# Patient Record
Sex: Female | Born: 1965 | Race: White | Hispanic: No | Marital: Single | Smoking: Former smoker
Health system: Southern US, Community
[De-identification: ages and names within clinical notes are randomized; demographics above are authoritative.]

## PROBLEM LIST (undated history)

## (undated) DIAGNOSIS — C801 Malignant (primary) neoplasm, unspecified: Secondary | ICD-10-CM

## (undated) DIAGNOSIS — IMO0001 Reserved for inherently not codable concepts without codable children: Secondary | ICD-10-CM

## (undated) DIAGNOSIS — IMO0002 Reserved for concepts with insufficient information to code with codable children: Secondary | ICD-10-CM

## (undated) HISTORY — DX: Reserved for inherently not codable concepts without codable children: IMO0001

## (undated) HISTORY — DX: Reserved for concepts with insufficient information to code with codable children: IMO0002

---

## 2014-09-10 ENCOUNTER — Telehealth: Payer: Self-pay | Admitting: Internal Medicine

## 2014-09-10 ENCOUNTER — Emergency Department (HOSPITAL_COMMUNITY): Payer: 59

## 2014-09-10 ENCOUNTER — Inpatient Hospital Stay (HOSPITAL_COMMUNITY)
Admission: EM | Admit: 2014-09-10 | Discharge: 2014-09-30 | DRG: 947 | Disposition: A | Discharge status: Skilled Nursing Facility | Payer: 59 | Attending: Internal Medicine | Admitting: Internal Medicine

## 2014-09-10 ENCOUNTER — Encounter (HOSPITAL_COMMUNITY): Payer: Self-pay | Admitting: *Deleted

## 2014-09-10 DIAGNOSIS — R531 Weakness: Secondary | ICD-10-CM | POA: Insufficient documentation

## 2014-09-10 DIAGNOSIS — Z7189 Other specified counseling: Secondary | ICD-10-CM

## 2014-09-10 DIAGNOSIS — Z801 Family history of malignant neoplasm of trachea, bronchus and lung: Secondary | ICD-10-CM

## 2014-09-10 DIAGNOSIS — C3490 Malignant neoplasm of unspecified part of unspecified bronchus or lung: Secondary | ICD-10-CM | POA: Diagnosis present

## 2014-09-10 DIAGNOSIS — E8770 Fluid overload, unspecified: Secondary | ICD-10-CM | POA: Diagnosis not present

## 2014-09-10 DIAGNOSIS — F17210 Nicotine dependence, cigarettes, uncomplicated: Secondary | ICD-10-CM | POA: Diagnosis present

## 2014-09-10 DIAGNOSIS — R059 Cough, unspecified: Secondary | ICD-10-CM

## 2014-09-10 DIAGNOSIS — R0902 Hypoxemia: Secondary | ICD-10-CM | POA: Insufficient documentation

## 2014-09-10 DIAGNOSIS — Z95828 Presence of other vascular implants and grafts: Secondary | ICD-10-CM

## 2014-09-10 DIAGNOSIS — K5909 Other constipation: Secondary | ICD-10-CM | POA: Diagnosis not present

## 2014-09-10 DIAGNOSIS — K210 Gastro-esophageal reflux disease with esophagitis: Secondary | ICD-10-CM | POA: Diagnosis present

## 2014-09-10 DIAGNOSIS — I480 Paroxysmal atrial fibrillation: Secondary | ICD-10-CM | POA: Diagnosis present

## 2014-09-10 DIAGNOSIS — G893 Neoplasm related pain (acute) (chronic): Secondary | ICD-10-CM | POA: Diagnosis not present

## 2014-09-10 DIAGNOSIS — R630 Anorexia: Secondary | ICD-10-CM | POA: Insufficient documentation

## 2014-09-10 DIAGNOSIS — F419 Anxiety disorder, unspecified: Secondary | ICD-10-CM | POA: Diagnosis present

## 2014-09-10 DIAGNOSIS — I82210 Acute embolism and thrombosis of superior vena cava: Secondary | ICD-10-CM | POA: Diagnosis not present

## 2014-09-10 DIAGNOSIS — Y95 Nosocomial condition: Secondary | ICD-10-CM | POA: Diagnosis present

## 2014-09-10 DIAGNOSIS — G9340 Encephalopathy, unspecified: Secondary | ICD-10-CM | POA: Diagnosis not present

## 2014-09-10 DIAGNOSIS — J9601 Acute respiratory failure with hypoxia: Secondary | ICD-10-CM | POA: Diagnosis not present

## 2014-09-10 DIAGNOSIS — Z66 Do not resuscitate: Secondary | ICD-10-CM | POA: Diagnosis present

## 2014-09-10 DIAGNOSIS — I4891 Unspecified atrial fibrillation: Secondary | ICD-10-CM

## 2014-09-10 DIAGNOSIS — R110 Nausea: Secondary | ICD-10-CM

## 2014-09-10 DIAGNOSIS — Z515 Encounter for palliative care: Secondary | ICD-10-CM | POA: Insufficient documentation

## 2014-09-10 DIAGNOSIS — R918 Other nonspecific abnormal finding of lung field: Secondary | ICD-10-CM

## 2014-09-10 DIAGNOSIS — J9690 Respiratory failure, unspecified, unspecified whether with hypoxia or hypercapnia: Secondary | ICD-10-CM

## 2014-09-10 DIAGNOSIS — D630 Anemia in neoplastic disease: Secondary | ICD-10-CM | POA: Diagnosis present

## 2014-09-10 DIAGNOSIS — R079 Chest pain, unspecified: Secondary | ICD-10-CM

## 2014-09-10 DIAGNOSIS — R0600 Dyspnea, unspecified: Secondary | ICD-10-CM

## 2014-09-10 DIAGNOSIS — C782 Secondary malignant neoplasm of pleura: Secondary | ICD-10-CM | POA: Diagnosis present

## 2014-09-10 DIAGNOSIS — R0602 Shortness of breath: Secondary | ICD-10-CM

## 2014-09-10 DIAGNOSIS — K5903 Drug induced constipation: Secondary | ICD-10-CM

## 2014-09-10 DIAGNOSIS — T380X5A Adverse effect of glucocorticoids and synthetic analogues, initial encounter: Secondary | ICD-10-CM | POA: Diagnosis not present

## 2014-09-10 DIAGNOSIS — C7951 Secondary malignant neoplasm of bone: Secondary | ICD-10-CM | POA: Diagnosis present

## 2014-09-10 DIAGNOSIS — E43 Unspecified severe protein-calorie malnutrition: Secondary | ICD-10-CM | POA: Diagnosis present

## 2014-09-10 DIAGNOSIS — Y842 Radiological procedure and radiotherapy as the cause of abnormal reaction of the patient, or of later complication, without mention of misadventure at the time of the procedure: Secondary | ICD-10-CM | POA: Diagnosis present

## 2014-09-10 DIAGNOSIS — T402X5A Adverse effect of other opioids, initial encounter: Secondary | ICD-10-CM | POA: Diagnosis not present

## 2014-09-10 DIAGNOSIS — T82868A Thrombosis of vascular prosthetic devices, implants and grafts, initial encounter: Secondary | ICD-10-CM | POA: Diagnosis not present

## 2014-09-10 DIAGNOSIS — C3412 Malignant neoplasm of upper lobe, left bronchus or lung: Secondary | ICD-10-CM | POA: Diagnosis present

## 2014-09-10 DIAGNOSIS — R109 Unspecified abdominal pain: Secondary | ICD-10-CM

## 2014-09-10 DIAGNOSIS — Z79899 Other long term (current) drug therapy: Secondary | ICD-10-CM

## 2014-09-10 DIAGNOSIS — E119 Type 2 diabetes mellitus without complications: Secondary | ICD-10-CM | POA: Diagnosis present

## 2014-09-10 DIAGNOSIS — D72829 Elevated white blood cell count, unspecified: Secondary | ICD-10-CM | POA: Diagnosis not present

## 2014-09-10 DIAGNOSIS — Z9221 Personal history of antineoplastic chemotherapy: Secondary | ICD-10-CM

## 2014-09-10 DIAGNOSIS — E871 Hypo-osmolality and hyponatremia: Secondary | ICD-10-CM | POA: Diagnosis present

## 2014-09-10 DIAGNOSIS — R6889 Other general symptoms and signs: Secondary | ICD-10-CM

## 2014-09-10 DIAGNOSIS — K920 Hematemesis: Secondary | ICD-10-CM | POA: Diagnosis not present

## 2014-09-10 DIAGNOSIS — J449 Chronic obstructive pulmonary disease, unspecified: Secondary | ICD-10-CM | POA: Diagnosis present

## 2014-09-10 DIAGNOSIS — R112 Nausea with vomiting, unspecified: Secondary | ICD-10-CM

## 2014-09-10 DIAGNOSIS — Z923 Personal history of irradiation: Secondary | ICD-10-CM

## 2014-09-10 DIAGNOSIS — Z880 Allergy status to penicillin: Secondary | ICD-10-CM

## 2014-09-10 DIAGNOSIS — R05 Cough: Secondary | ICD-10-CM

## 2014-09-10 DIAGNOSIS — C799 Secondary malignant neoplasm of unspecified site: Secondary | ICD-10-CM | POA: Diagnosis present

## 2014-09-10 DIAGNOSIS — E876 Hypokalemia: Secondary | ICD-10-CM | POA: Diagnosis not present

## 2014-09-10 DIAGNOSIS — Z681 Body mass index (BMI) 19 or less, adult: Secondary | ICD-10-CM

## 2014-09-10 DIAGNOSIS — J189 Pneumonia, unspecified organism: Secondary | ICD-10-CM | POA: Insufficient documentation

## 2014-09-10 DIAGNOSIS — K449 Diaphragmatic hernia without obstruction or gangrene: Secondary | ICD-10-CM | POA: Diagnosis present

## 2014-09-10 HISTORY — DX: Malignant (primary) neoplasm, unspecified: C80.1

## 2014-09-10 LAB — HEPATIC FUNCTION PANEL
ALT: 8 U/L (ref 0–35)
AST: 18 U/L (ref 0–37)
Albumin: 3.8 g/dL (ref 3.5–5.2)
Alkaline Phosphatase: 95 U/L (ref 39–117)
Bilirubin, Direct: 0.2 mg/dL (ref 0.0–0.5)
Indirect Bilirubin: 0.4 mg/dL (ref 0.3–0.9)
Total Bilirubin: 0.6 mg/dL (ref 0.3–1.2)
Total Protein: 7.5 g/dL (ref 6.0–8.3)

## 2014-09-10 LAB — BASIC METABOLIC PANEL
Anion gap: 12 (ref 5–15)
BUN: 7 mg/dL (ref 6–23)
CO2: 25 mmol/L (ref 19–32)
Calcium: 9.6 mg/dL (ref 8.4–10.5)
Chloride: 100 mmol/L (ref 96–112)
Creatinine, Ser: 0.7 mg/dL (ref 0.50–1.10)
GFR calc Af Amer: >90 mL/min (ref >90)
Glucose, Bld: 155 mg/dL — ABNORMAL HIGH (ref 70–99)
POTASSIUM: 3.4 mmol/L — AB (ref 3.5–5.1)
SODIUM: 137 mmol/L (ref 135–145)

## 2014-09-10 LAB — CBC
HEMATOCRIT: 42.5 % (ref 36.0–46.0)
Hemoglobin: 14.5 g/dL (ref 12.0–15.0)
MCH: 29.5 pg (ref 26.0–34.0)
MCHC: 34.1 g/dL (ref 30.0–36.0)
MCV: 86.4 fL (ref 78.0–100.0)
Platelets: 358 10*3/uL (ref 150–400)
RBC: 4.92 MIL/uL (ref 3.87–5.11)
RDW: 14.6 % (ref 11.5–15.5)
WBC: 14.2 10*3/uL — ABNORMAL HIGH (ref 4.0–10.5)

## 2014-09-10 LAB — LIPASE, BLOOD: Lipase: 13 U/L (ref 11–59)

## 2014-09-10 LAB — I-STAT TROPONIN, ED: TROPONIN I, POC: 0 ng/mL (ref 0.00–0.08)

## 2014-09-10 MED ORDER — IOHEXOL 300 MG/ML  SOLN
80.0000 mL | Freq: Once | INTRAMUSCULAR | Status: AC | PRN
  Administered 2014-09-10: 80 mL via INTRAVENOUS

## 2014-09-10 MED ORDER — SODIUM CHLORIDE 0.9 % IV SOLN
INTRAVENOUS | Status: DC
  Administered 2014-09-10: 21:00:00 via INTRAVENOUS

## 2014-09-10 MED ORDER — HYDROMORPHONE HCL 1 MG/ML IJ SOLN
1.0000 mg | Freq: Once | INTRAMUSCULAR | Status: AC
  Administered 2014-09-10: 1 mg via INTRAVENOUS
  Filled 2014-09-10: qty 1

## 2014-09-10 MED ORDER — SODIUM CHLORIDE 0.9 % IV BOLUS (SEPSIS)
500.0000 mL | Freq: Once | INTRAVENOUS | Status: AC
  Administered 2014-09-10: 500 mL via INTRAVENOUS

## 2014-09-10 MED ORDER — ONDANSETRON HCL 4 MG/2ML IJ SOLN
4.0000 mg | Freq: Once | INTRAMUSCULAR | Status: AC
  Administered 2014-09-10: 4 mg via INTRAVENOUS
  Filled 2014-09-10: qty 2

## 2014-09-10 MED ORDER — FENTANYL 75 MCG/HR TD PT72
75.0000 ug | MEDICATED_PATCH | TRANSDERMAL | Status: DC
  Administered 2014-09-11 (×2): 75 ug via TRANSDERMAL
  Filled 2014-09-10 (×2): qty 1

## 2014-09-10 MED ORDER — HYDROMORPHONE HCL 1 MG/ML IJ SOLN
2.0000 mg | Freq: Once | INTRAMUSCULAR | Status: AC
  Administered 2014-09-10: 2 mg via INTRAVENOUS
  Filled 2014-09-10: qty 2

## 2014-09-10 NOTE — Telephone Encounter (Signed)
Chart delivered 09-10-14

## 2014-09-10 NOTE — ED Notes (Addendum)
Pt contacted EMS today d/t CP left sided radiating to back. 10/10. She has a hx carcinoma left bronchioles. SOB , diminished LLL, She has a port right chest. Fentanyl 250 mcg, Albuterol 5mg , left AC 20G. Fentanyl patch removed By EMS

## 2014-09-10 NOTE — ED Notes (Signed)
Pt aware sample needed. She reports she is in pain at this time and unable

## 2014-09-10 NOTE — Telephone Encounter (Signed)
Called pt left vm in ref to np appt. On 09/22/44@1 :30 Referring  [facility] Dx-lung ca

## 2014-09-10 NOTE — ED Notes (Signed)
FAMILY CONTACT INFO  Gevena Cotton (brother) [phone]

## 2014-09-10 NOTE — ED Notes (Signed)
Pt has been told a urine sample is needed from her, says she does not have to go right now

## 2014-09-11 DIAGNOSIS — C799 Secondary malignant neoplasm of unspecified site: Secondary | ICD-10-CM | POA: Diagnosis not present

## 2014-09-11 DIAGNOSIS — R630 Anorexia: Secondary | ICD-10-CM | POA: Diagnosis not present

## 2014-09-11 DIAGNOSIS — C3490 Malignant neoplasm of unspecified part of unspecified bronchus or lung: Secondary | ICD-10-CM | POA: Diagnosis present

## 2014-09-11 DIAGNOSIS — G893 Neoplasm related pain (acute) (chronic): Secondary | ICD-10-CM | POA: Diagnosis not present

## 2014-09-11 DIAGNOSIS — C3492 Malignant neoplasm of unspecified part of left bronchus or lung: Secondary | ICD-10-CM | POA: Diagnosis not present

## 2014-09-11 LAB — PROTIME-INR
INR: 1.03 (ref 0.00–1.49)
Prothrombin Time: 13.6 seconds (ref 11.6–15.2)

## 2014-09-11 LAB — COMPREHENSIVE METABOLIC PANEL
ALK PHOS: 86 U/L (ref 39–117)
ALT: 8 U/L (ref 0–35)
AST: 12 U/L (ref 0–37)
Albumin: 3.4 g/dL — ABNORMAL LOW (ref 3.5–5.2)
Anion gap: 10 (ref 5–15)
BILIRUBIN TOTAL: 0.9 mg/dL (ref 0.3–1.2)
BUN: 6 mg/dL (ref 6–23)
CHLORIDE: 103 mmol/L (ref 96–112)
CO2: 24 mmol/L (ref 19–32)
Calcium: 9 mg/dL (ref 8.4–10.5)
Creatinine, Ser: 0.6 mg/dL (ref 0.50–1.10)
GFR calc non Af Amer: >90 mL/min (ref >90)
GLUCOSE: 120 mg/dL — AB (ref 70–99)
POTASSIUM: 3.6 mmol/L (ref 3.5–5.1)
SODIUM: 137 mmol/L (ref 135–145)
Total Protein: 6.7 g/dL (ref 6.0–8.3)

## 2014-09-11 LAB — CBC
HCT: 38.9 % (ref 36.0–46.0)
HEMOGLOBIN: 12.7 g/dL (ref 12.0–15.0)
MCH: 28.3 pg (ref 26.0–34.0)
MCHC: 32.6 g/dL (ref 30.0–36.0)
MCV: 86.8 fL (ref 78.0–100.0)
Platelets: 442 10*3/uL — ABNORMAL HIGH (ref 150–400)
RBC: 4.48 MIL/uL (ref 3.87–5.11)
RDW: 14.5 % (ref 11.5–15.5)
WBC: 15.2 10*3/uL — ABNORMAL HIGH (ref 4.0–10.5)

## 2014-09-11 LAB — TROPONIN I
Troponin I: <0.03 ng/mL (ref <0.031)
Troponin I: <0.03 ng/mL (ref <0.031)
Troponin I: <0.03 ng/mL (ref <0.031)

## 2014-09-11 MED ORDER — OXYCODONE HCL 5 MG PO TABS
30.0000 mg | ORAL_TABLET | Freq: Three times a day (TID) | ORAL | Status: DC
  Administered 2014-09-11 – 2014-09-12 (×2): 30 mg via ORAL
  Filled 2014-09-11 (×2): qty 6

## 2014-09-11 MED ORDER — SODIUM CHLORIDE 0.9 % IJ SOLN
3.0000 mL | Freq: Two times a day (BID) | INTRAMUSCULAR | Status: DC
  Administered 2014-09-11 – 2014-09-26 (×17): 3 mL via INTRAVENOUS

## 2014-09-11 MED ORDER — METHYLPREDNISOLONE 4 MG PO KIT: 4.0000 mg | PACK | ORAL | Status: DC

## 2014-09-11 MED ORDER — ONDANSETRON HCL 4 MG/2ML IJ SOLN
4.0000 mg | Freq: Four times a day (QID) | INTRAMUSCULAR | Status: DC | PRN
Start: 2014-09-11 — End: 2014-09-12
  Administered 2014-09-11 – 2014-09-12 (×3): 4 mg via INTRAVENOUS
  Filled 2014-09-11 (×3): qty 2

## 2014-09-11 MED ORDER — GUAIFENESIN-DM 100-10 MG/5ML PO SYRP
5.0000 mL | ORAL_SOLUTION | ORAL | Status: DC | PRN
  Administered 2014-09-11 – 2014-09-14 (×5): 5 mL via ORAL
  Filled 2014-09-11 (×5): qty 10

## 2014-09-11 MED ORDER — METHYLPREDNISOLONE 4 MG PO KIT
8.0000 mg | PACK | Freq: Every morning | ORAL | Status: AC
  Administered 2014-09-11: 8 mg via ORAL
  Filled 2014-09-11: qty 21

## 2014-09-11 MED ORDER — METHYLPREDNISOLONE 4 MG PO KIT: 8.0000 mg | PACK | Freq: Every evening | ORAL | Status: DC

## 2014-09-11 MED ORDER — ACETAMINOPHEN 325 MG PO TABS: 650.0000 mg | ORAL_TABLET | Freq: Four times a day (QID) | ORAL | Status: DC | PRN

## 2014-09-11 MED ORDER — SODIUM CHLORIDE 0.9 % IV SOLN: INTRAVENOUS | Status: DC

## 2014-09-11 MED ORDER — MORPHINE SULFATE ER 15 MG PO TBCR
15.0000 mg | EXTENDED_RELEASE_TABLET | Freq: Two times a day (BID) | ORAL | Status: DC
  Administered 2014-09-11 (×2): 15 mg via ORAL
  Filled 2014-09-11 (×2): qty 1

## 2014-09-11 MED ORDER — KETOROLAC TROMETHAMINE 15 MG/ML IJ SOLN
15.0000 mg | Freq: Four times a day (QID) | INTRAMUSCULAR | Status: DC | PRN
  Administered 2014-09-12: 15 mg via INTRAVENOUS
  Filled 2014-09-11: qty 1

## 2014-09-11 MED ORDER — ACETAMINOPHEN 650 MG RE SUPP: 650.0000 mg | Freq: Four times a day (QID) | RECTAL | Status: DC | PRN

## 2014-09-11 MED ORDER — SENNOSIDES-DOCUSATE SODIUM 8.6-50 MG PO TABS
1.0000 | ORAL_TABLET | Freq: Every day | ORAL | Status: DC
  Administered 2014-09-11 – 2014-09-29 (×17): 1 via ORAL
  Filled 2014-09-11 (×18): qty 1

## 2014-09-11 MED ORDER — CALCIUM CARBONATE ANTACID 500 MG PO CHEW
1.0000 | CHEWABLE_TABLET | Freq: Four times a day (QID) | ORAL | Status: DC | PRN
  Administered 2014-09-30: 200 mg via ORAL
  Filled 2014-09-11 (×2): qty 1

## 2014-09-11 MED ORDER — METHYLPREDNISOLONE 4 MG PO KIT: 4.0000 mg | PACK | Freq: Four times a day (QID) | ORAL | Status: DC

## 2014-09-11 MED ORDER — POLYETHYLENE GLYCOL 3350 17 G PO PACK
17.0000 g | PACK | Freq: Every day | ORAL | Status: DC
  Administered 2014-09-14 – 2014-09-27 (×12): 17 g via ORAL
  Filled 2014-09-11 (×18): qty 1

## 2014-09-11 MED ORDER — ONDANSETRON HCL 4 MG PO TABS: 4.0000 mg | ORAL_TABLET | Freq: Four times a day (QID) | ORAL | Status: DC | PRN

## 2014-09-11 MED ORDER — ALUM & MAG HYDROXIDE-SIMETH 200-200-20 MG/5ML PO SUSP
30.0000 mL | ORAL | Status: DC | PRN
  Administered 2014-09-11: 30 mL via ORAL
  Filled 2014-09-11: qty 30

## 2014-09-11 MED ORDER — METHYLPREDNISOLONE 4 MG PO KIT: 4.0000 mg | PACK | Freq: Three times a day (TID) | ORAL | Status: DC

## 2014-09-11 MED ORDER — OXYCODONE HCL 5 MG PO TABS
30.0000 mg | ORAL_TABLET | Freq: Three times a day (TID) | ORAL | Status: DC | PRN
Start: 2014-09-11 — End: 2014-09-11
  Administered 2014-09-11 (×2): 30 mg via ORAL
  Filled 2014-09-11 (×2): qty 6

## 2014-09-11 MED ORDER — DULOXETINE HCL 30 MG PO CPEP
30.0000 mg | ORAL_CAPSULE | Freq: Every day | ORAL | Status: DC
  Administered 2014-09-11: 30 mg via ORAL
  Filled 2014-09-11: qty 1

## 2014-09-11 MED ORDER — CETYLPYRIDINIUM CHLORIDE 0.05 % MT LIQD
7.0000 mL | Freq: Two times a day (BID) | OROMUCOSAL | Status: DC
  Administered 2014-09-11 – 2014-09-30 (×31): 7 mL via OROMUCOSAL

## 2014-09-11 MED ORDER — HEPARIN SODIUM (PORCINE) 5000 UNIT/ML IJ SOLN
5000.0000 [IU] | Freq: Three times a day (TID) | INTRAMUSCULAR | Status: DC
  Administered 2014-09-11 – 2014-09-12 (×4): 5000 [IU] via SUBCUTANEOUS
  Filled 2014-09-11 (×4): qty 1

## 2014-09-11 MED ORDER — MEGESTROL ACETATE 400 MG/10ML PO SUSP
400.0000 mg | Freq: Two times a day (BID) | ORAL | Status: DC
  Administered 2014-09-11 – 2014-09-29 (×29): 400 mg via ORAL
  Filled 2014-09-11 (×39): qty 10

## 2014-09-11 MED ORDER — NAPROXEN SODIUM 550 MG PO TABS
550.0000 mg | ORAL_TABLET | Freq: Two times a day (BID) | ORAL | Status: DC
Start: 2014-09-11 — End: 2014-09-11
  Administered 2014-09-11 (×2): 550 mg via ORAL
  Filled 2014-09-11 (×3): qty 1

## 2014-09-11 MED ORDER — HYDROMORPHONE HCL 1 MG/ML IJ SOLN
1.0000 mg | INTRAMUSCULAR | Status: DC | PRN
  Administered 2014-09-11 – 2014-09-21 (×26): 1 mg via INTRAVENOUS
  Filled 2014-09-11 (×28): qty 1

## 2014-09-11 MED ORDER — FENTANYL 75 MCG/HR TD PT72: 75.0000 ug | MEDICATED_PATCH | TRANSDERMAL | Status: DC

## 2014-09-11 NOTE — Progress Notes (Signed)
Patients brother called and updated that patient was being admitted and had a bed R[place] Registered Nurse,  Emergency Department

## 2014-09-11 NOTE — Progress Notes (Signed)
Patient admitted early this AM for uncontrolled pain which was found to be secondary to Lung CA recurrence. She as crying this AM due to pain when I evaluated her. Stated that the Fentanyl patch had been removed by someone. The RN and I saw no patch on her body. The RN was not given a report of the patch being removed. Have asked RN to obtain another patch from pharmacy and place. Added MS Contin as well. She was given IV Dilaudid for her pain which I will continue for break through until the long acting meds attain steady state.  Spoke with Sharene Butters from Oncology today. Dr Alvy Bimler who is on call is recommending the patient see Dr Julien Nordmann who is the lung cancer specialist. He is currently out of town. I have asked Clarise Cruz to make an appt for the patient to see him as soon as possible.  Debbe Odea, MD [facility] Pager: [email or web address]

## 2014-09-11 NOTE — H&P (Signed)
[facility] History and Physical  Patient: [name]  MRN: [id number]  DOB: 07-25-1965  DOS: the patient was seen and examined on 09/11/2014 PCP: No primary care provider on file.  Chief Complaint: Chest pain  HPI: [name] is a 49 y.o. female with Past medical history of left lung cancer status post chemoradiation September 2015. The patient is presenting with complaints of left-sided chest pain. Patient has history of left-sided lung cancer of unknown etiology at present and was in [facility] initially where her primary treatment has been completed. She has undergone several rounds of chemotherapy as well as radiation. After that she has been having chronic recurrent chest pain and has been taking increasing dose of narcotics. She has multiple admission in the hospital. Last admission was a few weeks ago and [place]. After that the patient was brought here by family due to lack of support there. Patient has been complaining of chest pain on the left side which is sharp and pleuritic in worsening with deep breathing. Denies any fever denies any cough. Denies any shortness of breath. Dose of use any oxygen at baseline. No choking episode. No leg pain or leg tenderness. No recent change in medications.  The patient is coming from home And at her baseline independent for most of her ADL.  Review of Systems: as mentioned in the history of present illness.  A Comprehensive review of the other systems is negative.  Past Medical History  Diagnosis Date  . Cancer     left bronchioles   History reviewed. No pertinent past surgical history. Social History:  reports that she has quit smoking. She does not have any smokeless tobacco history on file. She reports that she does not drink alcohol or use illicit drugs.  Allergies  Allergen Reactions  . Penicillins Rash    History reviewed. No pertinent family history.  Prior to Admission medications    Medication Sig Start Date End Date Taking? Authorizing Provider  fentaNYL (DURAGESIC - DOSED MCG/HR) 75 MCG/HR Place 75 mcg onto the skin every 3 (three) days. 09/04/14  Yes Historical Provider, MD  oxycodone (ROXICODONE) 30 MG immediate release tablet Take 1 tablet by mouth every 8 (eight) hours as needed for pain.  08/30/14  Yes Historical Provider, MD    Physical Exam: Filed Vitals:   09/10/14 2107 09/11/14 0005 09/11/14 0214 09/11/14 0555  BP: 128/89 107/69 122/78 127/95  Pulse: 97 94 100 96  Temp:   98.1 F (36.7 C) 98.2 F (36.8 C)  TempSrc:   Oral Oral  Resp: 21 19 16 18   Height:   5\' 7"  (1.702 m)   Weight:   58.65 kg (129 lb 4.8 oz)   SpO2: 100% 99% 100% 99%    General: Alert, Awake and Oriented to Time, Place and Person. Appear in mild distress Eyes: PERRL ENT: Oral Mucosa clear moist. Neck: no JVD Cardiovascular: S1 and S2 Present, no Murmur, Peripheral Pulses Present Respiratory: Bilateral Air entry equal and Decreased, Clear to Auscultatio no wheezes Abdomen: Bowel Sound present, Soft and non tender Skin: no Rash Extremities: no Pedal edema, no calf tenderness Neurologic: Grossly no focal neuro deficit appears anxious  Labs on Admission:  CBC:  Recent Labs Lab 09/10/14 1915 09/11/14 0335  WBC 14.2* 15.2*  HGB 14.5 12.7  HCT 42.5 38.9  MCV 86.4 86.8  PLT 358 442*    CMP     Component Value Date/Time   NA 137 09/11/2014 0335   K 3.6  09/11/2014 0335   CL 103 09/11/2014 0335   CO2 24 09/11/2014 0335   GLUCOSE 120* 09/11/2014 0335   BUN 6 09/11/2014 0335   CREATININE 0.60 09/11/2014 0335   CALCIUM 9.0 09/11/2014 0335   PROT 6.7 09/11/2014 0335   ALBUMIN 3.4* 09/11/2014 0335   AST 12 09/11/2014 0335   ALT 8 09/11/2014 0335   ALKPHOS 86 09/11/2014 0335   BILITOT 0.9 09/11/2014 0335   GFRNONAA >90 09/11/2014 0335   GFRAA >90 09/11/2014 0335     Recent Labs Lab 09/10/14 1915  LIPASE 13     Recent Labs Lab 09/11/14 0335  TROPONINI <0.03    BNP (last 3 results) No results for input(s): BNP in the last 8760 hours.  ProBNP (last 3 results) No results for input(s): PROBNP in the last 8760 hours.   Radiological Exams on Admission: Ct Chest W Contrast  09/11/2014   CLINICAL DATA:  Left-sided chest pain radiating to the back. 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  EXAM: CT CHEST WITH CONTRAST  TECHNIQUE: Multidetector CT imaging of the chest was performed during intravenous contrast administration.  CONTRAST:  107mL OMNIPAQUE IOHEXOL 300 MG/ML  SOLN  COMPARISON:  Chest radiograph earlier this day. No remote exams available for comparison.  FINDINGS: Right chest port with tip in the proximal SVC. There is volume loss in the left hemithorax with short this segment of marked luminal narrowing involving the left mainstem bronchus at the left hilum. Soft tissue density is noted adjacent to the right and left bronchi at the hilum. There is paramediastinal soft tissue density in the left upper lobe that has heterogeneous enhancement. Within the left upper lobe this a pleural-based bilobed 1.6 x 1.6 cm nodule. Reticular opacities at the left lung apex with associated apical thickening. Minimal linear opacities in the left lower lobe, with irregular opacity at the costophrenic angle. There is a small partially loculated left pleural effusion.  No right-sided tiny subpleural nodule opacities at the lung apex measuring 4 mm. There are otherwise no right pulmonary nodules. Emphysematous changes noted. There is mild paramediastinal fibrosis in the right lung that may be radiation change.  There is a 2.3 x 1.4 cm probable necrotic lymph node in the AP window. Minimal soft tissue density in the left hilum. There is otherwise no mediastinal adenopathy. Small pericardial effusion primarily adjacent to the right atrium. No pericardial nodularity or enhancement  The heart size is normal. The thoracic aorta is normal in caliber without  dissection.  Evaluation of the upper abdomen demonstrates no acute abnormality. There are no adrenal nodules. No focal hepatic lesion in the included liver.  Lobular lucent partially expansile lesion with and posterior left ninth rib concerning for metastasis. No pathologic fracture. No definite additional focal osseous lesion.  IMPRESSION: 1. Volume loss in the left hemithorax with paramediastinal soft tissue density. There is para-mediastinal reticular change. Findings suggest posttreatment related change related to lung cancer, however no prior exams are available for comparison and correlation with history is recommended. There is a bilobed 1.6 cm pleural based nodule in the left upper lobe concerning for malignancy. Probable necrotic lymph node in the AP window. 2. Lucent lesion in left posterior ninth rib concerning for bony metastasis. 3. Underlying emphysema. Tiny subpleural nodularity at the right lung apex is nonspecific, suspect scarring, again correlation with prior imaging recommended.   Electronically Signed   By: Jeb Levering M.D.   On: 09/11/2014 00:30   Dg Chest Port 1  View  09/10/2014   CLINICAL DATA:  Left-sided chest pain.  Lung carcinoma  EXAM: PORTABLE CHEST - 1 VIEW  COMPARISON:  None.  FINDINGS: There is elevation of the left hemidiaphragm. There is a small left pleural effusion with atelectatic change in left base. There is ill-defined opacity in the left upper lobe, probably representing pneumonia, although mass in this area is a differential consideration. Right lung is clear. Heart size is normal. Pulmonary vascularity is within normal limits. No adenopathy. Port-A-Cath tip is in the superior vena cava. No pneumothorax evident.  IMPRESSION: Opacity left upper lobe. Probable pneumonia, although mass in this area is a differential consideration. Small left effusion. Volume loss on left. Right lung clear.   Electronically Signed   By: Lowella Grip III M.D.   On: 09/10/2014 19:35     EKG: Independently reviewed. normal sinus rhythm, nonspecific ST and T waves changes.  Assessment/Plan Principal Problem:   Cancer associated pain Active Problems:   Lung cancer   Anorexia   Metastasis   1. Cancer associated pain The patient is presenting with complaints of chest pain which is located on the left side. The pain is pleuritic in nature. Pain has been present for last many months and progressively worsening over last few weeks. The patient is currently using fentanyl patch as well as oxycodone 30 mg for pain management. She will be admitted in the hospital for pain management after discussion with oncology. Oncology will be following up with the patient in the morning. Patient will be started on scheduled DuoNeb Jessica patch as well as naproxen. She will also be started on Cymbalta 30 mg daily.  2. Anorexia. Patient will be placed on Medrol pack. The steroids might also help with current pain management.  3. Metastatic lung cancer. Oncology will be following up with the patient. Unclear of patient's initial diagnosis stage as well as further information about treatment.  Advance goals of care discussion: Full code   Consults: Oncology  DVT Prophylaxis: subcutaneous Heparin. Nutrition: Regular diet  Disposition: Admitted to observation  in telemetry unit.  Author: Berle Mull, MD [facility] Pager: [phone] 09/11/2014, 6:38 AM    If 7PM-7AM, please contact night-coverage [email or web address] Password [email or web address]

## 2014-09-11 NOTE — Progress Notes (Signed)
UR completed 

## 2014-09-11 NOTE — ED Provider Notes (Signed)
CSN: [id number]     Arrival date & time 09/10/14  1914 History   First MD Initiated Contact with Patient 09/10/14 1850     Chief Complaint  Patient presents with  . Chest Pain     (Consider location/radiation/quality/duration/timing/severity/associated sxs/prior Treatment) Patient is a 49 y.o. female presenting with chest pain. The history is provided by the patient and a relative.  Chest Pain Associated symptoms: back pain and shortness of breath   Associated symptoms: no abdominal pain, no fever, no headache, no nausea and not vomiting    patient arrived initially by EMS without family members present. Was complaining of left-sided chest pain radiating to the back 10 out of 10. Patient states that she's got carcinoma of the left lung. That is recurrent. Patient states her some shortness of breath. All symptoms have been present for several weeks. The pain is been severe at times on and off. Patient is on a Duragesic patch and Roxicodone states she just gets only minimal relief with that.  Patient denies any specific new symptoms that haven't been present for several weeks. Patient appears very uncomfortable and appears to be in severe pain. EMS gave patient sentinel in route.  Past Medical History  Diagnosis Date  . Cancer     left bronchioles   History reviewed. No pertinent past surgical history. History reviewed. No pertinent family history. History  Substance Use Topics  . Smoking status: Former Research scientist (life sciences)  . Smokeless tobacco: Not on file  . Alcohol Use: No   OB History    No data available     Review of Systems  Constitutional: Negative for fever.  HENT: Negative for congestion.   Eyes: Negative for visual disturbance.  Respiratory: Positive for shortness of breath.   Cardiovascular: Positive for chest pain.  Gastrointestinal: Negative for nausea, vomiting and abdominal pain.  Musculoskeletal: Positive for back pain.  Skin: Negative for rash.  Neurological: Negative  for headaches.  Hematological: Does not bruise/bleed easily.  Psychiatric/Behavioral: Negative for confusion.      Allergies  Penicillins  Home Medications   Prior to Admission medications   Medication Sig Start Date End Date Taking? Authorizing Provider  fentaNYL (DURAGESIC - DOSED MCG/HR) 75 MCG/HR Place 75 mcg onto the skin every 3 (three) days. 09/04/14  Yes Historical Provider, MD  oxycodone (ROXICODONE) 30 MG immediate release tablet Take 1 tablet by mouth every 8 (eight) hours as needed for pain.  08/30/14  Yes Historical Provider, MD   BP 107/69 mmHg  Pulse 94  Temp(Src) 97.7 F (36.5 C) (Oral)  Resp 19  SpO2 99% Physical Exam  Constitutional: She is oriented to person, place, and time. She appears well-developed. She appears distressed.  Thin but not cachectic  HENT:  Head: Normocephalic and atraumatic.  Mucous membranes dry  Eyes: Conjunctivae and EOM are normal. Pupils are equal, round, and reactive to light.  Neck: Normal range of motion.  Cardiovascular: Normal rate and regular rhythm.   No murmur heard. Pulmonary/Chest: Effort normal. No respiratory distress. She has no wheezes. She has no rales.  Abdominal: Soft. Bowel sounds are normal. There is no tenderness.  Musculoskeletal: Normal range of motion. She exhibits no edema.  Neurological: She is alert and oriented to person, place, and time. No cranial nerve deficit. She exhibits normal muscle tone. Coordination normal.  Skin: Skin is warm.  Nursing note and vitals reviewed.   ED Course  Procedures (including critical care time) Labs Review Labs Reviewed  CBC - Abnormal; Notable  for the following:    WBC 14.2 (*)    All other components within normal limits  BASIC METABOLIC PANEL - Abnormal; Notable for the following:    Potassium 3.4 (*)    Glucose, Bld 155 (*)    All other components within normal limits  HEPATIC FUNCTION PANEL  LIPASE, BLOOD  URINALYSIS, ROUTINE W REFLEX MICROSCOPIC  I-STAT  TROPOININ, ED   Results for orders placed or performed during the hospital encounter of 09/10/14  CBC  Result Value Ref Range   WBC 14.2 (H) 4.0 - 10.5 K/uL   RBC 4.92 3.87 - 5.11 MIL/uL   Hemoglobin 14.5 12.0 - 15.0 g/dL   HCT 42.5 36.0 - 46.0 %   MCV 86.4 78.0 - 100.0 fL   MCH 29.5 26.0 - 34.0 pg   MCHC 34.1 30.0 - 36.0 g/dL   RDW 14.6 11.5 - 15.5 %   Platelets 358 150 - 400 K/uL  Basic metabolic panel  Result Value Ref Range   Sodium 137 135 - 145 mmol/L   Potassium 3.4 (L) 3.5 - 5.1 mmol/L   Chloride 100 96 - 112 mmol/L   CO2 25 19 - 32 mmol/L   Glucose, Bld 155 (H) 70 - 99 mg/dL   BUN 7 6 - 23 mg/dL   Creatinine, Ser 0.70 0.50 - 1.10 mg/dL   Calcium 9.6 8.4 - 10.5 mg/dL   GFR calc non Af Amer >90 >90 mL/min   GFR calc Af Amer >90 >90 mL/min   Anion gap 12 5 - 15  Hepatic function panel  Result Value Ref Range   Total Protein 7.5 6.0 - 8.3 g/dL   Albumin 3.8 3.5 - 5.2 g/dL   AST 18 0 - 37 U/L   ALT 8 0 - 35 U/L   Alkaline Phosphatase 95 39 - 117 U/L   Total Bilirubin 0.6 0.3 - 1.2 mg/dL   Bilirubin, Direct 0.2 0.0 - 0.5 mg/dL   Indirect Bilirubin 0.4 0.3 - 0.9 mg/dL  Lipase, blood  Result Value Ref Range   Lipase 13 11 - 59 U/L  I-stat troponin, ED (not at [facility])  Result Value Ref Range   Troponin i, poc 0.00 0.00 - 0.08 ng/mL   Comment 3             Imaging Review Ct Chest W Contrast  09/11/2014   CLINICAL DATA:  Left-sided chest pain radiating to the back. 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  EXAM: CT CHEST WITH CONTRAST  TECHNIQUE: Multidetector CT imaging of the chest was performed during intravenous contrast administration.  CONTRAST:  27mL OMNIPAQUE IOHEXOL 300 MG/ML  SOLN  COMPARISON:  Chest radiograph earlier this day. No remote exams available for comparison.  FINDINGS: Right chest port with tip in the proximal SVC. There is volume loss in the left hemithorax with short this segment of marked luminal narrowing involving the left  mainstem bronchus at the left hilum. Soft tissue density is noted adjacent to the right and left bronchi at the hilum. There is paramediastinal soft tissue density in the left upper lobe that has heterogeneous enhancement. Within the left upper lobe this a pleural-based bilobed 1.6 x 1.6 cm nodule. Reticular opacities at the left lung apex with associated apical thickening. Minimal linear opacities in the left lower lobe, with irregular opacity at the costophrenic angle. There is a small partially loculated left pleural effusion.  No right-sided tiny subpleural nodule opacities at the lung apex measuring 4 mm.  There are otherwise no right pulmonary nodules. Emphysematous changes noted. There is mild paramediastinal fibrosis in the right lung that may be radiation change.  There is a 2.3 x 1.4 cm probable necrotic lymph node in the AP window. Minimal soft tissue density in the left hilum. There is otherwise no mediastinal adenopathy. Small pericardial effusion primarily adjacent to the right atrium. No pericardial nodularity or enhancement  The heart size is normal. The thoracic aorta is normal in caliber without dissection.  Evaluation of the upper abdomen demonstrates no acute abnormality. There are no adrenal nodules. No focal hepatic lesion in the included liver.  Lobular lucent partially expansile lesion with and posterior left ninth rib concerning for metastasis. No pathologic fracture. No definite additional focal osseous lesion.  IMPRESSION: 1. Volume loss in the left hemithorax with paramediastinal soft tissue density. There is para-mediastinal reticular change. Findings suggest posttreatment related change related to lung cancer, however no prior exams are available for comparison and correlation with history is recommended. There is a bilobed 1.6 cm pleural based nodule in the left upper lobe concerning for malignancy. Probable necrotic lymph node in the AP window. 2. Lucent lesion in left posterior ninth  rib concerning for bony metastasis. 3. Underlying emphysema. Tiny subpleural nodularity at the right lung apex is nonspecific, suspect scarring, again correlation with prior imaging recommended.   Electronically Signed   By: Jeb Levering M.D.   On: 09/11/2014 00:30   Dg Chest Port 1 View  09/10/2014   CLINICAL DATA:  Left-sided chest pain.  Lung carcinoma  EXAM: PORTABLE CHEST - 1 VIEW  COMPARISON:  None.  FINDINGS: There is elevation of the left hemidiaphragm. There is a small left pleural effusion with atelectatic change in left base. There is ill-defined opacity in the left upper lobe, probably representing pneumonia, although mass in this area is a differential consideration. Right lung is clear. Heart size is normal. Pulmonary vascularity is within normal limits. No adenopathy. Port-A-Cath tip is in the superior vena cava. No pneumothorax evident.  IMPRESSION: Opacity left upper lobe. Probable pneumonia, although mass in this area is a differential consideration. Small left effusion. Volume loss on left. Right lung clear.   Electronically Signed   By: Lowella Grip III M.D.   On: 09/10/2014 19:35     EKG Interpretation   Date/Time:  Wednesday September 10 2014 18:43:32 EDT Ventricular Rate:  110 PR Interval:  143 QRS Duration: 86 QT Interval:  354 QTC Calculation: 479 R Axis:   96 Text Interpretation:  Right and left arm electrode reversal,  interpretation assumes no reversal Sinus tachycardia Right atrial  enlargement Borderline right axis deviation Nonspecific T abnormalities,  lateral leads No previous ECGs available Confirmed by Brandii Lakey  MD, Vickee Mormino  [phone]) on 09/10/2014 6:50:20 PM      MDM   Final diagnoses:  Chest pain  Malignant neoplasm of lung, unspecified laterality, unspecified part of lung    The patient recently brought to the area from [facility] known to have metastatic or recurrent lung cancer. Patient has been having significant problems with left chest pain  even in [place] for the past several weeks multiple admissions to the emergency department and some admissions to the hospital. Family had contacted the hematology oncology here Dr. Earlie Server and patient was to follow-up with him they have contacted him since she arrived but have not gotten an appointment time yet. Patient not eating well not eating hardly at all patient not drinking well patient with times  severe uncontrollable pain in the left lower part of the chest.  CT scan shows abnormalities in that area but no distinct no pneumonia. Not concerned about pulmonary embolus since pain problem is been in that area for several weeks. Patient also not hypoxic. Patient will get temporary improvement with pain with multiple doses of Dilaudid. Unfortunately patient's sentinel patch was removed by EMS and we didn't take her that out until recently. That has been replaced. Patient a little bit more comfortable. Patient's family of really doesn't know what to do with her and was hoping for admission.  Discussed with the on-call [facility] oncology Dr. Rozetta Nunnery and they are recommending admission. By the hospitalist team. For pain control hydration and for oncology to evaluate the patient.    Fredia Sorrow, MD 09/11/14 [phone]

## 2014-09-12 DIAGNOSIS — T380X5A Adverse effect of glucocorticoids and synthetic analogues, initial encounter: Secondary | ICD-10-CM | POA: Diagnosis not present

## 2014-09-12 DIAGNOSIS — Z86718 Personal history of other venous thrombosis and embolism: Secondary | ICD-10-CM | POA: Diagnosis not present

## 2014-09-12 DIAGNOSIS — D630 Anemia in neoplastic disease: Secondary | ICD-10-CM | POA: Diagnosis present

## 2014-09-12 DIAGNOSIS — Z923 Personal history of irradiation: Secondary | ICD-10-CM | POA: Diagnosis not present

## 2014-09-12 DIAGNOSIS — R079 Chest pain, unspecified: Secondary | ICD-10-CM | POA: Diagnosis present

## 2014-09-12 DIAGNOSIS — K920 Hematemesis: Secondary | ICD-10-CM | POA: Diagnosis not present

## 2014-09-12 DIAGNOSIS — F17210 Nicotine dependence, cigarettes, uncomplicated: Secondary | ICD-10-CM | POA: Diagnosis present

## 2014-09-12 DIAGNOSIS — D72829 Elevated white blood cell count, unspecified: Secondary | ICD-10-CM | POA: Diagnosis not present

## 2014-09-12 DIAGNOSIS — Z681 Body mass index (BMI) 19 or less, adult: Secondary | ICD-10-CM | POA: Diagnosis not present

## 2014-09-12 DIAGNOSIS — F419 Anxiety disorder, unspecified: Secondary | ICD-10-CM | POA: Diagnosis present

## 2014-09-12 DIAGNOSIS — I82290 Acute embolism and thrombosis of other thoracic veins: Secondary | ICD-10-CM | POA: Diagnosis not present

## 2014-09-12 DIAGNOSIS — C782 Secondary malignant neoplasm of pleura: Secondary | ICD-10-CM | POA: Diagnosis present

## 2014-09-12 DIAGNOSIS — C3412 Malignant neoplasm of upper lobe, left bronchus or lung: Secondary | ICD-10-CM | POA: Diagnosis present

## 2014-09-12 DIAGNOSIS — R531 Weakness: Secondary | ICD-10-CM | POA: Diagnosis not present

## 2014-09-12 DIAGNOSIS — Y95 Nosocomial condition: Secondary | ICD-10-CM | POA: Diagnosis present

## 2014-09-12 DIAGNOSIS — Z66 Do not resuscitate: Secondary | ICD-10-CM | POA: Diagnosis present

## 2014-09-12 DIAGNOSIS — C3490 Malignant neoplasm of unspecified part of unspecified bronchus or lung: Secondary | ICD-10-CM

## 2014-09-12 DIAGNOSIS — Z515 Encounter for palliative care: Secondary | ICD-10-CM | POA: Diagnosis not present

## 2014-09-12 DIAGNOSIS — K449 Diaphragmatic hernia without obstruction or gangrene: Secondary | ICD-10-CM | POA: Diagnosis present

## 2014-09-12 DIAGNOSIS — R110 Nausea: Secondary | ICD-10-CM

## 2014-09-12 DIAGNOSIS — T82868A Thrombosis of vascular prosthetic devices, implants and grafts, initial encounter: Secondary | ICD-10-CM | POA: Diagnosis not present

## 2014-09-12 DIAGNOSIS — I480 Paroxysmal atrial fibrillation: Secondary | ICD-10-CM | POA: Diagnosis present

## 2014-09-12 DIAGNOSIS — E871 Hypo-osmolality and hyponatremia: Secondary | ICD-10-CM | POA: Diagnosis present

## 2014-09-12 DIAGNOSIS — Z801 Family history of malignant neoplasm of trachea, bronchus and lung: Secondary | ICD-10-CM | POA: Diagnosis not present

## 2014-09-12 DIAGNOSIS — Y842 Radiological procedure and radiotherapy as the cause of abnormal reaction of the patient, or of later complication, without mention of misadventure at the time of the procedure: Secondary | ICD-10-CM | POA: Diagnosis present

## 2014-09-12 DIAGNOSIS — R630 Anorexia: Secondary | ICD-10-CM | POA: Diagnosis not present

## 2014-09-12 DIAGNOSIS — E119 Type 2 diabetes mellitus without complications: Secondary | ICD-10-CM | POA: Diagnosis present

## 2014-09-12 DIAGNOSIS — K5909 Other constipation: Secondary | ICD-10-CM | POA: Diagnosis not present

## 2014-09-12 DIAGNOSIS — I82210 Acute embolism and thrombosis of superior vena cava: Secondary | ICD-10-CM | POA: Diagnosis not present

## 2014-09-12 DIAGNOSIS — Z9221 Personal history of antineoplastic chemotherapy: Secondary | ICD-10-CM | POA: Diagnosis not present

## 2014-09-12 DIAGNOSIS — R0902 Hypoxemia: Secondary | ICD-10-CM | POA: Diagnosis not present

## 2014-09-12 DIAGNOSIS — Z880 Allergy status to penicillin: Secondary | ICD-10-CM | POA: Diagnosis not present

## 2014-09-12 DIAGNOSIS — G893 Neoplasm related pain (acute) (chronic): Secondary | ICD-10-CM | POA: Diagnosis present

## 2014-09-12 DIAGNOSIS — C3492 Malignant neoplasm of unspecified part of left bronchus or lung: Secondary | ICD-10-CM | POA: Diagnosis not present

## 2014-09-12 DIAGNOSIS — Z7189 Other specified counseling: Secondary | ICD-10-CM | POA: Diagnosis not present

## 2014-09-12 DIAGNOSIS — E8770 Fluid overload, unspecified: Secondary | ICD-10-CM | POA: Diagnosis not present

## 2014-09-12 DIAGNOSIS — Z79899 Other long term (current) drug therapy: Secondary | ICD-10-CM | POA: Diagnosis not present

## 2014-09-12 DIAGNOSIS — E43 Unspecified severe protein-calorie malnutrition: Secondary | ICD-10-CM | POA: Diagnosis present

## 2014-09-12 DIAGNOSIS — J449 Chronic obstructive pulmonary disease, unspecified: Secondary | ICD-10-CM | POA: Diagnosis present

## 2014-09-12 DIAGNOSIS — T402X5A Adverse effect of other opioids, initial encounter: Secondary | ICD-10-CM | POA: Diagnosis not present

## 2014-09-12 DIAGNOSIS — G9340 Encephalopathy, unspecified: Secondary | ICD-10-CM | POA: Diagnosis not present

## 2014-09-12 DIAGNOSIS — E876 Hypokalemia: Secondary | ICD-10-CM | POA: Diagnosis not present

## 2014-09-12 DIAGNOSIS — J9601 Acute respiratory failure with hypoxia: Secondary | ICD-10-CM | POA: Diagnosis not present

## 2014-09-12 DIAGNOSIS — B59 Pneumocystosis: Secondary | ICD-10-CM | POA: Diagnosis not present

## 2014-09-12 DIAGNOSIS — J189 Pneumonia, unspecified organism: Secondary | ICD-10-CM | POA: Diagnosis present

## 2014-09-12 DIAGNOSIS — C7951 Secondary malignant neoplasm of bone: Secondary | ICD-10-CM | POA: Diagnosis present

## 2014-09-12 DIAGNOSIS — C799 Secondary malignant neoplasm of unspecified site: Secondary | ICD-10-CM | POA: Diagnosis not present

## 2014-09-12 DIAGNOSIS — K210 Gastro-esophageal reflux disease with esophagitis: Secondary | ICD-10-CM | POA: Diagnosis present

## 2014-09-12 LAB — URINALYSIS, ROUTINE W REFLEX MICROSCOPIC
Glucose, UA: NEGATIVE mg/dL
Hgb urine dipstick: NEGATIVE
Ketones, ur: 40 mg/dL — AB
LEUKOCYTES UA: NEGATIVE
Nitrite: NEGATIVE
PROTEIN: NEGATIVE mg/dL
SPECIFIC GRAVITY, URINE: 1.027 (ref 1.005–1.030)
UROBILINOGEN UA: 0.2 mg/dL (ref 0.0–1.0)
pH: 5.5 (ref 5.0–8.0)

## 2014-09-12 LAB — CBC
HEMATOCRIT: 38.7 % (ref 36.0–46.0)
Hemoglobin: 12.4 g/dL (ref 12.0–15.0)
MCH: 28.1 pg (ref 26.0–34.0)
MCHC: 32 g/dL (ref 30.0–36.0)
MCV: 87.6 fL (ref 78.0–100.0)
PLATELETS: 405 10*3/uL — AB (ref 150–400)
RBC: 4.42 MIL/uL (ref 3.87–5.11)
RDW: 14.6 % (ref 11.5–15.5)
WBC: 11 10*3/uL — AB (ref 4.0–10.5)

## 2014-09-12 MED ORDER — MEGESTROL ACETATE 400 MG/10ML PO SUSP: 400.0000 mg | Freq: Two times a day (BID) | ORAL | Status: DC

## 2014-09-12 MED ORDER — ALBUTEROL SULFATE (2.5 MG/3ML) 0.083% IN NEBU
2.5000 mg | INHALATION_SOLUTION | RESPIRATORY_TRACT | Status: DC | PRN
  Administered 2014-09-12: 2.5 mg via RESPIRATORY_TRACT
  Filled 2014-09-12: qty 3

## 2014-09-12 MED ORDER — PROMETHAZINE HCL 25 MG/ML IJ SOLN
12.5000 mg | Freq: Four times a day (QID) | INTRAMUSCULAR | Status: DC | PRN
  Administered 2014-09-12 – 2014-09-27 (×3): 12.5 mg via INTRAVENOUS
  Filled 2014-09-12 (×3): qty 1

## 2014-09-12 MED ORDER — ONDANSETRON HCL 4 MG PO TABS
4.0000 mg | ORAL_TABLET | Freq: Four times a day (QID) | ORAL | Status: DC
  Administered 2014-09-12 – 2014-09-30 (×24): 4 mg via ORAL
  Filled 2014-09-12 (×47): qty 1

## 2014-09-12 MED ORDER — SODIUM CHLORIDE 0.9 % IV SOLN
INTRAVENOUS | Status: DC
  Administered 2014-09-12: 16:00:00 via INTRAVENOUS
  Administered 2014-09-13 – 2014-09-14 (×4): 1000 mL via INTRAVENOUS

## 2014-09-12 MED ORDER — MORPHINE SULFATE ER 30 MG PO TBCR
30.0000 mg | EXTENDED_RELEASE_TABLET | Freq: Two times a day (BID) | ORAL | Status: DC
  Administered 2014-09-12 – 2014-09-29 (×33): 30 mg via ORAL
  Filled 2014-09-12 (×34): qty 1

## 2014-09-12 MED ORDER — HYDROMORPHONE HCL 2 MG/ML IJ SOLN
2.0000 mg | Freq: Once | INTRAMUSCULAR | Status: AC
  Administered 2014-09-12: 2 mg via INTRAVENOUS
  Filled 2014-09-12: qty 1

## 2014-09-12 MED ORDER — NICOTINE 14 MG/24HR TD PT24
14.0000 mg | MEDICATED_PATCH | Freq: Every day | TRANSDERMAL | Status: DC
  Administered 2014-09-12 – 2014-09-30 (×19): 14 mg via TRANSDERMAL
  Filled 2014-09-12 (×20): qty 1

## 2014-09-12 MED ORDER — DM-GUAIFENESIN ER 30-600 MG PO TB12
1.0000 | ORAL_TABLET | Freq: Two times a day (BID) | ORAL | Status: DC
  Administered 2014-09-12 – 2014-09-30 (×35): 1 via ORAL
  Filled 2014-09-12 (×40): qty 1

## 2014-09-12 MED ORDER — LORAZEPAM 2 MG/ML IJ SOLN
1.0000 mg | Freq: Once | INTRAMUSCULAR | Status: AC
  Administered 2014-09-12: 1 mg via INTRAVENOUS
  Filled 2014-09-12: qty 1

## 2014-09-12 MED ORDER — PANTOPRAZOLE SODIUM 40 MG IV SOLR
40.0000 mg | Freq: Two times a day (BID) | INTRAVENOUS | Status: DC
  Administered 2014-09-12 – 2014-09-25 (×27): 40 mg via INTRAVENOUS
  Filled 2014-09-12 (×29): qty 40

## 2014-09-12 MED ORDER — MORPHINE SULFATE 15 MG PO TABS
15.0000 mg | ORAL_TABLET | ORAL | Status: DC | PRN
  Administered 2014-09-12 – 2014-09-30 (×30): 15 mg via ORAL
  Filled 2014-09-12 (×34): qty 1

## 2014-09-12 MED ORDER — IPRATROPIUM-ALBUTEROL 0.5-2.5 (3) MG/3ML IN SOLN
3.0000 mL | RESPIRATORY_TRACT | Status: DC | PRN
  Administered 2014-09-12 – 2014-09-13 (×3): 3 mL via RESPIRATORY_TRACT
  Filled 2014-09-12 (×3): qty 3

## 2014-09-12 MED ORDER — PANTOPRAZOLE SODIUM 40 MG PO TBEC
40.0000 mg | DELAYED_RELEASE_TABLET | Freq: Two times a day (BID) | ORAL | Status: DC
  Administered 2014-09-12: 40 mg via ORAL
  Filled 2014-09-12: qty 1

## 2014-09-12 MED ORDER — ENSURE ENLIVE PO LIQD
237.0000 mL | Freq: Three times a day (TID) | ORAL | Status: DC
  Administered 2014-09-12 – 2014-09-29 (×11): 237 mL via ORAL

## 2014-09-12 MED ORDER — ONDANSETRON HCL 4 MG/2ML IJ SOLN
4.0000 mg | Freq: Four times a day (QID) | INTRAMUSCULAR | Status: DC
  Administered 2014-09-12 – 2014-09-28 (×49): 4 mg via INTRAVENOUS
  Filled 2014-09-12 (×50): qty 2

## 2014-09-12 MED ORDER — SODIUM CHLORIDE 0.9 % IV BOLUS (SEPSIS)
1000.0000 mL | Freq: Once | INTRAVENOUS | Status: AC
  Administered 2014-09-12: 1000 mL via INTRAVENOUS

## 2014-09-12 NOTE — Progress Notes (Signed)
Report called/given to ICU. Night shift nurse will transfer.

## 2014-09-12 NOTE — Progress Notes (Signed)
Per orders, NG tube placed. Pt tolerated well. Dark red drainage 218mLs after NG placement

## 2014-09-12 NOTE — Progress Notes (Signed)
Pt transferred to ICU/Stepdown unit via bed; pt belongings bag accompanied pt to new room.

## 2014-09-12 NOTE — Consult Note (Signed)
[facility] Consult Note  Referring Provider: No ref. provider found Primary Care Physician:  No primary care provider on file. Primary Gastroenterologist:  Dr.  Antony Contras Complaint: vomiting, hematemesis HPI: [name] is an 49 y.o. cancer female  with a history of lung cancer status post chemoradiation he came in with left-sided chest pain and during that admission had some vomiting which became bloody. She had an NG placed with 100-200 mL of bloody return none since. She has not had any bowel movements. She has no prior history of GI bleeding or peptic ulcer disease  Past Medical History  Diagnosis Date  . Cancer     left bronchioles    History reviewed. No pertinent past surgical history.  Medications Prior to Admission  Medication Sig Dispense Refill  . fentaNYL (DURAGESIC - DOSED MCG/HR) 75 MCG/HR Place 75 mcg onto the skin every 3 (three) days.    Marland Kitchen oxycodone (ROXICODONE) 30 MG immediate release tablet Take 1 tablet by mouth every 8 (eight) hours as needed for pain.       Allergies:  Allergies  Allergen Reactions  . Penicillins Rash    History reviewed. No pertinent family history.  Social History:  reports that she has quit smoking. She does not have any smokeless tobacco history on file. She reports that she does not drink alcohol or use illicit drugs.  Review of Systems: negative except as above   Blood pressure 131/92, pulse 101, temperature 97.4 F (36.3 C), temperature source Oral, resp. rate 16, height 5\' 7"  (1.702 m), weight 57.9 kg (127 lb 10.3 oz), SpO2 99 %. Head: Normocephalic, without obvious abnormality, atraumatic Neck: no adenopathy, no carotid bruit, no JVD, supple, symmetrical, trachea midline and thyroid not enlarged, symmetric, no tenderness/mass/nodules Resp: clear to auscultation bilaterally Cardio: regular rate and rhythm, S1, S2 normal, no murmur, click, rub or gallop GI: Abdomen soft nondistended with normoactive bowel  sounds. No hepatosplenomegaly mass or guarding Extremities: extremities normal, atraumatic, no cyanosis or edema  Results for orders placed or performed during the hospital encounter of 09/10/14 (from the past 48 hour(s))  CBC     Status: Abnormal   Collection Time: 09/10/14  7:15 PM  Result Value Ref Range   WBC 14.2 (H) 4.0 - 10.5 K/uL   RBC 4.92 3.87 - 5.11 MIL/uL   Hemoglobin 14.5 12.0 - 15.0 g/dL   HCT 09/12/14 19.9 - 14.4 %   MCV 86.4 78.0 - 100.0 fL   MCH 29.5 26.0 - 34.0 pg   MCHC 34.1 30.0 - 36.0 g/dL   RDW 45.8 48.3 - 50.7 %   Platelets 358 150 - 400 K/uL  Basic metabolic panel     Status: Abnormal   Collection Time: 09/10/14  7:15 PM  Result Value Ref Range   Sodium 137 135 - 145 mmol/L   Potassium 3.4 (L) 3.5 - 5.1 mmol/L   Chloride 100 96 - 112 mmol/L   CO2 25 19 - 32 mmol/L   Glucose, Bld 155 (H) 70 - 99 mg/dL   BUN 7 6 - 23 mg/dL   Creatinine, Ser 09/12/14 0.50 - 1.10 mg/dL   Calcium 9.6 8.4 - 2.25 mg/dL   GFR calc non Af Amer >90 >90 mL/min   GFR calc Af Amer >90 >90 mL/min    Comment: (NOTE) The eGFR has been calculated using the CKD EPI equation. This calculation has not been validated in all clinical situations. eGFR's persistently <90 mL/min signify possible Chronic Kidney Disease.  Anion gap 12 5 - 15  Hepatic function panel     Status: None   Collection Time: 09/10/14  7:15 PM  Result Value Ref Range   Total Protein 7.5 6.0 - 8.3 g/dL   Albumin 3.8 3.5 - 5.2 g/dL   AST 18 0 - 37 U/L   ALT 8 0 - 35 U/L   Alkaline Phosphatase 95 39 - 117 U/L   Total Bilirubin 0.6 0.3 - 1.2 mg/dL   Bilirubin, Direct 0.2 0.0 - 0.5 mg/dL   Indirect Bilirubin 0.4 0.3 - 0.9 mg/dL  Lipase, blood     Status: None   Collection Time: 09/10/14  7:15 PM  Result Value Ref Range   Lipase 13 11 - 59 U/L  I-stat troponin, ED (not at [facility])     Status: None   Collection Time: 09/10/14  7:25 PM  Result Value Ref Range   Troponin i, poc 0.00 0.00 - 0.08 ng/mL   Comment 3             Comment: Due to the release kinetics of cTnI, a negative result within the first hours of the onset of symptoms does not rule out myocardial infarction with certainty. If myocardial infarction is still suspected, repeat the test at appropriate intervals.   Comprehensive metabolic panel     Status: Abnormal   Collection Time: 09/11/14  3:35 AM  Result Value Ref Range   Sodium 137 135 - 145 mmol/L   Potassium 3.6 3.5 - 5.1 mmol/L   Chloride 103 96 - 112 mmol/L   CO2 24 19 - 32 mmol/L   Glucose, Bld 120 (H) 70 - 99 mg/dL   BUN 6 6 - 23 mg/dL   Creatinine, Ser 0.60 0.50 - 1.10 mg/dL   Calcium 9.0 8.4 - 10.5 mg/dL   Total Protein 6.7 6.0 - 8.3 g/dL   Albumin 3.4 (L) 3.5 - 5.2 g/dL   AST 12 0 - 37 U/L   ALT 8 0 - 35 U/L   Alkaline Phosphatase 86 39 - 117 U/L   Total Bilirubin 0.9 0.3 - 1.2 mg/dL   GFR calc non Af Amer >90 >90 mL/min   GFR calc Af Amer >90 >90 mL/min    Comment: (NOTE) The eGFR has been calculated using the CKD EPI equation. This calculation has not been validated in all clinical situations. eGFR's persistently <90 mL/min signify possible Chronic Kidney Disease.    Anion gap 10 5 - 15  CBC     Status: Abnormal   Collection Time: 09/11/14  3:35 AM  Result Value Ref Range   WBC 15.2 (H) 4.0 - 10.5 K/uL   RBC 4.48 3.87 - 5.11 MIL/uL   Hemoglobin 12.7 12.0 - 15.0 g/dL   HCT 38.9 36.0 - 46.0 %   MCV 86.8 78.0 - 100.0 fL   MCH 28.3 26.0 - 34.0 pg   MCHC 32.6 30.0 - 36.0 g/dL   RDW 14.5 11.5 - 15.5 %   Platelets 442 (H) 150 - 400 K/uL  Protime-INR     Status: None   Collection Time: 09/11/14  3:35 AM  Result Value Ref Range   Prothrombin Time 13.6 11.6 - 15.2 seconds   INR 1.03 0.00 - 1.49  Troponin I     Status: None   Collection Time: 09/11/14  3:35 AM  Result Value Ref Range   Troponin I <0.03 <0.031 ng/mL    Comment:        NO INDICATION OF  MYOCARDIAL INJURY.   Troponin I     Status: None   Collection Time: 09/11/14  9:50 AM  Result Value Ref Range    Troponin I <0.03 <0.031 ng/mL    Comment:        NO INDICATION OF MYOCARDIAL INJURY.   Troponin I     Status: None   Collection Time: 09/11/14  3:40 PM  Result Value Ref Range   Troponin I <0.03 <0.031 ng/mL    Comment:        NO INDICATION OF MYOCARDIAL INJURY.   Urinalysis, Routine w reflex microscopic     Status: Abnormal   Collection Time: 09/12/14 12:54 AM  Result Value Ref Range   Color, Urine YELLOW YELLOW   APPearance CLOUDY (A) CLEAR   Specific Gravity, Urine 1.027 1.005 - 1.030   pH 5.5 5.0 - 8.0   Glucose, UA NEGATIVE NEGATIVE mg/dL   Hgb urine dipstick NEGATIVE NEGATIVE   Bilirubin Urine SMALL (A) NEGATIVE   Ketones, ur 40 (A) NEGATIVE mg/dL   Protein, ur NEGATIVE NEGATIVE mg/dL   Urobilinogen, UA 0.2 0.0 - 1.0 mg/dL   Nitrite NEGATIVE NEGATIVE   Leukocytes, UA NEGATIVE NEGATIVE    Comment: MICROSCOPIC NOT DONE ON URINES WITH NEGATIVE PROTEIN, BLOOD, LEUKOCYTES, NITRITE, OR GLUCOSE <1000 mg/dL.  CBC     Status: Abnormal   Collection Time: 09/12/14  5:20 PM  Result Value Ref Range   WBC 11.0 (H) 4.0 - 10.5 K/uL   RBC 4.42 3.87 - 5.11 MIL/uL   Hemoglobin 12.4 12.0 - 15.0 g/dL   HCT 38.7 36.0 - 46.0 %   MCV 87.6 78.0 - 100.0 fL   MCH 28.1 26.0 - 34.0 pg   MCHC 32.0 30.0 - 36.0 g/dL   RDW 14.6 11.5 - 15.5 %   Platelets 405 (H) 150 - 400 K/uL   Ct Chest W Contrast  09/11/2014   CLINICAL DATA:  Left-sided chest pain radiating to the back. 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  EXAM: CT CHEST WITH CONTRAST  TECHNIQUE: Multidetector CT imaging of the chest was performed during intravenous contrast administration.  CONTRAST:  58mL OMNIPAQUE IOHEXOL 300 MG/ML  SOLN  COMPARISON:  Chest radiograph earlier this day. No remote exams available for comparison.  FINDINGS: Right chest port with tip in the proximal SVC. There is volume loss in the left hemithorax with short this segment of marked luminal narrowing involving the left mainstem bronchus at  the left hilum. Soft tissue density is noted adjacent to the right and left bronchi at the hilum. There is paramediastinal soft tissue density in the left upper lobe that has heterogeneous enhancement. Within the left upper lobe this a pleural-based bilobed 1.6 x 1.6 cm nodule. Reticular opacities at the left lung apex with associated apical thickening. Minimal linear opacities in the left lower lobe, with irregular opacity at the costophrenic angle. There is a small partially loculated left pleural effusion.  No right-sided tiny subpleural nodule opacities at the lung apex measuring 4 mm. There are otherwise no right pulmonary nodules. Emphysematous changes noted. There is mild paramediastinal fibrosis in the right lung that may be radiation change.  There is a 2.3 x 1.4 cm probable necrotic lymph node in the AP window. Minimal soft tissue density in the left hilum. There is otherwise no mediastinal adenopathy. Small pericardial effusion primarily adjacent to the right atrium. No pericardial nodularity or enhancement  The heart size is normal. The thoracic aorta is normal in  caliber without dissection.  Evaluation of the upper abdomen demonstrates no acute abnormality. There are no adrenal nodules. No focal hepatic lesion in the included liver.  Lobular lucent partially expansile lesion with and posterior left ninth rib concerning for metastasis. No pathologic fracture. No definite additional focal osseous lesion.  IMPRESSION: 1. Volume loss in the left hemithorax with paramediastinal soft tissue density. There is para-mediastinal reticular change. Findings suggest posttreatment related change related to lung cancer, however no prior exams are available for comparison and correlation with history is recommended. There is a bilobed 1.6 cm pleural based nodule in the left upper lobe concerning for malignancy. Probable necrotic lymph node in the AP window. 2. Lucent lesion in left posterior ninth rib concerning for  bony metastasis. 3. Underlying emphysema. Tiny subpleural nodularity at the right lung apex is nonspecific, suspect scarring, again correlation with prior imaging recommended.   Electronically Signed   By: Jeb Levering M.D.   On: 09/11/2014 00:30   Dg Chest Port 1 View  09/10/2014   CLINICAL DATA:  Left-sided chest pain.  Lung carcinoma  EXAM: PORTABLE CHEST - 1 VIEW  COMPARISON:  None.  FINDINGS: There is elevation of the left hemidiaphragm. There is a small left pleural effusion with atelectatic change in left base. There is ill-defined opacity in the left upper lobe, probably representing pneumonia, although mass in this area is a differential consideration. Right lung is clear. Heart size is normal. Pulmonary vascularity is within normal limits. No adenopathy. Port-A-Cath tip is in the superior vena cava. No pneumothorax evident.  IMPRESSION: Opacity left upper lobe. Probable pneumonia, although mass in this area is a differential consideration. Small left effusion. Volume loss on left. Right lung clear.   Electronically Signed   By: Lowella Grip III M.D.   On: 09/10/2014 19:35    Assessment: Upper GI bleed non-destabilizing in a patient with lung cancer status post chemoradiation could represent some component of radiation esophagitis Plan:   IV PPI and Carafate. Will discontinue NG tube. Hold off on EGD today but consider tomorrow depending on her respiratory status and chest pain. [name] C 09/12/2014, 6:01 PM

## 2014-09-12 NOTE — Progress Notes (Signed)
  Echocardiogram 2D Echocardiogram has been performed.  [name] 09/12/2014, 10:34 AM

## 2014-09-12 NOTE — Progress Notes (Signed)
UR completed 

## 2014-09-12 NOTE — Progress Notes (Deleted)
Consult request received. Pt set up for EGD tomorrow AM if respiratory status permits

## 2014-09-12 NOTE — Progress Notes (Addendum)
[facility] Progress Note   Lorrinda Ramstad JAS:[id number] DOB: October 01, 1965 DOA: 09/10/2014 PCP: No primary care provider on file.  Brief narrative: Onna Nodal is a 49 y.o. female with Lung cancer who was treated for it in [place]. She was told recently after a PET scan that she had a mass in her lung and needed further treatment but she decided to move her to live with family this past Monday. She presented to the hospital on Tues with left chest pain and was admitted for pain control.   Subjective: "feels bad" pain is much better controlled but feels tired. Noted to have blood in basin next to her- tells me she has been vomiting since yesterday. No abdominal pain.   Assessment/Plan: Principal Problem:   Cancer associated pain - much better controlled- Fentanyl 75 mcg (outpt med), MS Contin 30 BID, MSIR 15 Q 4 PRN, Dilaudid 1mg  Q3 PRN  Active Problems:  Hematemesis? 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 Probable necrotic lymph node in the AP window. Lucent lesion in left posterior ninth rib concerning for bony metastasis": - per oncologist on call, Dr Julien Nordmann will be best to treat her when he returns from vacation- Appt made for 4/5- if patient still in hospital, we are to call him next week - attempting to obtain records from her oncoloist- the patient does not have any records     Anorexia - start Ensure and Megace  Ongoing smoking - Nicotine patch    Code Status: full code Family Communication:  Disposition Plan: home when pain controlled and no longer vomitng DVT prophylaxis: SCDs Consultants: Procedures:  Antibiotics: Anti-infectives    None      Objective: Filed Weights   09/11/14 0214 09/12/14 0513  Weight: 58.65 kg (129 lb 4.8 oz) 57.9 kg (127 lb 10.3 oz)    Intake/Output Summary (Last 24 hours) at 09/12/14 1341 Last data filed at 09/12/14 1303  Gross per 24 hour  Intake     30 ml  Output    900 ml  Net   -870 ml     Vitals Filed Vitals:   09/12/14 0513 09/12/14 0549 09/12/14 0900 09/12/14 1255  BP: 139/91   139/92  Pulse: 93  81 93  Temp:  98.2 F (36.8 C)  97.7 F (36.5 C)  TempSrc:  Oral  Oral  Resp: 20   18  Height:      Weight: 57.9 kg (127 lb 10.3 oz)     SpO2: 100%  100% 100%    Exam:  General:  Pt is alert, restless, appears unhappy   HEENT: No icterus, No thrush  Cardiovascular: regular rate and rhythm, S1/S2 No murmur  Respiratory: clear to auscultation bilaterally   Abdomen: Soft, +Bowel sounds, non tender, non distended, no guarding  MSK: No LE edema, cyanosis or clubbing  Data Reviewed: Basic Metabolic Panel:  Recent Labs Lab 09/10/14 1915 09/11/14 0335  NA 137 137  K 3.4* 3.6  CL 100 103  CO2 25 24  GLUCOSE 155* 120*  BUN 7 6  CREATININE 0.70 0.60  CALCIUM 9.6 9.0   Liver Function Tests:  Recent Labs Lab 09/10/14 1915 09/11/14 0335  AST 18 12  ALT 8 8  ALKPHOS 95 86  BILITOT 0.6 0.9  PROT 7.5 6.7  ALBUMIN 3.8 3.4*    Recent Labs Lab 09/10/14 1915  LIPASE 13   No results for input(s): AMMONIA in the last 168 hours. CBC:  Recent Labs Lab 09/10/14 1915 09/11/14 0335  WBC 14.2* 15.2*  HGB 14.5 12.7  HCT 42.5 38.9  MCV 86.4 86.8  PLT 358 442*   Cardiac Enzymes:  Recent Labs Lab 09/11/14 0335 09/11/14 0950 09/11/14 1540  TROPONINI <0.03 <0.03 <0.03   BNP  (last 3 results) No results for input(s): BNP in the last 8760 hours.  ProBNP (last 3 results) No results for input(s): PROBNP in the last 8760 hours.  CBG: No results for input(s): GLUCAP in the last 168 hours.  No results found for this or any previous visit (from the past 240 hour(s)).   Studies:  Recent x-ray studies have been reviewed in detail by the Attending Physician  Scheduled Meds:  Scheduled Meds: . antiseptic oral rinse  7 mL Mouth Rinse BID  . feeding supplement (ENSURE ENLIVE)  237 mL Oral TID BM  . fentaNYL  75 mcg Transdermal Q72H  . megestrol  400 mg Oral BID  . morphine  30 mg Oral Q12H  . nicotine  14 mg Transdermal Daily  . ondansetron  4 mg Oral Q6H   Or  . ondansetron (ZOFRAN) IV  4 mg Intravenous Q6H  . pantoprazole  40 mg Oral BID  . polyethylene glycol  17 g Oral Daily  . senna-docusate  1 tablet Oral QHS  . sodium chloride  3 mL Intravenous Q12H   Continuous Infusions:   Time spent on care of this patient: 35 min   Edgar, MD 09/12/2014, 1:41 PM  LOS: 1 day   [facility] Office  [phone] Pager - Text Page per [email or web address]  If 7PM-7AM, please contact night-coverage [email or web address]

## 2014-09-12 NOTE — Progress Notes (Signed)
Patient's rythym went SVT, HR up to 160's while feeling nauseated and vomiting. It did not sustain,  EKG showed sinus rythym. At the time EKG was done, HR is done to 90's.

## 2014-09-12 NOTE — Progress Notes (Signed)
Pt stated that she was having trouble breathing. I checked her oxygen saturation and it was 100%. I paged Dr. Wynelle Cleveland and she ordered a breathing treatment. After the pt completed the treatment, she stated that she felt she could breathe easier. Will continue to monitor.

## 2014-09-13 LAB — BASIC METABOLIC PANEL
Anion gap: 9 (ref 5–15)
Anion gap: 13 (ref 5–15)
BUN: 7 mg/dL (ref 6–23)
BUN: 8 mg/dL (ref 6–23)
CALCIUM: 9.1 mg/dL (ref 8.4–10.5)
CO2: 28 mmol/L (ref 19–32)
CO2: 27 mmol/L (ref 19–32)
Calcium: 8.6 mg/dL (ref 8.4–10.5)
Chloride: 100 mmol/L (ref 96–112)
Chloride: 96 mmol/L (ref 96–112)
Creatinine, Ser: 0.43 mg/dL — ABNORMAL LOW (ref 0.50–1.10)
Creatinine, Ser: 0.46 mg/dL — ABNORMAL LOW (ref 0.50–1.10)
GFR calc Af Amer: >90 mL/min (ref >90)
GFR calc Af Amer: >90 mL/min (ref >90)
GFR calc non Af Amer: >90 mL/min (ref >90)
GFR calc non Af Amer: >90 mL/min (ref >90)
GLUCOSE: 109 mg/dL — AB (ref 70–99)
Glucose, Bld: 124 mg/dL — ABNORMAL HIGH (ref 70–99)
POTASSIUM: 3.9 mmol/L (ref 3.5–5.1)
Potassium: 4.1 mmol/L (ref 3.5–5.1)
SODIUM: 137 mmol/L (ref 135–145)
SODIUM: 136 mmol/L (ref 135–145)

## 2014-09-13 LAB — CBC
HCT: 36.7 % (ref 36.0–46.0)
Hemoglobin: 11.7 g/dL — ABNORMAL LOW (ref 12.0–15.0)
MCH: 27.9 pg (ref 26.0–34.0)
MCHC: 31.9 g/dL (ref 30.0–36.0)
MCV: 87.4 fL (ref 78.0–100.0)
PLATELETS: 349 10*3/uL (ref 150–400)
RBC: 4.2 MIL/uL (ref 3.87–5.11)
RDW: 14.6 % (ref 11.5–15.5)
WBC: 6.8 10*3/uL (ref 4.0–10.5)

## 2014-09-13 LAB — TROPONIN I: Troponin I: <0.03 ng/mL (ref <0.031)

## 2014-09-13 LAB — MRSA PCR SCREENING: MRSA BY PCR: NEGATIVE

## 2014-09-13 LAB — MAGNESIUM: Magnesium: 1.7 mg/dL (ref 1.5–2.5)

## 2014-09-13 MED ORDER — HYDRALAZINE HCL 20 MG/ML IJ SOLN
10.0000 mg | Freq: Four times a day (QID) | INTRAMUSCULAR | Status: DC | PRN
  Administered 2014-09-13: 10 mg via INTRAVENOUS
  Filled 2014-09-13: qty 1

## 2014-09-13 MED ORDER — ALTEPLASE 2 MG IJ SOLR
2.0000 mg | Freq: Once | INTRAMUSCULAR | Status: AC
  Administered 2014-09-13: 2 mg
  Filled 2014-09-13: qty 2

## 2014-09-13 MED ORDER — MAGNESIUM SULFATE 2 GM/50ML IV SOLN
2.0000 g | Freq: Once | INTRAVENOUS | Status: AC
  Administered 2014-09-13: 2 g via INTRAVENOUS
  Filled 2014-09-13: qty 50

## 2014-09-13 MED ORDER — SODIUM CHLORIDE 0.9 % IJ SOLN
10.0000 mL | INTRAMUSCULAR | Status: DC | PRN
  Administered 2014-09-13: 30 mL
  Administered 2014-09-13: 40 mL
  Administered 2014-09-14: 20 mL
  Administered 2014-09-16 – 2014-09-27 (×3): 10 mL
  Administered 2014-09-28: 30 mL
  Administered 2014-09-28: 20 mL
  Administered 2014-09-29: 40 mL
  Administered 2014-09-30: 10 mL
  Filled 2014-09-13 (×9): qty 40

## 2014-09-13 MED ORDER — AMIODARONE LOAD VIA INFUSION
150.0000 mg | Freq: Once | INTRAVENOUS | Status: AC
  Administered 2014-09-13: 150 mg via INTRAVENOUS
  Filled 2014-09-13: qty 83.34

## 2014-09-13 MED ORDER — AMIODARONE HCL IN DEXTROSE 360-4.14 MG/200ML-% IV SOLN
30.0000 mg/h | INTRAVENOUS | Status: DC
  Administered 2014-09-13 – 2014-09-14 (×2): 30 mg/h via INTRAVENOUS
  Filled 2014-09-13 (×2): qty 200

## 2014-09-13 MED ORDER — FENTANYL 100 MCG/HR TD PT72
100.0000 ug | MEDICATED_PATCH | TRANSDERMAL | Status: DC
  Administered 2014-09-13 – 2014-09-28 (×6): 100 ug via TRANSDERMAL
  Filled 2014-09-13 (×6): qty 1

## 2014-09-13 MED ORDER — FUROSEMIDE 10 MG/ML IJ SOLN
20.0000 mg | Freq: Once | INTRAMUSCULAR | Status: AC
  Administered 2014-09-13: 20 mg via INTRAVENOUS
  Filled 2014-09-13: qty 2

## 2014-09-13 MED ORDER — POTASSIUM CHLORIDE 10 MEQ/100ML IV SOLN
10.0000 meq | INTRAVENOUS | Status: AC
  Administered 2014-09-13 (×2): 10 meq via INTRAVENOUS
  Filled 2014-09-13 (×2): qty 100

## 2014-09-13 MED ORDER — AMIODARONE HCL IN DEXTROSE 360-4.14 MG/200ML-% IV SOLN
60.0000 mg/h | INTRAVENOUS | Status: AC
  Administered 2014-09-13: 60 mg/h via INTRAVENOUS

## 2014-09-13 MED ORDER — AMIODARONE HCL IN DEXTROSE 360-4.14 MG/200ML-% IV SOLN
INTRAVENOUS | Status: AC
  Filled 2014-09-13: qty 200

## 2014-09-13 MED ORDER — DILTIAZEM HCL 100 MG IV SOLR
5.0000 mg/h | INTRAVENOUS | Status: DC
  Administered 2014-09-13: 5 mg/h via INTRAVENOUS
  Administered 2014-09-13: 15 mg/h via INTRAVENOUS

## 2014-09-13 MED ORDER — AMIODARONE IV BOLUS ONLY 150 MG/100ML
INTRAVENOUS | Status: AC
Start: 2014-09-13 — End: 2014-09-13
  Administered 2014-09-13: 150 mg
  Filled 2014-09-13: qty 100

## 2014-09-13 NOTE — Progress Notes (Signed)
[name] DOB: 21-May-1966 MRN: [id number]   Date of Service  09/13/2014  HPI/Events of Note  Patient in what appears to be in AFIB with a ventricular rate 170-200. BP = 125/96.  eICU Interventions  Will order: 1. BMP and Magnesium level now.  2. Troponin I now and Q 6 hours X 3 sets. 3. Cardizem IV infusion at 5 mg/hour. Titrate. Maximum dose = 15 mg/hour.      Intervention Category Major Interventions: Arrhythmia - evaluation and management  Arienna Benegas [name] 09/13/2014, 4:51 PM

## 2014-09-13 NOTE — Progress Notes (Signed)
[facility] Progress Note Patient Name: [name] DOB: 04/06/1966 MRN: [id number]   Date of Service  09/13/2014  HPI/Events of Note  HR remains = 189 on Cardizem IV infusion at 12.5 mg/Hour. BMP and Magnesium level not back yet.  eICU Interventions  Will order: 1. Increase Cardizem to 15 mg/hour. 2. Amiodarone IV bolus and IV infusion.     Intervention Category Major Interventions: Arrhythmia - evaluation and management  Sommer,Steven [name] 09/13/2014, 5:37 PM

## 2014-09-13 NOTE — Progress Notes (Addendum)
[facility] Progress Note   Audrea Bolte NGE:[id number] DOB: [birth year] DOA: 09/10/2014 PCP: No primary care provider on file.  Brief narrative: [name] is a 49 y.o. female with Lung cancer who was treated for it in [place]. She was told recently after a PET scan that she had a mass in her lung and needed further treatment but she decided to move her to live with family this past Sunday for family to help care for her. Her records were sent to Dr Julien Nordmann. She presented to the hospital on Tues with ongoing severe left chest pain and was admitted for pain control.   Subjective: Sleeping - appears quite comfortable when awakened. Pain is 5/10. No further vomiting.   Assessment/Plan: Principal Problem:   Cancer associated pain? - she has been on narcotics for months for pain starting at her left lower rib cage and radiating upwards towards axilla- the pain was severe  - much better controlled- Fentanyl 75 mcg (outpt med), MS Contin 30 BID, MSIR 15 Q 4 PRN - still requiring Dilaudid 1mg  Q3 PRN- will increase Fentanyl to 100 mcg  Active Problems:  Hematemesis - no prior history of this - vomited blood three times on 3/25- transferred to SDU-  - started BID Prontonix, stoppped Heparin for DVT prophylaxis - was also given a dose of Toradol on 3/25 which could have contributed - GI plans on scope - may have radiation esophagitis    Lung cancer, primary, 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 - CT report- "bilobed 1.6 cm pleural based nodule in the left upper lobe concerning for malignancy. Probable necrotic lymph node in the AP window. Lucent lesion in left posterior ninth rib concerning for bony metastasis": -- Appt made for Dr Julien Nordmann for 4/5-  if patient still in hospital, we are to call him next week  Cough with congestion productive of white sputum - either due to chronic bronchitis or radiation pneumonitis- PRN Nebs, Mucinex     Anorexia-  - started Ensure and Megace-   Ongoing smoking - Nicotine patch    Code Status: full code Family Communication: brother Disposition Plan: home when pain controlled and no longer vomitng DVT prophylaxis: SCDs Consultants: GI Procedures:  Antibiotics: Anti-infectives    None      Objective: Filed Weights   09/12/14 0513 09/12/14 2047 09/13/14 0401  Weight: 57.9 kg (127 lb 10.3 oz) 59.3 kg (130 lb 11.7 oz) 60 kg (132 lb 4.4 oz)    Intake/Output Summary (Last 24 hours) at 09/13/14 1127 Last data filed at 09/13/14 0902  Gross per 24 hour  Intake 1690.42 ml  Output   2850 ml  Net -1159.58 ml     Vitals Filed Vitals:   09/13/14 0523 09/13/14 0600 09/13/14 0611 09/13/14 0845  BP: 133/90 163/99    Pulse:  116    Temp:    98.4 F (36.9 C)  TempSrc:    Oral  Resp:  29    Height:      Weight:      SpO2:  100% 100%     Exam:  General:  Pt is alert, calm, no distress  HEENT: No icterus, No thrush  Cardiovascular: regular rate and rhythm, S1/S2 No murmur  Respiratory: clear  to auscultation bilaterally   Abdomen: Soft, +Bowel sounds, non tender, non distended, no guarding  MSK: No LE edema, cyanosis or clubbing  Data Reviewed: Basic Metabolic Panel:  Recent Labs Lab 09/10/14 1915 09/11/14 0335 09/13/14 0400  NA 137 137 137  K 3.4* 3.6 4.1  CL 100 103 100  CO2 25 24 28   GLUCOSE 155* 120* 124*  BUN 7 6 7   CREATININE 0.70 0.60 0.43*  CALCIUM 9.6 9.0 8.6   Liver Function Tests:  Recent Labs Lab 09/10/14 1915 09/11/14 0335  AST 18 12  ALT 8 8  ALKPHOS 95 86  BILITOT 0.6 0.9  PROT 7.5 6.7  ALBUMIN 3.8 3.4*    Recent Labs Lab 09/10/14 1915  LIPASE 13   No results for input(s): AMMONIA in the last 168 hours. CBC:  Recent Labs Lab  09/10/14 1915 09/11/14 0335 09/12/14 1720 09/13/14 0400  WBC 14.2* 15.2* 11.0* 6.8  HGB 14.5 12.7 12.4 11.7*  HCT 42.5 38.9 38.7 36.7  MCV 86.4 86.8 87.6 87.4  PLT 358 442* 405* 349   Cardiac Enzymes:  Recent Labs Lab 09/11/14 0335 09/11/14 0950 09/11/14 1540  TROPONINI <0.03 <0.03 <0.03   BNP (last 3 results) No results for input(s): BNP in the last 8760 hours.  ProBNP (last 3 results) No results for input(s): PROBNP in the last 8760 hours.  CBG: No results for input(s): GLUCAP in the last 168 hours.  Recent Results (from the past 240 hour(s))  MRSA PCR Screening     Status: None   Collection Time: 09/12/14 10:27 PM  Result Value Ref Range Status   MRSA by PCR NEGATIVE NEGATIVE Final    Comment:        The GeneXpert MRSA Assay (FDA approved for NASAL specimens only), is one component of a comprehensive MRSA colonization surveillance program. It is not intended to diagnose MRSA infection nor to guide or monitor treatment for MRSA infections.      Studies:  Recent x-ray studies have been reviewed in detail by the Attending Physician  Scheduled Meds:  Scheduled Meds: . antiseptic oral rinse  7 mL Mouth Rinse BID  . dextromethorphan-guaiFENesin  1 tablet Oral BID  . feeding supplement (ENSURE ENLIVE)  237 mL Oral TID BM  . fentaNYL  75 mcg Transdermal Q72H  . megestrol  400 mg Oral BID  . morphine  30 mg Oral Q12H  . nicotine  14 mg Transdermal Daily  . ondansetron  4 mg Oral Q6H   Or  . ondansetron (ZOFRAN) IV  4 mg Intravenous Q6H  . pantoprazole (PROTONIX) IV  40 mg Intravenous Q12H  . polyethylene glycol  17 g Oral Daily  . senna-docusate  1 tablet Oral QHS  . sodium chloride  3 mL Intravenous Q12H   Continuous Infusions: . sodium chloride 1,000 mL (09/13/14 0639)    Time spent on care of this patient: 35 min   [place], MD 09/13/2014, 11:27 AM  LOS: 2 days   [facility] Office  [phone] Pager - Text Page per  [email or web address]  If 7PM-7AM, please contact night-coverage [email or web address]

## 2014-09-13 NOTE — Progress Notes (Signed)
PT Cancellation Note  Patient Details Name: [name] MRN: [id number] DOB: Jul 10, 1965   Cancelled Treatment:    Reason Eval/Treat Not Completed: Medical issues which prohibited therapy--pt currently on bedrest in order set. Will need updated activity level status before able to complete PT eval. Thanks.    Weston Anna, MPT Pager: [phone]

## 2014-09-13 NOTE — Progress Notes (Signed)
Ong Progress Note Patient Name: [name] DOB: [birth year] MRN: [id number]   Date of Service  09/13/2014  HPI/Events of Note  Magnesium = 1.7, Potassium = 3.9 and Creatinine = 0.46  eICU Interventions  Will replete Magnesium and Postassium.      Intervention Category Intermediate Interventions: Electrolyte abnormality - evaluation and management  Erykah Lippert Eugene 09/13/2014, 7:40 PM

## 2014-09-14 ENCOUNTER — Encounter (HOSPITAL_COMMUNITY)
Admission: EM | Disposition: A | Discharge status: Skilled Nursing Facility | Payer: Self-pay | Source: Home / Self Care | Attending: Internal Medicine

## 2014-09-14 ENCOUNTER — Encounter (HOSPITAL_COMMUNITY): Payer: Self-pay

## 2014-09-14 ENCOUNTER — Inpatient Hospital Stay (HOSPITAL_COMMUNITY): Payer: 59

## 2014-09-14 DIAGNOSIS — I480 Paroxysmal atrial fibrillation: Secondary | ICD-10-CM

## 2014-09-14 HISTORY — PX: ESOPHAGOGASTRODUODENOSCOPY: SHX5428

## 2014-09-14 LAB — CBC
HCT: 32.3 % — ABNORMAL LOW (ref 36.0–46.0)
HEMOGLOBIN: 10.5 g/dL — AB (ref 12.0–15.0)
MCH: 28.4 pg (ref 26.0–34.0)
MCHC: 32.5 g/dL (ref 30.0–36.0)
MCV: 87.3 fL (ref 78.0–100.0)
Platelets: 278 10*3/uL (ref 150–400)
RBC: 3.7 MIL/uL — ABNORMAL LOW (ref 3.87–5.11)
RDW: 14.7 % (ref 11.5–15.5)
WBC: 6.1 10*3/uL (ref 4.0–10.5)

## 2014-09-14 LAB — TROPONIN I

## 2014-09-14 SURGERY — EGD (ESOPHAGOGASTRODUODENOSCOPY)
Anesthesia: Moderate Sedation

## 2014-09-14 MED ORDER — DIPHENHYDRAMINE HCL 50 MG/ML IJ SOLN
INTRAMUSCULAR | Status: AC
  Filled 2014-09-14: qty 1

## 2014-09-14 MED ORDER — FENTANYL CITRATE 0.05 MG/ML IJ SOLN
INTRAMUSCULAR | Status: AC
  Filled 2014-09-14: qty 2

## 2014-09-14 MED ORDER — METRONIDAZOLE IN NACL 5-0.79 MG/ML-% IV SOLN
500.0000 mg | Freq: Three times a day (TID) | INTRAVENOUS | Status: DC
  Administered 2014-09-14 – 2014-09-15 (×3): 500 mg via INTRAVENOUS
  Filled 2014-09-14 (×3): qty 100

## 2014-09-14 MED ORDER — MIDAZOLAM HCL 10 MG/2ML IJ SOLN
INTRAMUSCULAR | Status: DC | PRN
  Administered 2014-09-14: 2 mg via INTRAVENOUS

## 2014-09-14 MED ORDER — DIPHENHYDRAMINE HCL 50 MG/ML IJ SOLN
INTRAMUSCULAR | Status: DC | PRN
  Administered 2014-09-14: 12.5 mg via INTRAVENOUS

## 2014-09-14 MED ORDER — MIDAZOLAM HCL 10 MG/2ML IJ SOLN
INTRAMUSCULAR | Status: AC
  Filled 2014-09-14: qty 2

## 2014-09-14 MED ORDER — FENTANYL CITRATE 0.05 MG/ML IJ SOLN
INTRAMUSCULAR | Status: DC | PRN
  Administered 2014-09-14: 25 ug via INTRAVENOUS

## 2014-09-14 MED ORDER — LEVOFLOXACIN IN D5W 750 MG/150ML IV SOLN
750.0000 mg | INTRAVENOUS | Status: DC
  Administered 2014-09-14: 750 mg via INTRAVENOUS
  Filled 2014-09-14: qty 150

## 2014-09-14 MED ORDER — VANCOMYCIN HCL 500 MG IV SOLR
500.0000 mg | Freq: Three times a day (TID) | INTRAVENOUS | Status: DC
  Administered 2014-09-14 – 2014-09-15 (×4): 500 mg via INTRAVENOUS
  Filled 2014-09-14 (×5): qty 500

## 2014-09-14 MED ORDER — SODIUM CHLORIDE 0.9 % IV SOLN
INTRAVENOUS | Status: DC
  Administered 2014-09-14: 10:00:00 via INTRAVENOUS

## 2014-09-14 MED ORDER — BUTAMBEN-TETRACAINE-BENZOCAINE 2-2-14 % EX AERO
INHALATION_SPRAY | CUTANEOUS | Status: DC | PRN
  Administered 2014-09-14: 2 via TOPICAL

## 2014-09-14 NOTE — Progress Notes (Signed)
ANTIBIOTIC CONSULT NOTE - INITIAL  Pharmacy Consult for Vancomycin/Levaquin/Flagyl Indication: HCAP/aspiration PNA  Allergies  Allergen Reactions  . Penicillins Rash    Patient Measurements: Height: 5\' 7"  (170.2 cm) Weight: 129 lb 13.6 oz (58.9 kg) IBW/kg (Calculated) : 61.6   Vital Signs: Temp: 100.3 F (37.9 C) (03/27 0400) Temp Source: Oral (03/27 0400) BP: 109/65 mmHg (03/27 0700) Intake/Output from previous day: 03/26 0701 - 03/27 0700 In: 2005.1 [I.V.:1755.1; IV Piggyback:250] Out: 9476 [Urine:1725] Intake/Output from this shift:    Labs:  Recent Labs  09/12/14 1720 09/13/14 0400 09/13/14 1712 09/14/14 0330  WBC 11.0* 6.8  --  6.1  HGB 12.4 11.7*  --  10.5*  PLT 405* 349  --  278  CREATININE  --  0.43* 0.46*  --    Estimated Creatinine Clearance: 80.8 mL/min (by C-G formula based on Cr of 0.46). No results for input(s): VANCOTROUGH, VANCOPEAK, VANCORANDOM, GENTTROUGH, GENTPEAK, GENTRANDOM, TOBRATROUGH, TOBRAPEAK, TOBRARND, AMIKACINPEAK, AMIKACINTROU, AMIKACIN in the last 72 hours.   Microbiology: Recent Results (from the past 720 hour(s))  MRSA PCR Screening     Status: None   Collection Time: 09/12/14 10:27 PM  Result Value Ref Range Status   MRSA by PCR NEGATIVE NEGATIVE Final    Comment:        The GeneXpert MRSA Assay (FDA approved for NASAL specimens only), is one component of a comprehensive MRSA colonization surveillance program. It is not intended to diagnose MRSA infection nor to guide or monitor treatment for MRSA infections.     Medical History: Past Medical History  Diagnosis Date  . Cancer     left bronchioles    Medications:  Scheduled:  . antiseptic oral rinse  7 mL Mouth Rinse BID  . dextromethorphan-guaiFENesin  1 tablet Oral BID  . feeding supplement (ENSURE ENLIVE)  237 mL Oral TID BM  . fentaNYL  100 mcg Transdermal Q72H  . megestrol  400 mg Oral BID  . morphine  30 mg Oral Q12H  . nicotine  14 mg Transdermal  Daily  . ondansetron  4 mg Oral Q6H   Or  . ondansetron (ZOFRAN) IV  4 mg Intravenous Q6H  . pantoprazole (PROTONIX) IV  40 mg Intravenous Q12H  . polyethylene glycol  17 g Oral Daily  . senna-docusate  1 tablet Oral QHS  . sodium chloride  3 mL Intravenous Q12H   Infusions:  . sodium chloride 1,000 mL (09/13/14 1643)  . amiodarone 30 mg/hr (09/13/14 2327)   PRN: acetaminophen **OR** acetaminophen, albuterol, alum & mag hydroxide-simeth, calcium carbonate, guaiFENesin-dextromethorphan, hydrALAZINE, HYDROmorphone (DILAUDID) injection, ipratropium-albuterol, morphine, promethazine, sodium chloride Assessment: 49 yo female with lung cancer previously treated in [place] but now with a mass in her lung and requiring further treatment. Pt with fever and increased cough and overnight may have aspirated on vomitus. Vancomycin, Levaquin and Flagyl (MD didn't want Clindamycin or Primaxin for anaerobic coverage)  Goal of Therapy:  Vancomycin trough level 15-20 mcg/ml  Plan:  Will give Vancomycin 500mg  IV Q8h  Will check Vanco trough level at steady state Levaquin 750mg  IV Q24h Flagyl 500mg  IV Q8h Will f/u Scr and Cx  Garnet Sierras 09/14/2014,8:33 AM

## 2014-09-14 NOTE — Progress Notes (Signed)
Cardizem held for 1 hr; from 0300-0400 for soft BP, and restarted at 5mg  at 0400. Remains in NSR.Aminodarone at 30mg /hr. On 4l [place].

## 2014-09-14 NOTE — Progress Notes (Signed)
[facility] Progress Note   [name] QIW:[id number] DOB: 12-Nov-1965 DOA: 09/10/2014 PCP: No primary care provider on file.  Brief narrative: [name] is a 49 y.o. female with Lung cancer who was treated for it in [place]. She was told recently after a PET scan that she had a mass in her lung and needed further treatment but she decided to move her to live with family this past Sunday for family to help care for her. Her records were sent to Dr Julien Nordmann. She presented to the hospital on Tues with ongoing severe left chest pain and was admitted for pain control.   Subjective: Alert- pain is only about a 3. Cough has increased- I see yellow sputum in her basin today where yesterday it was clear. She has not vomiting blood in 2 days. Discussed plan for A-fib episode. For EGD today. Feels thirsty but not hungry still.   Assessment/Plan: Principal Problem:   Cancer associated pain? - she has been on narcotics for months for pain starting at her left lower rib cage and radiating upwards towards axilla- the pain was severe  - much better controlled- Fentanyl 75 mcg (outpt med), MS Contin 30 BID, MSIR 15 Q 4 PRN - still was requiring Dilaudid 1mg  Q3 PRN- increased Fentanyl to 100 mcg on 3/26- has not required Dilaudid since 9 AM on 3/26  Active Problems:  Hematemesis - no prior history of this - vomited blood three times on 3/25- transferred to SDU-  - started BID Prontonix, stoppped Heparin for DVT prophylaxis - was also given a dose of Toradol on 3/25 which could have contributed - GI plans on scope - may have radiation esophagitis  A-fib with RVR - placed on Cardizem infusion and when rate did not improve, on Amiodarone infusion - now back in NSR-  - will stop Cardizem - it is only on 5 mg /hr at this time - can continue Amio through the EGD and then consider stopping -ECHO does not reveal underlying cariac issues and I suspect this was all secondary to stress-  she is not an anticoagulation candidate due to hematemesis  Fever/ increased cough- sputum now yellow - started overnight- may have aspirated on vomitus- will need to start HCAP coverage as this is her 5th day in the hospital - obtain CXR    Lung cancer, primary, 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 - CT report- "bilobed 1.6 cm pleural based nodule in the left upper lobe concerning for malignancy. Probable necrotic lymph node in the AP window. Lucent lesion in left posterior ninth rib concerning for bony metastasis": -- Appt made for Dr Julien Nordmann for 4/5- if patient still in hospital, we are to call him next week    Anorexia-  - started Ensure and Megace-   Ongoing smoking - Nicotine patch    Code Status: full code Family Communication: brother Disposition Plan: home when stable DVT prophylaxis: SCDs Consultants: GI Procedures:  ECHO:  Left ventricle: LVEF is grossly normal at approximately 60% Diffiuclt to fully evaluate regional wall motion as endocardium is difficult to see well. The cavity size was normal. Wall thickness was normal. - Pulmonary arteries: PA peak pressure: 34 mm Hg (S).  Antibiotics: Anti-infectives    None  Objective: Filed Weights   09/12/14 2047 09/13/14 0401 09/14/14 0225  Weight: 59.3 kg (130 lb 11.7 oz) 60 kg (132 lb 4.4 oz) 58.9 kg (129 lb 13.6 oz)    Intake/Output Summary (Last 24 hours) at 09/14/14 0803 Last data filed at 09/14/14 0700  Gross per 24 hour  Intake 1955.1 ml  Output    950 ml  Net 1005.1 ml     Vitals Filed Vitals:   09/14/14 0400 09/14/14 0500 09/14/14 0600 09/14/14 0700  BP: 126/88 102/63 106/65 109/65  Pulse:      Temp: 100.3 F (37.9 C)     TempSrc: Oral     Resp: 17 21 18 20    Height:      Weight:      SpO2: 98% 97% 98% 97%    Exam:  General:  Pt is alert, calm, no distress  HEENT: No icterus, No thrush  Cardiovascular: regular rate and rhythm, S1/S2 No murmur  Respiratory: decreased breath sounds in RU lung field  Abdomen: Soft, +Bowel sounds, non tender, non distended, no guarding  MSK: No LE edema, cyanosis or clubbing  Data Reviewed: Basic Metabolic Panel:  Recent Labs Lab 09/10/14 1915 09/11/14 0335 09/13/14 0400 09/13/14 1712  NA 137 137 137 136  K 3.4* 3.6 4.1 3.9  CL 100 103 100 96  CO2 25 24 28 27   GLUCOSE 155* 120* 124* 109*  BUN 7 6 7 8   CREATININE 0.70 0.60 0.43* 0.46*  CALCIUM 9.6 9.0 8.6 9.1  MG  --   --   --  1.7   Liver Function Tests:  Recent Labs Lab 09/10/14 1915 09/11/14 0335  AST 18 12  ALT 8 8  ALKPHOS 95 86  BILITOT 0.6 0.9  PROT 7.5 6.7  ALBUMIN 3.8 3.4*    Recent Labs Lab 09/10/14 1915  LIPASE 13   No results for input(s): AMMONIA in the last 168 hours. CBC:  Recent Labs Lab 09/10/14 1915 09/11/14 0335 09/12/14 1720 09/13/14 0400 09/14/14 0330  WBC 14.2* 15.2* 11.0* 6.8 6.1  HGB 14.5 12.7 12.4 11.7* 10.5*  HCT 42.5 38.9 38.7 36.7 32.3*  MCV 86.4 86.8 87.6 87.4 87.3  PLT 358 442* 405* 349 278   Cardiac Enzymes:  Recent Labs Lab 09/11/14 0950 09/11/14 1540 09/13/14 1712 09/13/14 2259 09/14/14 0330  TROPONINI <0.03 <0.03 <0.03 <0.03 <0.03   BNP (last 3 results) No results for input(s): BNP in the last 8760 hours.  ProBNP (last 3 results) No results for input(s): PROBNP in the last 8760 hours.  CBG: No results for input(s): GLUCAP in the last 168 hours.  Recent Results (from the past 240 hour(s))  MRSA PCR Screening     Status: None   Collection Time: 09/12/14 10:27 PM  Result Value Ref Range Status   MRSA by PCR NEGATIVE NEGATIVE Final    Comment:        The GeneXpert MRSA Assay (FDA approved for NASAL specimens only), is one component of a comprehensive MRSA  colonization surveillance program. It is not intended to diagnose MRSA infection nor to guide or monitor treatment for MRSA infections.      Studies:  Recent x-ray studies have been reviewed in detail by the Attending Physician  Scheduled Meds:  Scheduled Meds: . antiseptic oral rinse  7 mL Mouth Rinse BID  . dextromethorphan-guaiFENesin  1 tablet Oral BID  . feeding supplement (ENSURE ENLIVE)  237 mL Oral TID BM  . fentaNYL  100 mcg Transdermal Q72H  .  megestrol  400 mg Oral BID  . morphine  30 mg Oral Q12H  . nicotine  14 mg Transdermal Daily  . ondansetron  4 mg Oral Q6H   Or  . ondansetron (ZOFRAN) IV  4 mg Intravenous Q6H  . pantoprazole (PROTONIX) IV  40 mg Intravenous Q12H  . polyethylene glycol  17 g Oral Daily  . senna-docusate  1 tablet Oral QHS  . sodium chloride  3 mL Intravenous Q12H   Continuous Infusions: . sodium chloride 1,000 mL (09/13/14 1643)  . amiodarone 30 mg/hr (09/13/14 2327)    Time spent on care of this patient: 28 min   [place], MD 09/14/2014, 8:03 AM  LOS: 3 days   [facility] Office  [phone] Pager - Text Page per [email or web address]  If 7PM-7AM, please contact night-coverage [email or web address]

## 2014-09-14 NOTE — Progress Notes (Signed)
[facility] Progress Note  Subjective: No more vomiting, no stools  Objective: Vital signs in last 24 hours: Temp:  [96.2 F (35.7 C)-100.3 F (37.9 C)] 98.5 F (36.9 C) (03/27 0800) Pulse Rate:  [80-207] 92 (03/27 1000) Resp:  [17-38] 25 (03/27 1000) BP: (85-158)/(58-111) 142/88 mmHg (03/27 1000) SpO2:  [86 %-100 %] 98 % (03/27 1000) Weight:  [58.9 kg (129 lb 13.6 oz)] 58.9 kg (129 lb 13.6 oz) (03/27 0225) Weight change: -0.4 kg (-14.1 oz)   PE: Unchanged  Lab Results: Results for orders placed or performed during the hospital encounter of 09/10/14 (from the past 24 hour(s))  Basic metabolic panel     Status: Abnormal   Collection Time: 09/13/14  5:12 PM  Result Value Ref Range   Sodium 136 135 - 145 mmol/L   Potassium 3.9 3.5 - 5.1 mmol/L   Chloride 96 96 - 112 mmol/L   CO2 27 19 - 32 mmol/L   Glucose, Bld 109 (H) 70 - 99 mg/dL   BUN 8 6 - 23 mg/dL   Creatinine, Ser 0.46 (L) 0.50 - 1.10 mg/dL   Calcium 9.1 8.4 - 10.5 mg/dL   GFR calc non Af Amer >90 >90 mL/min   GFR calc Af Amer >90 >90 mL/min   Anion gap 13 5 - 15  Magnesium     Status: None   Collection Time: 09/13/14  5:12 PM  Result Value Ref Range   Magnesium 1.7 1.5 - 2.5 mg/dL  Troponin I (q 6hr x 3)     Status: None   Collection Time: 09/13/14  5:12 PM  Result Value Ref Range   Troponin I <0.03 <0.031 ng/mL  Troponin I (q 6hr x 3)     Status: None   Collection Time: 09/13/14 10:59 PM  Result Value Ref Range   Troponin I <0.03 <0.031 ng/mL  Troponin I (q 6hr x 3)     Status: None   Collection Time: 09/14/14  3:30 AM  Result Value Ref Range   Troponin I <0.03 <0.031 ng/mL  CBC     Status: Abnormal   Collection Time: 09/14/14  3:30 AM  Result Value Ref Range   WBC 6.1 4.0 - 10.5 K/uL   RBC 3.70 (L) 3.87 - 5.11 MIL/uL   Hemoglobin 10.5 (L) 12.0 - 15.0 g/dL   HCT 32.3 (L) 36.0 - 46.0 %   MCV 87.3 78.0 - 100.0 fL   MCH 28.4 26.0 - 34.0 pg   MCHC 32.5 30.0 - 36.0 g/dL   RDW 14.7 11.5 - 15.5  %   Platelets 278 150 - 400 K/uL    Studies/Results: Dg Chest Port 1 View  09/14/2014   CLINICAL DATA:  Cough, left lung cancer  EXAM: PORTABLE CHEST - 1 VIEW  COMPARISON:  CT 09/10/2014, chest radiograph 09/10/2014.  FINDINGS: Right-sided Port-A-Cath in place with tip over the upper SVC. Left hemithoracic volume loss and postsurgical change reidentified. Interval development of ill-defined diffuse reticular interstitial pulmonary opacities. No new focal airspace disease.  IMPRESSION: Development of interstitial reticular opacities likely indicating interstitial edema.   Electronically Signed   By: Conchita Paris M.D.   On: 09/14/2014 09:52      Assessment: Severe erosive esophagitis seen on EGD no active bleeding suspect related in part to radiation for lung cancer also with significant hiatal hernia and probably significant reflux component.  Plan: Intensive PPI therapy. Leave NG tube out. Elevate head of bed will try clear liquid diet  PFYTW,KMQK C 09/14/2014, 10:08 AM

## 2014-09-14 NOTE — Op Note (Signed)
[facility] [place], [address]   ENDOSCOPY PROCEDURE REPORT  PATIENT: [name], [name]  MR#: [id number] BIRTHDATE: 1965-08-11 , 70  yrs. old GENDER: female ENDOSCOPIST: Teena Irani, MD REFERRED BY: PROCEDURE DATE:  27-Sep-2014 PROCEDURE: ASA CLASS: INDICATIONS:  upper GI bleeding MEDICATIONS: 50 g fentanyl 4 mg Versed 12 and half milligrams of Benadryl TOPICAL ANESTHETIC:  DESCRIPTION OF PROCEDURE: After the risks benefits and alternatives of the procedure were thoroughly explained, informed consent was obtained.  The [place] [id number] endoscope was introduced through the mouth and advanced to the second portion of the duodenum , Without limitations.  The instrument was slowly withdrawn as the mucosa was fully examined.    severe erosive esophagitis particularly in the mid esophagus with some darkish material appearing almost necrotic. No active bleeding no biopsies taken appearance less severe at the GE junction and proximal third of the esophagus. There was a large hiatal hernia distal to the squamocolumnar align. Stomach: Normal Duodenum: Normal              The scope was then withdrawn from the patient and the procedure completed.  COMPLICATIONS: There were no immediate complications.  ENDOSCOPIC IMPRESSION: very severe esophagitis possibly due to previous radiation combined with reflux  RECOMMENDATIONS: intensive PPI therapy, elevate head of bed, leave NG tube out, will try clear liquid diet. Watch for clinical signs of esophageal necrosis.  REPEAT EXAM:  eSigned:  Teena Irani, MD 27-Sep-2014 10:13 AM    CC:  CPT CODES: ICD CODES:  The ICD and CPT codes recommended by this software are interpretations from the data that the clinical staff has captured with the software.  The verification of the translation of this report to the ICD and CPT codes and modifiers is the sole responsibility of the health care  institution and practicing physician where this report was generated.  [facility] will not be held responsible for the validity of the ICD and CPT codes included on this report.  AMA assumes no liability for data contained or not contained herein. CPT is a Designer, television/film set of the [employer].

## 2014-09-14 NOTE — Progress Notes (Signed)
PT Cancellation Note  Patient Details Name: [name] MRN: [id number] DOB: 1966/04/07   Cancelled Treatment:    Reason Eval/Treat Not Completed: Medical issues which prohibited therapy;Other (comment)   Pt had endoscopy this am, also pt remains on BEDREST, will need updated activity orders prior to initiating PT eval;  RN made aware as well;   [facility] 09/14/2014, 12:34 PM

## 2014-09-14 NOTE — Progress Notes (Addendum)
Converted to NSR from Afib with RVR. Currently on Cardizem at 15mg .hr and Aminodarone at 60mg /hr. Received 2 runs of Potassium, and 2gms of Magnesium this evening.

## 2014-09-15 ENCOUNTER — Encounter (HOSPITAL_COMMUNITY): Payer: Self-pay | Admitting: Gastroenterology

## 2014-09-15 MED ORDER — METRONIDAZOLE 500 MG PO TABS
500.0000 mg | ORAL_TABLET | Freq: Three times a day (TID) | ORAL | Status: DC
  Administered 2014-09-15 – 2014-09-18 (×10): 500 mg via ORAL
  Filled 2014-09-15 (×12): qty 1

## 2014-09-15 MED ORDER — LEVOFLOXACIN 750 MG PO TABS
750.0000 mg | ORAL_TABLET | Freq: Every day | ORAL | Status: DC
  Administered 2014-09-15 – 2014-09-18 (×4): 750 mg via ORAL
  Filled 2014-09-15 (×5): qty 1

## 2014-09-15 MED ORDER — BOOST / RESOURCE BREEZE PO LIQD
1.0000 | Freq: Three times a day (TID) | ORAL | Status: DC
  Administered 2014-09-15 – 2014-09-30 (×20): 1 via ORAL

## 2014-09-15 NOTE — Progress Notes (Signed)
ANTIBIOTIC CONSULT NOTE - Follow Up  Pharmacy Consult for Vancomycin/Levaquin/Flagyl Indication: HCAP/aspiration PNA  Allergies  Allergen Reactions  . Penicillins Rash    Patient Measurements: Height: 5\' 7"  (170.2 cm) Weight: 135 lb 2.3 oz (61.3 kg) IBW/kg (Calculated) : 61.6   Vital Signs: Temp: 99.1 F (37.3 C) (03/28 0800) Temp Source: Oral (03/28 0800) BP: 108/66 mmHg (03/28 0600) Intake/Output from previous day: 03/27 0701 - 03/28 0700 In: 3452.6 [P.O.:420; I.V.:2432.6; IV Piggyback:600] Out: 1150 [Urine:1150] Intake/Output from this shift: Total I/O In: 100 [IV Piggyback:100] Out: 450 [Urine:450]  Labs:  Recent Labs  09/12/14 1720 09/13/14 0400 09/13/14 1712 09/14/14 0330  WBC 11.0* 6.8  --  6.1  HGB 12.4 11.7*  --  10.5*  PLT 405* 349  --  278  CREATININE  --  0.43* 0.46*  --    Estimated Creatinine Clearance: 84.1 mL/min (by C-G formula based on Cr of 0.46). No results for input(s): VANCOTROUGH, VANCOPEAK, VANCORANDOM, GENTTROUGH, GENTPEAK, GENTRANDOM, TOBRATROUGH, TOBRAPEAK, TOBRARND, AMIKACINPEAK, AMIKACINTROU, AMIKACIN in the last 72 hours.   Microbiology: Recent Results (from the past 720 hour(s))  MRSA PCR Screening     Status: None   Collection Time: 09/12/14 10:27 PM  Result Value Ref Range Status   MRSA by PCR NEGATIVE NEGATIVE Final    Comment:        The GeneXpert MRSA Assay (FDA approved for NASAL specimens only), is one component of a comprehensive MRSA colonization surveillance program. It is not intended to diagnose MRSA infection nor to guide or monitor treatment for MRSA infections.     Medical History: Past Medical History  Diagnosis Date  . Cancer     left bronchioles    Assessment: 49 yo female with lung cancer previously treated in [place] but now with a mass in her lung and requiring further treatment. Pt with fever and increased cough and overnight may have aspirated on vomitus. Vancomycin, Levaquin and Flagyl (MD  didn't want Clindamycin or Primaxin for anaerobic coverage).  3/27 Vanco >> >> 3/27 Levaquin >> >> 3/27 Flagyl >> >>  Tmax: 100.3 WBCs: 6.1 Renal: SCr low, CrCl>100 ml/min  No micro data.  Today is day #2 Vancomycin 500mg  IV Q8h/Levaquin 750mg  PO Q24/Flagyl 500mg  PO TID for HCAP, aspiration PNA. VT tonight.  Goal of Therapy:  Vancomycin trough level 15-20 mcg/ml  Doses adjusted per renal function Eradication of infection  Plan:  1.  Check vancomycin trough tonight and adjust dose per level. 2.  No dose adjustments to Levaquin, Flagyl.  Hershal Coria, PharmD, BCPS Pager: [phone] 09/15/2014 11:19 AM

## 2014-09-15 NOTE — Progress Notes (Addendum)
[facility] Progress Note   [name] ONG:[id number] DOB: 13-May-1966 DOA: 09/10/2014 PCP: No primary care provider on file.  Brief narrative: [name] is a 49 y.o. female with Lung cancer who was treated for it in [place]. She was told recently after a PET scan that she had a mass in her lung and needed further treatment but she decided to move her to live with family this past Sunday for family to help care for her. Her records were sent to Dr Julien Nordmann. She presented to the hospital on Tues with ongoing severe left chest pain and was admitted for pain control.   Subjective: Alert, pain is well controlled, no longer needing PRNs. Trying to drink clear liquids but not drinking much. Cough is the same, no dyspnea. Smiling and looking better today.   Assessment/Plan: Principal Problem:   Cancer associated pain? - she has been on narcotics for months for pain starting at her left lower rib cage and radiating upwards towards axilla- the pain was severe even prior to coming to [facility] per brother and patient - has a lesion in left post 9th rib which is the only imaging finding that could be casing her pain - much better controlled- Fentanyl 100 mcg (outpt med), MS Contin 30 BID, MSIR 15 Q 4 PRN  Active Problems:  Hematemesis - no prior history of this - vomited blood three times on 3/25- transferred to SDU-  - started BID Prontonix, stoppped Heparin for DVT prophylaxis - was also given a dose of Toradol on 3/25 which could have contributed - 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- recommended BID PPI- diet being advanced by GI  A-fib with RVR - placed on Cardizem infusion and when rate did not improve, on Amiodarone infusion - reconverted to NSR within 24 hrs-  - stopped Cardizem on 3/26 -  Amio infusion stopped after EGD  --ECHO does not reveal underlying cariac issues and I suspect this was all secondary to stress- she is not an  anticoagulation candidate due to hematemesis  Fever/ increased cough- sputum now yellow - started overnight after vomiting- may have aspirated on vomitus- will need to start HCAP (used Vanc/ Levaquin and Flagyl)  coverage symptoms started on her 5th day in the hospital - obtained CXR which showed diffuse infiltrates which were likely from aspiration pneumonitis - will try to stop Vanc and monitor clinically    Lung cancer, primary, 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 - CT report- "bilobed 1.6 cm pleural based nodule in the left upper lobe concerning for malignancy. Probable necrotic lymph node in the AP window. Lucent lesion in left posterior ninth rib concerning for bony metastasis": -- Appt made for Dr Julien Nordmann for 4/5-     Anorexia-  - started Ensure and Megace-   Ongoing smoking - Nicotine patch    Code Status: full code Family Communication: brother Disposition Plan: transfer out of SDU DVT prophylaxis: SCDs Consultants: GI Procedures:  1. EGD: very severe esophagitis possibly due to previous radiation combined with reflux  2. ECHO:  Left ventricle: LVEF is grossly normal at approximately 60% Diffiuclt to fully evaluate regional wall motion as endocardium is difficult to see well. The cavity size was normal. Wall thickness was normal. -  Pulmonary arteries: PA peak pressure: 34 mm Hg (S).  Antibiotics: Anti-infectives    Start     Dose/Rate Route Frequency Ordered Stop   09/15/14 1000  levofloxacin (LEVAQUIN) tablet 750 mg     750 mg Oral Daily 09/15/14 0751     09/15/14 0800  metroNIDAZOLE (FLAGYL) tablet 500 mg     500 mg Oral 3 times per day 09/15/14 0751     09/14/14 1000  metroNIDAZOLE (FLAGYL) IVPB 500 mg  Status:  Discontinued      500 mg 100 mL/hr over 60 Minutes Intravenous Every 8 hours 09/14/14 0845 09/15/14 0751   09/14/14 1000  levofloxacin (LEVAQUIN) IVPB 750 mg  Status:  Discontinued     750 mg 100 mL/hr over 90 Minutes Intravenous Every 24 hours 09/14/14 0847 09/15/14 0751   09/14/14 0900  vancomycin (VANCOCIN) 500 mg in sodium chloride 0.9 % 100 mL IVPB     500 mg 100 mL/hr over 60 Minutes Intravenous Every 8 hours 09/14/14 0850        Objective: Filed Weights   09/13/14 0401 09/14/14 0225 09/15/14 0347  Weight: 60 kg (132 lb 4.4 oz) 58.9 kg (129 lb 13.6 oz) 61.3 kg (135 lb 2.3 oz)    Intake/Output Summary (Last 24 hours) at 09/15/14 1254 Last data filed at 09/15/14 0958  Gross per 24 hour  Intake 2816.59 ml  Output   1325 ml  Net 1491.59 ml     Vitals Filed Vitals:   09/15/14 0400 09/15/14 0500 09/15/14 0600 09/15/14 0800  BP: 136/85 125/68 108/66   Pulse:      Temp: 99.1 F (37.3 C)   99.1 F (37.3 C)  TempSrc:    Oral  Resp: 27 26 23    Height:      Weight:      SpO2: 95% 96% 94%     Exam:  General:  Pt is alert, calm, no distress  HEENT: No icterus, No thrush  Cardiovascular: regular rate and rhythm, S1/S2 No murmur  Respiratory: decreased breath sounds in RU lung field- on 4 L O2- pulse ox 94% - drops to 88 % on 2 L when I try to wean it  Abdomen: Soft, +Bowel sounds, non tender, non distended, no guarding  MSK: No LE edema, cyanosis or clubbing  Data Reviewed: Basic Metabolic Panel:  Recent Labs Lab 09/10/14 1915 09/11/14 0335 09/13/14 0400 09/13/14 1712  NA 137 137 137 136  K 3.4* 3.6 4.1 3.9  CL 100 103 100 96  CO2 25 24 28 27   GLUCOSE 155* 120* 124* 109*  BUN 7 6 7 8   CREATININE 0.70 0.60 0.43* 0.46*  CALCIUM 9.6 9.0 8.6 9.1  MG  --   --   --  1.7   Liver Function Tests:  Recent Labs Lab 09/10/14 1915 09/11/14 0335  AST 18 12  ALT 8 8  ALKPHOS 95 86  BILITOT 0.6 0.9  PROT 7.5 6.7  ALBUMIN 3.8 3.4*    Recent Labs Lab 09/10/14 1915  LIPASE  13   No results for input(s): AMMONIA in the last 168 hours. CBC:  Recent Labs Lab 09/10/14 1915 09/11/14 0335 09/12/14 1720 09/13/14 0400 09/14/14 0330  WBC 14.2* 15.2* 11.0* 6.8 6.1  HGB 14.5 12.7 12.4 11.7* 10.5*  HCT 42.5 38.9 38.7 36.7 32.3*  MCV 86.4 86.8 87.6 87.4 87.3  PLT 358 442* 405* 349 278   Cardiac Enzymes:  Recent Labs Lab 09/11/14 0950 09/11/14 1540 09/13/14 1712 09/13/14 2259  09/14/14 0330  TROPONINI <0.03 <0.03 <0.03 <0.03 <0.03   BNP (last 3 results) No results for input(s): BNP in the last 8760 hours.  ProBNP (last 3 results) No results for input(s): PROBNP in the last 8760 hours.  CBG: No results for input(s): GLUCAP in the last 168 hours.  Recent Results (from the past 240 hour(s))  MRSA PCR Screening     Status: None   Collection Time: 09/12/14 10:27 PM  Result Value Ref Range Status   MRSA by PCR NEGATIVE NEGATIVE Final    Comment:        The GeneXpert MRSA Assay (FDA approved for NASAL specimens only), is one component of a comprehensive MRSA colonization surveillance program. It is not intended to diagnose MRSA infection nor to guide or monitor treatment for MRSA infections.      Studies:  Recent x-ray studies have been reviewed in detail by the Attending Physician  Scheduled Meds:  Scheduled Meds: . antiseptic oral rinse  7 mL Mouth Rinse BID  . dextromethorphan-guaiFENesin  1 tablet Oral BID  . feeding supplement (ENSURE ENLIVE)  237 mL Oral TID BM  . feeding supplement (RESOURCE BREEZE)  1 Container Oral TID BM  . fentaNYL  100 mcg Transdermal Q72H  . levofloxacin  750 mg Oral Daily  . megestrol  400 mg Oral BID  . metroNIDAZOLE  500 mg Oral 3 times per day  . morphine  30 mg Oral Q12H  . nicotine  14 mg Transdermal Daily  . ondansetron  4 mg Oral Q6H   Or  . ondansetron (ZOFRAN) IV  4 mg Intravenous Q6H  . pantoprazole (PROTONIX) IV  40 mg Intravenous Q12H  . polyethylene glycol  17 g Oral Daily  .  senna-docusate  1 tablet Oral QHS  . sodium chloride  3 mL Intravenous Q12H  . vancomycin  500 mg Intravenous Q8H   Continuous Infusions:    Time spent on care of this patient: 35 min   [place], MD 09/15/2014, 12:54 PM  LOS: 4 days   [facility] Office  [phone] Pager - Text Page per [email or web address]  If 7PM-7AM, please contact night-coverage [email or web address]

## 2014-09-15 NOTE — Progress Notes (Signed)
[facility] Progress Note  Subjective: Feels okay to day, no odynophagia nausea or chest pain, tolerating clear liquid diet  Objective: Vital signs in last 24 hours: Temp:  [98.3 F (36.8 Conway)-99.8 F (37.7 Conway)] 99.1 F (37.3 Conway) (03/28 0400) Pulse Rate:  [80-92] 84 (03/27 1025) Resp:  [16-30] 23 (03/28 0600) BP: (100-146)/(59-88) 108/66 mmHg (03/28 0600) SpO2:  [88 %-100 %] 94 % (03/28 0600) Weight:  [61.3 kg (135 lb 2.3 oz)] 61.3 kg (135 lb 2.3 oz) (03/28 0347) Weight change: 2.4 kg (5 lb 4.7 oz)   PE: Looks a bit more alert and chipper today  Lab Results: No results found for this or any previous visit (from the past 24 hour(s)).  Studies/Results: Dg Chest Port 1 View  09/14/2014   CLINICAL DATA:  Cough, left lung cancer  EXAM: PORTABLE CHEST - 1 VIEW  COMPARISON:  CT 09/10/2014, chest radiograph 09/10/2014.  FINDINGS: Right-sided Port-A-Cath in place with tip over the upper SVC. Left hemithoracic volume loss and postsurgical change reidentified. Interval development of ill-defined diffuse reticular interstitial pulmonary opacities. No new focal airspace disease.  IMPRESSION: Development of interstitial reticular opacities likely indicating interstitial edema.   Electronically Signed   By: Conchita Paris M.D.   On: 09/14/2014 09:52      Assessment: Very severe esophagitis presumed possibly related to radiation therapy from lung cancer although several months out from this, also probable component of gastroesophageal reflux.  Plan: Continue double dose proton pump inhibitor, elevate head of bed Will try full liquid diet    [name] 09/15/2014, 8:28 AM

## 2014-09-15 NOTE — Progress Notes (Signed)
CARE MANAGEMENT NOTE 09/15/2014  Patient:  [name], [name]   Account Number:  [id number]  Date Initiated:  09/15/2014  Documentation initiated by:  Gjon Letarte  Subjective/Objective Assessment:   gi bld with chest pain -tropnoin neg/ hgb stable edg severe corrorisive esophagitis with necrosis Lynwood Dawley     Action/Plan:   home when stable   Anticipated DC Date:  09/16/2014   Anticipated DC Plan:  HOME/SELF CARE  In-house referral  NA      DC Planning Services  CM consult      PAC Choice  NA   Choice offered to / List presented to:  NA      DME agency  NA        [place] agency  NA   Status of service:  In process, will continue to follow Medicare Important Message given?   (If response is "NO", the following Medicare IM given date fields will be blank) Date Medicare IM given:   Medicare IM given by:   Date Additional Medicare IM given:   Additional Medicare IM given by:    Discharge Disposition:    Per UR Regulation:  Reviewed for med. necessity/level of care/duration of stay  If discussed at [place] of Stay Meetings, dates discussed:    Comments:  September 15, 2014/Marcin Holte L. Rosana Hoes, RN, [name], CCM. Case Management [place] [phone] No discharge needs present of time of review.

## 2014-09-15 NOTE — Evaluation (Signed)
Physical Therapy Evaluation Patient Details Name: [name] MRN: [id number] DOB: Jul 22, 1965 Today's Date: 09/15/2014   History of Present Illness  [name] is a 49 y.o. female with Past medical history of left lung cancer status post chemoradiation September 2015. Admitted 09/10/14 with c/o chest pain., recurrence of lung cancer. Occurence of afib, s/p EGD on 09/14/14.  Clinical Impression  Patient presents with generalized weakness, reoccurrence of Lung cancer, On 4 liters of oxygen. Patient will benefit from PT while in acute care to address problems listed in note below. Recommend OT consult. Sats > 90%, HR 115.     Follow Up Recommendations Home health PT;Supervision/Assistance - 24 hour    Equipment Recommendations  Rolling walker with 5" wheels    Recommendations for Other Services OT consult     Precautions / Restrictions Precautions Precaution Comments: monitor  sats Restrictions Weight Bearing Restrictions: No      Mobility  Bed Mobility Overal bed mobility: Needs Assistance Bed Mobility: Supine to Sit;Sit to Supine     Supine to sit: Min guard Sit to supine: Min guard      Transfers Overall transfer level: Needs assistance Equipment used: 1 person hand held assist Transfers: Sit to/from Stand Sit to Stand: Min assist         General transfer comment: cues for safety  Ambulation/Gait Ambulation/Gait assistance: Min assist Ambulation Distance (Feet): 15 Feet Assistive device: 1 person hand held assist Gait Pattern/deviations: Step-through pattern     General Gait Details: steady assist for  gait,   Stairs            Wheelchair Mobility    Modified Rankin (Stroke Patients Only)       Balance                                             Pertinent Vitals/Pain Pain Assessment: 0-10 Pain Score: 3  Pain Location: chest area. Pain Descriptors / Indicators: Discomfort Pain Intervention(s):  Premedicated before session;Monitored during session    Home Living Family/patient expects to be discharged to:: Private residence Living Arrangements: Other relatives Available Help at Discharge: Family Type of Home: House Home Access: Ramped entrance;Stairs to enter Entrance Stairs-Rails: Right Entrance Stairs-Number of Steps: 3 Home Layout: Two level;Bed/bath upstairs Home Equipment: Shower seat Additional Comments: just moved in with brother   recently    Prior Function Level of Independence: Independent               Hand Dominance        Extremity/Trunk Assessment   Upper Extremity Assessment: Generalized weakness           Lower Extremity Assessment: Generalized weakness      Cervical / Trunk Assessment: Normal  Communication   Communication: No difficulties  Cognition Arousal/Alertness: Awake/alert Behavior During Therapy: WFL for tasks assessed/performed Overall Cognitive Status: Within Functional Limits for tasks assessed                      General Comments      Exercises        Assessment/Plan    PT Assessment    PT Diagnosis Difficulty walking;Generalized weakness   PT Problem List    PT Treatment Interventions     PT Goals (Current goals can be found in the Care Plan section) Acute Rehab PT Goals Patient Stated  Goal: to get stronger PT Goal Formulation: With patient Time For Goal Achievement: 09/29/14 Potential to Achieve Goals: Good    Frequency     Barriers to discharge        Co-evaluation               End of Session   Activity Tolerance: Patient tolerated treatment well Patient left: in bed;with call bell/phone within reach Nurse Communication: Mobility status         Time: 1033-1050 PT Time Calculation (min) (ACUTE ONLY): 17 min   Charges:   PT Evaluation $Initial PT Evaluation Tier I: 1 Procedure     PT G CodesClaretha Conway 09/15/2014, 11:49 AM [name]  PT [phone]

## 2014-09-16 DIAGNOSIS — J189 Pneumonia, unspecified organism: Secondary | ICD-10-CM

## 2014-09-16 LAB — BASIC METABOLIC PANEL
ANION GAP: 6 (ref 5–15)
Anion gap: 7 (ref 5–15)
BUN: 8 mg/dL (ref 6–23)
BUN: 5 mg/dL — AB (ref 6–23)
CALCIUM: 7.6 mg/dL — AB (ref 8.4–10.5)
CO2: 31 mmol/L (ref 19–32)
CO2: 28 mmol/L (ref 19–32)
Calcium: 7.9 mg/dL — ABNORMAL LOW (ref 8.4–10.5)
Chloride: 93 mmol/L — ABNORMAL LOW (ref 96–112)
Chloride: 99 mmol/L (ref 96–112)
Creatinine, Ser: 0.43 mg/dL — ABNORMAL LOW (ref 0.50–1.10)
Creatinine, Ser: 0.33 mg/dL — ABNORMAL LOW (ref 0.50–1.10)
GFR calc Af Amer: >90 mL/min (ref >90)
GFR calc non Af Amer: >90 mL/min (ref >90)
GFR calc non Af Amer: >90 mL/min (ref >90)
GLUCOSE: 93 mg/dL (ref 70–99)
Glucose, Bld: 106 mg/dL — ABNORMAL HIGH (ref 70–99)
Potassium: 2.9 mmol/L — ABNORMAL LOW (ref 3.5–5.1)
Potassium: 3.5 mmol/L (ref 3.5–5.1)
SODIUM: 133 mmol/L — AB (ref 135–145)
Sodium: 131 mmol/L — ABNORMAL LOW (ref 135–145)

## 2014-09-16 LAB — CBC
HCT: 33.9 % — ABNORMAL LOW (ref 36.0–46.0)
Hemoglobin: 10.9 g/dL — ABNORMAL LOW (ref 12.0–15.0)
MCH: 27.7 pg (ref 26.0–34.0)
MCHC: 32.2 g/dL (ref 30.0–36.0)
MCV: 86.3 fL (ref 78.0–100.0)
Platelets: 302 10*3/uL (ref 150–400)
RBC: 3.93 MIL/uL (ref 3.87–5.11)
RDW: 15 % (ref 11.5–15.5)
WBC: 8 10*3/uL (ref 4.0–10.5)

## 2014-09-16 LAB — MAGNESIUM: MAGNESIUM: 1.4 mg/dL — AB (ref 1.5–2.5)

## 2014-09-16 MED ORDER — SODIUM CHLORIDE 0.9 % IV SOLN
INTRAVENOUS | Status: DC
  Administered 2014-09-16 (×2): via INTRAVENOUS
  Administered 2014-09-16: 1000 mL via INTRAVENOUS
  Administered 2014-09-16 – 2014-09-17 (×2): via INTRAVENOUS

## 2014-09-16 MED ORDER — POTASSIUM CHLORIDE 10 MEQ/100ML IV SOLN
10.0000 meq | INTRAVENOUS | Status: AC
  Administered 2014-09-16 (×4): 10 meq via INTRAVENOUS
  Filled 2014-09-16 (×4): qty 100

## 2014-09-16 MED ORDER — SODIUM CHLORIDE 0.9 % IV BOLUS (SEPSIS)
500.0000 mL | Freq: Once | INTRAVENOUS | Status: AC
  Administered 2014-09-16: 500 mL via INTRAVENOUS

## 2014-09-16 MED ORDER — METOPROLOL SUCCINATE ER 25 MG PO TB24
25.0000 mg | ORAL_TABLET | Freq: Every day | ORAL | Status: DC
  Administered 2014-09-16 – 2014-09-17 (×2): 25 mg via ORAL
  Filled 2014-09-16 (×2): qty 1

## 2014-09-16 MED ORDER — AMIODARONE HCL IN DEXTROSE 360-4.14 MG/200ML-% IV SOLN
60.0000 mg/h | INTRAVENOUS | Status: DC
  Administered 2014-09-16: 60 mg/h via INTRAVENOUS
  Filled 2014-09-16: qty 200

## 2014-09-16 MED ORDER — AMIODARONE HCL IN DEXTROSE 360-4.14 MG/200ML-% IV SOLN
30.0000 mg/h | INTRAVENOUS | Status: DC
  Filled 2014-09-16: qty 200

## 2014-09-16 MED ORDER — MAGNESIUM SULFATE 2 GM/50ML IV SOLN: 2.0000 g | Freq: Once | INTRAVENOUS | Status: DC

## 2014-09-16 MED ORDER — AMIODARONE LOAD VIA INFUSION
150.0000 mg | Freq: Once | INTRAVENOUS | Status: AC
  Administered 2014-09-16: 150 mg via INTRAVENOUS
  Filled 2014-09-16: qty 83.34

## 2014-09-16 MED ORDER — AMIODARONE HCL IN DEXTROSE 360-4.14 MG/200ML-% IV SOLN
60.0000 mg/h | Freq: Once | INTRAVENOUS | Status: DC
  Filled 2014-09-16: qty 200

## 2014-09-16 MED ORDER — MAGNESIUM SULFATE IN D5W 10-5 MG/ML-% IV SOLN
1.0000 g | Freq: Once | INTRAVENOUS | Status: AC
  Administered 2014-09-16: 1 g via INTRAVENOUS
  Filled 2014-09-16: qty 100

## 2014-09-16 MED ORDER — LEVALBUTEROL HCL 0.63 MG/3ML IN NEBU
0.6300 mg | INHALATION_SOLUTION | RESPIRATORY_TRACT | Status: DC | PRN
  Administered 2014-09-17: 0.63 mg via RESPIRATORY_TRACT
  Filled 2014-09-16: qty 3

## 2014-09-16 MED ORDER — DIGOXIN 0.25 MG/ML IJ SOLN
0.1250 mg | Freq: Once | INTRAMUSCULAR | Status: AC
  Administered 2014-09-16: 0.125 mg via INTRAVENOUS
  Filled 2014-09-16: qty 0.5

## 2014-09-16 MED ORDER — AMIODARONE HCL IN DEXTROSE 360-4.14 MG/200ML-% IV SOLN
30.0000 mg/h | INTRAVENOUS | Status: DC
  Administered 2014-09-16 – 2014-09-17 (×4): 30 mg/h via INTRAVENOUS
  Filled 2014-09-16 (×3): qty 200

## 2014-09-16 MED ORDER — POTASSIUM CHLORIDE 20 MEQ/15ML (10%) PO SOLN
40.0000 meq | Freq: Once | ORAL | Status: AC
  Administered 2014-09-16: 40 meq via ORAL
  Filled 2014-09-16: qty 30

## 2014-09-16 NOTE — Progress Notes (Addendum)
NP paged by RN secondary to pt having tachycardia and EKG showed Afib with RVR in the 140s. Pt had this on admission and was started on Cardizem and amiodorone. She converted back to NSR and these meds were stopped. Echo was neg for acute finding. Afib was thought to be secondary to acute illness/fever at that time. Pt not an anticoagulation candidate due to cancer with hemoptysis. Plan: BP is too low at present for BB or Cardizem. Pt is not on tele floor. She is not taking much by mouth. Will give a 500 cc NS bolus and move to tele floor. At that time, will give one time dose of Digoxin IV and see if this slows HR. If not, will recheck BP once on tele floor and can start BB or Cardizem if BP is holding. Plan discussed with RN. Will follow.  Clance Boll, NP [facility] Update: After bolus and Digoxin, HR remains 130s. BP in 90s. Pt is asymptomatic. On tele floor now. Afib with RVR on tele. Will start amiodorone drip and follow. Maintenance IVF at 75/hr.  KJKG, NP Update: HR trending down with drip. BP still low. 500cc bolus repeated and IVF increased to 125cc/hr. K and Mg low, ordered replacement. Pt remains asymptomatic.  KJKG, NP Update: Pt converted back to NSR around 5am. Kept drip going for one hour and HR continues to be NSR. Taper drip to 30mg /hr now and if HR still holding in one hour, can d/c drip. Report to attending and RN is aware of plan to wean drip. BP coming up some. KJKG, NP

## 2014-09-16 NOTE — Progress Notes (Signed)
Received patient via bed accompanied by RN, awake, alert and oriented. No complaints of chest pain, no complaints of dizziness, just complained of being weak.. Placed on telemetry, showing atrial fib with RVR. Digoxin IV given as ordered. I agree to RN's previious assesment. We will continue to monitor patient.

## 2014-09-16 NOTE — Progress Notes (Signed)
Patient has converted back to NSR from A.fib with RVR. Still on amidorane drip. Tylene Fantasia NP was made aware.

## 2014-09-16 NOTE — Consult Note (Addendum)
CARDIOLOGY CONSULT NOTE     Patient ID: [name] MRN: [id number] DOB/AGE: Dec 16, 1965 49 y.o.  Admit date: 09/10/2014 Referring Physician Debbe Odea MD Primary Physician No primary care provider on file. Primary Cardiologist New Reason for Consultation paroxysmal AFib.   HPI: 49 yo WF seen at the request of the hospitalist service for evaluation of atrial fibrillation. She has a history of lung CA with metastasis. She was admitted 09/11/14 with refractory pain and SOB. She developed hematemesis and underwent EGD showing severe esophagitis related to prior RT. She later developed atrial fibrillation with RVR. She did not improve on IV Cardizem and was loaded with amiodarone with conversion to NSR. This was then stopped but she had recurrent Afib with RVR. She was started back on IV amiodarone early this am and converted to NSR at about 7 am. She is completely unaware of arrhythmia. No chest pain, palpitations. No dizziness or syncope. No prior cardiac history. Reports she just recently quit smoking.   Past Medical History  Diagnosis Date  . Cancer     left bronchioles    History reviewed. No pertinent family history.  History   Social History  . Marital Status: Single    Spouse Name: N/A  . Number of Children: N/A  . Years of Education: N/A   Occupational History  . Not on file.   Social History Main Topics  . Smoking status: Former Research scientist (life sciences)  . Smokeless tobacco: Not on file  . Alcohol Use: No  . Drug Use: No  . Sexual Activity: Not on file   Other Topics Concern  . Not on file   Social History Narrative    Past Surgical History  Procedure Laterality Date  . Esophagogastroduodenoscopy N/A 09/14/2014    Procedure: ESOPHAGOGASTRODUODENOSCOPY (EGD);  Surgeon: Teena Irani, MD;  Location: [facility];  Service: Endoscopy;  Laterality: N/A;     Prescriptions prior to admission  Medication Sig Dispense Refill Last Dose  . fentaNYL (DURAGESIC - DOSED MCG/HR) 75  MCG/HR Place 75 mcg onto the skin every 3 (three) days.   09/10/2014  . oxycodone (ROXICODONE) 30 MG immediate release tablet Take 1 tablet by mouth every 8 (eight) hours as needed for pain.    09/10/2014      ROS: As noted in HPI. All other systems are reviewed and are negative unless otherwise mentioned.   Physical Exam: Blood pressure 102/67, pulse 78, temperature 98.4 F (36.9 C), temperature source Oral, resp. rate 18, height 5\' 7"  (1.702 m), weight 137 lb 12.6 oz (62.5 kg), SpO2 94 %. Current Weight  09/16/14 137 lb 12.6 oz (62.5 kg)    GENERAL: Chronically ill appearing WF in NAD. HEENT:  PERRL, EOMI, sclera are clear. Oropharynx is clear. NECK:  No jugular venous distention, carotid upstroke brisk and symmetric, no bruits, no thyromegaly or adenopathy LUNGS:  Clear to auscultation bilaterally, decreased BS bilateral.  CHEST:  Unremarkable HEART:  RRR,  PMI not displaced or sustained,S1 and S2 within normal limits, no S3, no S4: no clicks, no rubs, no murmurs ABD:  Soft, nontender. BS +, no masses or bruits. No hepatomegaly, no splenomegaly EXT:  2 + pulses throughout, no edema, no cyanosis no clubbing SKIN:  Warm and dry.  No rashes NEURO:  Alert and oriented x 3. Cranial nerves II through XII intact. PSYCH:  Cognitively intact    Labs:   Lab Results  Component Value Date   WBC 8.0 09/16/2014   HGB 10.9* 09/16/2014   HCT  33.9* 09/16/2014   MCV 86.3 09/16/2014   PLT 302 09/16/2014    Recent Labs Lab 09/11/14 0335  09/16/14 0100  NA 137  < > 131*  K 3.6  < > 2.9*  CL 103  < > 93*  CO2 24  < > 31  BUN 6  < > 8  CREATININE 0.60  < > 0.43*  CALCIUM 9.0  < > 7.9*  PROT 6.7  --   --   BILITOT 0.9  --   --   ALKPHOS 86  --   --   ALT 8  --   --   AST 12  --   --   GLUCOSE 120*  < > 93  < > = values in this interval not displayed. Lab Results  Component Value Date   TROPONINI <0.03 09/14/2014   TROPONINI <0.03 09/13/2014   TROPONINI <0.03 09/13/2014   No  results found for: CHOL No results found for: HDL No results found for: LDLCALC No results found for: TRIG No results found for: CHOLHDL No results found for: LDLDIRECT  No results found for: PROBNP No results found for: TSH No results found for: HGBA1C  Radiology: No results found.  EKG: 09/12/14: NSR with normal Ecg.  Echo: 09/12/14:Study Conclusions  - Left ventricle: LVEF is grossly normal at approximately 60% Diffiuclt to fully evaluate regional wall motion as endocardium is difficult to see well. The cavity size was normal. Wall thickness was normal. - Pulmonary arteries: PA peak pressure: 34 mm Hg (S).  ASSESSMENT AND PLAN:  1. Atrial fibrillation with RVR. Probably related to stress of underlying pulmonary problems. [place] Vasc score of 1. Not a candidate for anticoagulation due to recent GI bleed. Will continue amiodarone load today. Plan to transition to po in am. Will start Toprol XL 25 mg daily. 2. Metastatic Lung CA. 3. 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 Severe hypokalemia-replete.  Will follow with you   Signed: Peter [place], [place]  09/16/2014, 3:52 PM

## 2014-09-16 NOTE — Progress Notes (Addendum)
[facility] Progress Note   Charnell Peplinski ATF:[id number] DOB: Dec 30, 1965 DOA: 09/10/2014 PCP: No primary care provider on file.  Brief narrative: [name] is a 49 y.o. female with Lung cancer who was treated for it in [place]. She was told recently after a PET scan that she had a mass in her lung and needed further treatment but she decided to move here this past Sunday to liver with her brother to help care for her. Her records were sent to Dr Julien Nordmann. She presented to the hospital on Tues with ongoing severe left chest pain and was admitted for pain control.   Subjective: Alert, pain continues to be well controlled. Still not drinking much- cough with yellow sputum persists- no dyspnea- agrees to try full liquids and agrees with increasing ambulation.   Assessment/Plan: Principal Problem:   Cancer associated pain - she has been on narcotics for months for pain starting at her left lower rib cage and radiating upwards towards axilla- the pain was severe even prior to coming to [facility] per brother and patient - has a lesion in left post 9th rib which is the only imaging finding that could be casing her pain - much better controlled- Fentanyl 100 mcg (was on 75 as outp)- MS Contin 30 BID  MSIR 15 Q 4 PRN are new which I started in house  Active Problems:  Hematemesis - no prior history of this - vomited blood three times on 3/25- transferred to SDU-  - started BID Prontonix, stoppped Heparin for DVT prophylaxis - was also given a dose of Toradol on 3/25 which could have contributed - 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- recommended BID PPI- GI recommended advance to full liquids - will do it today  A-fib with RVR - placed on Cardizem infusion and when rate did not improve, on Amiodarone infusion - reconverted to NSR within 24 hrs-  - stopped Cardizem on 3/26 -  Amio infusion stopped after EGD  --ECHO does not reveal underlying cariac  issues and I suspected this was all secondary to stress  - she went back into A-fib last night and was put back on IV Amio by night cover NP- I have asked cardiology to see her to transition to orals and to f/u as outpt- she is currently not an anticoagulation candidate due to hematemesis and severe radiation esophagitis but may be a candidate in the future  Fever/ increased cough- acute hypoxic resp failure -  Has had a lot of cough with white sputum and likely has chronic bronchitis but sputum now yellow- - started overnight after vomiting- may have aspirated on vomitus- started HCAP coverage (used Vanc/ Levaquin and Flagyl) on 3/27 as symptoms started on her 5th day in the hospital - obtained CXR which showed diffuse infiltrates which were likely from aspiration pneumonitis - stopped Vanc on 3/28- remains stable- would give 5-7 days of abx based upon symptoms - wean O 2 as able - was not on it at home - needs outpt PFTs- chronic smoker    Lung cancer, primary, 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 - CT report- "bilobed 1.6 cm pleural based nodule in the left upper lobe concerning for malignancy. Probable necrotic lymph node in the AP window.  Lucent lesion in left posterior ninth rib concerning for bony metastasis": -- Appt made for Dr Julien Nordmann for 4/5-     Anorexia-  - started Ensure and Megace-   Ongoing smoking - Nicotine patch    Code Status: full code Family Communication: brother Disposition Plan: transfer out of SDU DVT prophylaxis: SCDs Consultants: GI Procedures:  1. EGD: very severe esophagitis possibly due to previous radiation combined with reflux  2. ECHO:  Left ventricle: LVEF is grossly normal at approximately 60% Diffiuclt to fully  evaluate regional wall motion as endocardium is difficult to see well. The cavity size was normal. Wall thickness was normal. - Pulmonary arteries: PA peak pressure: 34 mm Hg (S).  Antibiotics: Anti-infectives    Start     Dose/Rate Route Frequency Ordered Stop   09/15/14 1000  levofloxacin (LEVAQUIN) tablet 750 mg     750 mg Oral Daily 09/15/14 0751     09/15/14 0800  metroNIDAZOLE (FLAGYL) tablet 500 mg     500 mg Oral 3 times per day 09/15/14 0751     09/14/14 1000  metroNIDAZOLE (FLAGYL) IVPB 500 mg  Status:  Discontinued     500 mg 100 mL/hr over 60 Minutes Intravenous Every 8 hours 09/14/14 0845 09/15/14 0751   09/14/14 1000  levofloxacin (LEVAQUIN) IVPB 750 mg  Status:  Discontinued     750 mg 100 mL/hr over 90 Minutes Intravenous Every 24 hours 09/14/14 0847 09/15/14 0751   09/14/14 0900  vancomycin (VANCOCIN) 500 mg in sodium chloride 0.9 % 100 mL IVPB  Status:  Discontinued     500 mg 100 mL/hr over 60 Minutes Intravenous Every 8 hours 09/14/14 0850 09/15/14 1301      Objective: Filed Weights   09/15/14 0347 09/15/14 1215 09/16/14 0559  Weight: 61.3 kg (135 lb 2.3 oz) 63.2 kg (139 lb 5.3 oz) 62.5 kg (137 lb 12.6 oz)    Intake/Output Summary (Last 24 hours) at 09/16/14 1405 Last data filed at 09/16/14 1215  Gross per 24 hour  Intake   2250 ml  Output   1350 ml  Net    900 ml     Vitals Filed Vitals:   09/16/14 0455 09/16/14 0559 09/16/14 0929 09/16/14 1207  BP: 87/53 91/69 113/70 121/75  Pulse: 79 80 78 75  Temp:  98.5 F (36.9 C) 98.2 F (36.8 C)   TempSrc:  Oral Oral   Resp:   18   Height:      Weight:  62.5 kg (137 lb 12.6 oz)    SpO2:  97%      Exam:  General:  Pt is alert, calm, no distress  HEENT: No icterus, No thrush  Cardiovascular: regular rate and rhythm, S1/S2 No murmur  Respiratory: decreased breath sounds in RU lung field- on 4 L O2- pulse ox 97% -  Abdomen: Soft, +Bowel sounds, non tender, non distended, no guarding  MSK: No  LE edema, cyanosis or clubbing  Data Reviewed: Basic Metabolic Panel:  Recent Labs Lab 09/10/14 1915 09/11/14 0335 09/13/14 0400 09/13/14 1712 09/16/14 0100  NA 137 137 137 136 131*  K 3.4* 3.6 4.1 3.9 2.9*  CL 100 103 100 96 93*  CO2 25 24 28 27 31   GLUCOSE 155* 120* 124* 109* 93  BUN 7 6 7 8 8   CREATININE 0.70 0.60 0.43* 0.46* 0.43*  CALCIUM 9.6 9.0 8.6 9.1 7.9*  MG  --   --   --  1.7 1.4*   Liver Function  Tests:  Recent Labs Lab 09/10/14 1915 09/11/14 0335  AST 18 12  ALT 8 8  ALKPHOS 95 86  BILITOT 0.6 0.9  PROT 7.5 6.7  ALBUMIN 3.8 3.4*    Recent Labs Lab 09/10/14 1915  LIPASE 13   No results for input(s): AMMONIA in the last 168 hours. CBC:  Recent Labs Lab 09/11/14 0335 09/12/14 1720 09/13/14 0400 09/14/14 0330 09/16/14 0100  WBC 15.2* 11.0* 6.8 6.1 8.0  HGB 12.7 12.4 11.7* 10.5* 10.9*  HCT 38.9 38.7 36.7 32.3* 33.9*  MCV 86.8 87.6 87.4 87.3 86.3  PLT 442* 405* 349 278 302   Cardiac Enzymes:  Recent Labs Lab 09/11/14 0950 09/11/14 1540 09/13/14 1712 09/13/14 2259 09/14/14 0330  TROPONINI <0.03 <0.03 <0.03 <0.03 <0.03   BNP (last 3 results) No results for input(s): BNP in the last 8760 hours.  ProBNP (last 3 results) No results for input(s): PROBNP in the last 8760 hours.  CBG: No results for input(s): GLUCAP in the last 168 hours.  Recent Results (from the past 240 hour(s))  MRSA PCR Screening     Status: None   Collection Time: 09/12/14 10:27 PM  Result Value Ref Range Status   MRSA by PCR NEGATIVE NEGATIVE Final    Comment:        The GeneXpert MRSA Assay (FDA approved for NASAL specimens only), is one component of a comprehensive MRSA colonization surveillance program. It is not intended to diagnose MRSA infection nor to guide or monitor treatment for MRSA infections.      Studies:  Recent x-ray studies have been reviewed in detail by the Attending Physician  Scheduled Meds:  Scheduled Meds: . amiodarone   60 mg/hr Intravenous Once  . antiseptic oral rinse  7 mL Mouth Rinse BID  . dextromethorphan-guaiFENesin  1 tablet Oral BID  . feeding supplement (ENSURE ENLIVE)  237 mL Oral TID BM  . feeding supplement (RESOURCE BREEZE)  1 Container Oral TID BM  . fentaNYL  100 mcg Transdermal Q72H  . levofloxacin  750 mg Oral Daily  . megestrol  400 mg Oral BID  . metroNIDAZOLE  500 mg Oral 3 times per day  . morphine  30 mg Oral Q12H  . nicotine  14 mg Transdermal Daily  . ondansetron  4 mg Oral Q6H   Or  . ondansetron (ZOFRAN) IV  4 mg Intravenous Q6H  . pantoprazole (PROTONIX) IV  40 mg Intravenous Q12H  . polyethylene glycol  17 g Oral Daily  . senna-docusate  1 tablet Oral QHS  . sodium chloride  3 mL Intravenous Q12H   Continuous Infusions: . sodium chloride 125 mL/hr at 09/16/14 1208  . amiodarone 30 mg/hr (09/16/14 0700)    Time spent on care of this patient: 8 min   [place], MD 09/16/2014, 2:05 PM  LOS: 5 days   [facility] Office  [phone] Pager - Text Page per [email or web address]  If 7PM-7AM, please contact night-coverage [email or web address]

## 2014-09-16 NOTE — Progress Notes (Signed)
Transferred patient to room 1415 for telemetry monitoring. Report given to [facility]. Patient still asymptomatic.

## 2014-09-16 NOTE — Progress Notes (Signed)
Patient with very fast irregular heartbeat. Did EKG shows Afib with RVR. Pulse in 150's. Paged NP Baltazar Najjar.

## 2014-09-17 ENCOUNTER — Inpatient Hospital Stay (HOSPITAL_COMMUNITY): Payer: 59

## 2014-09-17 DIAGNOSIS — I4891 Unspecified atrial fibrillation: Secondary | ICD-10-CM

## 2014-09-17 MED ORDER — FUROSEMIDE 10 MG/ML IJ SOLN
20.0000 mg | Freq: Once | INTRAMUSCULAR | Status: AC
  Administered 2014-09-17: 20 mg via INTRAVENOUS
  Filled 2014-09-17: qty 2

## 2014-09-17 MED ORDER — IPRATROPIUM-ALBUTEROL 0.5-2.5 (3) MG/3ML IN SOLN
3.0000 mL | RESPIRATORY_TRACT | Status: DC
  Administered 2014-09-17 – 2014-09-18 (×3): 3 mL via RESPIRATORY_TRACT
  Filled 2014-09-17 (×3): qty 3

## 2014-09-17 MED ORDER — ALBUTEROL SULFATE (2.5 MG/3ML) 0.083% IN NEBU: 2.5000 mg | INHALATION_SOLUTION | RESPIRATORY_TRACT | Status: DC | PRN

## 2014-09-17 MED ORDER — IPRATROPIUM-ALBUTEROL 0.5-2.5 (3) MG/3ML IN SOLN
3.0000 mL | Freq: Four times a day (QID) | RESPIRATORY_TRACT | Status: DC
  Administered 2014-09-17 (×2): 3 mL via RESPIRATORY_TRACT
  Filled 2014-09-17 (×2): qty 3

## 2014-09-17 MED ORDER — FUROSEMIDE 10 MG/ML IJ SOLN
40.0000 mg | Freq: Two times a day (BID) | INTRAMUSCULAR | Status: DC
  Administered 2014-09-17 – 2014-09-18 (×2): 40 mg via INTRAVENOUS
  Filled 2014-09-17 (×2): qty 4

## 2014-09-17 MED ORDER — VANCOMYCIN HCL IN DEXTROSE 750-5 MG/150ML-% IV SOLN
750.0000 mg | Freq: Three times a day (TID) | INTRAVENOUS | Status: DC
  Administered 2014-09-17 – 2014-09-18 (×5): 750 mg via INTRAVENOUS
  Filled 2014-09-17 (×6): qty 150

## 2014-09-17 MED ORDER — METHYLPREDNISOLONE SODIUM SUCC 125 MG IJ SOLR
60.0000 mg | Freq: Two times a day (BID) | INTRAMUSCULAR | Status: DC
  Administered 2014-09-17 – 2014-09-18 (×2): 60 mg via INTRAVENOUS
  Filled 2014-09-17 (×3): qty 0.96

## 2014-09-17 MED ORDER — DEXTROSE 5 % IV SOLN
1.0000 g | Freq: Three times a day (TID) | INTRAVENOUS | Status: DC
  Administered 2014-09-17 – 2014-09-26 (×28): 1 g via INTRAVENOUS
  Filled 2014-09-17 (×30): qty 1

## 2014-09-17 MED ORDER — AMIODARONE HCL 200 MG PO TABS
400.0000 mg | ORAL_TABLET | Freq: Two times a day (BID) | ORAL | Status: DC
  Administered 2014-09-17 – 2014-09-20 (×8): 400 mg via ORAL
  Filled 2014-09-17 (×10): qty 2

## 2014-09-17 NOTE — Progress Notes (Signed)
Chest percussion vest not used at this time. Patient complains that vest causes "worsened pain in the left side" and she states "it does not work". She is experiencing some pain at this time, and therefore does not wish to continue with the vest.

## 2014-09-17 NOTE — Progress Notes (Signed)
[facility] Progress Note   [name] CBJ:[id number] DOB: April 08, 1966 DOA: 09/10/2014 PCP: No primary care provider on file.  Brief narrative: [name] is a 49 y.o. female with Lung cancer who was treated for it in [place]. She was told recently after a PET scan that she had a mass in her lung and needed further treatment but she decided to move here this past Sunday to liver with her brother to help care for her. Her records were sent to Dr Julien Nordmann. She presented to the hospital on Tues with ongoing severe left chest pain and was admitted for pain control.   Subjective: Patient had an episode of respiratory distress yesterday evening, will repeat x-ray showing worsening left lung opacity, with volume overload,  Assessment/Plan:    Cancer associated pain - she has been on narcotics for months for pain starting at her left lower rib cage and radiating upwards towards axilla- the pain was severe even prior to coming to [facility] per brother and patient - has a lesion in left post 9th rib which is the only imaging finding that could be casing her pain - much better controlled- Fentanyl 100 mcg (was on 75 as outp)- MS Contin 30 BID  MSIR 15 Q 4 PRN are new which I started in house   Hematemesis - no prior history of this - vomited blood three times on 3/25- transferred to SDU-  - started BID Prontonix, stoppped Heparin for DVT prophylaxis - was also given a dose of Toradol on 3/25 which could have contributed - 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   acute hypoxic resp failure -  In the setting of lung cancer, and COPD at baseline, apears to be worsened secondary to HCAP , and volume overload. -  Currently on Ventimask 50% - Chest x-ray showing worsening left lung opacity, volume overload, improved with IV Lasix. - We'll start on IV steroids given her known history of COPD, nebulizer treatment.  HCAP  ( aspiration pneumonia) - may have aspirated on vomitus- started HCAP coverage (used Vanc/ Levaquin and Flagyl) on 3/27 as symptoms started on her 5th day in the hospital - obtained CXR which showed diffuse infiltrates which were likely from aspiration pneumonitis - stopped Vanc on 3/28 - Patient had deterioration of respiratory status overnight, added Vanco and cefepime to levofloxacin.    Lung cancer, primary, 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 - CT report- "bilobed 1.6 cm pleural based nodule in the left upper lobe concerning for malignancy. Probable necrotic lymph node in the AP window. Lucent lesion in left posterior ninth rib concerning for bony metastasis": --  Appt made for Dr Julien Nordmann for 4/5-     Anorexia-  - started Ensure and Megace-   Ongoing smoking - Nicotine patch    Code Status: full code Family Communication: None at bedside Disposition Plan: Remains on telemetry DVT prophylaxis: SCDs Consultants: GI Procedures:  1. EGD: very severe esophagitis possibly due to previous radiation combined with reflux  2. ECHO:  Left ventricle: LVEF is grossly normal at approximately 60% Diffiuclt to fully  evaluate regional wall motion as endocardium is difficult to see well. The cavity size was normal. Wall thickness was normal. - Pulmonary arteries: PA peak pressure: 34 mm Hg (S).  Antibiotics: Anti-infectives    Start     Dose/Rate Route Frequency Ordered Stop   09/17/14 0400  vancomycin (VANCOCIN) IVPB 750 mg/150 ml premix     750 mg 150 mL/hr over 60 Minutes Intravenous Every 8 hours 09/17/14 0159     09/17/14 0300  ceFEPIme (MAXIPIME) 1 g in dextrose 5 % 50 mL IVPB     1 g 100 mL/hr over 30 Minutes Intravenous 3 times per day 09/17/14 0154     09/15/14 1000  levofloxacin (LEVAQUIN) tablet 750 mg     750 mg Oral Daily 09/15/14 0751     09/15/14 0800  metroNIDAZOLE (FLAGYL) tablet 500 mg     500 mg Oral 3 times per day 09/15/14 0751     09/14/14 1000  metroNIDAZOLE (FLAGYL) IVPB 500 mg  Status:  Discontinued     500 mg 100 mL/hr over 60 Minutes Intravenous Every 8 hours 09/14/14 0845 09/15/14 0751   09/14/14 1000  levofloxacin (LEVAQUIN) IVPB 750 mg  Status:  Discontinued     750 mg 100 mL/hr over 90 Minutes Intravenous Every 24 hours 09/14/14 0847 09/15/14 0751   09/14/14 0900  vancomycin (VANCOCIN) 500 mg in sodium chloride 0.9 % 100 mL IVPB  Status:  Discontinued     500 mg 100 mL/hr over 60 Minutes Intravenous Every 8 hours 09/14/14 0850 09/15/14 1301      Objective: Filed Weights   09/15/14 1215 09/16/14 0559 09/17/14 0507  Weight: 63.2 kg (139 lb 5.3 oz) 62.5 kg (137 lb 12.6 oz) 63.7 kg (140 lb 6.9 oz)    Intake/Output Summary (Last 24 hours) at 09/17/14 1346 Last data filed at 09/17/14 1146  Gross per 24 hour  Intake 2973.3 ml  Output   2575 ml  Net  398.3 ml     Vitals Filed Vitals:   09/16/14 2030 09/17/14 0100 09/17/14 0507 09/17/14 0735  BP: 108/75 110/72 119/76   Pulse: 79 83 81   Temp: 98.5 F (36.9 C)  98.5 F (36.9 C)   TempSrc: Oral  Oral   Resp: 18 20 18    Height:      Weight:   63.7 kg (140 lb 6.9 oz)   SpO2: 92% 95% 91% 92%     Exam:  General:  Pt is alert, calm, in mild respiratory distress  HEENT: No icterus, No thrush  Cardiovascular: regular rate and rhythm, S1/S2 No murmur  Respiratory: Good air entry bilaterally, left lung Rales  Abdomen: Soft, +Bowel sounds, non tender, non distended, no guarding  MSK: No LE edema, cyanosis or clubbing  Data Reviewed: Basic Metabolic Panel:  Recent Labs Lab 09/11/14 0335 09/13/14 0400 09/13/14 1712 09/16/14 0100 09/16/14 1800  NA 137 137 136 131* 133*  K 3.6 4.1 3.9 2.9* 3.5  CL 103 100 96 93* 99  CO2 24 28 27  31  28  GLUCOSE 120* 124* 109* 93 106*  BUN 6 7 8 8  5*  CREATININE 0.60 0.43* 0.46* 0.43* 0.33*  CALCIUM 9.0 8.6 9.1 7.9* 7.6*  MG  --   --  1.7 1.4*  --    Liver Function Tests:  Recent Labs Lab 09/10/14 1915 09/11/14 0335  AST 18 12  ALT 8 8  ALKPHOS 95 86  BILITOT 0.6 0.9  PROT 7.5 6.7  ALBUMIN 3.8 3.4*    Recent Labs Lab 09/10/14 1915  LIPASE 13   No results for input(s): AMMONIA in the last 168 hours. CBC:  Recent Labs Lab 09/11/14 0335 09/12/14 1720 09/13/14 0400 09/14/14 0330 09/16/14 0100  WBC 15.2* 11.0* 6.8 6.1 8.0  HGB 12.7 12.4 11.7* 10.5* 10.9*  HCT 38.9 38.7 36.7 32.3* 33.9*  MCV 86.8 87.6 87.4 87.3 86.3  PLT 442* 405* 349 278 302   Cardiac Enzymes:  Recent Labs Lab 09/11/14 0950 09/11/14 1540 09/13/14 1712 09/13/14 2259 09/14/14 0330  TROPONINI <0.03 <0.03 <0.03 <0.03 <0.03   BNP (last 3 results) No results for input(s): BNP in the last 8760 hours.  ProBNP (last 3 results) No results for input(s): PROBNP in the last 8760 hours.  CBG: No results for input(s): GLUCAP in the last 168 hours.  Recent Results (from the past 240 hour(s))  MRSA PCR Screening     Status: None   Collection Time: 09/12/14 10:27 PM  Result Value Ref Range Status   MRSA by PCR NEGATIVE NEGATIVE Final    Comment:        The GeneXpert MRSA Assay (FDA approved for NASAL specimens only), is one component of  a comprehensive MRSA colonization surveillance program. It is not intended to diagnose MRSA infection nor to guide or monitor treatment for MRSA infections.      Studies:  Recent x-ray studies have been reviewed in detail by the Attending Physician  Scheduled Meds:  Scheduled Meds: . amiodarone  400 mg Oral BID  . antiseptic oral rinse  7 mL Mouth Rinse BID  . ceFEPime (MAXIPIME) IV  1 g Intravenous 3 times per day  . dextromethorphan-guaiFENesin  1 tablet Oral BID  . feeding supplement (ENSURE ENLIVE)  237 mL Oral TID BM  . feeding supplement (RESOURCE BREEZE)  1 Container Oral TID BM  . fentaNYL  100 mcg Transdermal Q72H  . furosemide  40 mg Intravenous BID  . ipratropium-albuterol  3 mL Nebulization Q6H  . levofloxacin  750 mg Oral Daily  . megestrol  400 mg Oral BID  . methylPREDNISolone (SOLU-MEDROL) injection  60 mg Intravenous Q12H  . metoprolol succinate  25 mg Oral Daily  . metroNIDAZOLE  500 mg Oral 3 times per day  . morphine  30 mg Oral Q12H  . nicotine  14 mg Transdermal Daily  . ondansetron  4 mg Oral Q6H   Or  . ondansetron (ZOFRAN) IV  4 mg Intravenous Q6H  . pantoprazole (PROTONIX) IV  40 mg Intravenous Q12H  . polyethylene glycol  17 g Oral Daily  . senna-docusate  1 tablet Oral QHS  . sodium chloride  3 mL Intravenous Q12H  . vancomycin  750 mg Intravenous Q8H   Continuous Infusions:    Time spent on care of this patient: 35 min   Breslin Burklow, MD [phone] 09/17/2014, 1:46 PM  LOS: 6 days   [facility] Office  [phone] Pager - Text Page per [email or web address]  If 7PM-7AM, please contact night-coverage [email or web address]

## 2014-09-17 NOTE — Progress Notes (Signed)
Case manager with [employer],  pt's insurance with [place] is out of [place], called for an update. Update faxed to [phone] to [place], RN, CM.

## 2014-09-17 NOTE — Progress Notes (Signed)
PT Cancellation Note  Patient Details Name: [name] MRN: [id number] DOB: [birth year]   Cancelled Treatment:    Reason Eval/Treat Not Completed: Medical issues which prohibited therapy;Other (comment) (placed on ventimask last night, not ready for PT at this time per RN)   York Ram E 09/17/2014, 1:27 PM Carmelia Bake, PT, DPT 09/17/2014 Pager: [phone]

## 2014-09-17 NOTE — Progress Notes (Signed)
ANTIBIOTIC CONSULT NOTE - Initial  Pharmacy Consult for Cefepime and Vancomycin Indication: HCAP  Allergies  Allergen Reactions  . Penicillins Rash    Patient Measurements: Height: 5\' 7"  (170.2 cm) Weight: 137 lb 12.6 oz (62.5 kg) IBW/kg (Calculated) : 61.6   Vital Signs: Temp: 98.5 F (36.9 C) (03/29 2030) Temp Source: Oral (03/29 2030) BP: 110/72 mmHg (03/30 0100) Pulse Rate: 83 (03/30 0100) Intake/Output from previous day: 03/29 0701 - 03/30 0700 In: 2218.3 [P.O.:520; I.V.:1698.3] Out: 1375 [Urine:1375] Intake/Output from this shift: Total I/O In: -  Out: 625 [Urine:625]  Labs:  Recent Labs  09/14/14 0330 09/16/14 0100 09/16/14 1800  WBC 6.1 8.0  --   HGB 10.5* 10.9*  --   PLT 278 302  --   CREATININE  --  0.43* 0.33*   Estimated Creatinine Clearance: 84.5 mL/min (by C-G formula based on Cr of 0.33). No results for input(s): VANCOTROUGH, VANCOPEAK, VANCORANDOM, GENTTROUGH, GENTPEAK, GENTRANDOM, TOBRATROUGH, TOBRAPEAK, TOBRARND, AMIKACINPEAK, AMIKACINTROU, AMIKACIN in the last 72 hours.   Microbiology: Recent Results (from the past 720 hour(s))  MRSA PCR Screening     Status: None   Collection Time: 09/12/14 10:27 PM  Result Value Ref Range Status   MRSA by PCR NEGATIVE NEGATIVE Final    Comment:        The GeneXpert MRSA Assay (FDA approved for NASAL specimens only), is one component of a comprehensive MRSA colonization surveillance program. It is not intended to diagnose MRSA infection nor to guide or monitor treatment for MRSA infections.     Medical History: Past Medical History  Diagnosis Date  . Cancer     left bronchioles    Assessment: 49 yo female with lung cancer previously treated in [place] but now with a mass in her lung and requiring further treatment. Pt with fever and increased cough and overnight may have aspirated on vomitus. Pt on  Levaquin and Flagyl (MD didn't want Clindamycin or Primaxin for anaerobic coverage). Now with  increased SOB, CXR showing new multifocal PNA and mild pulmonary edema.  Cefepime and Vancomycin per Rx added.   3/30 Vanco >> >> 3/27 Levaquin >> >> 3/27 Flagyl >> >> 3/30 Cefepime >>  Tmax: 100.3 WBCs: 6.1 Renal: SCr low, CrCl>100 ml/min  Goal of Therapy:  Vancomycin trough level 15-20 mcg/ml  Doses adjusted per renal function Eradication of infection  Plan:  1.  Cefepime 1Gm IV q8h 2.  Vancomycin 750mg  IV q8h  Dorrene German 09/17/2014 2:01 AM

## 2014-09-17 NOTE — Progress Notes (Signed)
Pt with 50% venti mask. Desats to 84%. Respiratory (Tabatha) in to assess pt increased venti mask to 55%. Patient with shallow respirations. Recommendations to transfer pt to step down ICU. Provider notified by charge nurse Sophia. New orders received to transfer pt to step down ICU. Pt informed of transfer and declines to have family notified of transfer at this time states she will notify them in morning. Pt will be transferred to bed 1235.

## 2014-09-17 NOTE — Progress Notes (Signed)
    Subjective:  Denies CP; complains of dyspnea.   Objective:  Filed Vitals:   09/16/14 2030 09/17/14 0100 09/17/14 0507 09/17/14 0735  BP: 108/75 110/72 119/76   Pulse: 79 83 81   Temp: 98.5 F (36.9 C)  98.5 F (36.9 C)   TempSrc: Oral  Oral   Resp: 18 20 18    Height:      Weight:   140 lb 6.9 oz (63.7 kg)   SpO2: 92% 95% 91% 92%    Intake/Output from previous day:  Intake/Output Summary (Last 24 hours) at 09/17/14 0805 Last data filed at 09/17/14 0238  Gross per 24 hour  Intake 2843.3 ml  Output   1375 ml  Net 1468.3 ml    Physical Exam: Physical exam: Well-developed chronically ill appearing in no acute distress.  Skin is warm and dry.  HEENT is normal.  Neck is supple.  Chest diminished BS throughout (>LLL) Cardiovascular exam is regular rate and rhythm.  Abdominal exam nontender or distended. No masses palpated. Extremities show no edema. neuro grossly intact    Lab Results: Basic Metabolic Panel:  Recent Labs  09/16/14 0100 09/16/14 1800  NA 131* 133*  K 2.9* 3.5  CL 93* 99  CO2 31 28  GLUCOSE 93 106*  BUN 8 5*  CREATININE 0.43* 0.33*  CALCIUM 7.9* 7.6*  MG 1.4*  --    CBC:  Recent Labs  09/16/14 0100  WBC 8.0  HGB 10.9*  HCT 33.9*  MCV 86.3  PLT 302     Assessment/Plan:  1. Atrial fibrillation with RVR. Probably related to stress of underlying pulmonary problems. [place] Vasc score of 1. Not a candidate for anticoagulation due to recent GI bleed. Change amiodarone to 400 mg po BID; decrease to 200 mg daily after 2 weeks; continue toprol. 2. Metastatic Lung CA-more dyspneic this AM; chest xray with pneumonia and edema; DC IVFs; one dose lasix; agree with antibiotics; further management per IM. 3. 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 Severe hypokalemia-improved We will follow from a distance; patient should FU with Dr [place] for cardiac issues following [place] 09/17/2014, 8:05 AM

## 2014-09-17 NOTE — Progress Notes (Signed)
Shift event: RN paged this NP secondary to pt feeling more SOB. O2 sat 80s and O2 increased to 6L with O2 sat up to 89%. RT came in and gave pt a neb and placed her on a venti mask at 50% and O2 sat up to 95%. CXR showed new multifocal PNA and mild pulmonary edema. IVF decreased to 75cc/hr and Cefepime and Vancomycin added to the Levaquin pt is on already. Will follow closely. Low threshold to transfer to SDU if declines. Clance Boll, NP [facility]

## 2014-09-18 ENCOUNTER — Encounter (HOSPITAL_COMMUNITY): Payer: Self-pay | Admitting: Radiology

## 2014-09-18 ENCOUNTER — Inpatient Hospital Stay (HOSPITAL_COMMUNITY): Payer: 59

## 2014-09-18 ENCOUNTER — Ambulatory Visit (HOSPITAL_COMMUNITY): Payer: 59

## 2014-09-18 DIAGNOSIS — J9601 Acute respiratory failure with hypoxia: Secondary | ICD-10-CM

## 2014-09-18 LAB — CBC WITH DIFFERENTIAL/PLATELET
BASOS ABS: 0 10*3/uL (ref 0.0–0.1)
Basophils Relative: 0 % (ref 0–1)
Eosinophils Absolute: 0 10*3/uL (ref 0.0–0.7)
Eosinophils Relative: 0 % (ref 0–5)
HEMATOCRIT: 30.3 % — AB (ref 36.0–46.0)
Hemoglobin: 10.3 g/dL — ABNORMAL LOW (ref 12.0–15.0)
LYMPHS ABS: 0.3 10*3/uL — AB (ref 0.7–4.0)
LYMPHS PCT: 4 % — AB (ref 12–46)
MCH: 28.7 pg (ref 26.0–34.0)
MCHC: 34 g/dL (ref 30.0–36.0)
MCV: 84.4 fL (ref 78.0–100.0)
Monocytes Absolute: 0.3 10*3/uL (ref 0.1–1.0)
Monocytes Relative: 4 % (ref 3–12)
Neutro Abs: 7.3 10*3/uL (ref 1.7–7.7)
Neutrophils Relative %: 92 % — ABNORMAL HIGH (ref 43–77)
Platelets: 249 10*3/uL (ref 150–400)
RBC: 3.59 MIL/uL — ABNORMAL LOW (ref 3.87–5.11)
RDW: 15.7 % — AB (ref 11.5–15.5)
WBC: 7.9 10*3/uL (ref 4.0–10.5)

## 2014-09-18 LAB — BASIC METABOLIC PANEL
Anion gap: 8 (ref 5–15)
BUN: 6 mg/dL (ref 6–23)
CALCIUM: 7.8 mg/dL — AB (ref 8.4–10.5)
CO2: 31 mmol/L (ref 19–32)
CREATININE: 0.34 mg/dL — AB (ref 0.50–1.10)
Chloride: 92 mmol/L — ABNORMAL LOW (ref 96–112)
GFR calc Af Amer: >90 mL/min (ref >90)
GFR calc non Af Amer: >90 mL/min (ref >90)
Glucose, Bld: 131 mg/dL — ABNORMAL HIGH (ref 70–99)
Potassium: 3.2 mmol/L — ABNORMAL LOW (ref 3.5–5.1)
SODIUM: 131 mmol/L — AB (ref 135–145)

## 2014-09-18 LAB — VANCOMYCIN, TROUGH: Vancomycin Tr: 11.2 ug/mL (ref 10.0–20.0)

## 2014-09-18 LAB — LACTIC ACID, PLASMA: LACTIC ACID, VENOUS: 1.2 mmol/L (ref 0.5–2.0)

## 2014-09-18 LAB — MRSA PCR SCREENING: MRSA BY PCR: NEGATIVE

## 2014-09-18 MED ORDER — METRONIDAZOLE IN NACL 5-0.79 MG/ML-% IV SOLN
500.0000 mg | Freq: Three times a day (TID) | INTRAVENOUS | Status: DC
  Administered 2014-09-18 – 2014-09-26 (×24): 500 mg via INTRAVENOUS
  Filled 2014-09-18 (×25): qty 100

## 2014-09-18 MED ORDER — IOHEXOL 350 MG/ML SOLN
80.0000 mL | Freq: Once | INTRAVENOUS | Status: AC | PRN
  Administered 2014-09-18: 80 mL via INTRAVENOUS

## 2014-09-18 MED ORDER — IPRATROPIUM BROMIDE 0.02 % IN SOLN
0.5000 mg | Freq: Four times a day (QID) | RESPIRATORY_TRACT | Status: DC
  Administered 2014-09-18 – 2014-09-26 (×33): 0.5 mg via RESPIRATORY_TRACT
  Filled 2014-09-18 (×35): qty 2.5

## 2014-09-18 MED ORDER — VANCOMYCIN HCL IN DEXTROSE 1-5 GM/200ML-% IV SOLN
1000.0000 mg | Freq: Three times a day (TID) | INTRAVENOUS | Status: DC
  Administered 2014-09-18 – 2014-09-26 (×22): 1000 mg via INTRAVENOUS
  Filled 2014-09-18 (×24): qty 200

## 2014-09-18 MED ORDER — POTASSIUM CHLORIDE 10 MEQ/100ML IV SOLN
10.0000 meq | INTRAVENOUS | Status: AC
  Administered 2014-09-18 (×4): 10 meq via INTRAVENOUS
  Filled 2014-09-18 (×4): qty 100

## 2014-09-18 MED ORDER — LEVALBUTEROL HCL 0.63 MG/3ML IN NEBU
0.6300 mg | INHALATION_SOLUTION | Freq: Four times a day (QID) | RESPIRATORY_TRACT | Status: DC
  Administered 2014-09-18 – 2014-09-26 (×32): 0.63 mg via RESPIRATORY_TRACT
  Filled 2014-09-18 (×34): qty 3

## 2014-09-18 MED ORDER — SODIUM CHLORIDE 0.9 % IV BOLUS (SEPSIS)
500.0000 mL | Freq: Once | INTRAVENOUS | Status: AC
  Administered 2014-09-18: 500 mL via INTRAVENOUS

## 2014-09-18 MED ORDER — METHYLPREDNISOLONE SODIUM SUCC 40 MG IJ SOLR
40.0000 mg | Freq: Two times a day (BID) | INTRAMUSCULAR | Status: DC
  Administered 2014-09-18 – 2014-09-20 (×4): 40 mg via INTRAVENOUS
  Filled 2014-09-18 (×4): qty 1

## 2014-09-18 NOTE — Progress Notes (Signed)
Pt hypotensive, BP 87/44 via monitor and manual BP was 82/50. [facility] hospitalist, K. Schorr was notified. Pt was administered a 500 cc normal saline bolus. Will continue to monitor.

## 2014-09-18 NOTE — Consult Note (Signed)
PULMONARY / CRITICAL CARE MEDICINE   Name: [name] MRN: [id number] DOB: 03-05-1966    ADMISSION DATE:  09/10/2014 CONSULTATION DATE:  3/31  REFERRING MD :  [facility]  CHIEF COMPLAINT:  Hypoxia  INITIAL PRESENTATION: Chest pain  STUDIES:    SIGNIFICANT EVENTS: 3/30 desaturation   HISTORY OF PRESENT ILLNESS:   49 yo smoker, dx with  Left Lung cancer(july 2015) underwent chemo and Rtx till 2/16 at which time she moved to [place] from [place] for family support.  She is to establish with local Cancer MD but presents to [facility]  3/23 with chest pain and acute dyspnea. She further developed vomiting along with hematemesis and was evaluated by GI services. Seen on 3/26 by Cardiology service for new onset Afib and placed on IV amiodarone. 3/30 she developed increasing sob , O2 desaturation, and was moved to SDU and PCCM asked to evaluate. She is in venti mask at 50%, desaturates with any activity. CT of chest with left lower fibrothorax and metastases. Alert and interactive, she is a full code. PCCm will evaluate.   PAST MEDICAL HISTORY :   has a past medical history of Cancer.  has past surgical history that includes Esophagogastroduodenoscopy (N/A, 09/14/2014). Prior to Admission medications   Medication Sig Start Date End Date Taking? Authorizing Provider  fentaNYL (DURAGESIC - DOSED MCG/HR) 75 MCG/HR Place 75 mcg onto the skin every 3 (three) days. 09/04/14  Yes Historical Provider, MD  oxycodone (ROXICODONE) 30 MG immediate release tablet Take 1 tablet by mouth every 8 (eight) hours as needed for pain.  08/30/14  Yes Historical Provider, MD   Allergies  Allergen Reactions  . Penicillins Rash    FAMILY HISTORY:  has no family status information on file.  SOCIAL HISTORY:  reports that she has quit smoking. She does not have any smokeless tobacco history on file. She reports that she does not drink alcohol or use illicit drugs.  REVIEW OF SYSTEMS:  10 point review of  system taken, please see HPI for positives and negatives.   SUBJECTIVE:   VITAL SIGNS: Temp:  [97.5 F (36.4 C)-98.5 F (36.9 C)] 97.5 F (36.4 C) (03/31 0900) Pulse Rate:  [59-150] 79 (03/31 0700) Resp:  [14-21] 18 (03/31 0700) BP: (82-117)/(44-84) 100/64 mmHg (03/31 0700) SpO2:  [87 %-94 %] 92 % (03/31 0910) FiO2 (%):  [50 %-98 %] 98 % (03/31 0910) Weight:  [140 lb 10.5 oz (63.8 kg)] 140 lb 10.5 oz (63.8 kg) (03/31 0400) HEMODYNAMICS:   VENTILATOR SETTINGS: Vent Mode:  [-]  FiO2 (%):  [50 %-98 %] 98 % INTAKE / OUTPUT:  Intake/Output Summary (Last 24 hours) at 09/18/14 0918 Last data filed at 09/18/14 0844  Gross per 24 hour  Intake    213 ml  Output   5650 ml  Net  -5437 ml    PHYSICAL EXAMINATION: General: thin female Neuro:  Intact HEENT:  No jvd/lan Cardiovascular:  HSR RRR Lungs:  Decreased air movement Abdomen:  Dereased bs Musculoskeletal:  intact Skin:  warm  LABS:  CBC  Recent Labs Lab 09/14/14 0330 09/16/14 0100 09/18/14 0300  WBC 6.1 8.0 7.9  HGB 10.5* 10.9* 10.3*  HCT 32.3* 33.9* 30.3*  PLT 278 302 249   Coag's No results for input(s): APTT, INR in the last 168 hours. BMET  Recent Labs Lab 09/16/14 0100 09/16/14 1800 09/18/14 0300  NA 131* 133* 131*  K 2.9* 3.5 3.2*  CL 93* 99 92*  CO2 31 28  31  BUN 8 5* 6  CREATININE 0.43* 0.33* 0.34*  GLUCOSE 93 106* 131*   Electrolytes  Recent Labs Lab 09/13/14 1712 09/16/14 0100 09/16/14 1800 09/18/14 0300  CALCIUM 9.1 7.9* 7.6* 7.8*  MG 1.7 1.4*  --   --    Sepsis Markers  Recent Labs Lab 09/18/14 0300  LATICACIDVEN 1.2   ABG No results for input(s): PHART, PCO2ART, PO2ART in the last 168 hours. Liver Enzymes No results for input(s): AST, ALT, ALKPHOS, BILITOT, ALBUMIN in the last 168 hours. Cardiac Enzymes  Recent Labs Lab 09/13/14 1712 09/13/14 2259 09/14/14 0330  TROPONINI <0.03 <0.03 <0.03   Glucose No results for input(s): GLUCAP in the last 168  hours.  Imaging Dg Chest Port 1 View  09/18/2014   CLINICAL DATA:  Respiratory distress. Hypoxia. History of lung cancer.  EXAM: PORTABLE CHEST - 1 VIEW  COMPARISON:  Most recent radiograph earlier this day at 0122 hr, Chest CT 323 16  FINDINGS: Volume loss in the left hemithorax, however mildly improved aeration from prior exam. Left basilar consolidation and pleural effusion, mildly improved. Slight decrease in cardiomegaly. Interstitial edema, similar to prior. Multifocal parenchymal opacities in the periphery of the right lung, improved in the mid lung zone. Right chest port remains in place. Unchanged blunting of the right costophrenic angle. There is no pneumothorax.  IMPRESSION: 1. Improved radiographic appearance of the chest with slight improvement in left basilar and right mid lung zone aeration. 2. Pulmonary edema and multifocal opacities in the periphery of the right lung, unchanged.   Electronically Signed   By: Jeb Levering M.D.   On: 09/18/2014 00:14   Dg Chest Port 1 View  09/17/2014   CLINICAL DATA:  Sudden onset of shortness of breath. Initial encounter.  EXAM: PORTABLE CHEST - 1 VIEW  COMPARISON:  Chest radiograph performed 09/14/2014  FINDINGS: Worsening left basilar airspace opacification is compatible with pneumonia. Underlying interstitial prominence is again noted, likely reflecting mild pulmonary edema. Mild focal peripheral right mid and lower lung zone airspace opacities are also seen, concerning for pneumonia. Small bilateral pleural effusions are again seen. There is chronic elevation of the left hemidiaphragm. No pneumothorax is identified.  The cardiomediastinal silhouette is enlarged. A right-sided chest port is noted ending overlying the mid SVC. No acute osseous abnormalities are identified.  IMPRESSION: 1. Worsening left basilar airspace opacification, and mild focal right peripheral mid and lower lung zone airspace opacities, compatible with multifocal pneumonia, new  from the prior study. 2. Underlying interstitial prominence likely reflects mild pulmonary edema. Small bilateral pleural effusions and cardiomegaly again noted.   Electronically Signed   By: Garald Balding M.D.   On: 09/17/2014 01:36    Intake/Output Summary (Last 24 hours) at 09/18/14 6789 Last data filed at 09/18/14 0844  Gross per 24 hour  Intake    213 ml  Output   4900 ml  Net  -4687 ml    ASSESSMENT / PLAN:  PULMONARY OETT A: Lung cancer unknown type Hypoxia ?from cancer spread vs aspiration pna , Afib RVR or multifactorial  P:   O2 as needed May need intubation Diuresis(negative 5 litres last 24 hours BD's With recent aspiration would not use bipap Consider dc amio Cannot ro Pe , not on anticoagulation due to GIB, cancer pt with sudden onset hypoxia despite being 5 litre negative. Consider repeat CT chest but can we anticoagulate if she hsa a PE?  CARDIOVASCULAR CVL potra cath>> A:  A fib RVR  P:  Cards is following No anticoagulation due to GI bleed. Change albuterol to xopenex   RENAL Lab Results  Component Value Date   CREATININE 0.34* 09/18/2014   CREATININE 0.33* 09/16/2014   CREATININE 0.43* 09/16/2014    Recent Labs Lab 09/16/14 0100 09/16/14 1800 09/18/14 0300  K 2.9* 3.5 3.2*     A:   Hypokalemia P:   Replete K+  GASTROINTESTINAL A:   GIB P:   Per GI PPI  HEMATOLOGIC  Recent Labs  09/16/14 0100 09/18/14 0300  HGB 10.9* 10.3*    A:   Lung cancer P:  Needs hemonc consult  INFECTIOUS A:   Suspected aspiration pna in immunocompromised pt   P:   BCx2  UC Sputum Abx 3/30 maxipime>> 3/28 levaquin>> 3/28 flagyl>> 3/30 vamco>>  ENDOCRINE A:   SIDM  P:   SSI  NEUROLOGIC A:   No acute issue P:   RASS goal:0 Alert    FAMILY  - Updates:   - Inter-disciplinary family meet or Palliative Care meeting due by:  day 7    TODAY'S SUMMARY:   49 yo smoker, dx with  Left Lung cancer(july 2015) underwent  chemo and Rts-x till 2/16 at which time she moved to [place] from [place] for family support.  She is to establish with local Cancer MD but presents to [facility]  3/23 with chest pain and acute dyspnea. She further developed vomiting along with hematemesis and was evaluated by GI services. Seen on 3/29 by Cardiology service for new onset Afib and placed on IV amiodarone. 3/30 she developed increasing sob , O2 desaturation, and was moved to SDU and PCCM asked to evaluate. She is in venti mask at 50%, desaturates with any activity. CT of chest with left lower fibrothorax and metastases. Alert and interactive, she is a full code. PCCm will evaluate.     Richardson Landry Minor ACNP Maryanna Shape PCCM Pager [phone] till 3 pm If no answer page [phone] 09/18/2014, 9:18 AM

## 2014-09-18 NOTE — Progress Notes (Signed)
Pharmacy Consult for Vancomycin Indication: pneumonia  See previous note from Hershal Coria, PharmD for full details.  Vancomycin trough = 11.2 mcg/ml before 6th dose of 750mg  IV q8h  Plan:  Increase vancomycin to 1g IV q8h  Recheck trough at steady state if treatment continues  Peggyann Juba, PharmD, BCPS Pager: [phone] 09/18/2014 7:16 PM

## 2014-09-18 NOTE — Progress Notes (Signed)
    Subjective:  Denies CP; complains of difficulty urinating and mild dyspnea   Objective:  Filed Vitals:   09/18/14 0400 09/18/14 0500 09/18/14 0600 09/18/14 0700  BP: 117/84 95/66 117/69 100/64  Pulse: 87 81 80 79  Temp: 98.4 F (36.9 C)     TempSrc: Axillary     Resp: 21 14 19 18   Height:      Weight: 140 lb 10.5 oz (63.8 kg)     SpO2: 87% 93% 94% 94%    Intake/Output from previous day:  Intake/Output Summary (Last 24 hours) at 09/18/14 0824 Last data filed at 09/17/14 2340  Gross per 24 hour  Intake    453 ml  Output   5250 ml  Net  -4797 ml    Physical Exam: Physical exam: Well-developed chronically ill appearing in no acute distress.  Skin is warm and dry.  HEENT is normal.  Neck is supple.  Chest diminished BS throughout (>LLL) Cardiovascular exam is regular rate and rhythm.  Abdominal exam nontender or distended. No masses palpated. Extremities show no edema. neuro grossly intact    Lab Results: Basic Metabolic Panel:  Recent Labs  09/16/14 0100 09/16/14 1800 09/18/14 0300  NA 131* 133* 131*  K 2.9* 3.5 3.2*  CL 93* 99 92*  CO2 31 28 31   GLUCOSE 93 106* 131*  BUN 8 5* 6  CREATININE 0.43* 0.33* 0.34*  CALCIUM 7.9* 7.6* 7.8*  MG 1.4*  --   --    CBC:  Recent Labs  09/16/14 0100 09/18/14 0300  WBC 8.0 7.9  NEUTROABS  --  7.3  HGB 10.9* 10.3*  HCT 33.9* 30.3*  MCV 86.3 84.4  PLT 302 249     Assessment/Plan:  1. Atrial fibrillation with RVR. Probably related to stress of underlying pulmonary problems. [place] Vasc score of 1. Not a candidate for anticoagulation due to recent GI bleed. Continue amiodarone 400 mg po BID; decrease to 200 mg daily after 2 weeks; discontinue toprol as BP borderline. 2. Metastatic Lung CA-remains dyspneic/hypoxic this AM; patient being treated with antibiotics for pneumonia and diuresis.  3. 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 Severe hypokalemia-supplement We will follow from a distance; patient  should FU with Dr Martinique for cardiac issues following [place] 09/18/2014, 8:24 AM

## 2014-09-18 NOTE — Progress Notes (Signed)
CARE MANAGEMENT NOTE 09/18/2014  Patient:  [name], [name]   Account Number:  [id number]  Date Initiated:  09/15/2014  Documentation initiated by:  Rickayla Wieland  Subjective/Objective Assessment:   gi bld with chest pain -tropnoin neg/ hgb stable edg severe corrorisive esophagitis with necrosis Lynwood Dawley     Action/Plan:   home when stable   Anticipated DC Date:  09/21/2014   Anticipated DC Plan:  HOME/SELF CARE  In-house referral  NA      DC Planning Services  CM consult      PAC Choice  NA   Choice offered to / List presented to:  NA      DME agency  NA        [place] agency  NA   Status of service:  In process, will continue to follow Medicare Important Message given?   (If response is "NO", the following Medicare IM given date fields will be blank) Date Medicare IM given:   Medicare IM given by:   Date Additional Medicare IM given:   Additional Medicare IM given by:    Discharge Disposition:    Per UR Regulation:  Reviewed for med. necessity/level of care/duration of stay  If discussed at [place] of Stay Meetings, dates discussed:    Comments:  September 18, 2014/Temari Schooler L. Rosana Hoes, RN, [name], CCM. Case Management [place] [phone] No discharge needs present of time of review. Patient transferrd down to sdu due to hypoxia and requiring 50-100% o2 via aresol face mask.  High risk of intubation.  September 15, 2014/Gustave Lindeman L. Rosana Hoes, RN, [name], CCM. Case Management [place] [phone] No discharge needs present of time of review.

## 2014-09-18 NOTE — Progress Notes (Signed)
Pt currently off the ward, no signs of bleeding. Will fu on esophagitis tomorrow

## 2014-09-18 NOTE — Plan of Care (Signed)
Called earlier by RN stating pt was desatting into the 80s even with venti mask. No increased WOB. Pt stated she felt "same as always" in regard to her breathing. This NP spoke with RT at bedside who recommended percussive bed in SDU and more frequent nebs. Orders placed. Pt moved to SDU.  In SDU, she is satting 94% on aerosol at 80% FIO2. In no distress. On percussion bed. Also, may use vest as needed. Continue triple abx therapy and suppportive care for PNA. Will follow closely.  Clance Boll, NP [facility]

## 2014-09-18 NOTE — Progress Notes (Addendum)
[facility] Progress Note   [name] TFT:[id number] DOB: 04/18/1966 DOA: 09/10/2014 PCP: No primary care provider on file.  Brief narrative: [name] is a 49 y.o. female with Lung cancer who was treated for it in [place]. She was told recently after a PET scan that she had a mass in her lung and needed further treatment but she decided to move here this past Sunday to liver with her brother to help care for her. Her records were sent to Dr Julien Nordmann. She presented to the hospital on Tues with ongoing severe left chest pain and was admitted for pain control.   Subjective: Patient in mild respiratory distress, still on Ventimask, complaining of mild cough productive sputum .  Assessment/Plan:  acute hypoxic resp failure -  In the setting of lung cancer, and COPD at baseline, apears to be worsened secondary to HCAP , and volume overload. -  Currently on Ventimask 50% - Chest x-ray showing worsening left lung opacity, volume overload, improved with IV Lasix. - started on IV steroids given her known history of COPD, continue to taper down., nebulizer treatment. - Treated with IV Lasix 3/30 giving evidence of volume overload. - We will check CT  Chest angiogram to rule out PE given patient significant hypoxia - Pulmonary consult appreciated. - discussed with cardiology regarding amiodarone, we will needed for short-term.     Cancer associated pain - she has been on narcotics for months for pain starting at her left lower rib cage and radiating upwards towards axilla- the pain was severe even prior to coming to [facility] per brother and patient - has a lesion in left post 9th rib which is the only imaging finding that could be casing her pain - much better controlled    Hematemesis - no prior history of this - vomited blood three times on 3/25- transferred to SDU-  - started BID Prontonix, stoppped Heparin for DVT prophylaxis - was also given a dose of Toradol  on 3/25 which could have contributed - 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    HCAP  ( aspiration pneumonia) - may have aspirated on vomitus- started HCAP coverage (used Vanc/ Levaquin and Flagyl) on 3/27 as symptoms started on her 5th day in the hospital - obtained CXR which showed diffuse infiltrates which were likely from aspiration pneumonitis - stopped Vanc on 3/28 - Patient had deterioration of respiratory status 3/29, added Vanco and cefepime . Levofloxacin and Flagyl. 3/31.    Lung cancer, primary, 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 - CT report- "bilobed 1.6 cm pleural based nodule in the left upper lobe concerning for malignancy. Probable necrotic lymph node in the  AP window. Lucent lesion in left posterior ninth rib concerning for bony metastasis": -- Appt made for Dr Julien Nordmann for 4/5-     Anorexia-  - started Ensure and Megace-   Ongoing smoking - Nicotine patch    Code Status: full code, overall prognosis is poor Family Communication: None at bedside Disposition Plan: Remains on step down DVT prophylaxis: SCDs Consultants: GI Procedures:  1. EGD: very severe esophagitis possibly  due to previous radiation combined with reflux  2. ECHO:  Left ventricle: LVEF is grossly normal at approximately 60% Diffiuclt to fully evaluate regional wall motion as endocardium is difficult to see well. The cavity size was normal. Wall thickness was normal. - Pulmonary arteries: PA peak pressure: 34 mm Hg (S).  Antibiotics: Anti-infectives    Start     Dose/Rate Route Frequency Ordered Stop   09/17/14 0400  vancomycin (VANCOCIN) IVPB 750 mg/150 ml premix     750 mg 150 mL/hr over 60 Minutes Intravenous Every 8 hours 09/17/14 0159     09/17/14 0300  ceFEPIme (MAXIPIME) 1 g in dextrose 5 % 50 mL IVPB     1 g 100 mL/hr over 30 Minutes Intravenous 3 times per day 09/17/14 0154     09/15/14 1000  levofloxacin (LEVAQUIN) tablet 750 mg     750 mg Oral Daily 09/15/14 0751     09/15/14 0800  metroNIDAZOLE (FLAGYL) tablet 500 mg     500 mg Oral 3 times per day 09/15/14 0751     09/14/14 1000  metroNIDAZOLE (FLAGYL) IVPB 500 mg  Status:  Discontinued     500 mg 100 mL/hr over 60 Minutes Intravenous Every 8 hours 09/14/14 0845 09/15/14 0751   09/14/14 1000  levofloxacin (LEVAQUIN) IVPB 750 mg  Status:  Discontinued     750 mg 100 mL/hr over 90 Minutes Intravenous Every 24 hours 09/14/14 0847 09/15/14 0751   09/14/14 0900  vancomycin (VANCOCIN) 500 mg in sodium chloride 0.9 % 100 mL IVPB  Status:  Discontinued     500 mg 100 mL/hr over 60 Minutes Intravenous Every 8 hours 09/14/14 0850 09/15/14 1301      Objective: Filed Weights   09/16/14 0559 09/17/14 0507 09/18/14 0400  Weight: 62.5 kg (137 lb 12.6 oz) 63.7 kg (140 lb 6.9 oz) 63.8 kg (140 lb 10.5 oz)    Intake/Output Summary (Last 24 hours) at 09/18/14 1128 Last data filed at 09/18/14 0844  Gross per 24 hour  Intake    213 ml  Output   4100 ml  Net  -3887 ml     Vitals Filed Vitals:   09/18/14 0600 09/18/14 0700 09/18/14 0900 09/18/14 0910  BP: 117/69 100/64    Pulse: 80 79    Temp:   97.5 F (36.4 C)    TempSrc:   Oral   Resp: 19 18    Height:      Weight:      SpO2: 94% 94%  92%    Exam:  General:  Pt is alert, , in mild respiratory distress  HEENT: No icterus, No thrush  Cardiovascular: regular rate and rhythm, S1/S2 No murmur  Respiratory: Good air entry bilaterally, left lung Rales  Abdomen: Soft, +Bowel sounds, non tender, non distended, no guarding  MSK: No LE edema, cyanosis or clubbing  Data Reviewed: Basic Metabolic Panel:  Recent Labs Lab 09/13/14 0400 09/13/14 1712 09/16/14 0100 09/16/14 1800 09/18/14 0300  NA 137 136 131* 133* 131*  K 4.1 3.9 2.9* 3.5  3.2*  CL 100 96 93* 99 92*  CO2 28 27 31 28 31   GLUCOSE 124* 109* 93 106* 131*  BUN 7 8 8  5* 6  CREATININE 0.43* 0.46* 0.43* 0.33* 0.34*  CALCIUM 8.6 9.1 7.9* 7.6* 7.8*  MG  --  1.7 1.4*  --   --    Liver Function Tests: No results for input(s): AST, ALT, ALKPHOS, BILITOT, PROT, ALBUMIN in the last 168 hours. No results for input(s): LIPASE, AMYLASE in the last 168 hours. No results for input(s): AMMONIA in the last 168 hours. CBC:  Recent Labs Lab 09/12/14 1720 09/13/14 0400 09/14/14 0330 09/16/14 0100 09/18/14 0300  WBC 11.0* 6.8 6.1 8.0 7.9  NEUTROABS  --   --   --   --  7.3  HGB 12.4 11.7* 10.5* 10.9* 10.3*  HCT 38.7 36.7 32.3* 33.9* 30.3*  MCV 87.6 87.4 87.3 86.3 84.4  PLT 405* 349 278 302 249   Cardiac Enzymes:  Recent Labs Lab 09/11/14 1540 09/13/14 1712 09/13/14 2259 09/14/14 0330  TROPONINI <0.03 <0.03 <0.03 <0.03   BNP (last 3 results) No results for input(s): BNP in the last 8760 hours.  ProBNP (last 3 results) No results for input(s): PROBNP in the last 8760 hours.  CBG: No results for input(s): GLUCAP in the last 168 hours.  Recent Results (from the past 240 hour(s))  MRSA PCR Screening     Status: None   Collection Time: 09/12/14 10:27 PM  Result Value Ref Range Status   MRSA by PCR NEGATIVE NEGATIVE Final    Comment:        The GeneXpert MRSA Assay  (FDA approved for NASAL specimens only), is one component of a comprehensive MRSA colonization surveillance program. It is not intended to diagnose MRSA infection nor to guide or monitor treatment for MRSA infections.   MRSA PCR Screening     Status: None   Collection Time: 09/18/14  4:00 AM  Result Value Ref Range Status   MRSA by PCR NEGATIVE NEGATIVE Final    Comment:        The GeneXpert MRSA Assay (FDA approved for NASAL specimens only), is one component of a comprehensive MRSA colonization surveillance program. It is not intended to diagnose MRSA infection nor to guide or monitor treatment for MRSA infections.      Studies:  Recent x-ray studies have been reviewed in detail by the Attending Physician  Scheduled Meds:  Scheduled Meds: . amiodarone  400 mg Oral BID  . antiseptic oral rinse  7 mL Mouth Rinse BID  . ceFEPime (MAXIPIME) IV  1 g Intravenous 3 times per day  . dextromethorphan-guaiFENesin  1 tablet Oral BID  . feeding supplement (ENSURE ENLIVE)  237 mL Oral TID BM  . feeding supplement (RESOURCE BREEZE)  1 Container Oral TID BM  . fentaNYL  100 mcg Transdermal Q72H  . ipratropium  0.5 mg Nebulization Q6H  . levalbuterol  0.63 mg Nebulization 4 times per day  . levofloxacin  750 mg Oral Daily  . megestrol  400 mg Oral BID  . methylPREDNISolone (SOLU-MEDROL) injection  40 mg Intravenous Q12H  . metroNIDAZOLE  500 mg Oral 3 times per day  . morphine  30 mg Oral Q12H  . nicotine  14 mg Transdermal Daily  . ondansetron  4 mg Oral Q6H   Or  . ondansetron (ZOFRAN) IV  4 mg Intravenous Q6H  . pantoprazole (PROTONIX) IV  40 mg Intravenous Q12H  . polyethylene glycol  17 g Oral Daily  . senna-docusate  1 tablet Oral QHS  . sodium chloride  3 mL Intravenous Q12H  . vancomycin  750 mg Intravenous Q8H   Continuous Infusions:    Time spent on care of this patient: 40 min   Carnesha Maravilla, MD [phone] 09/18/2014, 11:28 AM  LOS: 7 days    [facility] Office  [phone] Pager - Text Page per [email or web address]  If 7PM-7AM, please contact night-coverage [email or web address]

## 2014-09-18 NOTE — Progress Notes (Signed)
ANTIBIOTIC CONSULT NOTE - FOLLOW UP  Pharmacy Consult for Vancomycin, Cefepime Indication: pneumonia  Allergies  Allergen Reactions  . Penicillins Rash    Patient Measurements: Height: 5\' 7"  (170.2 cm) Weight: 140 lb 10.5 oz (63.8 kg) IBW/kg (Calculated) : 61.6  Vital Signs: Temp: 97.5 F (36.4 C) (03/31 0900) Temp Source: Oral (03/31 0900) BP: 100/64 mmHg (03/31 0700) Pulse Rate: 79 (03/31 0700) Intake/Output from previous day: 03/30 0701 - 03/31 0700 In: 453 [P.O.:300; I.V.:3; IV Piggyback:150] Out: 5250 [Urine:5250] Intake/Output from this shift: Total I/O In: -  Out: 800 [Urine:800]  Labs:  Recent Labs  09/16/14 0100 09/16/14 1800 09/18/14 0300  WBC 8.0  --  7.9  HGB 10.9*  --  10.3*  PLT 302  --  249  CREATININE 0.43* 0.33* 0.34*   Estimated Creatinine Clearance: 84.5 mL/min (by C-G formula based on Cr of 0.34). No results for input(s): VANCOTROUGH, VANCOPEAK, VANCORANDOM, GENTTROUGH, GENTPEAK, GENTRANDOM, TOBRATROUGH, TOBRAPEAK, TOBRARND, AMIKACINPEAK, AMIKACINTROU, AMIKACIN in the last 72 hours.   Microbiology: Recent Results (from the past 720 hour(s))  MRSA PCR Screening     Status: None   Collection Time: 09/12/14 10:27 PM  Result Value Ref Range Status   MRSA by PCR NEGATIVE NEGATIVE Final    Comment:        The GeneXpert MRSA Assay (FDA approved for NASAL specimens only), is one component of a comprehensive MRSA colonization surveillance program. It is not intended to diagnose MRSA infection nor to guide or monitor treatment for MRSA infections.   MRSA PCR Screening     Status: None   Collection Time: 09/18/14  4:00 AM  Result Value Ref Range Status   MRSA by PCR NEGATIVE NEGATIVE Final    Comment:        The GeneXpert MRSA Assay (FDA approved for NASAL specimens only), is one component of a comprehensive MRSA colonization surveillance program. It is not intended to diagnose MRSA infection nor to guide or monitor treatment  for MRSA infections.     Assessment: 49 yo female with lung cancer previously treated in [place] but now with a mass in her lung and requiring further treatment. Pt with fever and increased cough and overnight may have aspirated on vomitus. Pt started onLevaquin and Flagyl (MD didn't want Clindamycin or Primaxin for anaerobic coverage). Now with increased SOB, CXR showing new multifocal PNA and mild pulmonary edema. Abx changed to Cefepime and Vancomycin per Rx.  3/27 >> Vanco >> 3/28 3/27 >> Levaquin >> 3/31 3/27 >> Flagyl >>  3/30 >> Cefepime >> 3/30 >> Vancomycin >>  Tmax: AF WBCs: WNL Renal: SCr low, CrCl >100  3/25 MRSA PCR: negative  Today, 3/31: Day #2 Cefepime 1GM IV q8h and Vancomycin 750mg  IV q8h for PNA. Levaquin/Flagyl were d/c'd today. Since aspiration is the concern, discussed with MD potentially switching Cefepime to Zosyn for better anaerobic coverage but due to concern for cross reactivity with the allergy to penicillin (rash) in this critically ill patient, Flagyl was added back instead.  Goal of Therapy:  Vancomycin trough level 15-20 mcg/ml  Doses adjusted per renal function Eradication of infection  Plan:  1.  Check vancomycin trough tonight. 2.  Continue Cefepime 1g IV q8h.  Hershal Coria 09/18/2014,1:12 PM

## 2014-09-19 ENCOUNTER — Inpatient Hospital Stay (HOSPITAL_COMMUNITY): Payer: 59

## 2014-09-19 LAB — CBC
HCT: 30.9 % — ABNORMAL LOW (ref 36.0–46.0)
Hemoglobin: 10.1 g/dL — ABNORMAL LOW (ref 12.0–15.0)
MCH: 27.6 pg (ref 26.0–34.0)
MCHC: 32.7 g/dL (ref 30.0–36.0)
MCV: 84.4 fL (ref 78.0–100.0)
PLATELETS: 198 10*3/uL (ref 150–400)
RBC: 3.66 MIL/uL — ABNORMAL LOW (ref 3.87–5.11)
RDW: 16 % — AB (ref 11.5–15.5)
WBC: 14 10*3/uL — AB (ref 4.0–10.5)

## 2014-09-19 LAB — BASIC METABOLIC PANEL
Anion gap: 6 (ref 5–15)
BUN: 6 mg/dL (ref 6–23)
CALCIUM: 8.2 mg/dL — AB (ref 8.4–10.5)
CO2: 32 mmol/L (ref 19–32)
Chloride: 94 mmol/L — ABNORMAL LOW (ref 96–112)
Creatinine, Ser: 0.4 mg/dL — ABNORMAL LOW (ref 0.50–1.10)
GFR calc Af Amer: >90 mL/min (ref >90)
GLUCOSE: 138 mg/dL — AB (ref 70–99)
Potassium: 3.7 mmol/L (ref 3.5–5.1)
SODIUM: 132 mmol/L — AB (ref 135–145)

## 2014-09-19 LAB — MAGNESIUM: MAGNESIUM: 1.6 mg/dL (ref 1.5–2.5)

## 2014-09-19 LAB — PHOSPHORUS: PHOSPHORUS: 2.3 mg/dL (ref 2.3–4.6)

## 2014-09-19 MED ORDER — MAGNESIUM SULFATE 2 GM/50ML IV SOLN
2.0000 g | Freq: Once | INTRAVENOUS | Status: AC
  Administered 2014-09-19: 2 g via INTRAVENOUS
  Filled 2014-09-19: qty 50

## 2014-09-19 MED ORDER — CHLORHEXIDINE GLUCONATE 0.12 % MT SOLN
15.0000 mL | Freq: Two times a day (BID) | OROMUCOSAL | Status: DC
Start: 2014-09-19 — End: 2014-09-30
  Administered 2014-09-19 – 2014-09-30 (×20): 15 mL via OROMUCOSAL
  Filled 2014-09-19 (×22): qty 15

## 2014-09-19 MED ORDER — RISAQUAD PO CAPS
1.0000 | ORAL_CAPSULE | Freq: Every day | ORAL | Status: DC
  Administered 2014-09-19 – 2014-09-30 (×11): 1 via ORAL
  Filled 2014-09-19 (×12): qty 1

## 2014-09-19 MED ORDER — CETYLPYRIDINIUM CHLORIDE 0.05 % MT LIQD
7.0000 mL | Freq: Two times a day (BID) | OROMUCOSAL | Status: DC
  Administered 2014-09-19 – 2014-09-30 (×16): 7 mL via OROMUCOSAL

## 2014-09-19 NOTE — Consult Note (Signed)
PULMONARY / CRITICAL CARE MEDICINE   Name: [name] MRN: [id number] DOB: 1965-08-20    ADMISSION DATE:  09/10/2014 CONSULTATION DATE:  3/31  REFERRING MD :  [facility]  CHIEF COMPLAINT:  Hypoxia  INITIAL PRESENTATION: Chest pain  STUDIES:    SIGNIFICANT EVENTS: 3/30 desaturation   BRIEF  49 yo smoker, dx with  Left Lung cancer(july 2015) underwent chemo and Rtx till 2/16 at which time she moved to [place] from [place] for family support.  She is to establish with local Cancer MD but presents to [facility]  3/23 with chest pain and acute dyspnea. She further developed vomiting along with hematemesis and was evaluated by GI services. Seen on 3/26 by Cardiology service for new onset Afib and placed on IV amiodarone. 3/30 she developed increasing sob , O2 desaturation, and was moved to SDU and PCCM asked to evaluate. She is in venti mask at 50%, desaturates with any activity. CT of chest with left lower fibrothorax and metastases. Alert and interactive, she is a full code. PCCm will evaluate.    SUBJECTIVE:  09/19/14: BiPAP overnight. FAce mask in day. Easy desats but says feeling better. CT yesterday - noPE but has tumor v fibrosis around Left pulm artery LLL fibrothorax and new aspiration pneumonitis.   VITAL SIGNS: Temp:  [97.5 F (36.4 C)-98.5 F (36.9 C)] 97.9 F (36.6 C) (04/01 0800) Pulse Rate:  [71-89] 74 (04/01 1000) Resp:  [11-29] 17 (04/01 1000) BP: (101-140)/(65-96) 106/78 mmHg (04/01 1000) SpO2:  [83 %-98 %] 90 % (04/01 1040) FiO2 (%):  [60 %-100 %] 98 % (04/01 1040) Weight:  [63.4 kg (139 lb 12.4 oz)] 63.4 kg (139 lb 12.4 oz) (04/01 0500) HEMODYNAMICS:   VENTILATOR SETTINGS: Vent Mode:  [-]  FiO2 (%):  [60 %-100 %] 98 % INTAKE / OUTPUT:  Intake/Output Summary (Last 24 hours) at 09/19/14 1103 Last data filed at 09/19/14 1035  Gross per 24 hour  Intake   1120 ml  Output   1550 ml  Net   -430 ml    PHYSICAL EXAMINATION: General: thin female Neuro:   Intact HEENT:  No jvd/lan Cardiovascular:  HSR RRR Lungs:  Decreased air movement Abdomen:  Dereased bs Musculoskeletal:  intact Skin:  Warm, tatoos +  LABS:  CBC  Recent Labs Lab 09/16/14 0100 09/18/14 0300 09/19/14 0500  WBC 8.0 7.9 14.0*  HGB 10.9* 10.3* 10.1*  HCT 33.9* 30.3* 30.9*  PLT 302 249 198   Coag's No results for input(s): APTT, INR in the last 168 hours. BMET  Recent Labs Lab 09/16/14 1800 09/18/14 0300 09/19/14 0500  NA 133* 131* 132*  K 3.5 3.2* 3.7  CL 99 92* 94*  CO2 28 31 32  BUN 5* 6 6  CREATININE 0.33* 0.34* 0.40*  GLUCOSE 106* 131* 138*   Electrolytes  Recent Labs Lab 09/13/14 1712 09/16/14 0100 09/16/14 1800 09/18/14 0300 09/19/14 0500  CALCIUM 9.1 7.9* 7.6* 7.8* 8.2*  MG 1.7 1.4*  --   --  1.6  PHOS  --   --   --   --  2.3   Sepsis Markers  Recent Labs Lab 09/18/14 0300  LATICACIDVEN 1.2   ABG No results for input(s): PHART, PCO2ART, PO2ART in the last 168 hours. Liver Enzymes No results for input(s): AST, ALT, ALKPHOS, BILITOT, ALBUMIN in the last 168 hours. Cardiac Enzymes  Recent Labs Lab 09/13/14 1712 09/13/14 2259 09/14/14 0330  TROPONINI <0.03 <0.03 <0.03   Glucose No results for  input(s): GLUCAP in the last 168 hours.  Imaging Ct Angio Chest Pe W/cm &/or Wo Cm  09/18/2014   CLINICAL DATA:  49 year old female with left lung cancer, chemotherapy until February. Chest pain and shortness of breath for 1 week. Initial encounter.  EXAM: CT ANGIOGRAPHY CHEST WITH CONTRAST  TECHNIQUE: Multidetector CT imaging of the chest was performed using the standard protocol during bolus administration of intravenous contrast. Multiplanar CT image reconstructions and MIPs were obtained to evaluate the vascular anatomy.  CONTRAST:  75mL OMNIPAQUE IOHEXOL 350 MG/ML SOLN  COMPARISON:  Chest CT 09/10/2014.  FINDINGS: Good contrast bolus timing in the pulmonary arterial tree. Mass effect on the left main pulmonary artery with  narrowing, but the vessel remains patent. No left side pulmonary artery branch occlusion identified. Elsewhere no filling defect identified to suggest the presence of acute pulmonary embolism.  New bilateral pleural effusions, mostly loculated and/or sub pulmonic on the left and moderate on the right. Progressed pleural based soft tissue mass in the left upper lung measuring 2 cm now (1.7 cm last week). Small pericardial effusion is new. New widespread abnormal mostly interstitial or ground-glass opacity in the right lung. Only in the superior segment of the lower lobe is spared. New abnormal confluent left lower lobe opacity along the surface of the diaphragm and also surrounding loculated appearing left lung base fluid. Stable Major airways with high-grade stenosis of the left mainstem bronchus. Post treatment related collapse of a portion of the medial left upper lung appears stable. Underlying emphysema.  Subjacent to the abnormal fluid and lung density at the left lung base the medial superior aspect of the spleen is now abnormally enhancing (see series 4, image 72 and coronal image 84 of series 605. This is patchy and not wedge-shaped. Also somewhat remote from this area there is mild hypo enhancement at the anterior aspect of the spleen. The appearance does not resemble normal early splenic artery enhancement.  The visible liver is grossly stable. Visible adrenal glands, renal parenchyma, pancreas, and bowel in the abdomen do not appear significantly changed.  Negative visualized aorta new abnormal mediastinal soft tissue or fluid most pronounced in the sub carina region (series 4, image 43). Suggestion of associated thickening of the thoracic esophagus. There is small volume retained hyperdense material within the esophagus.  No axillary lymphadenopathy.  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 There may also be early  involvement of the posterior tenth rib on series 7, image 78. There is a left lateral destructive soft tissue mass at the left eighth rib which has not significantly changed (series 7, image 75). Right chest porta cath remains in place. No destructive lesion identified in the spine.  Review of the MIP images confirms the above findings.  IMPRESSION: 1. No evidence of acute pulmonary embolus. Tumor versus post treatment fibrosis narrowing the left pulmonary arteries. 2. New severe abnormal pulmonary opacity in both lungs since 09/10/2014 most resembles bilateral pneumonia. Widespread endobronchial spread of tumor also is possible. 3. New bilateral pleural effusions, loculated on the left and associated with pleural and chest wall tumor involvement. Subjacent abnormal enhancement in the spleen is suspicious and suspected to reflect infiltration of either tumor or infection through the diaphragm. 4. Multiple destructive left rib metastases, with pleural and chest wall involvement.   Electronically Signed   By: Genevie Ann M.D.   On: 09/18/2014 12:12   Dg Chest [facility]  09/18/2014   CLINICAL DATA:  Respiratory failure, malignancy with history of left lung cancer July 2015 having undergone chemotherapy and radiation therapy completed February 2016  EXAM: PORTABLE CHEST - 1 VIEW  COMPARISON:  09/17/2014  FINDINGS: Moderate left pleural effusions stable. Volume loss left thorax with shift of mediastinum for the left, stable. Heavy interstitial change throughout the right lung stable, with milder interstitial change on the left. Abnormal opacity retrocardiac left lower lobe stable. No change in position of Port-A-Cath. Stable mild right costophrenic angle blunting.  IMPRESSION: No significant change from prior study. Persistent left effusion and left lower lobe consolidation as well as interstitial prominence suggesting pulmonary edema.   Electronically Signed   By: Skipper Cliche M.D.   On: 09/18/2014 10:15     Intake/Output Summary (Last 24 hours) at 09/19/14 1103 Last data filed at 09/19/14 1035  Gross per 24 hour  Intake   1120 ml  Output   1550 ml  Net   -430 ml    ASSESSMENT / PLAN:  PULMONARY OETT A: Lung cancer unknown type but stage 3 per her hx  Current   - Acute resp failure due to aspiration pna 09/17/14. PE ruled out  - needing bipap overnight and face mask in day  P:   BiPAP QHS + prn  O2 for pulse ox > 88% Intubate if worse Diuresis BD's Consider dc amio   CARDIOVASCULAR CVL potra cath>> A:  A fib RVR   P:  Cards is following No anticoagulation due to GI bleed.  xopenex   RENAL  A:   Low Mag P:   Replete Mag  GASTROINTESTINAL A:   GIB P:   Per GI PPI  HEMATOLOGIC AAnemia of critical illnes P PRBC per ICU guidelines  ONC A:   Lung cancer NOS s/p XRT and chemo in [place], [place] P:   Needs hemonc consult - [facility] to organize  INFECTIOUS A:   Suspected aspiration pna in immunocompromised pt   P:   BCx2  UC Sputum Abx 3/30 maxipime>> 3/28 levaquin>> 3/28 flagyl>> 3/30 vamco>>  ENDOCRINE A:   SIDM  P:   SSI  NEUROLOGIC A:   No acute issue P:   RASS goal:0 Alert    FAMILY  - Updates: patient updated  - Inter-disciplinary family meet or Palliative Care meeting due by:  day 7      Dr. Brand Males, M.D., [facility].C.P Pulmonary and Critical Care Medicine Staff Physician [facility] Pager: [phone], If no answer or between  15:00h - 7:00h: call [phone]  09/19/2014 11:12 AM

## 2014-09-19 NOTE — Progress Notes (Signed)
Pt pivoted to BSC on venti mask and desat'd to 79%. Pt reached 90% after 10 minutes of recovering.

## 2014-09-19 NOTE — Progress Notes (Signed)
[facility] Progress Note  Subjective:  no more nausea vomiting or dysphagia, tolerating diet  Objective: Vital signs in last 24 hours: Temp:  [97.5 F (36.4 C)-98.5 F (36.9 C)] 97.9 F (36.6 C) (04/01 0800) Pulse Rate:  [71-89] 74 (04/01 1000) Resp:  [11-29] 17 (04/01 1000) BP: (101-140)/(65-96) 106/78 mmHg (04/01 1000) SpO2:  [83 %-98 %] 90 % (04/01 1040) FiO2 (%):  [60 %-100 %] 98 % (04/01 1040) Weight:  [63.4 kg (139 lb 12.4 oz)] 63.4 kg (139 lb 12.4 oz) (04/01 0500) Weight change: -0.4 kg (-14.1 oz)   YW:[id number]  Lab Results: Results for orders placed or performed during the hospital encounter of 09/10/14 (from the past 24 hour(s))  Vancomycin, trough     Status: None   Collection Time: 09/18/14  6:30 PM  Result Value Ref Range   Vancomycin Tr 11.2 10.0 - 20.0 ug/mL  CBC     Status: Abnormal   Collection Time: 09/19/14  5:00 AM  Result Value Ref Range   WBC 14.0 (H) 4.0 - 10.5 K/uL   RBC 3.66 (L) 3.87 - 5.11 MIL/uL   Hemoglobin 10.1 (L) 12.0 - 15.0 g/dL   HCT 30.9 (L) 36.0 - 46.0 %   MCV 84.4 78.0 - 100.0 fL   MCH 27.6 26.0 - 34.0 pg   MCHC 32.7 30.0 - 36.0 g/dL   RDW 16.0 (H) 11.5 - 15.5 %   Platelets 198 150 - 400 K/uL  Basic metabolic panel     Status: Abnormal   Collection Time: 09/19/14  5:00 AM  Result Value Ref Range   Sodium 132 (L) 135 - 145 mmol/L   Potassium 3.7 3.5 - 5.1 mmol/L   Chloride 94 (L) 96 - 112 mmol/L   CO2 32 19 - 32 mmol/L   Glucose, Bld 138 (H) 70 - 99 mg/dL   BUN 6 6 - 23 mg/dL   Creatinine, Ser 0.40 (L) 0.50 - 1.10 mg/dL   Calcium 8.2 (L) 8.4 - 10.5 mg/dL   GFR calc non Af Amer >90 >90 mL/min   GFR calc Af Amer >90 >90 mL/min   Anion gap 6 5 - 15  Phosphorus     Status: None   Collection Time: 09/19/14  5:00 AM  Result Value Ref Range   Phosphorus 2.3 2.3 - 4.6 mg/dL  Magnesium     Status: None   Collection Time: 09/19/14  5:00 AM  Result Value Ref Range   Magnesium 1.6 1.5 - 2.5 mg/dL     Studies/Results: Ct Angio Chest Pe W/cm &/or Wo Cm  09/18/2014   CLINICAL DATA:  49 year old female with left lung cancer, chemotherapy until February. Chest pain and shortness of breath for 1 week. Initial encounter.  EXAM: CT ANGIOGRAPHY CHEST WITH CONTRAST  TECHNIQUE: Multidetector CT imaging of the chest was performed using the standard protocol during bolus administration of intravenous contrast. Multiplanar CT image reconstructions and MIPs were obtained to evaluate the vascular anatomy.  CONTRAST:  59mL OMNIPAQUE IOHEXOL 350 MG/ML SOLN  COMPARISON:  Chest CT 09/10/2014.  FINDINGS: Good contrast bolus timing in the pulmonary arterial tree. Mass effect on the left main pulmonary artery with narrowing, but the vessel remains patent. No left side pulmonary artery branch occlusion identified. Elsewhere no filling defect identified to suggest the presence of acute pulmonary embolism.  New bilateral pleural effusions, mostly loculated and/or sub pulmonic on the left and moderate on the right. Progressed pleural based soft tissue mass in the left upper lung  measuring 2 cm now (1.7 cm last week). Small pericardial effusion is new. New widespread abnormal mostly interstitial or ground-glass opacity in the right lung. Only in the superior segment of the lower lobe is spared. New abnormal confluent left lower lobe opacity along the surface of the diaphragm and also surrounding loculated appearing left lung base fluid. Stable Major airways with high-grade stenosis of the left mainstem bronchus. Post treatment related collapse of a portion of the medial left upper lung appears stable. Underlying emphysema.  Subjacent to the abnormal fluid and lung density at the left lung base the medial superior aspect of the spleen is now abnormally enhancing (see series 4, image 72 and coronal image 84 of series 605. This is patchy and not wedge-shaped. Also somewhat remote from this area there is mild hypo enhancement at the  anterior aspect of the spleen. The appearance does not resemble normal early splenic artery enhancement.  The visible liver is grossly stable. Visible adrenal glands, renal parenchyma, pancreas, and bowel in the abdomen do not appear significantly changed.  Negative visualized aorta new abnormal mediastinal soft tissue or fluid most pronounced in the sub carina region (series 4, image 43). Suggestion of associated thickening of the thoracic esophagus. There is small volume retained hyperdense material within the esophagus.  No axillary lymphadenopathy.  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 There may also be early involvement of the posterior tenth rib on series 7, image 78. There is a left lateral destructive soft tissue mass at the left eighth rib which has not significantly changed (series 7, image 75). Right chest porta cath remains in place. No destructive lesion identified in the spine.  Review of the MIP images confirms the above findings.  IMPRESSION: 1. No evidence of acute pulmonary embolus. Tumor versus post treatment fibrosis narrowing the left pulmonary arteries. 2. New severe abnormal pulmonary opacity in both lungs since 09/10/2014 most resembles bilateral pneumonia. Widespread endobronchial spread of tumor also is possible. 3. New bilateral pleural effusions, loculated on the left and associated with pleural and chest wall tumor involvement. Subjacent abnormal enhancement in the spleen is suspicious and suspected to reflect infiltration of either tumor or infection through the diaphragm. 4. Multiple destructive left rib metastases, with pleural and chest wall involvement.   Electronically Signed   By: Genevie Ann M.D.   On: 09/18/2014 12:12   Dg Chest Port 1 View  09/19/2014   CLINICAL DATA:  Respiratory failure.  EXAM: PORTABLE CHEST - 1 VIEW  COMPARISON:  Chest CT 12 hr prior  FINDINGS: Tip of the right chest port  remains in the proximal SVC. Unchanged elevation of left hemidiaphragm with left pleural effusion. Diffuse parenchymal opacity throughout the right lung, mildly worsened in the right upper lobes. Persistent blunting of right costophrenic angle. Cardiomediastinal contours are unchanged.  IMPRESSION: 1. Diffuse parenchymal opacity throughout the right lung, mild worsening in the right upper lobe. 2. Unchanged appearance of the left hemithorax.   Electronically Signed   By: Jeb Levering M.D.   On: 09/19/2014 00:44   Dg Chest Port 1 View  09/18/2014   CLINICAL DATA:  Respiratory failure, malignancy with history of left lung cancer July 2015 having undergone chemotherapy and radiation therapy completed February 2016  EXAM: PORTABLE CHEST - 1 VIEW  COMPARISON:  09/17/2014  FINDINGS: Moderate left pleural effusions stable. Volume loss left thorax with shift of mediastinum for the left, stable. Heavy interstitial  change throughout the right lung stable, with milder interstitial change on the left. Abnormal opacity retrocardiac left lower lobe stable. No change in position of Port-A-Cath. Stable mild right costophrenic angle blunting.  IMPRESSION: No significant change from prior study. Persistent left effusion and left lower lobe consolidation as well as interstitial prominence suggesting pulmonary edema.   Electronically Signed   By: Skipper Cliche M.D.   On: 09/18/2014 10:15   Dg Chest Port 1 View  09/18/2014   CLINICAL DATA:  Respiratory distress. Hypoxia. History of lung cancer.  EXAM: PORTABLE CHEST - 1 VIEW  COMPARISON:  Most recent radiograph earlier this day at 0122 hr, Chest CT 323 [id number]  FINDINGS: Volume loss in the left hemithorax, however mildly improved aeration from prior exam. Left basilar consolidation and pleural effusion, mildly improved. Slight decrease in cardiomegaly. Interstitial edema, similar to prior. Multifocal parenchymal opacities in the periphery of the right lung, improved in the mid  lung zone. Right chest port remains in place. Unchanged blunting of the right costophrenic angle. There is no pneumothorax.  IMPRESSION: 1. Improved radiographic appearance of the chest with slight improvement in left basilar and right mid lung zone aeration. 2. Pulmonary edema and multifocal opacities in the periphery of the right lung, unchanged.   Electronically Signed   By: Jeb Levering M.D.   On: 09/18/2014 00:14      Assessment:Severe esophagitis probably part radiation plus reflux   Plan: Cont double dose PPI x 3 months. Soft diet as tolerated. Will so for now.    Benney [place] C 09/19/2014, 11:00 AM

## 2014-09-19 NOTE — Progress Notes (Signed)
[facility] Progress Note   [name] DOB: [birth year] DOA: 09/10/2014 PCP: No primary care provider on file.  Brief narrative: [name] is a 49 y.o. female with Lung cancer who was treated for it in [place]. She was told recently after a PET scan that she had a mass in her lung and needed further treatment but she decided to move here with her brother to help care for her. Her records were sent to Dr Julien Nordmann. She presented to the hospital on Tues with ongoing severe left chest pain and was admitted for pain control. 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 As well patient developed A. fib with RVR, due to acute illness required to be on any drip for couple times, back to normal sinus rhythm, not anti-coagulation candidate.  Subjective: Patient in mild respiratory distress, on BiPAP, complaining of cough .  Assessment/Plan:  acute hypoxic resp failure -  In the setting of lung cancer, scarring from previous radiation, and COPD at baseline, apears to be worsened secondary to HCAP. -  on IV steroids given her known history of COPD, continue to taper down., nebulizer treatment. - On broad-spectrum antibiotics for aspiration pneumonia. - No further evidence of volume overload. -  CT  Chest angiogram negative for PE . - Pulmonary consult appreciated.    Cancer associated pain - she has been on narcotics for months for pain starting at her left lower rib cage and radiating upwards towards axilla- the pain was severe even prior to coming to [facility] per brother and patient - has a lesion in left post 9th rib which is the only imaging finding that could be casing her pain - much better controlled   HCAP  ( aspiration pneumonia) - may have aspirated on vomitus- started HCAP coverage (used Vanc/ Levaquin and Flagyl) on  3/27 as symptoms started on her 5th day in the hospital - Patient had deterioration of respiratory status 3/29, CT chest showing significant progression of her pneumonia,    Hematemesis - no prior history of this - vomited blood three times on 3/25- transferred to SDU-  - started BID Prontonix, stoppped Heparin for DVT prophylaxis - was also given a dose of Toradol on 3/25 which could have contributed - 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    HCAP  ( aspiration pneumonia) - may have aspirated on vomitus- started HCAP coverage (used Vanc/ Levaquin and Flagyl) on 3/27 as symptoms started on her 5th day in the hospital - obtained CXR which showed diffuse infiltrates which were likely from aspiration pneumonitis - Patient had deterioration of respiratory status , antibiotic regimen changed to vancomycin, cefepime, Flagyl . - Continue with pulmonary toilet and nebs as needed.    Lung cancer, primary, with metastasis from lung to other site - was told the she needed further  chemo after nodule seen on PET- patient decided to come stay with her brother to continue treatment here with Dr Julien Nordmann and presented to the hospital the next day due to uncontrolled pain . - CT showing chest wall involvement. -- Appt made for Dr Julien Nordmann for 4/5-     Anorexia-  - started Ensure and Megace-   Ongoing smoking - Nicotine patch    Code  Status: full code, overall prognosis is poor Family Communication: None at bedside Disposition Plan: Remains on step down DVT prophylaxis: SCDs Consultants: GI Procedures:  1. EGD: very severe esophagitis possibly due to previous radiation combined with reflux  2. ECHO:  Left ventricle: LVEF is grossly normal at approximately 60% Diffiuclt to fully evaluate regional wall motion as endocardium is difficult to see well. The cavity size was normal. Wall thickness was normal. - Pulmonary arteries: PA peak pressure: 34 mm Hg (S).  Antibiotics: Anti-infectives    Start     Dose/Rate Route Frequency Ordered Stop   09/18/14 2000  vancomycin (VANCOCIN) IVPB 1000 mg/200 mL premix     1,000 mg 200 mL/hr over 60 Minutes Intravenous Every 8 hours 09/18/14 1918     09/18/14 1400  metroNIDAZOLE (FLAGYL) IVPB 500 mg     500 mg 100 mL/hr over 60 Minutes Intravenous Every 8 hours 09/18/14 1305     09/17/14 0400  vancomycin (VANCOCIN) IVPB 750 mg/150 ml premix  Status:  Discontinued     750 mg 150 mL/hr over 60 Minutes Intravenous Every 8 hours 09/17/14 0159 09/18/14 1918   09/17/14 0300  ceFEPIme (MAXIPIME) 1 g in dextrose 5 % 50 mL IVPB     1 g 100 mL/hr over 30 Minutes Intravenous 3 times per day 09/17/14 0154     09/15/14 1000  levofloxacin (LEVAQUIN) tablet 750 mg  Status:  Discontinued     750 mg Oral Daily 09/15/14 0751 09/18/14 1201   09/15/14 0800  metroNIDAZOLE (FLAGYL) tablet 500 mg  Status:  Discontinued     500 mg Oral 3 times per day 09/15/14 0751 09/18/14 1201   09/14/14 1000  metroNIDAZOLE (FLAGYL) IVPB 500 mg  Status:  Discontinued     500 mg 100 mL/hr over 60 Minutes Intravenous Every 8 hours 09/14/14 0845 09/15/14 0751   09/14/14 1000  levofloxacin (LEVAQUIN) IVPB 750 mg  Status:  Discontinued     750 mg 100 mL/hr over 90 Minutes Intravenous Every 24 hours 09/14/14 0847 09/15/14 0751   09/14/14 0900  vancomycin (VANCOCIN) 500 mg in sodium chloride 0.9 % 100 mL IVPB   Status:  Discontinued     500 mg 100 mL/hr over 60 Minutes Intravenous Every 8 hours 09/14/14 0850 09/15/14 1301      Objective: Filed Weights   09/17/14 0507 09/18/14 0400 09/19/14 0500  Weight: 63.7 kg (140 lb 6.9 oz) 63.8 kg (140 lb 10.5 oz) 63.4 kg (139 lb 12.4 oz)    Intake/Output Summary (Last 24 hours) at 09/19/14 0930 Last data filed at 09/19/14 0700  Gross per 24 hour  Intake   1120 ml  Output   1550 ml  Net   -430 ml     Vitals Filed Vitals:   09/19/14 0700 09/19/14 0800 09/19/14 0832 09/19/14 0835  BP: 106/77   106/70  Pulse: 74   75  Temp:  97.9 F (36.6 C)    TempSrc:  Oral    Resp: 16   17  Height:      Weight:  SpO2: 93%  94% 94%    Exam:  General:  Pt is alert, , in mild respiratory distress, on Bipap  HEENT: No icterus, No thrush  Cardiovascular: regular rate and rhythm, S1/S2 No murmur  Respiratory: decreased air entry on the left, rales at left lung base.  Abdomen: Soft, +Bowel sounds, non tender, non distended, no guarding  MSK: No LE edema, cyanosis or clubbing  Data Reviewed: Basic Metabolic Panel:  Recent Labs Lab 09/13/14 1712 09/16/14 0100 09/16/14 1800 09/18/14 0300 09/19/14 0500  NA 136 131* 133* 131* 132*  K 3.9 2.9* 3.5 3.2* 3.7  CL 96 93* 99 92* 94*  CO2 27 31 28 31  32  GLUCOSE 109* 93 106* 131* 138*  BUN 8 8 5* 6 6  CREATININE 0.46* 0.43* 0.33* 0.34* 0.40*  CALCIUM 9.1 7.9* 7.6* 7.8* 8.2*  MG 1.7 1.4*  --   --  1.6  PHOS  --   --   --   --  2.3   Liver Function Tests: No results for input(s): AST, ALT, ALKPHOS, BILITOT, PROT, ALBUMIN in the last 168 hours. No results for input(s): LIPASE, AMYLASE in the last 168 hours. No results for input(s): AMMONIA in the last 168 hours. CBC:  Recent Labs Lab 09/13/14 0400 09/14/14 0330 09/16/14 0100 09/18/14 0300 09/19/14 0500  WBC 6.8 6.1 8.0 7.9 14.0*  NEUTROABS  --   --   --  7.3  --   HGB 11.7* 10.5* 10.9* 10.3* 10.1*  HCT 36.7 32.3* 33.9* 30.3* 30.9*  MCV  87.4 87.3 86.3 84.4 84.4  PLT 349 278 302 249 198   Cardiac Enzymes:  Recent Labs Lab 09/13/14 1712 09/13/14 2259 09/14/14 0330  TROPONINI <0.03 <0.03 <0.03   BNP (last 3 results) No results for input(s): BNP in the last 8760 hours.  ProBNP (last 3 results) No results for input(s): PROBNP in the last 8760 hours.  CBG: No results for input(s): GLUCAP in the last 168 hours.  Recent Results (from the past 240 hour(s))  MRSA PCR Screening     Status: None   Collection Time: 09/12/14 10:27 PM  Result Value Ref Range Status   MRSA by PCR NEGATIVE NEGATIVE Final    Comment:        The GeneXpert MRSA Assay (FDA approved for NASAL specimens only), is one component of a comprehensive MRSA colonization surveillance program. It is not intended to diagnose MRSA infection nor to guide or monitor treatment for MRSA infections.   MRSA PCR Screening     Status: None   Collection Time: 09/18/14  4:00 AM  Result Value Ref Range Status   MRSA by PCR NEGATIVE NEGATIVE Final    Comment:        The GeneXpert MRSA Assay (FDA approved for NASAL specimens only), is one component of a comprehensive MRSA colonization surveillance program. It is not intended to diagnose MRSA infection nor to guide or monitor treatment for MRSA infections.      Studies:  Recent x-ray studies have been reviewed in detail by the Attending Physician  Scheduled Meds:  Scheduled Meds: . acidophilus  1 capsule Oral Daily  . amiodarone  400 mg Oral BID  . antiseptic oral rinse  7 mL Mouth Rinse BID  . antiseptic oral rinse  7 mL Mouth Rinse q12n4p  . ceFEPime (MAXIPIME) IV  1 g Intravenous 3 times per day  . chlorhexidine  15 mL Mouth Rinse BID  . dextromethorphan-guaiFENesin  1 tablet Oral BID  .  feeding supplement (ENSURE ENLIVE)  237 mL Oral TID BM  . feeding supplement (RESOURCE BREEZE)  1 Container Oral TID BM  . fentaNYL  100 mcg Transdermal Q72H  . ipratropium  0.5 mg Nebulization Q6H  .  levalbuterol  0.63 mg Nebulization 4 times per day  . megestrol  400 mg Oral BID  . methylPREDNISolone (SOLU-MEDROL) injection  40 mg Intravenous Q12H  . metronidazole  500 mg Intravenous Q8H  . morphine  30 mg Oral Q12H  . nicotine  14 mg Transdermal Daily  . ondansetron  4 mg Oral Q6H   Or  . ondansetron (ZOFRAN) IV  4 mg Intravenous Q6H  . pantoprazole (PROTONIX) IV  40 mg Intravenous Q12H  . polyethylene glycol  17 g Oral Daily  . senna-docusate  1 tablet Oral QHS  . sodium chloride  3 mL Intravenous Q12H  . vancomycin  1,000 mg Intravenous Q8H   Continuous Infusions:    Time spent on care of this patient: 40 min   Rannie Craney, MD [phone] 09/19/2014, 9:30 AM  LOS: 8 days   [facility] Office  [phone] Pager - Text Page per [email or web address]  If 7PM-7AM, please contact night-coverage [email or web address]

## 2014-09-19 NOTE — Progress Notes (Signed)
PT Cancellation Note  Patient Details Name: [name] MRN: [id number] DOB: 24-Oct-1965   Cancelled Treatment:     Pt unable to tolerate today, per RN   Brandon Melnick, Donnel Saxon 09/19/2014, 2:13 PM

## 2014-09-19 NOTE — Progress Notes (Signed)
    Subjective:  Denies CP; denies dyspnea; on BIPAP   Objective:  Filed Vitals:   09/19/14 0413 09/19/14 0500 09/19/14 0600 09/19/14 0700  BP:  125/83 105/68 106/77  Pulse:  76 71 74  Temp: 98.5 F (36.9 C)     TempSrc: Oral     Resp:  17 15 16   Height:      Weight:  139 lb 12.4 oz (63.4 kg)    SpO2:  93% 93% 93%    Intake/Output from previous day:  Intake/Output Summary (Last 24 hours) at 09/19/14 0804 Last data filed at 09/19/14 0700  Gross per 24 hour  Intake   1120 ml  Output   2350 ml  Net  -1230 ml    Physical Exam: Physical exam: Well-developed chronically ill appearing in no acute distress.  Skin is warm and dry.  HEENT is normal.  Neck is supple.  Chest diminished BS throughout (>LLL) Cardiovascular exam is regular rate and rhythm.  Abdominal exam nontender or distended. No masses palpated. Extremities show no edema. neuro grossly intact    Lab Results: Basic Metabolic Panel:  Recent Labs  09/18/14 0300 09/19/14 0500  NA 131* 132*  K 3.2* 3.7  CL 92* 94*  CO2 31 32  GLUCOSE 131* 138*  BUN 6 6  CREATININE 0.34* 0.40*  CALCIUM 7.8* 8.2*  MG  --  1.6  PHOS  --  2.3   CBC:  Recent Labs  09/18/14 0300 09/19/14 0500  WBC 7.9 14.0*  NEUTROABS 7.3  --   HGB 10.3* 10.1*  HCT 30.3* 30.9*  MCV 84.4 84.4  PLT 249 198     Assessment/Plan:  1. Atrial fibrillation with RVR. Probably related to stress of underlying pulmonary problems. [place] Vasc score of 1. Not a candidate for anticoagulation due to recent GI bleed. Continue amiodarone 400 mg po BID; decrease to 200 mg daily after 2 weeks; she would be high likelihood of recurrent afib if amiodarone DCed. 2. Metastatic Lung CA-remains hypoxic this AM; patient being treated with antibiotics for pneumonia and diuresis.  3. 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 Severe hypokalemia-improved We will see again Monday; please call over weekend with questions.  Kirk Ruths 09/19/2014, 8:04  AM

## 2014-09-20 ENCOUNTER — Inpatient Hospital Stay (HOSPITAL_COMMUNITY): Payer: 59

## 2014-09-20 DIAGNOSIS — J189 Pneumonia, unspecified organism: Secondary | ICD-10-CM | POA: Insufficient documentation

## 2014-09-20 LAB — CBC
HCT: 32.9 % — ABNORMAL LOW (ref 36.0–46.0)
Hemoglobin: 10.6 g/dL — ABNORMAL LOW (ref 12.0–15.0)
MCH: 27.6 pg (ref 26.0–34.0)
MCHC: 32.2 g/dL (ref 30.0–36.0)
MCV: 85.7 fL (ref 78.0–100.0)
Platelets: 156 10*3/uL (ref 150–400)
RBC: 3.84 MIL/uL — ABNORMAL LOW (ref 3.87–5.11)
RDW: 16.2 % — ABNORMAL HIGH (ref 11.5–15.5)
WBC: 13.6 10*3/uL — ABNORMAL HIGH (ref 4.0–10.5)

## 2014-09-20 LAB — BASIC METABOLIC PANEL
Anion gap: 8 (ref 5–15)
BUN: 9 mg/dL (ref 6–23)
CHLORIDE: 98 mmol/L (ref 96–112)
CO2: 30 mmol/L (ref 19–32)
CREATININE: 0.41 mg/dL — AB (ref 0.50–1.10)
Calcium: 8.3 mg/dL — ABNORMAL LOW (ref 8.4–10.5)
GFR calc Af Amer: >90 mL/min (ref >90)
GLUCOSE: 123 mg/dL — AB (ref 70–99)
POTASSIUM: 4.1 mmol/L (ref 3.5–5.1)
Sodium: 136 mmol/L (ref 135–145)

## 2014-09-20 LAB — VANCOMYCIN, TROUGH: Vancomycin Tr: 17 ug/mL (ref 10.0–20.0)

## 2014-09-20 MED ORDER — LIP MEDEX EX OINT
TOPICAL_OINTMENT | CUTANEOUS | Status: DC | PRN
  Administered 2014-09-20: 10:00:00 via TOPICAL
  Filled 2014-09-20: qty 7

## 2014-09-20 MED ORDER — HEPARIN SODIUM (PORCINE) 5000 UNIT/ML IJ SOLN
5000.0000 [IU] | Freq: Three times a day (TID) | INTRAMUSCULAR | Status: DC
  Administered 2014-09-20 – 2014-09-23 (×9): 5000 [IU] via SUBCUTANEOUS
  Filled 2014-09-20 (×10): qty 1

## 2014-09-20 NOTE — Progress Notes (Signed)
XRay report obtained for PAC placement.  Due to jugular region position of PAC tubing, expect no consistent blood return from [facility] but ok to continue to use to infuse IVF.  Maudie Mercury, RN aware that radiology recommends repositioning.

## 2014-09-20 NOTE — Progress Notes (Signed)
[facility] Progress Note   [name] YNW:[id number] DOB: September 30, 1965 DOA: 09/10/2014 PCP: No primary care provider on file.  Brief narrative: [name] is a 49 y.o. female with Lung cancer who was treated for it in [place]. She was told recently after a PET scan that she had a mass in her lung and needed further treatment but she decided to move here with her brother to help care for her. Her records were sent to Dr Julien Nordmann. She presented to the hospital on Tues with ongoing severe left chest pain and was admitted for pain control. 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 As well patient developed A. fib with RVR, due to acute illness required to be on any drip for couple times, back to normal sinus rhythm, not anti-coagulation candidate.  Subjective: Patient in mild respiratory distress, on BiPAP, complaining of cough .  Assessment/Plan:  acute hypoxic resp failure -  In the setting of lung cancer, scarring from previous radiation, and COPD at baseline, apears to be worsened secondary to HCAP. -  Initially on short course IV steroids given her known history of COPD, discontinue giving no wheezing, continue with nebulizer treatment. - On broad-spectrum antibiotics for aspiration pneumonia. - No further evidence of volume overload. - CT  Chest angiogram negative for PE . - Pulmonary consult appreciated.    Cancer associated pain - she has been on narcotics for months for pain starting at her left lower rib cage and radiating upwards towards axilla- the pain was severe even prior to coming to [facility] per brother and patient - has a lesion in left post 9th rib which is the only imaging finding that could be casing her pain - much better controlled   HCAP  ( aspiration pneumonia) - may have aspirated on vomitus- started HCAP  coverage (used Vanc/ Levaquin and Flagyl) on 3/27 as symptoms started on her 5th day in the hospital - Patient had deterioration of respiratory status 3/29, CT chest showing significant progression of her pneumonia, so currently on vancomycin/cefepime/Flagyl - Continue with pulmonary toilet and nebs as needed.   Hematemesis/ secondary to radiation esophagitis - vomited blood three times on 3/25- transferred to SDU-  - started BID Prontonix, stoppped Heparin for DVT prophylaxis, will resume on subcutaneous heparin for DVT prophylaxis, given no recurrence of hematemesis, hemoglobin been stable, and patient is high risk for DVT. Will monitor CBC closely. 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     Lung cancer, primary, with metastasis from lung to other site - was told the she needed further chemo after nodule seen on PET- patient decided to come stay with her brother to continue treatment here with Dr Julien Nordmann and presented to the hospital the next day due  to uncontrolled pain . - CT showing chest wall involvement. -- Appt made for Dr Julien Nordmann for 4/5-     Anorexia-  - started Ensure and Megace-   Ongoing smoking - Nicotine patch    Code Status: full code, overall prognosis is poor Family Communication: None at bedside Disposition Plan: Remains on step  down DVT prophylaxis:  Subcutaneous heparin Consultants: GI, pulmonary , cardiology  Procedures:  1. EGD: very severe esophagitis possibly due to previous radiation combined with reflux  2. ECHO:  Left ventricle: LVEF is grossly normal at approximately 60% Diffiuclt to fully evaluate regional wall motion as endocardium is difficult to see well. The cavity size was normal. Wall thickness was normal. - Pulmonary arteries: PA peak pressure: 34 mm Hg (S).  Antibiotics: Anti-infectives    Start     Dose/Rate Route Frequency Ordered Stop   09/18/14 2000  vancomycin (VANCOCIN) IVPB 1000 mg/200 mL premix     1,000 mg 200 mL/hr over 60 Minutes Intravenous Every 8 hours 09/18/14 1918     09/18/14 1400  metroNIDAZOLE (FLAGYL) IVPB 500 mg     500 mg 100 mL/hr over 60 Minutes Intravenous Every 8 hours 09/18/14 1305     09/17/14 0400  vancomycin (VANCOCIN) IVPB 750 mg/150 ml premix  Status:  Discontinued     750 mg 150 mL/hr over 60 Minutes Intravenous Every 8 hours 09/17/14 0159 09/18/14 1918   09/17/14 0300  ceFEPIme (MAXIPIME) 1 g in dextrose 5 % 50 mL IVPB     1 g 100 mL/hr over 30 Minutes Intravenous 3 times per day 09/17/14 0154     09/15/14 1000  levofloxacin (LEVAQUIN) tablet 750 mg  Status:  Discontinued     750 mg Oral Daily 09/15/14 0751 09/18/14 1201   09/15/14 0800  metroNIDAZOLE (FLAGYL) tablet 500 mg  Status:  Discontinued     500 mg Oral 3 times per day 09/15/14 0751 09/18/14 1201   09/14/14 1000  metroNIDAZOLE (FLAGYL) IVPB 500 mg  Status:  Discontinued     500 mg 100 mL/hr over 60 Minutes Intravenous Every 8 hours 09/14/14 0845 09/15/14 0751   09/14/14 1000  levofloxacin (LEVAQUIN) IVPB 750 mg  Status:  Discontinued     750 mg 100 mL/hr over 90 Minutes Intravenous Every 24 hours 09/14/14 0847 09/15/14 0751   09/14/14 0900  vancomycin (VANCOCIN) 500 mg in sodium chloride 0.9 % 100 mL IVPB  Status:  Discontinued     500 mg 100 mL/hr over 60 Minutes Intravenous Every  8 hours 09/14/14 0850 09/15/14 1301      Objective: Filed Weights   09/18/14 0400 09/19/14 0500 09/20/14 0500  Weight: 63.8 kg (140 lb 10.5 oz) 63.4 kg (139 lb 12.4 oz) 63.8 kg (140 lb 10.5 oz)    Intake/Output Summary (Last 24 hours) at 09/20/14 1046 Last data filed at 09/20/14 0537  Gross per 24 hour  Intake   1020 ml  Output    950 ml  Net     70 ml     Vitals Filed Vitals:   09/20/14 0600 09/20/14 0759 09/20/14 0831 09/20/14 0834  BP: 135/84     Pulse: 71  75   Temp:  97.8 F (36.6 C)    TempSrc:      Resp: 16  18   Height:      Weight:      SpO2: 99%  99% 98%    Exam:  General:  Pt is alert, , in mild  respiratory distress, on Bipap  HEENT: No icterus, No thrush  Cardiovascular: regular rate and rhythm, S1/S2 No murmur  Respiratory: decreased air entry on the left, rales at left lung base.  Abdomen: Soft, +Bowel sounds, non tender, non distended, no guarding  MSK: No LE edema, cyanosis or clubbing  Data Reviewed: Basic Metabolic Panel:  Recent Labs Lab 09/13/14 1712 09/16/14 0100 09/16/14 1800 09/18/14 0300 09/19/14 0500  NA 136 131* 133* 131* 132*  K 3.9 2.9* 3.5 3.2* 3.7  CL 96 93* 99 92* 94*  CO2 27 31 28 31  32  GLUCOSE 109* 93 106* 131* 138*  BUN 8 8 5* 6 6  CREATININE 0.46* 0.43* 0.33* 0.34* 0.40*  CALCIUM 9.1 7.9* 7.6* 7.8* 8.2*  MG 1.7 1.4*  --   --  1.6  PHOS  --   --   --   --  2.3   Liver Function Tests: No results for input(s): AST, ALT, ALKPHOS, BILITOT, PROT, ALBUMIN in the last 168 hours. No results for input(s): LIPASE, AMYLASE in the last 168 hours. No results for input(s): AMMONIA in the last 168 hours. CBC:  Recent Labs Lab 09/14/14 0330 09/16/14 0100 09/18/14 0300 09/19/14 0500  WBC 6.1 8.0 7.9 14.0*  NEUTROABS  --   --  7.3  --   HGB 10.5* 10.9* 10.3* 10.1*  HCT 32.3* 33.9* 30.3* 30.9*  MCV 87.3 86.3 84.4 84.4  PLT 278 302 249 198   Cardiac Enzymes:  Recent Labs Lab 09/13/14 1712 09/13/14 2259  09/14/14 0330  TROPONINI <0.03 <0.03 <0.03   BNP (last 3 results) No results for input(s): BNP in the last 8760 hours.  ProBNP (last 3 results) No results for input(s): PROBNP in the last 8760 hours.  CBG: No results for input(s): GLUCAP in the last 168 hours.  Recent Results (from the past 240 hour(s))  MRSA PCR Screening     Status: None   Collection Time: 09/12/14 10:27 PM  Result Value Ref Range Status   MRSA by PCR NEGATIVE NEGATIVE Final    Comment:        The GeneXpert MRSA Assay (FDA approved for NASAL specimens only), is one component of a comprehensive MRSA colonization surveillance program. It is not intended to diagnose MRSA infection nor to guide or monitor treatment for MRSA infections.   MRSA PCR Screening     Status: None   Collection Time: 09/18/14  4:00 AM  Result Value Ref Range Status   MRSA by PCR NEGATIVE NEGATIVE Final    Comment:        The GeneXpert MRSA Assay (FDA approved for NASAL specimens only), is one component of a comprehensive MRSA colonization surveillance program. It is not intended to diagnose MRSA infection nor to guide or monitor treatment for MRSA infections.      Studies:  Recent x-ray studies have been reviewed in detail by the Attending Physician  Scheduled Meds:  Scheduled Meds: . acidophilus  1 capsule Oral Daily  . amiodarone  400 mg Oral BID  . antiseptic oral rinse  7 mL Mouth Rinse BID  . antiseptic oral rinse  7 mL Mouth Rinse q12n4p  . ceFEPime (MAXIPIME) IV  1 g Intravenous 3 times per day  . chlorhexidine  15 mL Mouth Rinse BID  . dextromethorphan-guaiFENesin  1 tablet Oral BID  . feeding supplement (ENSURE ENLIVE)  237 mL Oral TID BM  . feeding supplement (RESOURCE BREEZE)  1 Container Oral TID BM  . fentaNYL  100 mcg Transdermal Q72H  . ipratropium  0.5 mg Nebulization Q6H  . levalbuterol  0.63 mg Nebulization 4 times per day  . megestrol  400 mg Oral BID  . methylPREDNISolone (SOLU-MEDROL)  injection  40 mg Intravenous Q12H  . metronidazole  500 mg Intravenous Q8H  . morphine  30 mg Oral Q12H  . nicotine  14 mg Transdermal Daily  . ondansetron  4 mg Oral Q6H   Or  . ondansetron (ZOFRAN) IV  4 mg Intravenous Q6H  . pantoprazole (PROTONIX) IV  40 mg Intravenous Q12H  . polyethylene glycol  17 g Oral Daily  . senna-docusate  1 tablet Oral QHS  . sodium chloride  3 mL Intravenous Q12H  . vancomycin  1,000 mg Intravenous Q8H   Continuous Infusions:    Time spent on care of this patient: 40 min   Jackelin Correia, MD [phone] 09/20/2014, 10:46 AM  LOS: 9 days   [facility] Office  [phone] Pager - Text Page per [email or web address]  If 7PM-7AM, please contact night-coverage [email or web address]

## 2014-09-20 NOTE — Progress Notes (Signed)
ANTIBIOTIC CONSULT NOTE - FOLLOW UP  Pharmacy Consult for Vancomycin, Cefepime Indication: pneumonia  Allergies  Allergen Reactions  . Penicillins Rash    Patient Measurements: Height: 5\' 7"  (170.2 cm) Weight: 140 lb 10.5 oz (63.8 kg) IBW/kg (Calculated) : 61.6  Vital Signs: Temp: 97.8 F (36.6 C) (04/02 0759) Temp Source: Axillary (04/02 0400) BP: 137/94 mmHg (04/02 1100) Pulse Rate: 77 (04/02 1149) Intake/Output from previous day: 04/01 0701 - 04/02 0700 In: 1020 [I.V.:120; IV Piggyback:900] Out: 1600 [Urine:1600] Intake/Output from this shift:    Labs:  Recent Labs  09/18/14 0300 09/19/14 0500 09/20/14 1112  WBC 7.9 14.0* 13.6*  HGB 10.3* 10.1* 10.6*  PLT 249 198 156  CREATININE 0.34* 0.40* 0.41*   Estimated Creatinine Clearance: 84.5 mL/min (by C-G formula based on Cr of 0.41).  Recent Labs  09/18/14 1830 09/20/14 1122  VANCOTROUGH 11.2 17.0     Assessment: 49 yo female with lung cancer previously treated in [place] but now with a mass in her lung and requiring further treatment. Pt with fever and increased cough and overnight may have aspirated on vomitus. Pt started onLevaquin and Flagyl (MD didn't want Clindamycin or Primaxin for anaerobic coverage). Now with increased SOB, CXR showing new multifocal PNA and mild pulmonary edema. Abx changed to Cefepime and Vancomycin per Rx.  3/27 >> Vanco >> 3/28 3/27 >> Levaquin >> 3/31 3/27 >> Flagyl >>  3/30 >> Cefepime >> 3/30 >> Vancomycin >>  Today, 09/20/2014:  Tmax: AF  WBCs: WNL  Renal: SCr low, CrCl >100  VT: 17 4/2: Day #4 Cefepime and Vancomycin, Day#6 metronidazole.  Vancomycin trough level is therapeutic today, no changes to current dosing.  Goal of Therapy:  Vancomycin trough level 15-20 mcg/ml  Appropriate abx dosing, eradication of infection.   Plan:   Continue metronidazole 500mg  IV q8h  Continue cefepime 1g IV q8h  Continue Vancomycin 1g IV q8h.  Recheck Vanc trough as  needed  Follow up renal fxn, culture results, and clinical course.   Gretta Arab PharmD, BCPS Pager [phone] 09/20/2014 12:14 PM

## 2014-09-20 NOTE — Progress Notes (Signed)
PULMONARY / CRITICAL CARE MEDICINE   Name: [name] MRN: [id number] DOB: February 15, 1966    ADMISSION DATE:  09/10/2014 CONSULTATION DATE:  3/31  REFERRING MD :  [facility]  CHIEF COMPLAINT:  Hypoxia  INITIAL PRESENTATION: Chest pain  STUDIES:    SIGNIFICANT EVENTS: 3/30 desaturation  BRIEF  49 yo smoker, dx with  Left Lung cancer (july 2015) underwent chemo and Rtx till 2/16 at which time she moved to [place] from [place] for family support.  She is to establish with local Cancer MD but presents to [facility]  3/23 with chest pain and acute dyspnea. She further developed vomiting along with hematemesis and was evaluated by GI services. Seen on 3/26 by Cardiology service for new onset Afib and placed on IV amiodarone. 3/30 she developed increasing sob , O2 desaturation, and was moved to SDU and PCCM asked to evaluate. She is in venti mask at 50%, desaturates with any activity. CT of chest with left lower fibrothorax and metastases. Alert and interactive, she is a full code. PCCm will evaluate.    SUBJECTIVE:  09/19/14: BiPAP overnight. FAce mask in day. Easy desats but says feeling better. CT yesterday - noPE but has tumor v fibrosis around Left pulm artery LLL fibrothorax and new aspiration pneumonitis.   VITAL SIGNS: Temp:  [97 F (36.1 C)-97.9 F (36.6 C)] 97.8 F (36.6 C) (04/02 0759) Pulse Rate:  [68-87] 79 (04/02 1100) Resp:  [11-25] 15 (04/02 1100) BP: (116-143)/(75-101) 137/94 mmHg (04/02 1100) SpO2:  [83 %-99 %] 99 % (04/02 1100) FiO2 (%):  [80 %-100 %] 80 % (04/02 0834) Weight:  [63.8 kg (140 lb 10.5 oz)] 63.8 kg (140 lb 10.5 oz) (04/02 0500) HEMODYNAMICS:   VENTILATOR SETTINGS: Vent Mode:  [-]  FiO2 (%):  [80 %-100 %] 80 % INTAKE / OUTPUT:  Intake/Output Summary (Last 24 hours) at 09/20/14 1138 Last data filed at 09/20/14 0537  Gross per 24 hour  Intake   1020 ml  Output    950 ml  Net     70 ml    PHYSICAL EXAMINATION: General: thin female Neuro:   Intact HEENT:  No jvd/lan Cardiovascular:  HSR RRR Lungs:  Decreased air movement Abdomen:  Dereased bs Musculoskeletal:  intact Skin:  Warm, tatoos +  LABS:  CBC  Recent Labs Lab 09/18/14 0300 09/19/14 0500 09/20/14 1112  WBC 7.9 14.0* 13.6*  HGB 10.3* 10.1* 10.6*  HCT 30.3* 30.9* 32.9*  PLT 249 198 156   Coag's No results for input(s): APTT, INR in the last 168 hours. BMET  Recent Labs Lab 09/16/14 1800 09/18/14 0300 09/19/14 0500  NA 133* 131* 132*  K 3.5 3.2* 3.7  CL 99 92* 94*  CO2 28 31 32  BUN 5* 6 6  CREATININE 0.33* 0.34* 0.40*  GLUCOSE 106* 131* 138*   Electrolytes  Recent Labs Lab 09/13/14 1712 09/16/14 0100 09/16/14 1800 09/18/14 0300 09/19/14 0500  CALCIUM 9.1 7.9* 7.6* 7.8* 8.2*  MG 1.7 1.4*  --   --  1.6  PHOS  --   --   --   --  2.3   Sepsis Markers  Recent Labs Lab 09/18/14 0300  LATICACIDVEN 1.2   ABG No results for input(s): PHART, PCO2ART, PO2ART in the last 168 hours. Liver Enzymes No results for input(s): AST, ALT, ALKPHOS, BILITOT, ALBUMIN in the last 168 hours. Cardiac Enzymes  Recent Labs Lab 09/13/14 1712 09/13/14 2259 09/14/14 0330  TROPONINI <0.03 <0.03 <0.03   Glucose  No results for input(s): GLUCAP in the last 168 hours.  Imaging Dg Chest Port 1 View  09/19/2014   CLINICAL DATA:  Respiratory failure.  EXAM: PORTABLE CHEST - 1 VIEW  COMPARISON:  Chest CT 12 hr prior  FINDINGS: Tip of the right chest port remains in the proximal SVC. Unchanged elevation of left hemidiaphragm with left pleural effusion. Diffuse parenchymal opacity throughout the right lung, mildly worsened in the right upper lobes. Persistent blunting of right costophrenic angle. Cardiomediastinal contours are unchanged.  IMPRESSION: 1. Diffuse parenchymal opacity throughout the right lung, mild worsening in the right upper lobe. 2. Unchanged appearance of the left hemithorax.   Electronically Signed   By: Jeb Levering M.D.   On: 09/19/2014  00:44    Intake/Output Summary (Last 24 hours) at 09/20/14 1138 Last data filed at 09/20/14 0537  Gross per 24 hour  Intake   1020 ml  Output    950 ml  Net     70 ml    ASSESSMENT / PLAN:  PULMONARY OETT A: Lung cancer unknown type but stage 3 per her hx Complicated L pleural space Acute resp failure Aspiration pna 09/17/14.  - PE ruled out   P:   BiPAP > change to prn, hopefully will not continue to require O2 for pulse ox > 88% Diuresis as she can tolerate BD's I don't see an indication to d/c amiodarone for now   CARDIOVASCULAR CVL potra cath>> A:  A fib RVR   P:  Cards is following No anticoagulation due to GI bleed. xopenex   RENAL  A:   Hypomagnesemia  P:   Follow BMP and replace lytes as indicated  GASTROINTESTINAL A:   GIB P:   PPI GI following  HEMATOLOGIC Anemia of critical illnes P PRBC per ICU guidelines   ONC A:   Lung cancer NOS s/p XRT and chemo in [place], [place]:   Needs hemonc consult, old records   INFECTIOUS A:   Suspected aspiration pna in immunocompromised pt   P:    Abx 3/30 maxipime>> 3/28 levaquin>>3/30 3/28 flagyl>> 3/30 vanco>>  ENDOCRINE A:   DM  P:   SSI  NEUROLOGIC A:   No acute issue P:   RASS goal:0 Alert    FAMILY  - Updates: patient updated  - Inter-disciplinary family meet or Palliative Care meeting due by:  day 7    Baltazar Apo, MD, PhD 09/20/2014, 11:57 AM [facility] [phone] or if no answer [phone]

## 2014-09-21 DIAGNOSIS — R0902 Hypoxemia: Secondary | ICD-10-CM

## 2014-09-21 LAB — CBC
HCT: 33 % — ABNORMAL LOW (ref 36.0–46.0)
HEMOGLOBIN: 10.6 g/dL — AB (ref 12.0–15.0)
MCH: 27.5 pg (ref 26.0–34.0)
MCHC: 32.1 g/dL (ref 30.0–36.0)
MCV: 85.5 fL (ref 78.0–100.0)
PLATELETS: 188 10*3/uL (ref 150–400)
RBC: 3.86 MIL/uL — AB (ref 3.87–5.11)
RDW: 16.3 % — ABNORMAL HIGH (ref 11.5–15.5)
WBC: 13.9 10*3/uL — ABNORMAL HIGH (ref 4.0–10.5)

## 2014-09-21 LAB — BASIC METABOLIC PANEL
Anion gap: 7 (ref 5–15)
BUN: 8 mg/dL (ref 6–23)
CHLORIDE: 100 mmol/L (ref 96–112)
CO2: 29 mmol/L (ref 19–32)
CREATININE: 0.39 mg/dL — AB (ref 0.50–1.10)
Calcium: 8.3 mg/dL — ABNORMAL LOW (ref 8.4–10.5)
GFR calc non Af Amer: >90 mL/min (ref >90)
Glucose, Bld: 108 mg/dL — ABNORMAL HIGH (ref 70–99)
Potassium: 3.6 mmol/L (ref 3.5–5.1)
SODIUM: 136 mmol/L (ref 135–145)

## 2014-09-21 MED ORDER — HYDROMORPHONE HCL 1 MG/ML IJ SOLN
1.0000 mg | INTRAMUSCULAR | Status: DC | PRN
  Administered 2014-09-21 – 2014-09-28 (×45): 1 mg via INTRAVENOUS
  Filled 2014-09-21 (×47): qty 1

## 2014-09-21 MED ORDER — AMIODARONE HCL IN DEXTROSE 360-4.14 MG/200ML-% IV SOLN
60.0000 mg/h | INTRAVENOUS | Status: AC
  Administered 2014-09-21: 60 mg/h via INTRAVENOUS
  Filled 2014-09-21: qty 200

## 2014-09-21 MED ORDER — FUROSEMIDE 10 MG/ML IJ SOLN
60.0000 mg | Freq: Once | INTRAMUSCULAR | Status: AC
  Administered 2014-09-21: 60 mg via INTRAVENOUS
  Filled 2014-09-21: qty 6

## 2014-09-21 MED ORDER — AMIODARONE IV BOLUS ONLY 150 MG/100ML
INTRAVENOUS | Status: AC
  Filled 2014-09-21: qty 100

## 2014-09-21 MED ORDER — AMIODARONE HCL IN DEXTROSE 360-4.14 MG/200ML-% IV SOLN
30.0000 mg/h | INTRAVENOUS | Status: DC
  Administered 2014-09-22 – 2014-09-23 (×3): 30 mg/h via INTRAVENOUS
  Filled 2014-09-21 (×3): qty 200

## 2014-09-21 MED ORDER — AMIODARONE IV BOLUS ONLY 150 MG/100ML
150.0000 mg | Freq: Once | INTRAVENOUS | Status: AC
  Administered 2014-09-21: 150 mg via INTRAVENOUS
  Filled 2014-09-21: qty 100

## 2014-09-21 NOTE — Progress Notes (Signed)
[facility] Progress Note   Keliah Harned AOZ:[id number] DOB: 1965-07-21 DOA: 09/10/2014 PCP: No primary care provider on file.  Brief narrative: [name] is a 49 y.o. female with Lung cancer who was treated for it in [place]. She was told recently after a PET scan that she had a mass in her lung and needed further treatment but she decided to move here with her brother to help care for her. Her records were sent to Dr Julien Nordmann. She presented to the hospital on Tues with ongoing severe left chest pain and was admitted for pain control. 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 As well patient developed A. fib with RVR, due to acute illness required to be on any drip for couple times, back to normal sinus rhythm, not anti-coagulation candidate. Continues to have poor respiratory status giving her pneumonia, or melena service are following  Subjective:. Patient in mild respiratory distress, on BiPAP, reports she feels her breathing is better today .  Assessment/Plan:  acute hypoxic resp failure -  In the setting of poor pulmonary function at baseline secondary to lung cancer, previous lung radiation, and COPD ( no oxygen requirement though), apears to be worsened secondary to HCAP. -  Initially on short course IV steroids given her known history of COPD, discontinued giving no wheezing, continue with nebulizer treatment. - On broad-spectrum antibiotics for aspiration pneumonia. - No further evidence of volume overload. - CT  Chest angiogram negative for PE . - Still requiring BiPAP at bedtime, and 80% rest Ventimask daytime, desaturate with any minimal activity.    Cancer associated pain - she has been on narcotics for months for pain starting at her left lower rib cage and radiating upwards towards axilla- the pain was severe even prior  to coming to [facility] per brother and patient - has a lesion in left post 9th rib which is the only imaging finding that could be casing her pain - much better controlled   HCAP  ( aspiration pneumonia) - may have aspirated on vomitus (hematemesis), started on HCAP coverage (used Vanc/ Levaquin and Flagyl) on 3/27 as symptoms started on her 5th day in the hospital  - Patient had deterioration of respiratory status 3/29, CT chest showing significant progression of her pneumonia, so currently on vancomycin/cefepime/Flagyl - Continue with pulmonary toilet and nebs as needed. - Left lung pleural loculated pleural effusion, discussed with IR, 1 by mouth able to flex his with ultrasound-guided thoracentesis.   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 --ECHO does not reveal underlying cariac issues and I suspected this was all secondary to stress  - she is currently not an anticoagulation candidate due to hematemesis and severe radiation esophagitis, [place] Vasc score of 1.     Lung cancer, primary, with metastasis from lung to other site - was told the she needed further chemo after nodule seen on PET- patient decided to come stay with her brother to continue treatment here with  Dr Julien Nordmann and presented to the hospital the next day due to uncontrolled pain . - CT showing chest wall involvement. -- Appt made for Dr Julien Nordmann for 4/5-     Anorexia-  - started Ensure and Megace-   Ongoing smoking - Nicotine  patch    Code Status: full code, overall prognosis is poor Family Communication: Discussed with daughter over the phone 4/2 Disposition Plan: Remains on step down DVT prophylaxis:  Subcutaneous heparin Consultants: GI, pulmonary , cardiology  Procedures:  1. EGD: very severe esophagitis possibly due to previous radiation combined with reflux  2. ECHO:  Left ventricle: LVEF is grossly normal at approximately 60% Diffiuclt to fully evaluate regional wall motion as endocardium is difficult to see well. The cavity size was normal. Wall thickness was normal. - Pulmonary arteries: PA peak pressure: 34 mm Hg (S).  Antibiotics: Anti-infectives    Start     Dose/Rate Route Frequency Ordered Stop   09/18/14 2000  vancomycin (VANCOCIN) IVPB 1000 mg/200 mL premix     1,000 mg 200 mL/hr over 60 Minutes Intravenous Every 8 hours 09/18/14 1918     09/18/14 1400  metroNIDAZOLE (FLAGYL) IVPB 500 mg     500 mg 100 mL/hr over 60 Minutes Intravenous Every 8 hours 09/18/14 1305     09/17/14 0400  vancomycin (VANCOCIN) IVPB 750 mg/150 ml premix  Status:  Discontinued     750 mg 150 mL/hr over 60 Minutes Intravenous Every 8 hours 09/17/14 0159 09/18/14 1918   09/17/14 0300  ceFEPIme (MAXIPIME) 1 g in dextrose 5 % 50 mL IVPB     1 g 100 mL/hr over 30 Minutes Intravenous 3 times per day 09/17/14 0154     09/15/14 1000  levofloxacin (LEVAQUIN) tablet 750 mg  Status:  Discontinued     750 mg Oral Daily 09/15/14 0751 09/18/14 1201   09/15/14 0800  metroNIDAZOLE (FLAGYL) tablet 500 mg  Status:  Discontinued     500 mg Oral 3 times per day 09/15/14 0751 09/18/14 1201   09/14/14 1000  metroNIDAZOLE (FLAGYL) IVPB 500 mg  Status:  Discontinued     500 mg 100 mL/hr over 60 Minutes Intravenous Every 8 hours 09/14/14 0845 09/15/14 0751   09/14/14 1000  levofloxacin (LEVAQUIN) IVPB 750 mg  Status:  Discontinued     750 mg 100 mL/hr over 90 Minutes Intravenous Every 24 hours 09/14/14 0847 09/15/14 0751    09/14/14 0900  vancomycin (VANCOCIN) 500 mg in sodium chloride 0.9 % 100 mL IVPB  Status:  Discontinued     500 mg 100 mL/hr over 60 Minutes Intravenous Every 8 hours 09/14/14 0850 09/15/14 1301      Objective: Filed Weights   09/19/14 0500 09/20/14 0500 09/21/14 0500  Weight: 63.4 kg (139 lb 12.4 oz) 63.8 kg (140 lb 10.5 oz) 66.3 kg (146 lb 2.6 oz)    Intake/Output Summary (Last 24 hours) at 09/21/14 0958 Last data filed at 09/21/14 0600  Gross per 24 hour  Intake   1670 ml  Output   1100 ml  Net    570 ml     Vitals Filed Vitals:   09/21/14 0600 09/21/14 0800 09/21/14 0904 09/21/14 0914  BP: 125/88     Pulse: 71  76   Temp:  98 F (36.7 C)    TempSrc:      Resp: 11  21   Height:      Weight:      SpO2: 97%  98% 97%  Exam:  General:  Pt is alert, , in mild respiratory distress, on Bipap  HEENT: No icterus, No thrush  Cardiovascular: regular rate and rhythm, S1/S2 No murmur  Respiratory: decreased air entry on the left, rales at left lung base.  Abdomen: Soft, +Bowel sounds, non tender, non distended, no guarding  MSK: No LE edema, cyanosis or clubbing  Data Reviewed: Basic Metabolic Panel:  Recent Labs Lab 09/16/14 0100 09/16/14 1800 09/18/14 0300 09/19/14 0500 09/20/14 1112 09/21/14 0502  NA 131* 133* 131* 132* 136 136  K 2.9* 3.5 3.2* 3.7 4.1 3.6  CL 93* 99 92* 94* 98 100  CO2 31 28 31  32 30 29  GLUCOSE 93 106* 131* 138* 123* 108*  BUN 8 5* 6 6 9 8   CREATININE 0.43* 0.33* 0.34* 0.40* 0.41* 0.39*  CALCIUM 7.9* 7.6* 7.8* 8.2* 8.3* 8.3*  MG 1.4*  --   --  1.6  --   --   PHOS  --   --   --  2.3  --   --    Liver Function Tests: No results for input(s): AST, ALT, ALKPHOS, BILITOT, PROT, ALBUMIN in the last 168 hours. No results for input(s): LIPASE, AMYLASE in the last 168 hours. No results for input(s): AMMONIA in the last 168 hours. CBC:  Recent Labs Lab 09/16/14 0100 09/18/14 0300 09/19/14 0500 09/20/14 1112 09/21/14 0502  WBC  8.0 7.9 14.0* 13.6* 13.9*  NEUTROABS  --  7.3  --   --   --   HGB 10.9* 10.3* 10.1* 10.6* 10.6*  HCT 33.9* 30.3* 30.9* 32.9* 33.0*  MCV 86.3 84.4 84.4 85.7 85.5  PLT 302 249 198 156 188   Cardiac Enzymes: No results for input(s): CKTOTAL, CKMB, CKMBINDEX, TROPONINI in the last 168 hours. BNP (last 3 results) No results for input(s): BNP in the last 8760 hours.  ProBNP (last 3 results) No results for input(s): PROBNP in the last 8760 hours.  CBG: No results for input(s): GLUCAP in the last 168 hours.  Recent Results (from the past 240 hour(s))  MRSA PCR Screening     Status: None   Collection Time: 09/12/14 10:27 PM  Result Value Ref Range Status   MRSA by PCR NEGATIVE NEGATIVE Final    Comment:        The GeneXpert MRSA Assay (FDA approved for NASAL specimens only), is one component of a comprehensive MRSA colonization surveillance program. It is not intended to diagnose MRSA infection nor to guide or monitor treatment for MRSA infections.   MRSA PCR Screening     Status: None   Collection Time: 09/18/14  4:00 AM  Result Value Ref Range Status   MRSA by PCR NEGATIVE NEGATIVE Final    Comment:        The GeneXpert MRSA Assay (FDA approved for NASAL specimens only), is one component of a comprehensive MRSA colonization surveillance program. It is not intended to diagnose MRSA infection nor to guide or monitor treatment for MRSA infections.      Studies:  Recent x-ray studies have been reviewed in detail by the Attending Physician  Scheduled Meds:  Scheduled Meds: . acidophilus  1 capsule Oral Daily  . amiodarone  400 mg Oral BID  . antiseptic oral rinse  7 mL Mouth Rinse BID  . antiseptic oral rinse  7 mL Mouth Rinse q12n4p  . ceFEPime (MAXIPIME) IV  1 g Intravenous 3 times per day  . chlorhexidine  15 mL Mouth Rinse BID  .  dextromethorphan-guaiFENesin  1 tablet Oral BID  . feeding supplement (ENSURE ENLIVE)  237 mL Oral TID BM  . feeding supplement  (RESOURCE BREEZE)  1 Container Oral TID BM  . fentaNYL  100 mcg Transdermal Q72H  . heparin subcutaneous  5,000 Units Subcutaneous 3 times per day  . ipratropium  0.5 mg Nebulization Q6H  . levalbuterol  0.63 mg Nebulization 4 times per day  . megestrol  400 mg Oral BID  . metronidazole  500 mg Intravenous Q8H  . morphine  30 mg Oral Q12H  . nicotine  14 mg Transdermal Daily  . ondansetron  4 mg Oral Q6H   Or  . ondansetron (ZOFRAN) IV  4 mg Intravenous Q6H  . pantoprazole (PROTONIX) IV  40 mg Intravenous Q12H  . polyethylene glycol  17 g Oral Daily  . senna-docusate  1 tablet Oral QHS  . sodium chloride  3 mL Intravenous Q12H  . vancomycin  1,000 mg Intravenous Q8H   Continuous Infusions:    Time spent on care of this patient: 40 min   ELGERGAWY, DAWOOD, MD [phone] 09/21/2014, 9:58 AM  LOS: 10 days   [facility] Office  [phone] Pager - Text Page per [email or web address]  If 7PM-7AM, please contact night-coverage [email or web address]

## 2014-09-21 NOTE — Progress Notes (Addendum)
PULMONARY / CRITICAL CARE MEDICINE   Name: [name] MRN: [id number] DOB: [birth year]    ADMISSION DATE:  09/10/2014 CONSULTATION DATE:  3/31  REFERRING MD :  [facility]  CHIEF COMPLAINT:  Hypoxia  INITIAL PRESENTATION: Chest pain  STUDIES:    SIGNIFICANT EVENTS: 3/30 desaturation  BRIEF  49 yo smoker, dx with  Left Lung cancer (july 2015) underwent chemo and Rtx till 2/16 at which time she moved to [place] from [place] for family support.  She is to establish with local Cancer MD but presents to [facility]  3/23 with chest pain and acute dyspnea. She further developed vomiting along with hematemesis and was evaluated by GI services. Seen on 3/26 by Cardiology service for new onset Afib and placed on IV amiodarone. 3/30 she developed increasing sob , O2 desaturation, and was moved to SDU and PCCM asked to evaluate. She is in venti mask at 50%, desaturates with any activity. CT of chest with left lower fibrothorax and metastases. Alert and interactive, she is a full code. PCCm will evaluate.    SUBJECTIVE:  Wore BiPAp overnight for hypoxemia, still requiring this am when active  VITAL SIGNS: Temp:  [96.9 F (36.1 C)-98.7 F (37.1 C)] 98 F (36.7 C) (04/03 0800) Pulse Rate:  [70-84] 76 (04/03 0904) Resp:  [11-22] 21 (04/03 0904) BP: (117-142)/(78-100) 125/88 mmHg (04/03 0600) SpO2:  [89 %-100 %] 97 % (04/03 0914) FiO2 (%):  [50 %-98 %] 98 % (04/03 1048) Weight:  [66.3 kg (146 lb 2.6 oz)] 66.3 kg (146 lb 2.6 oz) (04/03 0500) HEMODYNAMICS:   VENTILATOR SETTINGS: Vent Mode:  [-]  FiO2 (%):  [50 %-98 %] 98 % INTAKE / OUTPUT:  Intake/Output Summary (Last 24 hours) at 09/21/14 1056 Last data filed at 09/21/14 0600  Gross per 24 hour  Intake   1550 ml  Output   1100 ml  Net    450 ml    PHYSICAL EXAMINATION: General: thin female Neuro:  Intact HEENT:  No jvd/lan Cardiovascular:  HSR RRR Lungs:  Decreased air movement Abdomen:  Dereased bs Musculoskeletal:   intact Skin:  Warm, tatoos +  LABS:  CBC  Recent Labs Lab 09/19/14 0500 09/20/14 1112 09/21/14 0502  WBC 14.0* 13.6* 13.9*  HGB 10.1* 10.6* 10.6*  HCT 30.9* 32.9* 33.0*  PLT 198 156 188   Coag's No results for input(s): APTT, INR in the last 168 hours. BMET  Recent Labs Lab 09/19/14 0500 09/20/14 1112 09/21/14 0502  NA 132* 136 136  K 3.7 4.1 3.6  CL 94* 98 100  CO2 32 30 29  BUN 6 9 8   CREATININE 0.40* 0.41* 0.39*  GLUCOSE 138* 123* 108*   Electrolytes  Recent Labs Lab 09/16/14 0100  09/19/14 0500 09/20/14 1112 09/21/14 0502  CALCIUM 7.9*  < > 8.2* 8.3* 8.3*  MG 1.4*  --  1.6  --   --   PHOS  --   --  2.3  --   --   < > = values in this interval not displayed. Sepsis Markers  Recent Labs Lab 09/18/14 0300  LATICACIDVEN 1.2   ABG No results for input(s): PHART, PCO2ART, PO2ART in the last 168 hours. Liver Enzymes No results for input(s): AST, ALT, ALKPHOS, BILITOT, ALBUMIN in the last 168 hours. Cardiac Enzymes No results for input(s): TROPONINI, PROBNP in the last 168 hours. Glucose No results for input(s): GLUCAP in the last 168 hours.  Imaging Dg Chest 1 View  09/20/2014  CLINICAL DATA:  Short of breath.  Evaluate Port-A-Cath.  EXAM: CHEST  1 VIEW  COMPARISON:  One day prior  FINDINGS: A right Port-A-Cath from an internal jugular approach terminates at the high SVC. The catheter extends superiorly and is angulated in the low neck prior to extending inferiorly. Midline trachea. Mild cardiomegaly. Moderate left pleural effusion and small right pleural effusions are similar. No pneumothorax. Left lower lobe airspace disease is persistent. Patchy interstitial and airspace opacities throughout the right lung are not significantly changed.  IMPRESSION: Right internal jugular Port-A-Cath which is angled in the neck. Consider repositioning.  No significant change in appearance of the chest. Interstitial and airspace disease throughout the right lung which is  suspicious for infection. Asymmetric pulmonary edema less likely.  Left base consolidation adjacent to the left pleural fluid is similar.  Trace right pleural fluid.   Electronically Signed   By: Abigail Miyamoto M.D.   On: 09/20/2014 16:02    Intake/Output Summary (Last 24 hours) at 09/21/14 1056 Last data filed at 09/21/14 0600  Gross per 24 hour  Intake   1550 ml  Output   1100 ml  Net    450 ml    ASSESSMENT / PLAN:  PULMONARY OETT A: Lung cancer unknown type but stage 3 per her hx Complicated L pleural space with segmental collapse in LLL and some focal loculated pockets of pleural effusion.  Acute resp failure Aspiration pna 09/17/14.  - PE ruled out P:   BiPAP > change to prn, hopefully will not continue to require O2 for pulse ox > 88%; try starting high flow O2 device 4/3 Diuresis as she can tolerate > lasix 60 given 4/3 BD's I don't see an indication to d/c amiodarone for now Consider reviewing CT scan with TCTS regarding the loculated pleural disease and any indication for clean-out. Position and size would make IR very difficult   CARDIOVASCULAR CVL potra cath>> A:  A fib RVR  P:  Cards is following No anticoagulation due to GI bleed. xopenex   RENAL  A:   Hypomagnesemia  P:   Follow BMP and replace lytes as indicated  GASTROINTESTINAL A:   GIB P:   PPI GI following  HEMATOLOGIC Anemia of critical illnes P PRBC per ICU guidelines   ONC A:   Lung cancer NOS s/p XRT and chemo in [place], [place]:   Needs hemonc consult, old records   INFECTIOUS A:   Suspected aspiration pna in immunocompromised pt   P:    Abx 3/30 maxipime>> 3/28 levaquin>>3/30 3/28 flagyl>> 3/30 vanco>>  ENDOCRINE A:   DM  P:   SSI  NEUROLOGIC A:   No acute issue P:   RASS goal:0 Alert    FAMILY  - Updates: patient updated  - Inter-disciplinary family meet or Palliative Care meeting due by:  day 7    Baltazar Apo, MD, PhD 09/21/2014, 10:56 AM [facility] [phone] or if no answer [phone]

## 2014-09-21 NOTE — Progress Notes (Signed)
Pt refused cpt at this time.  

## 2014-09-21 NOTE — Progress Notes (Signed)
Patient having trouble breathing requiring Bi-pap unable to take oral medications at this time. Notified Dr. Waldron Labs Will continue to monitor.

## 2014-09-21 NOTE — Progress Notes (Addendum)
A fib loading with amiodarone       Patient Name: [name] Date of Encounter: 09/22/2014    SUBJECTIVE:Still having PAF. No back on IV amio  TELEMETRY:  NSR Filed Vitals:   09/22/14 0200 09/22/14 0300 09/22/14 0400 09/22/14 0500  BP: 112/80 115/81 106/74 110/79  Pulse: 72 72 74 73  Temp:   98.2 F (36.8 C)   TempSrc:   Oral   Resp: 17 14 14 14   Height:      Weight:      SpO2: 98% 97% 97% 97%    Intake/Output Summary (Last 24 hours) at 09/22/14 0619 Last data filed at 09/22/14 0500  Gross per 24 hour  Intake 1762.44 ml  Output   3450 ml  Net -1687.56 ml   LABS: Basic Metabolic Panel:  Recent Labs  09/21/14 0502 09/22/14 0405  NA 136 136  K 3.6 3.4*  CL 100 98  CO2 29 29  GLUCOSE 108* 110*  BUN 8 8  CREATININE 0.39* 0.39*  CALCIUM 8.3* 8.0*   CBC:  Recent Labs  09/21/14 0502 09/22/14 0405  WBC 13.9* 15.3*  HGB 10.6* 10.8*  HCT 33.0* 33.6*  MCV 85.5 84.4  PLT 188 222     Radiology/Studies: CXR with asymmetric edema.  ECHO: normal LV function with mild Pulm Htn  Physical Exam: Blood pressure 110/79, pulse 73, temperature 98.2 F (36.8 C), temperature source Oral, resp. rate 14, height 5\' 7"  (1.702 m), weight 146 lb 2.6 oz (66.3 kg), SpO2 97 %. Weight change:   Wt Readings from Last 3 Encounters:  09/21/14 146 lb 2.6 oz (66.3 kg)  Asleep but arouses slightly during exam   ASSESSMENT:  1. PAF with recurrent episodes driven by stress of respiratory failure. Most recent episode ended at 12:36 am on IV amiodarone. 2. Lung cancer s/p chemo and radiation therapy 3. Respiratory failure  Plan:  1. Continue iv amio for another 48 hours before reverting to amiodarone orally 2.Check BNP. If high, consider diuresis.  Demetrios Isaacs 09/22/2014, 6:19 AM

## 2014-09-21 NOTE — Progress Notes (Signed)
Patient on Aerosol mask at 70% briefly lifted mask to perform oral care oxygen level dropped to 80%.  Mask reapplied and increased FiO2 to 98%. Patient unable to recover oxygen levels remaining in 84-88% Notified RT placed back on BiPAP 50%. Notified Dr. Waldron [facility] of situation . Will contiune to monitor and notify as needed.

## 2014-09-21 NOTE — Progress Notes (Signed)
Pt placed on High Flow Nasal Cannula @ 60% with 12lpm bleed-in. Heat set @ 37 degrees Celcius. Sat's maintaining at 92%. RT will continue to monitor.

## 2014-09-21 NOTE — Progress Notes (Addendum)
Shift Event: Pt back in Afib with RVR, HR in 150s. BP 136/91. On [place] O2 with 94% O2 sats. At bedside, pt is A and O x3, denies any cp, sob, or palpitations. Lungs- CTAB. Heart- Irregular, tachycardic. Abd- sntnd.   Afib with RVR -Pt placed on Amio drip and transitioned to PO amio by Cards. However, RN relates that pt missed morning PO dose of Amio, as she was on BiPAP all day. HR now in 150s. BP 136/91. Will order Amio 150 bolus and will place back on Amio gtt.  RN to let Cardiology know.   [place] [facility]  [phone]

## 2014-09-22 ENCOUNTER — Inpatient Hospital Stay (HOSPITAL_COMMUNITY): Payer: 59

## 2014-09-22 DIAGNOSIS — Z515 Encounter for palliative care: Secondary | ICD-10-CM

## 2014-09-22 DIAGNOSIS — C3490 Malignant neoplasm of unspecified part of unspecified bronchus or lung: Secondary | ICD-10-CM | POA: Insufficient documentation

## 2014-09-22 DIAGNOSIS — B59 Pneumocystosis: Secondary | ICD-10-CM

## 2014-09-22 DIAGNOSIS — R630 Anorexia: Secondary | ICD-10-CM | POA: Insufficient documentation

## 2014-09-22 LAB — BASIC METABOLIC PANEL
ANION GAP: 9 (ref 5–15)
BUN: 8 mg/dL (ref 6–23)
CALCIUM: 8 mg/dL — AB (ref 8.4–10.5)
CHLORIDE: 98 mmol/L (ref 96–112)
CO2: 29 mmol/L (ref 19–32)
CREATININE: 0.39 mg/dL — AB (ref 0.50–1.10)
Glucose, Bld: 110 mg/dL — ABNORMAL HIGH (ref 70–99)
Potassium: 3.4 mmol/L — ABNORMAL LOW (ref 3.5–5.1)
Sodium: 136 mmol/L (ref 135–145)

## 2014-09-22 LAB — CBC
HEMATOCRIT: 33.6 % — AB (ref 36.0–46.0)
HEMOGLOBIN: 10.8 g/dL — AB (ref 12.0–15.0)
MCH: 27.1 pg (ref 26.0–34.0)
MCHC: 32.1 g/dL (ref 30.0–36.0)
MCV: 84.4 fL (ref 78.0–100.0)
Platelets: 222 10*3/uL (ref 150–400)
RBC: 3.98 MIL/uL (ref 3.87–5.11)
RDW: 16.1 % — ABNORMAL HIGH (ref 11.5–15.5)
WBC: 15.3 10*3/uL — ABNORMAL HIGH (ref 4.0–10.5)

## 2014-09-22 LAB — BRAIN NATRIURETIC PEPTIDE: B NATRIURETIC PEPTIDE 5: 301.5 pg/mL — AB (ref 0.0–100.0)

## 2014-09-22 MED ORDER — DEXAMETHASONE SODIUM PHOSPHATE 4 MG/ML IJ SOLN
8.0000 mg | INTRAMUSCULAR | Status: DC
  Administered 2014-09-22 – 2014-09-26 (×5): 8 mg via INTRAVENOUS
  Filled 2014-09-22 (×7): qty 2

## 2014-09-22 MED ORDER — POTASSIUM CHLORIDE CRYS ER 20 MEQ PO TBCR
40.0000 meq | EXTENDED_RELEASE_TABLET | Freq: Once | ORAL | Status: AC
  Administered 2014-09-22: 40 meq via ORAL
  Filled 2014-09-22: qty 2

## 2014-09-22 MED ORDER — FUROSEMIDE 10 MG/ML IJ SOLN
60.0000 mg | Freq: Once | INTRAMUSCULAR | Status: AC
  Administered 2014-09-22: 60 mg via INTRAVENOUS
  Filled 2014-09-22: qty 6

## 2014-09-22 NOTE — Progress Notes (Addendum)
[facility] Progress Note   [name] OEU:[id number] DOB: February 03, 1966 DOA: 09/10/2014 PCP: No primary care provider on file.  Brief narrative: [name] is a 49 y.o. female with Lung cancer who was treated for it in [place]. She was told recently after a PET scan that she had a mass in her lung and needed further treatment but she decided to move here with her brother to help care for her. Her records were sent to Dr Julien Nordmann. She presented to the hospital on Tues with ongoing severe left chest pain and was admitted for pain control. 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 As well patient developed A. fib with RVR, due to acute illness required to be on any drip for couple times, back to normal sinus rhythm, not anti-coagulation candidate. Continues to have poor respiratory status giving her pneumonia, pulmonary service are following  Subjective:. Patient in mild respiratory distress, on BiPAP, reports she feels her breathing is better today .  Assessment/Plan:  acute hypoxic resp failure -  In the setting of poor pulmonary function at baseline secondary to lung cancer, previous lung radiation, and COPD ( no oxygen requirement though), apears to be worsened secondary to HCAP. -  Initially on short course IV steroids given her known history of COPD, discontinued giving no wheezing, continue with nebulizer treatment. - On broad-spectrum antibiotics for aspiration pneumonia. - Initially on IV Lasix for volume overload, currently stopped giving no further evidence of volume overload ( BNP is elevated though) - CT  Chest angiogram negative for PE . - Still requiring BiPAP at bedtime, transitioned from Ventimask to high flow nasal cannula so she can tolerate eating and taking oral medications. - Has loculated pleural effusion, discussed with  IR, unable to drain by ultrasound.    Cancer associated pain - she has been on narcotics for months for pain starting at her left lower rib cage and radiating upwards towards axilla- the pain was severe even prior to coming to [facility] per brother and patient - has a lesion in left post 9th rib which is the only imaging finding that could be casing her pain - much better controlled   HCAP  ( aspiration pneumonia) - may have aspirated on vomitus (hematemesis), started on HCAP coverage (used Vanc/ Levaquin and Flagyl) on 3/27 as symptoms started on her 5th day in the hospital  - Patient had deterioration of respiratory status 3/29, CT chest showing significant progression of her pneumonia, so currently on vancomycin/cefepime/Flagyl - Continue with pulmonary toilet and nebs as needed. - Left lung pleural loculated pleural effusion, discussed with IR, 1 by mouth able to flex his with ultrasound-guided thoracentesis. -- Has loculated pleural effusion, discussed with IR, unable to drain by ultrasound.  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 --ECHO does not reveal underlying cariac issues and I suspected this was all secondary to stress  - she is currently not an anticoagulation  candidate due to hematemesis and severe radiation esophagitis, [place] Vasc score of 1.     Lung cancer, primary, with metastasis from lung to other site - was told the she needed further chemo after  nodule seen on PET- patient decided to come stay with her brother to continue treatment here with Dr Julien Nordmann and presented to the hospital the next day due to uncontrolled pain . - CT showing chest wall involvement -- Appt made for Dr Julien Nordmann for 4/5, discussed case with Dr. Julien Nordmann last week, at this point plan is to stabilize patient respiratory status .  Hypokalemia - Repleted, recheck in a.m.    Anorexia-  - started Ensure and Megace-   Ongoing smoking - Nicotine patch    Code Status: full code, overall prognosis is poor Family Communication: Discussed with brother over the phone 4/2 Disposition Plan: Remains on step down DVT prophylaxis:  Subcutaneous heparin Consultants: GI, pulmonary , cardiology  Procedures:  1. EGD: very severe esophagitis possibly due to previous radiation combined with reflux  2. ECHO:  Left ventricle: LVEF is grossly normal at approximately 60% Diffiuclt to fully evaluate regional wall motion as endocardium is difficult to see well. The cavity size was normal. Wall thickness was normal. - Pulmonary arteries: PA peak pressure: 34 mm Hg (S).  Antibiotics: Anti-infectives    Start     Dose/Rate Route Frequency Ordered Stop   09/18/14 2000  vancomycin (VANCOCIN) IVPB 1000 mg/200 mL premix     1,000 mg 200 mL/hr over 60 Minutes Intravenous Every 8 hours 09/18/14 1918     09/18/14 1400  metroNIDAZOLE (FLAGYL) IVPB 500 mg     500 mg 100 mL/hr over 60 Minutes Intravenous Every 8 hours 09/18/14 1305     09/17/14 0400  vancomycin (VANCOCIN) IVPB 750 mg/150 ml premix  Status:  Discontinued     750 mg 150 mL/hr over 60 Minutes Intravenous Every 8 hours 09/17/14 0159 09/18/14 1918   09/17/14 0300  ceFEPIme (MAXIPIME) 1 g in dextrose 5 % 50 mL IVPB     1 g 100 mL/hr over 30 Minutes Intravenous 3 times per day 09/17/14 0154     09/15/14 1000  levofloxacin (LEVAQUIN) tablet 750 mg  Status:  Discontinued     750 mg Oral Daily 09/15/14 0751 09/18/14  1201   09/15/14 0800  metroNIDAZOLE (FLAGYL) tablet 500 mg  Status:  Discontinued     500 mg Oral 3 times per day 09/15/14 0751 09/18/14 1201   09/14/14 1000  metroNIDAZOLE (FLAGYL) IVPB 500 mg  Status:  Discontinued     500 mg 100 mL/hr over 60 Minutes Intravenous Every 8 hours 09/14/14 0845 09/15/14 0751   09/14/14 1000  levofloxacin (LEVAQUIN) IVPB 750 mg  Status:  Discontinued     750 mg 100 mL/hr over 90 Minutes Intravenous Every 24 hours 09/14/14 0847 09/15/14 0751   09/14/14 0900  vancomycin (VANCOCIN) 500 mg in sodium chloride 0.9 % 100 mL IVPB  Status:  Discontinued     500 mg 100 mL/hr over 60 Minutes Intravenous Every 8 hours 09/14/14 0850 09/15/14 1301      Objective: Filed Weights   09/19/14 0500 09/20/14 0500 09/21/14 0500  Weight: 63.4 kg (139 lb 12.4 oz) 63.8 kg (140 lb 10.5 oz) 66.3 kg (146 lb 2.6 oz)    Intake/Output Summary (Last 24 hours) at 09/22/14 0950 Last data filed at 09/22/14 0800  Gross per 24 hour  Intake 1792.54 ml  Output   3450 ml  Net -1657.46 ml     Vitals Filed Vitals:   09/22/14 0753 09/22/14 0800 09/22/14 0830 09/22/14 0900  BP: 121/87 121/87 134/85 136/84  Pulse: 80 85 78 75  Temp:  98.1 F (36.7 C)    TempSrc:  Oral    Resp: 20 17 22 18   Height:      Weight:      SpO2: 91% 89% 96% 97%    Exam:  General:  Pt is alert, , in mild respiratory distress, on high flow nasal cannula  HEENT: No icterus, No thrush  Cardiovascular: , S1/S2 No murmur  Respiratory: decreased air entry on the left, rales at left lung base.  Abdomen: Soft, +Bowel sounds, non tender, non distended, no guarding  MSK: No LE edema, cyanosis or clubbing  Data Reviewed: Basic Metabolic Panel:  Recent Labs Lab 09/16/14 0100  09/18/14 0300 09/19/14 0500 09/20/14 1112 09/21/14 0502 09/22/14 0405  NA 131*  < > 131* 132* 136 136 136  K 2.9*  < > 3.2* 3.7 4.1 3.6 3.4*  CL 93*  < > 92* 94* 98 100 98  CO2 31  < > 31 32 30 29 29   GLUCOSE 93  < > 131*  138* 123* 108* 110*  BUN 8  < > 6 6 9 8 8   CREATININE 0.43*  < > 0.34* 0.40* 0.41* 0.39* 0.39*  CALCIUM 7.9*  < > 7.8* 8.2* 8.3* 8.3* 8.0*  MG 1.4*  --   --  1.6  --   --   --   PHOS  --   --   --  2.3  --   --   --   < > = values in this interval not displayed. Liver Function Tests: No results for input(s): AST, ALT, ALKPHOS, BILITOT, PROT, ALBUMIN in the last 168 hours. No results for input(s): LIPASE, AMYLASE in the last 168 hours. No results for input(s): AMMONIA in the last 168 hours. CBC:  Recent Labs Lab 09/18/14 0300 09/19/14 0500 09/20/14 1112 09/21/14 0502 09/22/14 0405  WBC 7.9 14.0* 13.6* 13.9* 15.3*  NEUTROABS 7.3  --   --   --   --   HGB 10.3* 10.1* 10.6* 10.6* 10.8*  HCT 30.3* 30.9* 32.9* 33.0* 33.6*  MCV 84.4 84.4 85.7 85.5 84.4  PLT 249 198 156 188 222   Cardiac Enzymes: No results for input(s): CKTOTAL, CKMB, CKMBINDEX, TROPONINI in the last 168 hours. BNP (last 3 results)  Recent Labs  09/22/14 0405  BNP 301.5*    ProBNP (last 3 results) No results for input(s): PROBNP in the last 8760 hours.  CBG: No results for input(s): GLUCAP in the last 168 hours.  Recent Results (from the past 240 hour(s))  MRSA PCR Screening     Status: None   Collection Time: 09/12/14 10:27 PM  Result Value Ref Range Status   MRSA by PCR NEGATIVE NEGATIVE Final    Comment:        The GeneXpert MRSA Assay (FDA approved for NASAL specimens only), is one component of a comprehensive MRSA colonization surveillance program. It is not intended to diagnose MRSA infection nor to guide or monitor treatment for MRSA infections.   MRSA PCR Screening     Status: None   Collection Time: 09/18/14  4:00 AM  Result Value Ref Range Status   MRSA by PCR NEGATIVE NEGATIVE Final    Comment:        The GeneXpert MRSA Assay (FDA approved for NASAL specimens  only), is one component of a comprehensive MRSA colonization surveillance program. It is not intended to diagnose  MRSA infection nor to guide or monitor treatment for MRSA infections.      Studies:  Recent x-ray studies have been reviewed in detail by the Attending Physician  Scheduled Meds:  Scheduled Meds: . acidophilus  1 capsule Oral Daily  . antiseptic oral rinse  7 mL Mouth Rinse BID  . antiseptic oral rinse  7 mL Mouth Rinse q12n4p  . ceFEPime (MAXIPIME) IV  1 g Intravenous 3 times per day  . chlorhexidine  15 mL Mouth Rinse BID  . dextromethorphan-guaiFENesin  1 tablet Oral BID  . feeding supplement (ENSURE ENLIVE)  237 mL Oral TID BM  . feeding supplement (RESOURCE BREEZE)  1 Container Oral TID BM  . fentaNYL  100 mcg Transdermal Q72H  . heparin subcutaneous  5,000 Units Subcutaneous 3 times per day  . ipratropium  0.5 mg Nebulization Q6H  . levalbuterol  0.63 mg Nebulization 4 times per day  . megestrol  400 mg Oral BID  . metronidazole  500 mg Intravenous Q8H  . morphine  30 mg Oral Q12H  . nicotine  14 mg Transdermal Daily  . ondansetron  4 mg Oral Q6H   Or  . ondansetron (ZOFRAN) IV  4 mg Intravenous Q6H  . pantoprazole (PROTONIX) IV  40 mg Intravenous Q12H  . polyethylene glycol  17 g Oral Daily  . senna-docusate  1 tablet Oral QHS  . sodium chloride  3 mL Intravenous Q12H  . vancomycin  1,000 mg Intravenous Q8H   Continuous Infusions: . amiodarone 30 mg/hr (09/22/14 0200)    Time spent on care of this patient: 40 min   [name], [place] 09/22/2014, 9:50 AM  LOS: 11 days   [facility] Office  [phone] Pager - Text Page per [email or web address]  If 7PM-7AM, please contact night-coverage [email or web address]

## 2014-09-22 NOTE — Progress Notes (Addendum)
PULMONARY / CRITICAL CARE MEDICINE   Name: [name] MRN: [id number] DOB: June 26, 1965    ADMISSION DATE:  09/10/2014 CONSULTATION DATE:  3/31  REFERRING MD :  [facility]  CHIEF COMPLAINT:  Hypoxia  INITIAL PRESENTATION: Chest pain  STUDIES:    SIGNIFICANT EVENTS: 3/30 desaturation  BRIEF  49 yo smoker, dx with  Left Lung cancer (july 2015) underwent chemo and Rtx till 2/16 at which time she moved to [place] from [place] for family support.  She is to establish with local Cancer MD but [facility] to [facility]  3[facility] with chest pain and acute dyspnea. She further developed vomiting along with hematemesis and was evaluated by GI services. Seen on 3/26 by Cardiology service for new onset Afib and placed on IV amiodarone. 3/30 she developed increasing sob , O2 desaturation, and was moved to SDU and PCCM asked to evaluate. She is in venti mask at 50%, desaturates with any activity. CT of chest with left lower fibrothorax and metastases. Alert and interactive, she is a full code. PCCM will evaluate.   SUBJECTIVE:  Continues to be hypoxemic, high flow O2 Diuresed   VITAL SIGNS: Temp:  [97.5 F (36.4 C)-98.2 F (36.8 C)] 98.1 F (36.7 C) (04/04 0800) Pulse Rate:  [54-136] 75 (04/04 0900) Resp:  [12-25] 18 (04/04 0900) BP: (95-137)/(63-95) 136/84 mmHg (04/04 0900) SpO2:  [89 %-98 %] 97 % (04/04 0900) FiO2 (%):  [60 %-98 %] 80 % (04/04 0830) HEMODYNAMICS:   VENTILATOR SETTINGS: Vent Mode:  [-]  FiO2 (%):  [60 %-98 %] 80 % INTAKE / OUTPUT:  Intake/Output Summary (Last 24 hours) at 09/22/14 1042 Last data filed at 09/22/14 0800  Gross per 24 hour  Intake 1772.54 ml  Output   3450 ml  Net -1677.46 ml    PHYSICAL EXAMINATION: General: thin female Neuro: Intact HEENT:  No jvd/lan Cardiovascular:  HSR RRR Lungs:  Decreased air movement Abdomen:  Dereased bs Musculoskeletal:  intact Skin:  Warm, tatoos +  LABS:  CBC  Recent Labs Lab 09/20/14 1112 09/21/14 0502  09/22/14 0405  WBC 13.6* 13.9* 15.3*  HGB 10.6* 10.6* 10.8*  HCT 32.9* 33.0* 33.6*  PLT 156 188 222   Coag's No results for input(s): APTT, INR in the last 168 hours. BMET  Recent Labs Lab 09/20/14 1112 09/21/14 0502 09/22/14 0405  NA 136 136 136  K 4.1 3.6 3.4*  CL 98 100 98  CO2 30 29 29   BUN 9 8 8   CREATININE 0.41* 0.39* 0.39*  GLUCOSE 123* 108* 110*   Electrolytes  Recent Labs Lab 09/16/14 0100  09/19/14 0500 09/20/14 1112 09/21/14 0502 09/22/14 0405  CALCIUM 7.9*  < > 8.2* 8.3* 8.3* 8.0*  MG 1.4*  --  1.6  --   --   --   PHOS  --   --  2.3  --   --   --   < > = values in this interval not displayed. Sepsis Markers  Recent Labs Lab 09/18/14 0300  LATICACIDVEN 1.2   ABG No results for input(s): PHART, PCO2ART, PO2ART in the last 168 hours. Liver Enzymes No results for input(s): AST, ALT, ALKPHOS, BILITOT, ALBUMIN in the last 168 hours. Cardiac Enzymes No results for input(s): TROPONINI, PROBNP in the last 168 hours. Glucose No results for input(s): GLUCAP in the last 168 hours.  Imaging No results found.  Intake/Output Summary (Last 24 hours) at 09/22/14 1042 Last data filed at 09/22/14 0800  Gross per 24 hour  Intake 1772.54 ml  Output   3450 ml  Net -1677.46 ml    ASSESSMENT / PLAN:  PULMONARY OETT A: Lung cancer unknown type but stage 3 per her hx Complicated L pleural space with segmental collapse in LLL and some focal loculated pockets of pleural effusion.  Acute resp failure Aspiration pna 09/17/14.  - PE ruled out A fib with suspected acute pulmonary edema P:   BiPAP > change to prn, hopefully will not continue to require O2 for pulse ox > 88%; agree high flow O2 device 4/3 Diuresis as she can tolerate > lasix 60 given 4/3 x 1, repeat 4/4 BD's Consider d/c amiodarone if fails to improve w diuretics and abx; recheck CXR now and in am Consider reviewing CT scan with TCTS regarding the loculated pleural disease and any indication  for clean-out. Position and size  make IR drainage impossible. This may be an issue for the future if we believe that acute decompensation was related to A fib and pulm edema.    CARDIOVASCULAR CVL potra cath>> A:  A fib RVR  P:  Cards is following No anticoagulation due to GI bleed. xopenex   RENAL  A:   Hypomagnesemia  P:   Follow BMP and replace lytes as indicated  GASTROINTESTINAL A:   GIB P:   PPI GI following  HEMATOLOGIC Anemia of critical illnes P PRBC per ICU guidelines   ONC A:   Lung cancer NOS s/p XRT and chemo in [place], [place]:   [place] records should be available through Dr Worthy Flank office. Would obtain these from [facility] so we can review  INFECTIOUS A:   Suspected aspiration pna in immunocompromised pt   P:    Abx 3/30 maxipime>> 3/28 levaquin>>3/30 3/28 flagyl>> 3/30 vanco>>  ENDOCRINE A:   DM  P:   SSI  NEUROLOGIC A:   No acute issue P:   RASS goal:0 Alert    FAMILY  - Updates: patient updated  - Inter-disciplinary family meet or Palliative Care meeting due by:  day 7    Baltazar Apo, MD, PhD 09/22/2014, 10:42 AM [facility] [phone] or if no answer [phone]

## 2014-09-22 NOTE — Progress Notes (Signed)
PT Cancellation Note  Patient Details Name: [name] MRN: [id number] DOB: 09/06/1965   Cancelled Treatment:    Reason Eval/Treat Not Completed: Pain limiting ability to participate. Pain meds requested.    Blondell Reveal Kistler 09/22/2014, 1:43 PM [phone]

## 2014-09-22 NOTE — Care Management Note (Signed)
CARE MANAGEMENT NOTE 09/22/2014  Patient:  [name], [name]   Account Number:  [id number]  Date Initiated:  09/15/2014  Documentation initiated by:  [name],[name]  Subjective/Objective Assessment:   gi bld with chest pain -tropnoin neg/ hgb stable edg severe corrorisive esophagitis with necrosis [place]ley     Action/Plan:   home when stable   Anticipated DC Date:  09/25/2014   Anticipated DC Plan:  HOME/SELF CARE  In-house referral  NA      DC Planning Services  CM consult      PAC Choice  NA   Choice offered to / List presented to:  NA      DME agency  NA        [place] agency  NA   Status of service:  In process, will continue to follow Medicare Important Message given?   (If response is "NO", the following Medicare IM given date fields will be blank) Date Medicare IM given:   Medicare IM given by:   Date Additional Medicare IM given:   Additional Medicare IM given by:    Discharge Disposition:    Per UR Regulation:  Reviewed for med. necessity/level of care/duration of stay  If discussed at [place] of Stay Meetings, dates discussed:    Comments:  [id number]/[name] [name], [name], [name]: [phone] Case management. Chart reviewed for discharge planning and present needs. Discharge needs: none present at time of review. Remaons on bipap with FI02 of 60-98% to maintain 02 level to 92%  September 18, 2014/[name], RN, [name], CCM. Case Management [place] [phone] No discharge needs present of time of review. Patient transferrd down to sdu due to hypoxia and requiring 50-100% o2 via aresol face mask.  High risk of intubation.  September 15, 2014/[name], RN, [name], CCM. Case Management [place] [phone] No discharge needs present of time of review.

## 2014-09-22 NOTE — Progress Notes (Signed)
Pt states she is in a lot of pain righ tnow and wishes to not receive CPT at this time.

## 2014-09-22 NOTE — Consult Note (Signed)
Patient CH:ENIDPO [name]      DOB: [birth year]      EUM:[id number]     Consult Note from the Palliative Medicine Team at [facility] Requested by:  Dr Landis Gandy     PCP: No primary care provider on file. Reason for Consultation: [place]     Phone Number:None  Assessment/Recommendations: 49 yo female with PMHx of Stage IV NSCLC (SCC) s/p chemo/XRT admitted with 3/23 with worsening pleuritic chest pain felt 2/2 malignancy.  Hospital stay complciated by Afib w/RVR, Hypoxic Resp Failure, GI bleed. Palliative consulted for goals of care.    1.  Code Status:  FULL  2. GOC: She would not really engage me much in this conversation today.  She was too uncomfortable.  Hopefully pain control will be a little better tomorrow and I can engage her further.  If not I will try to reach out to her brother if she allows.  Conversations around goals have occurred some with other physicians, and I suspect this will be difficult conversation for her.   3. Symptom Management:   1. Cancer Related Pain: On a lot of long acting pain medicine with fentanyl 169mcg/hr patch and MS contin $RemoveB'30mg'dtgdmUSU$  BID. Her PRN dosing is actually fairly low for this amount of long acting medicine and she was on oxycodone $RemoveBefo'30mg'TXBllvgTjAF$  tabs q8h at home.  Her dilaudid and MSIR doses are much smaller than this but she can obviously get more frequently.  I encouraged her to take MSIR and go ahead and use PRN dialudid if needed in addition.  I will also add decadron to see if this helps with bone met pain (she was on steroids before so results may be limited).  Nursing notes she gets sleepy with dilaudid, so may need to be cautious with adjustments.  Doubt she would tolerate palliative XRT very well right now, and may not be a candidate given her prior radiation treatment.  2. Loss of Appetite: on megace, steroids may help with this as well. Other agents will have little evidence base behind them.   4. Psychosocial/Spiritual: Moved from  [place] to be close to her brother.  No children.     Brief HPI: 49 yo female with PMHx of metastatic NSCLC (SCC) s/p chemo/XRT originally treated in [place], [place].  She moved to [facility] to be with her brother here.  She was admitted on 3/23 with worsenign of her chronic pleuritic chest pain for which she was on Fentanyl patches (45mcg) and PRN oxycodone $RemoveBefor'30mg'OXQVjknColsZ$ .  She describes this pain as a sharp left chest wall pain with radiation in to back. Severe and constantly present. During this hospital stay she had fentanyl patches increased and also started on MS contin. Her hospital stay has been complicated by hematemesis with EGD revealing severe erosive gastritis, Afib w/RVR, Acute hypoxic resp failure requiring PRN BiPAP and now hi flow O2.  She was also noted to have loculated pleural effusion. Her clinical status has been tenuous and palliative has been consulted for goals of care. Currently, she does not say much to me because it hurts her to talk.  She states that dilaudid does not do a great job of controlling pain and does not last very long. She does not tell me a lot about her cancer and difficult to engage in conversation because of discomfort.        PMH:  Past Medical History  Diagnosis Date  . Cancer     left bronchioles  PSH: Past Surgical History  Procedure Laterality Date  . Esophagogastroduodenoscopy N/A 09/14/2014    Procedure: ESOPHAGOGASTRODUODENOSCOPY (EGD);  Surgeon: Dorena Cookey, MD;  Location: [facility];  Service: Endoscopy;  Laterality: N/A;   I have reviewed the FH and SH and  If appropriate update it with new information. Allergies  Allergen Reactions  . Penicillins Rash   Scheduled Meds: . acidophilus  1 capsule Oral Daily  . antiseptic oral rinse  7 mL Mouth Rinse BID  . antiseptic oral rinse  7 mL Mouth Rinse q12n4p  . ceFEPime (MAXIPIME) IV  1 g Intravenous 3 times per day  . chlorhexidine  15 mL Mouth Rinse BID  . dexamethasone  8 mg Intravenous  Q24H  . dextromethorphan-guaiFENesin  1 tablet Oral BID  . feeding supplement (ENSURE ENLIVE)  237 mL Oral TID BM  . feeding supplement (RESOURCE BREEZE)  1 Container Oral TID BM  . fentaNYL  100 mcg Transdermal Q72H  . heparin subcutaneous  5,000 Units Subcutaneous 3 times per day  . ipratropium  0.5 mg Nebulization Q6H  . levalbuterol  0.63 mg Nebulization 4 times per day  . megestrol  400 mg Oral BID  . metronidazole  500 mg Intravenous Q8H  . morphine  30 mg Oral Q12H  . nicotine  14 mg Transdermal Daily  . ondansetron  4 mg Oral Q6H   Or  . ondansetron (ZOFRAN) IV  4 mg Intravenous Q6H  . pantoprazole (PROTONIX) IV  40 mg Intravenous Q12H  . polyethylene glycol  17 g Oral Daily  . senna-docusate  1 tablet Oral QHS  . sodium chloride  3 mL Intravenous Q12H  . vancomycin  1,000 mg Intravenous Q8H   Continuous Infusions: . amiodarone 30 mg/hr (09/22/14 1517)   PRN Meds:.acetaminophen **OR** acetaminophen, albuterol, alum & mag hydroxide-simeth, calcium carbonate, guaiFENesin-dextromethorphan, hydrALAZINE, HYDROmorphone (DILAUDID) injection, lip balm, morphine, promethazine, sodium chloride    BP 113/84 mmHg  Pulse 81  Temp(Src) 97.6 F (36.4 C) (Oral)  Resp 20  Ht 5\' 7"  (1.702 m)  Wt 66.3 kg (146 lb 2.6 oz)  BMI 22.89 kg/m2  SpO2 97%  LMP    PPS: 30   Intake/Output Summary (Last 24 hours) at 09/22/14 1634 Last data filed at 09/22/14 1500  Gross per 24 hour  Intake 1895.74 ml  Output   3900 ml  Net -2004.26 ml    Physical Exam:  General: Alert, appears uncomfortable HEENT:  [place], sclera anciteric Chest:   CTAB CVS: RRR Abdomen: soft, ND Ext: no edema Neuro: appropriately follows commands, moves all ext Skin: scattered tattoos   Labs: CBC    Component Value Date/Time   WBC 15.3* 09/22/2014 0405   RBC 3.98 09/22/2014 0405   HGB 10.8* 09/22/2014 0405   HCT 33.6* 09/22/2014 0405   PLT 222 09/22/2014 0405   MCV 84.4 09/22/2014 0405   MCH 27.1  09/22/2014 0405   MCHC 32.1 09/22/2014 0405   RDW 16.1* 09/22/2014 0405   LYMPHSABS 0.3* 09/18/2014 0300   MONOABS 0.3 09/18/2014 0300   EOSABS 0.0 09/18/2014 0300   BASOSABS 0.0 09/18/2014 0300    BMET    Component Value Date/Time   NA 136 09/22/2014 0405   K 3.4* 09/22/2014 0405   CL 98 09/22/2014 0405   CO2 29 09/22/2014 0405   GLUCOSE 110* 09/22/2014 0405   BUN 8 09/22/2014 0405   CREATININE 0.39* 09/22/2014 0405   CALCIUM 8.0* 09/22/2014 0405   GFRNONAA >90 09/22/2014 0405  GFRAA >90 09/22/2014 0405    CMP     Component Value Date/Time   NA 136 09/22/2014 0405   K 3.4* 09/22/2014 0405   CL 98 09/22/2014 0405   CO2 29 09/22/2014 0405   GLUCOSE 110* 09/22/2014 0405   BUN 8 09/22/2014 0405   CREATININE 0.39* 09/22/2014 0405   CALCIUM 8.0* 09/22/2014 0405   PROT 6.7 09/11/2014 0335   ALBUMIN 3.4* 09/11/2014 0335   AST 12 09/11/2014 0335   ALT 8 09/11/2014 0335   ALKPHOS 86 09/11/2014 0335   BILITOT 0.9 09/11/2014 0335   GFRNONAA >90 09/22/2014 0405   GFRAA >90 09/22/2014 0405   3/31 CTA Chest IMPRESSION: 1. No evidence of acute pulmonary embolus. Tumor versus post treatment fibrosis narrowing the left pulmonary arteries. 2. New severe abnormal pulmonary opacity in both lungs since 09/10/2014 most resembles bilateral pneumonia. Widespread endobronchial spread of tumor also is possible. 3. New bilateral pleural effusions, loculated on the left and associated with pleural and chest wall tumor involvement. Subjacent abnormal enhancement in the spleen is suspicious and suspected to reflect infiltration of either tumor or infection through the diaphragm. 4. Multiple destructive left rib metastases, with pleural and chest wall involvement.   4/4 CXR IMPRESSION: No significant change compared with prior exam.    Total Time: 60 minutes Greater than 50%  of this time was spent counseling and coordinating care related to the above assessment and plan.  Discussed with Dr Lamonte Sakai, Dr Landis Gandy, outside records reveiwed.     Doran Clay D.O. Palliative Medicine Team at [facility]  Pager: [phone] Team Phone: [phone]

## 2014-09-23 ENCOUNTER — Ambulatory Visit: Payer: Self-pay

## 2014-09-23 ENCOUNTER — Ambulatory Visit: Payer: Self-pay | Admitting: Internal Medicine

## 2014-09-23 ENCOUNTER — Inpatient Hospital Stay (HOSPITAL_COMMUNITY): Payer: 59

## 2014-09-23 ENCOUNTER — Other Ambulatory Visit: Payer: Self-pay

## 2014-09-23 LAB — BASIC METABOLIC PANEL
ANION GAP: 9 (ref 5–15)
BUN: 9 mg/dL (ref 6–23)
CO2: 26 mmol/L (ref 19–32)
CREATININE: 0.43 mg/dL — AB (ref 0.50–1.10)
Calcium: 8.5 mg/dL (ref 8.4–10.5)
Chloride: 97 mmol/L (ref 96–112)
GFR calc Af Amer: >90 mL/min (ref >90)
GFR calc non Af Amer: >90 mL/min (ref >90)
Glucose, Bld: 129 mg/dL — ABNORMAL HIGH (ref 70–99)
POTASSIUM: 4.5 mmol/L (ref 3.5–5.1)
SODIUM: 132 mmol/L — AB (ref 135–145)

## 2014-09-23 LAB — CBC
HEMATOCRIT: 35.7 % — AB (ref 36.0–46.0)
HEMOGLOBIN: 11.7 g/dL — AB (ref 12.0–15.0)
MCH: 27.7 pg (ref 26.0–34.0)
MCHC: 32.8 g/dL (ref 30.0–36.0)
MCV: 84.6 fL (ref 78.0–100.0)
Platelets: 331 10*3/uL (ref 150–400)
RBC: 4.22 MIL/uL (ref 3.87–5.11)
RDW: 16.3 % — AB (ref 11.5–15.5)
WBC: 19.1 10*3/uL — ABNORMAL HIGH (ref 4.0–10.5)

## 2014-09-23 LAB — APTT: aPTT: 24 seconds (ref 24–37)

## 2014-09-23 MED ORDER — POTASSIUM CHLORIDE CRYS ER 20 MEQ PO TBCR
40.0000 meq | EXTENDED_RELEASE_TABLET | Freq: Once | ORAL | Status: AC
  Administered 2014-09-23: 40 meq via ORAL
  Filled 2014-09-23: qty 2

## 2014-09-23 MED ORDER — LIDOCAINE HCL 1 % IJ SOLN
INTRAMUSCULAR | Status: AC
  Filled 2014-09-23: qty 20

## 2014-09-23 MED ORDER — LORAZEPAM 2 MG/ML IJ SOLN
0.5000 mg | Freq: Once | INTRAMUSCULAR | Status: AC
  Administered 2014-09-23: 0.5 mg via INTRAVENOUS
  Filled 2014-09-23: qty 1

## 2014-09-23 MED ORDER — FUROSEMIDE 10 MG/ML IJ SOLN
60.0000 mg | Freq: Once | INTRAMUSCULAR | Status: AC
  Administered 2014-09-23: 60 mg via INTRAVENOUS
  Filled 2014-09-23: qty 6

## 2014-09-23 MED ORDER — ZOLPIDEM TARTRATE 5 MG PO TABS
5.0000 mg | ORAL_TABLET | Freq: Once | ORAL | Status: AC
  Administered 2014-09-23: 5 mg via ORAL
  Filled 2014-09-23: qty 1

## 2014-09-23 MED ORDER — HYDROMORPHONE HCL 1 MG/ML IJ SOLN
0.5000 mg | Freq: Once | INTRAMUSCULAR | Status: DC
  Filled 2014-09-23: qty 1

## 2014-09-23 MED ORDER — IOHEXOL 300 MG/ML  SOLN
10.0000 mL | Freq: Once | INTRAMUSCULAR | Status: AC | PRN
  Administered 2014-09-23: 10 mL via INTRAVENOUS

## 2014-09-23 MED ORDER — HYDROMORPHONE HCL 1 MG/ML IJ SOLN
1.0000 mg | Freq: Once | INTRAMUSCULAR | Status: AC
  Administered 2014-09-23: 1 mg via INTRAVENOUS
  Filled 2014-09-23: qty 1

## 2014-09-23 MED ORDER — HEPARIN (PORCINE) IN NACL 100-0.45 UNIT/ML-% IJ SOLN
750.0000 [IU]/h | INTRAMUSCULAR | Status: DC
  Administered 2014-09-23: 750 [IU]/h via INTRAVENOUS
  Filled 2014-09-23: qty 250

## 2014-09-23 NOTE — Progress Notes (Signed)
ANTICOAGULATION CONSULT NOTE - Initial Consult  Pharmacy Consult for Heparin Indication: SVC thrombus  Allergies  Allergen Reactions  . Penicillins Rash    Patient Measurements: Height: 5\' 7"  (170.2 cm) Weight: 140 lb 14 oz (63.9 kg) IBW/kg (Calculated) : 61.6 Heparin Dosing Weight: 63 kg   Vital Signs: Temp: 98.7 F (37.1 C) (04/05 1600) Temp Source: Oral (04/05 1600) BP: 140/89 mmHg (04/05 1900) Pulse Rate: 85 (04/05 1900)  Labs:  Recent Labs  09/21/14 0502 09/22/14 0405 09/23/14 0550 09/23/14 1740  HGB 10.6* 10.8* 11.7*  --   HCT 33.0* 33.6* 35.7*  --   PLT 188 222 331  --   APTT  --   --   --  24  CREATININE 0.39* 0.39* 0.43*  --     Estimated Creatinine Clearance: 84.5 mL/min (by C-G formula based on Cr of 0.43).   Medical History: Past Medical History  Diagnosis Date  . Cancer     left bronchioles    Medications:  Scheduled:  . acidophilus  1 capsule Oral Daily  . antiseptic oral rinse  7 mL Mouth Rinse BID  . antiseptic oral rinse  7 mL Mouth Rinse q12n4p  . ceFEPime (MAXIPIME) IV  1 g Intravenous 3 times per day  . chlorhexidine  15 mL Mouth Rinse BID  . dexamethasone  8 mg Intravenous Q24H  . dextromethorphan-guaiFENesin  1 tablet Oral BID  . feeding supplement (ENSURE ENLIVE)  237 mL Oral TID BM  . feeding supplement (RESOURCE BREEZE)  1 Container Oral TID BM  . fentaNYL  100 mcg Transdermal Q72H  . ipratropium  0.5 mg Nebulization Q6H  . levalbuterol  0.63 mg Nebulization 4 times per day  . lidocaine      . megestrol  400 mg Oral BID  . metronidazole  500 mg Intravenous Q8H  . morphine  30 mg Oral Q12H  . nicotine  14 mg Transdermal Daily  . ondansetron  4 mg Oral Q6H   Or  . ondansetron (ZOFRAN) IV  4 mg Intravenous Q6H  . pantoprazole (PROTONIX) IV  40 mg Intravenous Q12H  . polyethylene glycol  17 g Oral Daily  . senna-docusate  1 tablet Oral QHS  . sodium chloride  3 mL Intravenous Q12H  . vancomycin  1,000 mg Intravenous Q8H    Infusions:    Assessment: 49 yr female admitted on 3/24.  Patient known to pharmacy from dosing of antibiotics.  Of note during this hospitalization, patient with upper GI bleed.  Today porta-cath evaluated via fluoroscopy and showed + SVC thrombosis. Porta-cath removed in radiology today and IV heparin to begin without bolus tonight for treatment of thrombus.  Goal of Therapy:  Heparin Level (0.3-0.5) - aim for low end due to recent GI bleed Monitor platelets by anticoagulation protocol: Yes   Plan:   At 22:00 begin IV heparin gtt @ 750 units/hr  Check heparin level 6 hr after heparin started  Check daily heparin level & CBC  Monitor for signs/symptoms of bleeding  Raima Geathers, Toribio Harbour, PharmD 09/23/2014,7:55 PM

## 2014-09-23 NOTE — Progress Notes (Signed)
PULMONARY / CRITICAL CARE MEDICINE   Name: [name] MRN: [id number] DOB: 1966/03/11    ADMISSION DATE:  09/10/2014 CONSULTATION DATE:  3/31  REFERRING MD :  [facility]  CHIEF COMPLAINT:  Hypoxia  INITIAL PRESENTATION: Chest pain  STUDIES:    SIGNIFICANT EVENTS: 3/30 desaturation  BRIEF  49 yo smoker, dx with  Left Lung cancer (july 2015) underwent chemo and Rtx till 2/16 at which time she moved to [place] from [place] for family support.  She is to establish with local Cancer MD but presents to [facility]  3/23 with chest pain and acute dyspnea. She further developed vomiting along with hematemesis and was evaluated by GI services. Seen on 3/26 by Cardiology service for new onset Afib and placed on IV amiodarone. 3/30 she developed increasing sob , O2 desaturation, and was moved to SDU and PCCM asked to evaluate. She is in venti mask at 50%, desaturates with any activity. CT of chest with left lower fibrothorax and metastases. Alert and interactive, she is a full code. PCCM will evaluate.   SUBJECTIVE:  More comfortable on high flow O2  VITAL SIGNS: Temp:  [97.8 F (36.6 C)-98.3 F (36.8 C)] 97.8 F (36.6 C) (04/05 0820) Pulse Rate:  [70-88] 84 (04/05 1200) Resp:  [12-25] 15 (04/05 1200) BP: (105-145)/(66-93) 105/77 mmHg (04/05 1200) SpO2:  [89 %-100 %] 98 % (04/05 1200) FiO2 (%):  [80 %] 80 % (04/05 1200) Weight:  [63.9 kg (140 lb 14 oz)] 63.9 kg (140 lb 14 oz) (04/05 0500) HEMODYNAMICS:   VENTILATOR SETTINGS: Vent Mode:  [-]  FiO2 (%):  [80 %] 80 % INTAKE / OUTPUT:  Intake/Output Summary (Last 24 hours) at 09/23/14 1252 Last data filed at 09/23/14 0800  Gross per 24 hour  Intake 1267.3 ml  Output   2600 ml  Net -1332.7 ml    PHYSICAL EXAMINATION: General: thin female Neuro: Intact HEENT:  No jvd/lan Cardiovascular:  HSR RRR Lungs:  Decreased air movement Abdomen:  Dereased bs Musculoskeletal:  intact Skin:  Warm, tatoos  +  LABS:  CBC  Recent Labs Lab 09/21/14 0502 09/22/14 0405 09/23/14 0550  WBC 13.9* 15.3* 19.1*  HGB 10.6* 10.8* 11.7*  HCT 33.0* 33.6* 35.7*  PLT 188 222 331   Coag's No results for input(s): APTT, INR in the last 168 hours. BMET  Recent Labs Lab 09/21/14 0502 09/22/14 0405 09/23/14 0550  NA 136 136 132*  K 3.6 3.4* 4.5  CL 100 98 97  CO2 29 29 26   BUN 8 8 9   CREATININE 0.39* 0.39* 0.43*  GLUCOSE 108* 110* 129*   Electrolytes  Recent Labs Lab 09/19/14 0500  09/21/14 0502 09/22/14 0405 09/23/14 0550  CALCIUM 8.2*  < > 8.3* 8.0* 8.5  MG 1.6  --   --   --   --   PHOS 2.3  --   --   --   --   < > = values in this interval not displayed. Sepsis Markers  Recent Labs Lab 09/18/14 0300  LATICACIDVEN 1.2   ABG No results for input(s): PHART, PCO2ART, PO2ART in the last 168 hours. Liver Enzymes No results for input(s): AST, ALT, ALKPHOS, BILITOT, ALBUMIN in the last 168 hours. Cardiac Enzymes No results for input(s): TROPONINI, PROBNP in the last 168 hours. Glucose No results for input(s): GLUCAP in the last 168 hours.  Imaging Dg Chest Port 1 View  09/22/2014   CLINICAL DATA:  Followup pleural effusion and pulmonary infiltrates. Left  lung carcinoma.  EXAM: PORTABLE CHEST - 1 VIEW  COMPARISON:  09/20/2014  FINDINGS: Right-sided power port remains in stable position. A moderate left pleural effusion and left lower lung consolidation or collapse shows no significant change.  Diffuse asymmetric right lung interstitial infiltrates also appears stable, with tiny right pleural effusion which is unchanged. Differential diagnosis includes asymmetric edema, atypical infection, and lymphangitic spread of carcinoma.  IMPRESSION: No significant change compared with prior exam.   Electronically Signed   By: Earle Gell M.D.   On: 09/22/2014 12:33    Intake/Output Summary (Last 24 hours) at 09/23/14 1252 Last data filed at 09/23/14 0800  Gross per 24 hour  Intake 1267.3  ml  Output   2600 ml  Net -1332.7 ml    ASSESSMENT / PLAN:  PULMONARY OETT A: Lung cancer unknown type but stage 3 per her hx, ? NSCLCA Complicated L pleural space with segmental collapse in LLL and some focal loculated pockets of pleural effusion.  Acute resp failure Aspiration pna 09/17/14.  - PE ruled out A fib with suspected acute pulmonary edema P:   BiPAP prn O2 for pulse ox > 88%; agree high flow O2 device Diuresis as she can tolerate > dose lasix daily BD's Will try to d/c amiodarone on 4/5, discussed with Dr Tamala Julian  Consider reviewing CT scan with TCTS regarding the loculated pleural disease and any indication for clean-out. Position and size  make IR drainage impossible. This may be an issue for the future if we believe that acute decompensation was related to A fib and pulm edema.    CARDIOVASCULAR CVL potra cath>> A:  A fib RVR  P:  Cards is following No anticoagulation due to GI bleed. xopenex  Will stop amiodarone 4/5  RENAL  A:   Hypomagnesemia  P:   Follow BMP and replace lytes as indicated  GASTROINTESTINAL A:   GIB P:   PPI GI following  HEMATOLOGIC Anemia of critical illnes P PRBC per ICU guidelines   ONC A:   Lung cancer NOS s/p XRT and chemo in [place], [place]:   [facility] records should be available through Dr Worthy Flank office. Would obtain these from [facility] so we can review  INFECTIOUS A:   Suspected aspiration pna in immunocompromised pt   P:    Abx 3/30 maxipime>> 3/28 levaquin>>3/30 3/28 flagyl>> 3/30 vanco>>  ENDOCRINE A:   DM  P:   SSI  NEUROLOGIC A:   No acute issue P:   RASS goal:0 Alert    FAMILY  - Updates: patient updated  - Inter-disciplinary family meet or Palliative Care meeting due by:  day 7    Baltazar Apo, MD, PhD 09/23/2014, 12:52 PM [facility] [phone] or if no answer [phone]

## 2014-09-23 NOTE — Procedures (Signed)
Successful RT IJ POWER PORT CATHETER INJECTION No comp Stable  Port tip in the proximal SVC with nearly occlusive SVC thrombosis at catheter tip  Findings discussed with Elgergawy

## 2014-09-23 NOTE — Progress Notes (Signed)
Pt asleep, no CPT done.

## 2014-09-23 NOTE — Progress Notes (Signed)
Patient having an anxiety attack  regarding the inflation of bed this happened after a CXR was performed, crying out, stating it is not working right, decreased SPO2, low 80-88  Patient remained high flow n/c. Notified K. Shorr NP new orders noted and received.

## 2014-09-23 NOTE — Progress Notes (Addendum)
[facility] Progress Note   [name] MWN:[id number] DOB: May 05, 1966 DOA: 09/10/2014 PCP: No primary care provider on file.  Brief narrative: [name] is a 49 y.o. female with Lung cancer who was treated for it in [place]. She was told recently after a PET scan that she had a mass in her lung and needed further treatment but she decided to move here with her brother to help care for her. Her records were sent to Dr Julien Nordmann. She presented to the hospital on Tues with ongoing severe left chest pain and was admitted for pain control. 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 As well patient developed A. fib with RVR, due to acute illness required to be on any drip for couple times, back to normal sinus rhythm, not anti-coagulation candidate. Continues to have poor respiratory status giving her pneumonia, pulmonary service are following, patient had Port-A-Cath fluoroscopy to evaluate for malfunction, there was evidence of SVC thrombosis.  Subjective:. Patient in mild respiratory distress, on BiPAP, reports she feels her breathing is better today .  Assessment/Plan:  acute hypoxic resp failure -  In the setting of poor pulmonary function at baseline secondary to lung cancer, previous lung radiation, and COPD ( no oxygen requirement though), apears to be worsened secondary to HCAP. -  Initially on short course IV steroids given her known history of COPD, discontinued giving no wheezing, continue with nebulizer treatment. - On broad-spectrum antibiotics for aspiration pneumonia. - Initially on IV Lasix for volume overload, currently stopped giving no further evidence of volume overload ( BNP is elevated though) - CT  Chest angiogram negative for PE . - Still requiring BiPAP at bedtime, transitioned from Ventimask to high flow nasal cannula so  she can tolerate eating and taking oral medications. - Has loculated pleural effusion, discussed with IR, unable to drain by ultrasound, as well discussed with CT surgery, they don't think she would be a candidate for any intervention at this poit.  SVC thrombosis. - Patient had malfunctioning Port-A-Cath, fluoroscopy showing SVC thrombosis. - Plan is to discontinue Port-A-Cath, giving its source of thrombosis, will insert midline for access for now ( as all other forms of central access will terminate in superior vena cava as well). -Discussed with Dr. Amedeo Plenty regarding anticoagulation, the plan is to start on heparin drip, without bolus, monitor closely for any signs of bleed. - discussed with patient , unfortunately either decision has risk, to anticoagulate vs no intervention caries significant risk, and plan was for anticoagulation.   Cancer associated pain - she has been on narcotics for months for pain starting at her left lower rib cage and radiating upwards towards axilla- the pain was severe even prior to coming to [facility] per brother and patient - has a lesion in left post 9th rib which is the only imaging finding that could be casing her pain - much better controlled , palliative care input appreciated. - started on decadron to help with chest wall metastasis.  HCAP  ( aspiration pneumonia) - may have aspirated on vomitus (hematemesis), started on HCAP coverage (used Vanc/ Levaquin and Flagyl) on 3/27 as symptoms started on her 5th day in the hospital  - Patient had deterioration of respiratory status 3/29, CT chest showing significant progression of her pneumonia, so currently on vancomycin/cefepime/Flagyl - Continue with pulmonary toilet and nebs as needed. 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 --ECHO does not reveal underlying cariac issues and I suspected this was all secondary to stress  - she is currently not an anticoagulation candidate due to hematemesis and severe radiation esophagitis, [place] Vasc score of 1.     Lung cancer, primary, 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 - CT showing chest wall involvement -- Appt made for Dr Julien Nordmann for 4/5, discussed case with Dr. Julien Nordmann last week, at this point plan is to stabilize patient respiratory status .  Hypokalemia - Repleted, recheck in a.m.    Anorexia-  - started Ensure and Megace-   Ongoing smoking - Nicotine patch    Code Status: full code, prognosis is very poor, Family Communication: discussed with brother over the phone 4/5 Disposition Plan: Remains on step down DVT prophylaxis:  Will transition to heparin drip Consultants: GI, pulmonary , cardiology  Procedures:  1. EGD: very severe esophagitis possibly due to previous radiation combined with reflux  2. ECHO:  Left ventricle: LVEF is grossly normal at approximately 60% Diffiuclt to fully evaluate regional wall motion as endocardium is difficult to see well. The cavity size was normal. Wall thickness was normal. - Pulmonary arteries: PA peak pressure: 34 mm Hg (S).  Antibiotics: Anti-infectives    Start     Dose/Rate Route Frequency  Ordered Stop   09/18/14 2000  vancomycin (VANCOCIN) IVPB 1000 mg/200 mL premix     1,000 mg 200 mL/hr over 60 Minutes Intravenous Every 8 hours 09/18/14 1918     09/18/14 1400  metroNIDAZOLE (FLAGYL) IVPB 500 mg     500 mg 100 mL/hr over 60 Minutes Intravenous Every 8 hours 09/18/14 1305     09/17/14 0400  vancomycin (VANCOCIN) IVPB 750 mg/150 ml premix  Status:  Discontinued     750 mg 150 mL/hr over 60 Minutes Intravenous Every 8 hours 09/17/14 0159 09/18/14 1918   09/17/14 0300  ceFEPIme (MAXIPIME) 1 g in dextrose 5 % 50 mL IVPB     1 g 100 mL/hr over 30 Minutes Intravenous 3 times per day 09/17/14 0154     09/15/14 1000  levofloxacin (LEVAQUIN) tablet 750 mg  Status:  Discontinued     750 mg Oral Daily 09/15/14 0751 09/18/14 1201   09/15/14 0800  metroNIDAZOLE (FLAGYL) tablet 500 mg  Status:  Discontinued     500 mg Oral 3 times per day 09/15/14 0751 09/18/14 1201   09/14/14 1000  metroNIDAZOLE (FLAGYL) IVPB 500 mg  Status:  Discontinued     500 mg 100 mL/hr over 60 Minutes Intravenous Every 8 hours 09/14/14 0845 09/15/14 0751   09/14/14 1000  levofloxacin (LEVAQUIN) IVPB 750 mg  Status:  Discontinued     750 mg 100 mL/hr over 90  Minutes Intravenous Every 24 hours 09/14/14 0847 09/15/14 0751   09/14/14 0900  vancomycin (VANCOCIN) 500 mg in sodium chloride 0.9 % 100 mL IVPB  Status:  Discontinued     500 mg 100 mL/hr over 60 Minutes Intravenous Every 8 hours 09/14/14 0850 09/15/14 1301      Objective: Filed Weights   09/20/14 0500 09/21/14 0500 09/23/14 0500  Weight: 63.8 kg (140 lb 10.5 oz) 66.3 kg (146 lb 2.6 oz) 63.9 kg (140 lb 14 oz)    Intake/Output Summary (Last 24 hours) at 09/23/14 1243 Last data filed at 09/23/14 0800  Gross per 24 hour  Intake 1267.3 ml  Output   2600 ml  Net -1332.7 ml     Vitals Filed Vitals:   09/23/14 0900 09/23/14 1000 09/23/14 1100 09/23/14 1200  BP: 115/79 133/84 124/85 105/77  Pulse: 74 88 83 84  Temp:      TempSrc:      Resp: 13  24 17 15   Height:      Weight:      SpO2: 93% 95% 100% 98%    Exam:  General:  Pt is alert, , in mild respiratory distress, on high flow nasal cannula  HEENT: No icterus, No thrush  Cardiovascular: , S1/S2 No murmur  Respiratory: decreased air entry on the left, rales at left lung base.  Abdomen: Soft, +Bowel sounds, non tender, non distended, no guarding  MSK: No LE edema, cyanosis or clubbing  Data Reviewed: Basic Metabolic Panel:  Recent Labs Lab 09/19/14 0500 09/20/14 1112 09/21/14 0502 09/22/14 0405 09/23/14 0550  NA 132* 136 136 136 132*  K 3.7 4.1 3.6 3.4* 4.5  CL 94* 98 100 98 97  CO2 32 30 29 29 26   GLUCOSE 138* 123* 108* 110* 129*  BUN 6 9 8 8 9   CREATININE 0.40* 0.41* 0.39* 0.39* 0.43*  CALCIUM 8.2* 8.3* 8.3* 8.0* 8.5  MG 1.6  --   --   --   --   PHOS 2.3  --   --   --   --    Liver Function Tests: No results for input(s): AST, ALT, ALKPHOS, BILITOT, PROT, ALBUMIN in the last 168 hours. No results for input(s): LIPASE, AMYLASE in the last 168 hours. No results for input(s): AMMONIA in the last 168 hours. CBC:  Recent Labs Lab 09/18/14 0300 09/19/14 0500 09/20/14 1112 09/21/14 0502 09/22/14 0405 09/23/14 0550  WBC 7.9 14.0* 13.6* 13.9* 15.3* 19.1*  NEUTROABS 7.3  --   --   --   --   --   HGB 10.3* 10.1* 10.6* 10.6* 10.8* 11.7*  HCT 30.3* 30.9* 32.9* 33.0* 33.6* 35.7*  MCV 84.4 84.4 85.7 85.5 84.4 84.6  PLT 249 198 156 188 222 331   Cardiac Enzymes: No results for input(s): CKTOTAL, CKMB, CKMBINDEX, TROPONINI in the last 168 hours. BNP (last 3 results)  Recent Labs  09/22/14 0405  BNP 301.5*    ProBNP (last 3 results) No results for input(s): PROBNP in the last 8760 hours.  CBG: No results for input(s): GLUCAP in the last 168 hours.  Recent Results (from the past 240 hour(s))  MRSA PCR Screening     Status: None   Collection Time: 09/18/14  4:00 AM  Result Value Ref Range Status   MRSA by PCR NEGATIVE NEGATIVE Final     Comment:        The GeneXpert MRSA Assay (FDA approved for NASAL specimens only), is one component of a comprehensive  MRSA colonization surveillance program. It is not intended to diagnose MRSA infection nor to guide or monitor treatment for MRSA infections.      Studies:  Recent x-ray studies have been reviewed in detail by the Attending Physician  Scheduled Meds:  Scheduled Meds: . acidophilus  1 capsule Oral Daily  . antiseptic oral rinse  7 mL Mouth Rinse BID  . antiseptic oral rinse  7 mL Mouth Rinse q12n4p  . ceFEPime (MAXIPIME) IV  1 g Intravenous 3 times per day  . chlorhexidine  15 mL Mouth Rinse BID  . dexamethasone  8 mg Intravenous Q24H  . dextromethorphan-guaiFENesin  1 tablet Oral BID  . feeding supplement (ENSURE ENLIVE)  237 mL Oral TID BM  . feeding supplement (RESOURCE BREEZE)  1 Container Oral TID BM  . fentaNYL  100 mcg Transdermal Q72H  . heparin subcutaneous  5,000 Units Subcutaneous 3 times per day  . ipratropium  0.5 mg Nebulization Q6H  . levalbuterol  0.63 mg Nebulization 4 times per day  . megestrol  400 mg Oral BID  . metronidazole  500 mg Intravenous Q8H  . morphine  30 mg Oral Q12H  . nicotine  14 mg Transdermal Daily  . ondansetron  4 mg Oral Q6H   Or  . ondansetron (ZOFRAN) IV  4 mg Intravenous Q6H  . pantoprazole (PROTONIX) IV  40 mg Intravenous Q12H  . polyethylene glycol  17 g Oral Daily  . senna-docusate  1 tablet Oral QHS  . sodium chloride  3 mL Intravenous Q12H  . vancomycin  1,000 mg Intravenous Q8H   Continuous Infusions:    Time spent on care of this patient: 40 min   Kalise Fickett, MD [phone] 09/23/2014, 12:43 PM  LOS: 12 days   [facility] Office  [phone] Pager - Text Page per [email or web address]  If 7PM-7AM, please contact night-coverage [email or web address]

## 2014-09-23 NOTE — Progress Notes (Signed)
PT Cancellation Note  Patient Details Name: [name] MRN: [id number] DOB: 1965/07/06   Cancelled Treatment:    Reason Eval/Treat Not Completed: Pain limiting ability to participate. Pt stated she's in pain at rest despite pain medication. Will follow.    Blondell Reveal Kistler 09/23/2014, 11:33 AM  [phone]

## 2014-09-23 NOTE — Procedures (Signed)
Successful RT IJ PORT REMOVAL Successful RUE DL POWER PICC(9CM LENGTH) TO SERVE AS PERIPHERAL IV ACCESS ONLY  NO COMP STABLE

## 2014-09-23 NOTE — Progress Notes (Signed)
Paged Dr. Waldron Labs about pain med increase requested by patient.

## 2014-09-23 NOTE — Progress Notes (Addendum)
       Patient Name: Netanya Yazdani Date of Encounter: 09/23/2014    SUBJECTIVE:Breathing improved but still on high flow O2.  TELEMETRY:  NSR for past 24 hours  Filed Vitals:   09/23/14 0700 09/23/14 0800 09/23/14 0820 09/23/14 0900  BP: 114/66 114/77  115/79  Pulse: 76 79  74  Temp:   97.8 F (36.6 C)   TempSrc:   Oral   Resp: 15 14  13   Height:      Weight:      SpO2: 94% 94%  93%    Intake/Output Summary (Last 24 hours) at 09/23/14 0946 Last data filed at 09/23/14 0800  Gross per 24 hour  Intake 1503.7 ml  Output   2600 ml  Net -1096.3 ml   LABS: Basic Metabolic Panel:  Recent Labs  09/22/14 0405 09/23/14 0550  NA 136 132*  K 3.4* 4.5  CL 98 97  CO2 29 26  GLUCOSE 110* 129*  BUN 8 9  CREATININE 0.39* 0.43*  CALCIUM 8.0* 8.5   CBC:  Recent Labs  09/22/14 0405 09/23/14 0550  WBC 15.3* 19.1*  HGB 10.8* 11.7*  HCT 33.6* 35.7*  MCV 84.4 84.6  PLT 222 331   Radiology/Studies:  CXR with right and left lung infiltrate and L pleural effusion  Physical Exam: Blood pressure 115/79, pulse 74, temperature 97.8 F (36.6 C), temperature source Oral, resp. rate 13, height 5\' 7"  (1.702 m), weight 140 lb 14 oz (63.9 kg), SpO2 93 %. Weight change:   Wt Readings from Last 3 Encounters:  09/23/14 140 lb 14 oz (63.9 kg)    No wheezes No gallop or rub.  ASSESSMENT:  1. Atrial fib reverted to NSR and patient is feeling better. No clinical evidence of volume overload. 2. Respiratory status is improved, but still requiring high flow O2.  Plan:  1. After discussion with Dr. Lamonte Sakai about respiratory status, we will d/c amio for fear of toxicity in setting of persistent hypoxemia, to avoid amio lung toxicity 2. Monitor course and re-involve if recurrent AF  Signed, Sinclair Grooms 09/23/2014, 9:46 AM

## 2014-09-23 NOTE — Progress Notes (Signed)
Patient YK:DXIPJA [name]      DOB: [birth year]      SNK:[id number]   Palliative Medicine Team at [facility] Progress Note    Subjective: Pain doing better today. Also less dyspnea.  Had trouble sleeping last night. Anxiety issues noted by nursing.  Appetite feels a little better today but still has not ate much yet.     Filed Vitals:   09/23/14 0820  BP:   Pulse:   Temp: 97.8 F (36.6 C)  Resp:    Physical exam: General: Alert, appears uncomfortable  HEENT: [place], sclera anciteric  Chest: CTAB  CVS: RRR  Abdomen: soft, ND  Ext: no edema  CBC    Component Value Date/Time   WBC 19.1* 09/23/2014 0550   RBC 4.22 09/23/2014 0550   HGB 11.7* 09/23/2014 0550   HCT 35.7* 09/23/2014 0550   PLT 331 09/23/2014 0550   MCV 84.6 09/23/2014 0550   MCH 27.7 09/23/2014 0550   MCHC 32.8 09/23/2014 0550   RDW 16.3* 09/23/2014 0550   LYMPHSABS 0.3* 09/18/2014 0300   MONOABS 0.3 09/18/2014 0300   EOSABS 0.0 09/18/2014 0300   BASOSABS 0.0 09/18/2014 0300    CMP     Component Value Date/Time   NA 132* 09/23/2014 0550   K 4.5 09/23/2014 0550   CL 97 09/23/2014 0550   CO2 26 09/23/2014 0550   GLUCOSE 129* 09/23/2014 0550   BUN 9 09/23/2014 0550   CREATININE 0.43* 09/23/2014 0550   CALCIUM 8.5 09/23/2014 0550   PROT 6.7 09/11/2014 0335   ALBUMIN 3.4* 09/11/2014 0335   AST 12 09/11/2014 0335   ALT 8 09/11/2014 0335   ALKPHOS 86 09/11/2014 0335   BILITOT 0.9 09/11/2014 0335   GFRNONAA >90 09/23/2014 0550   GFRAA >90 09/23/2014 0550      Assessment and plan: 49 yo female with PMHx of Stage IV NSCLC (SCC) s/p chemo/XRT admitted with 3/23 with worsening pleuritic chest pain felt 2/2 malignancy. Hospital stay complciated by Afib w/RVR, Hypoxic Resp Failure, GI bleed. Palliative consulted for goals of care.   1. Code Status: FULL  2. GOC: Feeling a little better today and able to talk with me a little more.  Last conversation with her Oncologist, she was told that her  cancer had progressed and that she needed more chemotherapy.  She has not seen Dr Earlie Server here yet because she was only in [place] 2 days prior to admission. She is much more positive today because she is feeling better.  [name] is hopeful of continued trajectory of improvement so she can go home soon.  We talked a little about the challenges in her O2 requirements and things being done here to try and improve her symptoms. If not improving with current interventions, this would certainly be worrisome for malignancy being main driver of her hypoxia. I spoke with her brother [name] today over the phone about these issues as well. Has been in hospital 12 days, so I think the next few days will be important for her prognostically.    3. Symptom Management:  1. Cancer Related Pain: Doing significantly better today.  I am not sure steroids and MSIR was main driver for this, or ongoing diuresis and other management.  Would continue current regimen and continue to monitor.  Loss of Appetite: on megace, steroids.   4. Psychosocial/Spiritual: Moved from [place] to be close to her brother [name]. Has another brother in [place], [place] but she felt [name] would better be able  to care for her. No children.    Total Time: 30 minutes >50% of time spent in counseling and coordination of care regarding above.    Doran Clay D.O. Palliative Medicine Team at [facility]  Pager: [phone] Team Phone: [phone]

## 2014-09-23 NOTE — Progress Notes (Signed)
ANTIBIOTIC CONSULT NOTE - FOLLOW UP  Pharmacy Consult for Vancomycin, Cefepime Indication: pneumonia  Allergies  Allergen Reactions  . Penicillins Rash    Patient Measurements: Height: 5\' 7"  (170.2 cm) Weight: 140 lb 14 oz (63.9 kg) IBW/kg (Calculated) : 61.6  Vital Signs: Temp: 97.8 F (36.6 C) (04/05 0820) Temp Source: Oral (04/05 0820) BP: 115/79 mmHg (04/05 0900) Pulse Rate: 74 (04/05 0900) Intake/Output from previous day: 04/04 0701 - 04/05 0700 In: 936.7 [P.O.:100; I.V.:186.7; IV Piggyback:650] Out: 2600 [Urine:2600] Intake/Output from this shift: Total I/O In: 600.4 [I.V.:200.4; IV Piggyback:400] Out: -   Labs:  Recent Labs  09/21/14 0502 09/22/14 0405 09/23/14 0550  WBC 13.9* 15.3* 19.1*  HGB 10.6* 10.8* 11.7*  PLT 188 222 331  CREATININE 0.39* 0.39* 0.43*   Estimated Creatinine Clearance: 84.5 mL/min (by C-G formula based on Cr of 0.43).  Recent Labs  09/20/14 1122  VANCOTROUGH 17.0     Assessment: 40 YOF presented 09/11/14 due to left-sided chest pain. History of lung cancer previously treated in [place] with chemoradiation (last 07/2014), now with a mass in her lung and requiring further treatment. Of note patient does have chronic, recurrent chest pain related to lung cancer. Patient has had several recent hospital admission, last a few weeks ago. Patient was admitted 09/11/14 for pain control.   On 09/12/14 had a fever, increased cough and hematemesis due to radiation esophagitis with concern of aspirated vomitus. Patient was started onlevofloxacin and metronidazole (MD did not want Clindamycin or Primaxin for anaerobic coverage). Patient developed increased shortness of breath and CXR showed new multifocal PNA and mild pulmonary edema. Antibiotics were changed to Cefepime and vancomycin per pharmacy and metronidazole was continued.   3/27 vancomycin>>3/28, restarted 3/30 3/27 levofloxacin>> 3/31 3/27 metronidazole>>  3/30 cefepime >>  Dose  changes/drug level info: 3/31 VT at 1830 = 11.2 on 750mg  q8h (drawn 6.5hr after dose) - increase 1g q8h 4/1 1100 VT = 17 on 1g q8h (prior to 6th dose) - continue dose  Today, 09/23/2014: Day 7 of Cefepime and Vancomycin and  Day 10 of metronidazole. No change in renal function today, continue antibiotic doses.  Today, 09/23/2014:  Tmax: Afeb x 48hrs  WBCs: elevated and increasing at 19.1  Renal: SCr 0.43, CrCl ~38mL/min (normalized >155mL/min)  09/20/14 VT: 17  Goal of Therapy:  Vancomycin trough level 15-20 mcg/ml  Appropriate abx dosing, eradication of infection.   Plan:  Continue metronidazole 500mg  IV q8h Continue cefepime 1g IV q8h Continue vancomycin 1g IV q8h. Recheck vancomycin trough as needed Follow up renal function, culture results, and clinical course/improvement.  Gloriajean Dell, PharmD Candidate     Addendum: I agree with the above assessment and plan. Will repeat a VT tomorrow if therapy is not narrowed considering accumulation could occur with q8h dosing.  Hershal Coria, PharmD, BCPS Pager: [phone] 09/23/2014 12:56 PM

## 2014-09-24 ENCOUNTER — Inpatient Hospital Stay (HOSPITAL_COMMUNITY): Payer: 59

## 2014-09-24 DIAGNOSIS — G9340 Encephalopathy, unspecified: Secondary | ICD-10-CM | POA: Insufficient documentation

## 2014-09-24 LAB — CBC
HCT: 35 % — ABNORMAL LOW (ref 36.0–46.0)
Hemoglobin: 11.4 g/dL — ABNORMAL LOW (ref 12.0–15.0)
MCH: 27.7 pg (ref 26.0–34.0)
MCHC: 32.6 g/dL (ref 30.0–36.0)
MCV: 85.2 fL (ref 78.0–100.0)
Platelets: 377 10*3/uL (ref 150–400)
RBC: 4.11 MIL/uL (ref 3.87–5.11)
RDW: 16.6 % — ABNORMAL HIGH (ref 11.5–15.5)
WBC: 15.6 10*3/uL — ABNORMAL HIGH (ref 4.0–10.5)

## 2014-09-24 LAB — BASIC METABOLIC PANEL
ANION GAP: 5 (ref 5–15)
BUN: 12 mg/dL (ref 6–23)
CALCIUM: 8.5 mg/dL (ref 8.4–10.5)
CHLORIDE: 96 mmol/L (ref 96–112)
CO2: 29 mmol/L (ref 19–32)
Creatinine, Ser: 0.57 mg/dL (ref 0.50–1.10)
GFR calc Af Amer: >90 mL/min (ref >90)
GFR calc non Af Amer: >90 mL/min (ref >90)
Glucose, Bld: 205 mg/dL — ABNORMAL HIGH (ref 70–99)
Potassium: 4.9 mmol/L (ref 3.5–5.1)
Sodium: 130 mmol/L — ABNORMAL LOW (ref 135–145)

## 2014-09-24 LAB — HEPARIN LEVEL (UNFRACTIONATED)
HEPARIN UNFRACTIONATED: 0.19 [IU]/mL — AB (ref 0.30–0.70)
Heparin Unfractionated: <0.1 IU/mL — ABNORMAL LOW (ref 0.30–0.70)
Heparin Unfractionated: 0.22 IU/mL — ABNORMAL LOW (ref 0.30–0.70)

## 2014-09-24 MED ORDER — HEPARIN (PORCINE) IN NACL 100-0.45 UNIT/ML-% IJ SOLN
1350.0000 [IU]/h | INTRAMUSCULAR | Status: DC
  Administered 2014-09-24: 1350 [IU]/h via INTRAVENOUS
  Filled 2014-09-24: qty 250

## 2014-09-24 MED ORDER — HEPARIN (PORCINE) IN NACL 100-0.45 UNIT/ML-% IJ SOLN
1200.0000 [IU]/h | INTRAMUSCULAR | Status: DC
  Filled 2014-09-24: qty 250

## 2014-09-24 MED ORDER — HEPARIN (PORCINE) IN NACL 100-0.45 UNIT/ML-% IJ SOLN
1000.0000 [IU]/h | INTRAMUSCULAR | Status: DC
  Filled 2014-09-24: qty 250

## 2014-09-24 NOTE — Progress Notes (Signed)
Patient BS:[id number] [name]      DOB: 03/24/1966      FMB:[id number]   Palliative Medicine Team at [facility] Progress Note    Subjective: Reports pain better controlled. Slept better last night.  Appetite still poor.  Feels like overall things are getting better.    Filed Vitals:   09/24/14 0800  BP: 140/94  Pulse: 83  Temp: 97.6 F (36.4 C)  Resp: 20   Physical exam: GEN: alert, NAD CV: RRR LUNGS: CTAB ABD: soft, NT, ND EXT: warm, no edema  CBC    Component Value Date/Time   WBC 15.6* 09/24/2014 0550   RBC 4.11 09/24/2014 0550   HGB 11.4* 09/24/2014 0550   HCT 35.0* 09/24/2014 0550   PLT 377 09/24/2014 0550   MCV 85.2 09/24/2014 0550   MCH 27.7 09/24/2014 0550   MCHC 32.6 09/24/2014 0550   RDW 16.6* 09/24/2014 0550   LYMPHSABS 0.3* 09/18/2014 0300   MONOABS 0.3 09/18/2014 0300   EOSABS 0.0 09/18/2014 0300   BASOSABS 0.0 09/18/2014 0300    CMP     Component Value Date/Time   NA 130* 09/24/2014 0550   K 4.9 09/24/2014 0550   CL 96 09/24/2014 0550   CO2 29 09/24/2014 0550   GLUCOSE 205* 09/24/2014 0550   BUN 12 09/24/2014 0550   CREATININE 0.57 09/24/2014 0550   CALCIUM 8.5 09/24/2014 0550   PROT 6.7 09/11/2014 0335   ALBUMIN 3.4* 09/11/2014 0335   AST 12 09/11/2014 0335   ALT 8 09/11/2014 0335   ALKPHOS 86 09/11/2014 0335   BILITOT 0.9 09/11/2014 0335   GFRNONAA >90 09/24/2014 0550   GFRAA >90 09/24/2014 0550     Assessment and plan: 49 yo female with PMHx of Stage IV NSCLC (SCC) s/p chemo/XRT admitted with 3/23 with worsening pleuritic chest pain felt 2/2 malignancy. Hospital stay complciated by Afib w/RVR, Hypoxic Resp Failure, GI bleed. Palliative consulted for goals of care.   1. Code Status: FULL  2. GOC: See previous documentation.  Expressed concerns to Amalia about her continued hypoxemia and some of the uncertainty around how much we can improve this here.  Met with both her and her brother at bedside this afternoon to discuss  these concerns as well. Difficult for [name] to engage in conversation and brother often looks to positives when I speak of concerns going forward (lloks lot better today, pain better, O2 sats better, etc).  [name] will tell me how she needs rest.  I asked her if she was scared about what is going on, and she told me "I got that out over the past year going through cancer treatments".  Clotted Port and now on heparin drip which puts her at risk for recurrent GI bleed as well   Her O2 sats have improved some, and I think we probably need to see how much we can titrate down her high flow O2.  My concern remains about how much improvement we will see from here. She is certainly high risk for setbacks, but also need to worry about her reaching point where she would be able to tolerate further chemo, or potentially safely leave hospital with how much O2 she requires.  Still uncertainty in this regard and I suspect the next few days will be helpful in determining this trajectory.  We have not been able to effectively discuss the "what if's" should things not go as she hopes.    3. Symptom Management:  1. Cancer Related Pain: used  more IV dilaudid yesterday but less MSIR.  I think if she takes orals, she needs much less dilaudid. Overall trending towards improvement. Will continue to monitor. We talked about potentially rotating her pain meds and simplifying her regimen (perhaps oxycontin/oxycodone)  But she is resistant to doing this today as her pain is down to 2/3.  I think its fine to continue where we are as she is not close to d/c at this point.  Loss of Appetite: on megace, steroids. If clotting issues continue, may need to consider risk/benefit of megace. Her appetite remains poor  4. Psychosocial/Spiritual: Moved from [place] to be close to her brother [name]. Has another brother in [place], [place] but she felt [name] would better be able to care for her. No children.   Total Time: 45  minutes >50% of time spent in counseling and coordination of care regarding above  Doran Clay D.O. Palliative Medicine Team at [facility]  Pager: [phone] Team Phone: [phone]

## 2014-09-24 NOTE — Progress Notes (Signed)
Anticoagulation consult note f/u:  IV Heparin  Assessement:  See 4/6 note from A. Runyon for full consult note  2100 HL= 0.22 (below goal of 0.3-0.5)  No bleeding or IV interuptions per RN  Plan:  Increase heparin drip to 1350 units/hr  Recheck HL am  Dorrene German 09/24/2014 10:31 PM

## 2014-09-24 NOTE — Progress Notes (Signed)
ANTIBIOTIC CONSULT NOTE - FOLLOW UP  Pharmacy Consult for Cefepime/Vancomycin Indication: pneumonia  Allergies  Allergen Reactions  . Penicillins Rash    Patient Measurements: Height: 5\' 7"  (170.2 cm) Weight: 125 lb 14.1 oz (57.1 kg) IBW/kg (Calculated) : 61.6  Vital Signs: Temp: 97.5 F (36.4 C) (04/06 1200) Temp Source: Oral (04/06 1200) BP: 132/89 mmHg (04/06 1200) Pulse Rate: 80 (04/06 1200) Intake/Output from previous day: 04/05 0701 - 04/06 0700 In: 1695.9 [I.V.:245.9; IV Piggyback:1450] Out: 2845 [Urine:2845] Intake/Output from this shift: Total I/O In: 305.4 [I.V.:105.4; IV Piggyback:200] Out: 660 [Urine:660]  Labs:  Recent Labs  09/22/14 0405 09/23/14 0550 09/24/14 0550  WBC 15.3* 19.1* 15.6*  HGB 10.8* 11.7* 11.4*  PLT 222 331 377  CREATININE 0.39* 0.43* 0.57   Estimated Creatinine Clearance: 78.4 mL/min (by C-G formula based on Cr of 0.57). No results for input(s): VANCOTROUGH, VANCOPEAK, VANCORANDOM, GENTTROUGH, GENTPEAK, GENTRANDOM, TOBRATROUGH, TOBRAPEAK, TOBRARND, AMIKACINPEAK, AMIKACINTROU, AMIKACIN in the last 72 hours.   Microbiology: Recent Results (from the past 720 hour(s))  MRSA PCR Screening     Status: None   Collection Time: 09/12/14 10:27 PM  Result Value Ref Range Status   MRSA by PCR NEGATIVE NEGATIVE Final    Comment:        The GeneXpert MRSA Assay (FDA approved for NASAL specimens only), is one component of a comprehensive MRSA colonization surveillance program. It is not intended to diagnose MRSA infection nor to guide or monitor treatment for MRSA infections.   MRSA PCR Screening     Status: None   Collection Time: 09/18/14  4:00 AM  Result Value Ref Range Status   MRSA by PCR NEGATIVE NEGATIVE Final    Comment:        The GeneXpert MRSA Assay (FDA approved for NASAL specimens only), is one component of a comprehensive MRSA colonization surveillance program. It is not intended to diagnose MRSA infection nor to  guide or monitor treatment for MRSA infections.      Assessment: 29 yoF with h/o lung CA s/p chemo admitted with chest pain and dyspnea, developed vomiting with hematemesis. Pharmacy consulted to dose cefepime and vancomycin for suspected aspiration pneumonia in this immunocompromised patient.  CTa and CXR showed new multifocal PNA, Multiple destructive left rib metastases  3/27 >> Vanco >> 3/28 3/27 >> Levaquin >> 3/31 3/27 >> Flagyl >>  3/30 >> Cefepime >> 3/30 >> Vancomycin >>  Dose changes/drug level info: 3/31 VT at 1830 = 11.2 on 750mg  q8h (drawn 6.5hr after dose) - increase 1g q8h 4/1 1100 VT = 17 on 1g q8h (prior to 6th dose) 4/6 1100 VT = RN informed me that she drew VT after 1g dose given, ignore level 4/6 1900 VT = _____   Today is day #8 Cefepime 1GM q8h, Vancomycin 1g q8h, and Day #11 Flagyl 500 mg IV q8h for aspiration PNA. Avoided Zosyn d/t hx Pcn allergy (rash). WBC elevated - has been receiving steroids.  SCr low but has been increasing slightly, would like to check trough to make sure patient is not accumulating on q8h dosing.  Goal of Therapy:  Vancomycin trough level 15-20 mcg/ml  Doses adjusted per renal function Eradication of infection  Plan:  1. Check vancomycin trough tonight.  Question if we can narrow therapy and d/c vancomycin soon? 2. Continue Cefepime 1g IV q8h. 3.  F/u SCr, planned duration of treatment.  Hershal Coria, PharmD, BCPS Pager: [phone] 09/24/2014 2:38 PM    Randolm Idol, Estill Bamberg 09/24/2014,2:32 PM

## 2014-09-24 NOTE — Progress Notes (Signed)
PULMONARY / CRITICAL CARE MEDICINE   Name: [name] MRN: [id number] DOB: 1965-07-11    ADMISSION DATE:  09/10/2014 CONSULTATION DATE:  3/31  REFERRING MD :  [facility]  CHIEF COMPLAINT:  Hypoxia  INITIAL PRESENTATION: Chest pain  STUDIES:    SIGNIFICANT EVENTS: 3/30 desaturation  BRIEF  49 yo smoker, dx with  Left Lung cancer (july 2015) underwent chemo and Rtx till 2/16 at which time she moved to [place] from [place] for family support.  She is to establish with local Cancer MD but [facility] to [facility]  3/23 with chest pain and acute dyspnea. She further developed vomiting along with hematemesis and was evaluated by GI services. Seen on 3/26 by Cardiology service for new onset Afib and placed on IV amiodarone. 3/30 she developed increasing sob , O2 desaturation, and was moved to SDU and PCCM asked to evaluate. She is in venti mask at 50%, desaturates with any activity. CT of chest with left lower fibrothorax and metastases. Alert and interactive, she is a full code. PCCM will evaluate.   SUBJECTIVE:  More comfortable on high flow O2  VITAL SIGNS: Temp:  [97.4 F (36.3 C)-98.7 F (37.1 C)] 97.6 F (36.4 C) (04/06 0800) Pulse Rate:  [76-95] 85 (04/06 1100) Resp:  [10-21] 15 (04/06 1100) BP: (101-155)/(76-100) 120/84 mmHg (04/06 1100) SpO2:  [94 %-100 %] 96 % (04/06 1100) FiO2 (%):  [70 %-80 %] 70 % (04/06 0959) Weight:  [125 lb 14.1 oz (57.1 kg)] 125 lb 14.1 oz (57.1 kg) (04/06 0500) HEMODYNAMICS:   VENTILATOR SETTINGS: Vent Mode:  [-]  FiO2 (%):  [70 %-80 %] 70 % INTAKE / OUTPUT:  Intake/Output Summary (Last 24 hours) at 09/24/14 1121 Last data filed at 09/24/14 1100  Gross per 24 hour  Intake 1095.5 ml  Output   3295 ml  Net -2199.5 ml    PHYSICAL EXAMINATION: General: thin female Neuro: Intact HEENT:  No jvd/lan Cardiovascular:  HSR RRR Lungs:  Decreased air movement Abdomen:  Dereased bs Musculoskeletal:  intact Skin:  Warm, tatoos  +  LABS:  CBC  Recent Labs Lab 09/22/14 0405 09/23/14 0550 09/24/14 0550  WBC 15.3* 19.1* 15.6*  HGB 10.8* 11.7* 11.4*  HCT 33.6* 35.7* 35.0*  PLT 222 331 377   Coag's  Recent Labs Lab 09/23/14 1740  APTT 24   BMET  Recent Labs Lab 09/22/14 0405 09/23/14 0550 09/24/14 0550  NA 136 132* 130*  K 3.4* 4.5 4.9  CL 98 97 96  CO2 29 26 29   BUN 8 9 12   CREATININE 0.39* 0.43* 0.57  GLUCOSE 110* 129* 205*   Electrolytes  Recent Labs Lab 09/19/14 0500  09/22/14 0405 09/23/14 0550 09/24/14 0550  CALCIUM 8.2*  < > 8.0* 8.5 8.5  MG 1.6  --   --   --   --   PHOS 2.3  --   --   --   --   < > = values in this interval not displayed. Sepsis Markers  Recent Labs Lab 09/18/14 0300  LATICACIDVEN 1.2   ABG No results for input(s): PHART, PCO2ART, PO2ART in the last 168 hours. Liver Enzymes No results for input(s): AST, ALT, ALKPHOS, BILITOT, ALBUMIN in the last 168 hours. Cardiac Enzymes No results for input(s): TROPONINI, PROBNP in the last 168 hours. Glucose No results for input(s): GLUCAP in the last 168 hours.  Imaging Ir Removal [employer] Access W/ [place] W/o [place] Mod Sed  09/23/2014   CLINICAL DATA:  Malfunctioning right IJ power port catheter with evidence of SVC thrombosis by contrast injection.  EXAM: RIGHT IJ TUNNEL POWER PORT CATHETER REMOVAL  Date:  4/5/20164/10/2014 3:25 pm  Radiologist:  M. Daryll Brod, MD  MEDICATIONS AND MEDICAL HISTORY: 1% lidocaine locally  COMPLICATIONS: None immediate  PROCEDURE: Informed consent was obtained from the patient following explanation of the procedure, risks, benefits and alternatives. The patient understands, agrees and consents for the procedure. All questions were addressed. A time out was performed.  Maximal barrier sterile technique utilized including caps, mask, sterile gowns, sterile gloves, large sterile drape, hand hygiene, and ChloraPrep.  Under sterile conditions and local anesthesia, an incision was made along the  existing port catheter scar. Blunt and sharp dissection performed to remove the catheter. Hemostasis obtained. Incision closed in a two-layer fashion with interrupted and running Vicryl suture. No immediate complication. Sterile dressing applied. The patient tolerated the procedure well.  IMPRESSION: Successful right IJ power port catheter removal.   Electronically Signed   By: Jerilynn Mages.  Shick M.D.   On: 09/23/2014 15:42   Ir Cv Line Injection  09/23/2014   CLINICAL DATA:  Malfunctioning right IJ power port catheter  EXAM: FLUOROSCOPIC INJECTION OF THE EXISTING RIGHT IJ POWER PORT  Date:  4/5/20164/10/2014 9:55 am  Radiologist:  M. Daryll Brod, MD  Guidance:  FLUOROSCOPIC  FLUOROSCOPY TIME:  6 SECONDS, 18 mGY  MEDICATIONS AND MEDICAL HISTORY: NONE.  ANESTHESIA/SEDATION: NONE  CONTRAST:  10 CC OMNIPAQUE 267  COMPLICATIONS: None immediate  PROCEDURE: Informed consent was obtained from the patient following explanation of the procedure, risks, benefits and alternatives. The patient understands, agrees and consents for the procedure. All questions were addressed. A time out was performed.  Under sterile conditions, the existing accessed right IJ power port catheter was injected under fluoroscopy with contrast. Subtraction imaging performed.  Right IJ port catheter tubing has a high course into the right neck, suspect related to high access of the jugular vein during placement. Port catheter tubing terminates in the proximal SVC just below the innominate vein confluence. Upon injection, nodular filling defects are present in SVC with sluggish flow compatible with SVC thrombosis, appearing nearly occlusive. No significant fibrin sheath about the catheter tip. Contrast does flow into the lower SVC and right atrium.  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  These results were called by telephone at the time of interpretation on 09/23/2014 at 10:28 am to Dr. Phillips Climes , who  verbally acknowledged these results.   Electronically Signed   By: Jerilynn Mages.  Shick M.D.   On: 09/23/2014 11:29   Ir US Guide Vasc Access Right  09/23/2014   CLINICAL DATA:  Malfunctioning port catheter with SVC thrombosis. Port catheter removed. Limited access.  EXAM: RIGHT UPPER EXTREMITY DOUBLE-LUMEN PERIPHERAL PICC LINE PLACEMENT WITH ULTRASOUND AND FLUOROSCOPIC GUIDANCE  FLUOROSCOPY TIME:  NONE.  PROCEDURE: The patient was advised of the possible risks andcomplications and agreed to undergo the procedure. The patient was then brought to the angiographic suite for the procedure.  The RIGHT arm was prepped with chlorhexidine, drapedin the usual sterile fashion using maximum barrier technique (cap and mask, sterile gown, sterile gloves, large sterile sheet, hand hygiene and cutaneous antisepsis) and infiltrated locally with 1% Lidocaine.  Ultrasound demonstrated patency of the RIGHT BASILIC vein, and this was documented with an image. Under real-time ultrasound guidance, this vein was accessed with a 21 gauge micropuncture needle and image documentation was performed. A 0.018 wire was introduced in  to the vein. Over this, a 5 [place] DOUBLE lumen POWER PICC was advanced INTO THE RIGHT BASILIC VEIN PERIPHERALLY. THIS SHOULD BE USED AS PERIPHERAL ACCESS ONLY. The catheter was flushed and covered with asterile dressing.  Catheter length: 9 CM  Complications: NONE IMMEDIATE  IMPRESSION: Successful right upper extremitydouble-lumen intentionally short power PICC line placement with ultrasound guidance. Catheter is only 9 cm in length with the tip in the right basilic vein peripherally. This should be used as peripheral access only. The catheter is ready for use.   Electronically Signed   By: Jerilynn Mages.  Shick M.D.   On: 09/23/2014 15:49   Dg Chest Port 1 View  09/23/2014   CLINICAL DATA:  Pulmonary infiltrates.  EXAM: PORTABLE CHEST - 1 VIEW  COMPARISON:  09/22/2014.  FINDINGS: Power port catheter noted with tip in the superior  vena cava. Mediastinum and hilar structures are normal. Heart size stable. Persistent diffuse right lung infiltrate, no change. Small right pleural effusion cannot be excluded Persistent atelectasis left lower lobe with small left pleural effusion. No pneumothorax.  IMPRESSION: 1. Persistent diffuse right lung infiltrate. Small right pleural effusion cannot be excluded.  2. Persistent atelectasis of the left lower lobe with left pleural effusion. No change.   Electronically Signed   By: [place]   On: 09/23/2014 07:18   Ir Fluoro Guide Cv Midline Picc Right  09/23/2014   CLINICAL DATA:  Malfunctioning port catheter with SVC thrombosis. Port catheter removed. Limited access.  EXAM: RIGHT UPPER EXTREMITY DOUBLE-LUMEN PERIPHERAL PICC LINE PLACEMENT WITH ULTRASOUND AND FLUOROSCOPIC GUIDANCE  FLUOROSCOPY TIME:  NONE.  PROCEDURE: The patient was advised of the possible risks andcomplications and agreed to undergo the procedure. The patient was then brought to the angiographic suite for the procedure.  The RIGHT arm was prepped with chlorhexidine, drapedin the usual sterile fashion using maximum barrier technique (cap and mask, sterile gown, sterile gloves, large sterile sheet, hand hygiene and cutaneous antisepsis) and infiltrated locally with 1% Lidocaine.  Ultrasound demonstrated patency of the RIGHT BASILIC vein, and this was documented with an image. Under real-time ultrasound guidance, this vein was accessed with a 21 gauge micropuncture needle and image documentation was performed. A 0.018 wire was introduced in to the vein. Over this, a 5 [place] DOUBLE lumen POWER PICC was advanced INTO THE RIGHT BASILIC VEIN PERIPHERALLY. THIS SHOULD BE USED AS PERIPHERAL ACCESS ONLY. The catheter was flushed and covered with asterile dressing.  Catheter length: 9 CM  Complications: NONE IMMEDIATE  IMPRESSION: Successful right upper extremitydouble-lumen intentionally short power PICC line placement with ultrasound  guidance. Catheter is only 9 cm in length with the tip in the right basilic vein peripherally. This should be used as peripheral access only. The catheter is ready for use.   Electronically Signed   By: Jerilynn Mages.  Shick M.D.   On: 09/23/2014 15:49    Intake/Output Summary (Last 24 hours) at 09/24/14 1121 Last data filed at 09/24/14 1100  Gross per 24 hour  Intake 1095.5 ml  Output   3295 ml  Net -2199.5 ml    ASSESSMENT / PLAN:  PULMONARY OETT A: Lung cancer  -  locally advanced NSCLCA, squamous cell, stage IIIB. L hilar mass, L main obstruction. S/p local XRT and taxol /carboplatin with re-expansion of the LLL after rx.  Received 68.4 cGy ended 12/13/13 [facility] info reviewed, new PET 08/2014 > Enlarging LLL nodule,  L posterior 9th rib involvement and T4, T5.   Complicated L pleural space  with segmental collapse in LLL and some focal loculated pockets of pleural effusion.  Acute Resp Failure Aspiration PNA 09/17/14.  Pulmonary Edema - in setting of AFib w RVR  P:   BiPAP PRN O2 for pulse ox > 88%; agree high flow O2 device Diuresis as she can tolerate > dose lasix daily BD's Amiodarone d/c'd on 4/5, discussed with Dr Tamala Julian  Now that resp status stabilizing, I will review CT scan with TCTS regarding 1) the loculated pleural disease and any indication for clean-out, and 2) any role for palliative debulking of a L mainstem occlusion.     CARDIOVASCULAR CVL potra cath>> A:  A fib RVR  P:  Cards is following No anticoagulation due to GI bleed. xopenex  Stopped amiodarone 4/5  RENAL A:   Hypomagnesemia  P:   Follow BMP and replace lytes as indicated  GASTROINTESTINAL A:   GIB P:   PPI GI following  HEMATOLOGIC Anemia of critical illnes P PRBC per ICU guidelines   ONC A:   Stage IIIb squamous cell lung Ca P:   I suspect that we will need to concentrate on palliative therapies. Not sure that she is a candidate for any more XRT. She may be able to tolerate chemo at some  point but not presently. Will discuss the case with TCTS to see if any other interventions could be helpful as above  INFECTIOUS A:   Suspected aspiration pna in immunocompromised pt   P:   Abx 3/30 maxipime>> 3/28 levaquin>>3/30 3/28 flagyl>> 3/30 vanco>>  ENDOCRINE A:   DM  P:   SSI  NEUROLOGIC A:   No acute issue P:   RASS goal:0 Alert    FAMILY  - Updates: patient updated  - Inter-disciplinary family meet or Palliative Care meeting due by:  day Warren, NP-C Maribel Pgr: [phone] or [phone] 09/24/2014, 11:21 AM  Attending Note:  I have examined patient, reviewed labs, studies and notes. I have discussed the case with B Ollis, and I agree with the data and plans as amended above.  45 minutes spent reviewing old records and discussing oncology history.   Baltazar Apo, MD, PhD 09/24/2014, 12:05 PM [facility] [phone] or if no answer [phone]

## 2014-09-24 NOTE — Progress Notes (Signed)
[facility] Progress Note   [name] SWN:[id number] DOB: 1966-06-16 DOA: 09/10/2014  PCP: No primary care provider on file.  Brief narrative: [name] is a 49 y.o. female with Lung cancer who was treated for it in [place]. She was told recently after a PET scan that she had a mass in her lung and needed further treatment but she decided to move here with her brother to help care for her. Her records were sent to Dr Julien Nordmann. She presented to the hospital with ongoing severe left chest pain and was admitted for pain control. 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 Patient also developed A. fib with RVR, due to acute illness. Seen by cardiology. Placed on Amiodarone and back to normal sinus rhythm. Not anti-coagulation candidate. Continues to have poor respiratory status giving her pneumonia. Pulmonary service following. Patient had Port-A-Cath fluoroscopy to evaluate for malfunction, there was evidence of SVC thrombosis. Port-A-Cath was subsequently removed.  Subjective: Patient feels better this morning. Denies any nausea, vomiting. Chest pain is better.  Assessment/Plan:  Acute hypoxic resp failure -  In the setting of poor pulmonary function at baseline secondary to lung cancer, previous lung radiation, and COPD (no oxygen requirement though), apears to be worsened secondary to HCAP. -  Initially on short course IV steroids given her known history of COPD, discontinued giving no wheezing, continue with nebulizer treatment. - On broad-spectrum antibiotics for aspiration pneumonia. - Initially on IV Lasix for volume overload, currently stopped giving no further evidence of volume overload  - CT Chest angiogram negative for PE. - Was requiring BiPAP at bedtime. Transitioned from Ventimask to high flow nasal cannula so she can tolerate  eating and taking oral medications. - Has loculated pleural effusion, discussed with IR, unable to drain by ultrasound. Previous rounding MD also discussed with CT surgery. They don't think she would be a candidate for any intervention at this point.  SVC thrombosis. - Patient had malfunctioning Port-A-Cath, fluoroscopy showing SVC thrombosis. - Her Port-A-Cath was removed. - Intravenous heparin was started after discussions with GI. Monitor closely for signs of bleeding. - discussed with patient , unfortunately either decision has risk, to anticoagulate vs no intervention caries significant risk, and plan was for anticoagulation. - Will eventually need to be transitioned to oral anticoagulation.  Cancer associated pain - She has been on narcotics for months for pain starting at her left lower rib cage and radiating upwards towards axilla- the pain was severe even prior to coming to [facility] per brother and patient - Has a lesion in left post 9th rib which is the only imaging finding that could be casing her pain - Pain is much better controlled , palliative care input appreciated. - Started on decadron to help with chest wall metastasis.  HCAP  ( aspiration pneumonia) - may have aspirated on vomitus (hematemesis), started on HCAP coverage (used Vanc/ Levaquin and Flagyl) on 3/27 as symptoms started on her 5th day in the hospital  - Patient had deterioration of respiratory status 3/29, CT chest showing significant progression of her pneumonia, so currently on vancomycin/cefepime/Flagyl - Continue with pulmonary toilet and nebs as needed.   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 Due to concerns for pulmonary toxicity. Amiodarone discontinued. Cardiology was following. If she has recurrence of atrial fibrillation will need to call them again. - ECHO does not reveal underlying cardiac issues - [place] Vasc score of 1.  Lung cancer, primary, with metastasis from lung to other site - Was told the she needed further chemo after nodule seen on PET- patient decided to come stay with her brother to continue treatment here with Dr Julien Nordmann and presented to the hospital the next day due to uncontrolled pain . - CT showing chest wall involvement - Dr Julien Nordmann to see her as OP.   Hypokalemia - Repleted  Anorexia-  - started Ensure and Megace-   Ongoing smoking - Nicotine patch    Code Status: full code Family Communication: discussed with patient Disposition Plan: Remains on step down DVT prophylaxis:  On IV heparin   Consultants: GI, pulmonary , cardiology   Procedures:  1. EGD: very severe esophagitis possibly due to previous radiation combined with reflux  2. ECHO:  Left ventricle: LVEF is grossly normal at approximately 60%.Diffiuclt to fully evaluate regional wall motion as endocardiumis difficult to see well. The cavity size was normal. Wallthickness was normal. - Pulmonary arteries: PA peak pressure: 34 mm Hg (S).  Antibiotics: Anti-infectives    Start     Dose/Rate Route Frequency Ordered Stop   09/18/14 2000  vancomycin (VANCOCIN) IVPB 1000 mg/200 mL premix     1,000 mg 200 mL/hr over 60 Minutes Intravenous Every 8 hours 09/18/14 1918     09/18/14 1400  metroNIDAZOLE (FLAGYL) IVPB 500 mg     500 mg 100 mL/hr over 60 Minutes Intravenous Every 8 hours 09/18/14 1305     09/17/14 0400  vancomycin (VANCOCIN) IVPB 750 mg/150 ml premix  Status:  Discontinued     750 mg 150 mL/hr over 60 Minutes Intravenous Every 8 hours 09/17/14 0159 09/18/14 1918   09/17/14 0300  ceFEPIme (MAXIPIME) 1 g in dextrose 5 % 50 mL IVPB      1 g 100 mL/hr over 30 Minutes Intravenous 3 times per day 09/17/14 0154     09/15/14 1000  levofloxacin (LEVAQUIN) tablet 750 mg  Status:  Discontinued     750 mg Oral Daily 09/15/14 0751 09/18/14 1201   09/15/14 0800  metroNIDAZOLE (FLAGYL) tablet 500 mg  Status:  Discontinued     500 mg Oral 3 times per day 09/15/14 0751 09/18/14 1201   09/14/14 1000  metroNIDAZOLE (FLAGYL) IVPB 500 mg  Status:  Discontinued     500 mg 100 mL/hr over 60 Minutes Intravenous Every 8 hours 09/14/14 0845 09/15/14 0751   09/14/14 1000  levofloxacin (LEVAQUIN) IVPB 750 mg  Status:  Discontinued     750 mg 100 mL/hr over 90 Minutes Intravenous Every 24 hours 09/14/14 0847 09/15/14 0751   09/14/14 0900  vancomycin (VANCOCIN) 500 mg in sodium chloride 0.9 % 100 mL IVPB  Status:  Discontinued     500 mg 100 mL/hr over 60 Minutes Intravenous Every 8 hours 09/14/14 0850 09/15/14 1301      Objective: Filed Weights   09/21/14 0500 09/23/14 0500 09/24/14 0500  Weight: 66.3 kg (146 lb 2.6 oz) 63.9 kg (140 lb 14 oz) 57.1 kg (125 lb 14.1 oz)    Intake/Output Summary (Last 24 hours) at 09/24/14 0749 Last data filed at 09/24/14 0600  Gross per  24 hour  Intake 1695.9 ml  Output   2845 ml  Net -1149.1 ml     Vitals Filed Vitals:   09/24/14 0300 09/24/14 0400 09/24/14 0500 09/24/14 0600  BP: 131/94 142/95 141/87 136/90  Pulse: 86 85 83 76  Temp:  98.3 F (36.8 C)    TempSrc:  Oral    Resp: 14 12 17 11   Height:      Weight:   57.1 kg (125 lb 14.1 oz)   SpO2: 95% 99% 98% 99%    Exam:  General:  Pt is alert, in no distress. on high flow nasal cannula  HEENT: No icterus, No thrush  Cardiovascular: S1/S2 No murmur  Respiratory: decreased air entry on the left, rales at left lung base.  Abdomen: Soft, +Bowel sounds, non tender, non distended, no guarding  MSK: No LE edema, cyanosis or clubbing  Data Reviewed: Basic Metabolic Panel:  Recent Labs Lab 09/19/14 0500 09/20/14 1112  09/21/14 0502 09/22/14 0405 09/23/14 0550 09/24/14 0550  NA 132* 136 136 136 132* 130*  K 3.7 4.1 3.6 3.4* 4.5 4.9  CL 94* 98 100 98 97 96  CO2 32 30 29 29 26 29   GLUCOSE 138* 123* 108* 110* 129* 205*  BUN 6 9 8 8 9 12   CREATININE 0.40* 0.41* 0.39* 0.39* 0.43* 0.57  CALCIUM 8.2* 8.3* 8.3* 8.0* 8.5 8.5  MG 1.6  --   --   --   --   --   PHOS 2.3  --   --   --   --   --    CBC:  Recent Labs Lab 09/18/14 0300  09/20/14 1112 09/21/14 0502 09/22/14 0405 09/23/14 0550 09/24/14 0550  WBC 7.9  < > 13.6* 13.9* 15.3* 19.1* 15.6*  NEUTROABS 7.3  --   --   --   --   --   --   HGB 10.3*  < > 10.6* 10.6* 10.8* 11.7* 11.4*  HCT 30.3*  < > 32.9* 33.0* 33.6* 35.7* 35.0*  MCV 84.4  < > 85.7 85.5 84.4 84.6 85.2  PLT 249  < > 156 188 222 331 377  < > = values in this interval not displayed.  BNP (last 3 results)  Recent Labs  09/22/14 0405  BNP 301.5*    Recent Results (from the past 240 hour(s))  MRSA PCR Screening     Status: None   Collection Time: 09/18/14  4:00 AM  Result Value Ref Range Status   MRSA by PCR NEGATIVE NEGATIVE Final    Comment:        The GeneXpert MRSA Assay (FDA approved for NASAL specimens only), is one component of a comprehensive MRSA colonization surveillance program. It is not intended to diagnose MRSA infection nor to guide or monitor treatment for MRSA infections.      Scheduled Meds:  Scheduled Meds: . acidophilus  1 capsule Oral Daily  . antiseptic oral rinse  7 mL Mouth Rinse BID  . antiseptic oral rinse  7 mL Mouth Rinse q12n4p  . ceFEPime (MAXIPIME) IV  1 g Intravenous 3 times per day  . chlorhexidine  15 mL Mouth Rinse BID  . dexamethasone  8 mg Intravenous Q24H  . dextromethorphan-guaiFENesin  1 tablet Oral BID  . feeding supplement (ENSURE ENLIVE)  237 mL Oral TID BM  . feeding supplement (RESOURCE BREEZE)  1 Container Oral TID BM  . fentaNYL  100 mcg Transdermal Q72H  . ipratropium  0.5 mg Nebulization Q6H  .  levalbuterol   0.63 mg Nebulization 4 times per day  . megestrol  400 mg Oral BID  . metronidazole  500 mg Intravenous Q8H  . morphine  30 mg Oral Q12H  . nicotine  14 mg Transdermal Daily  . ondansetron  4 mg Oral Q6H   Or  . ondansetron (ZOFRAN) IV  4 mg Intravenous Q6H  . pantoprazole (PROTONIX) IV  40 mg Intravenous Q12H  . polyethylene glycol  17 g Oral Daily  . senna-docusate  1 tablet Oral QHS  . sodium chloride  3 mL Intravenous Q12H  . vancomycin  1,000 mg Intravenous Q8H   Continuous Infusions: . heparin      Bonnielee Haff, MD [phone]  09/24/2014, 7:49 AM  LOS: 13 days   [facility] Office  [phone] Pager - Text Page per [email or web address]  If 7PM-7AM, please contact night-coverage [email or web address]

## 2014-09-24 NOTE — Progress Notes (Signed)
Maintaining NSR off amiodarone. Please call if we can help.

## 2014-09-24 NOTE — Progress Notes (Addendum)
ANTICOAGULATION CONSULT NOTE - FOLLOW UP  Pharmacy Consult for Heparin Indication: SVC thrombus  Allergies  Allergen Reactions  . Penicillins Rash    Patient Measurements: Height: 5\' 7"  (170.2 cm) Weight: 125 lb 14.1 oz (57.1 kg) IBW/kg (Calculated) : 61.6 Heparin Dosing Weight: 63 kg   Vital Signs: Temp: 98.3 F (36.8 C) (04/06 0400) Temp Source: Oral (04/06 0400) BP: 136/90 mmHg (04/06 0600) Pulse Rate: 76 (04/06 0600)  Labs:  Recent Labs  09/22/14 0405 09/23/14 0550 09/23/14 1740 09/24/14 0500 09/24/14 0550  HGB 10.8* 11.7*  --   --  11.4*  HCT 33.6* 35.7*  --   --  35.0*  PLT 222 331  --   --  377  APTT  --   --  24  --   --   HEPARINUNFRC  --   --   --  <0.10*  --   CREATININE 0.39* 0.43*  --   --  0.57    Estimated Creatinine Clearance: 78.4 mL/min (by C-G formula based on Cr of 0.57).   Medical History: Past Medical History  Diagnosis Date  . Cancer     left bronchioles    Medications:  Scheduled:  . acidophilus  1 capsule Oral Daily  . antiseptic oral rinse  7 mL Mouth Rinse BID  . antiseptic oral rinse  7 mL Mouth Rinse q12n4p  . ceFEPime (MAXIPIME) IV  1 g Intravenous 3 times per day  . chlorhexidine  15 mL Mouth Rinse BID  . dexamethasone  8 mg Intravenous Q24H  . dextromethorphan-guaiFENesin  1 tablet Oral BID  . feeding supplement (ENSURE ENLIVE)  237 mL Oral TID BM  . feeding supplement (RESOURCE BREEZE)  1 Container Oral TID BM  . fentaNYL  100 mcg Transdermal Q72H  . ipratropium  0.5 mg Nebulization Q6H  . levalbuterol  0.63 mg Nebulization 4 times per day  . megestrol  400 mg Oral BID  . metronidazole  500 mg Intravenous Q8H  . morphine  30 mg Oral Q12H  . nicotine  14 mg Transdermal Daily  . ondansetron  4 mg Oral Q6H   Or  . ondansetron (ZOFRAN) IV  4 mg Intravenous Q6H  . pantoprazole (PROTONIX) IV  40 mg Intravenous Q12H  . polyethylene glycol  17 g Oral Daily  . senna-docusate  1 tablet Oral QHS  . sodium chloride  3 mL  Intravenous Q12H  . vancomycin  1,000 mg Intravenous Q8H   Infusions:  . heparin 750 Units/hr (09/23/14 2356)    Assessment: 48YOF admitted on 3/24 for pain control due to left-sided chest pain related to cancer pain. Patient has a history of metastatic lung cancer. Patient known to pharmacy from dosing of antibiotics. Of note during this hospitalization, patient with upper GI bleed.  Today porta-cath evaluated via fluoroscopy and showed + SVC thrombosis. Porta-cath removed in radiology today. Pharmacy has been consulted to dose IV heparin began without bolus due to recent GI bleed for the treatment of thrombus.    Today, 09/24/14:  H/H low  Hgb 11.4  PLTs WNL  No signs of bleeding noted  0500 heparin level SUBtherapeutic at  <0.10 IU/mL  Since, heparin level is subtherapeutic and taking into account the patient's GI bleed, will not bolus, but will increase the dose of heparin today.   Goal of Therapy:  Heparin Level (0.3-0.5) - aim for low end due to recent GI bleed Monitor platelets by anticoagulation protocol: Yes   Plan:  Increase IV heparin gtt to 1000 units/hr  Check heparin level 6 hr   Check daily heparin level & CBC  Monitor for signs/symptoms of bleeding  Gloriajean Dell, PharmD Candidate   Addendum: I agree with the above assessment and plan.  Hershal Coria, PharmD, BCPS Pager: [phone] 09/23/2014 12:56 PM   Addendum 09/24/2014 2:27 PM Heparin level increased but still remains subtherapeutic at 0.19 with infusion at 1000 units/hr.  Plan: Increase heparin infusion to 1200 units/hr. SCr low but increasing. Noted significant weight difference today. Check heparin level 6 hours after rate increase.  Hershal Coria, PharmD, BCPS Pager: [phone] 09/24/2014 2:28 PM

## 2014-09-25 DIAGNOSIS — I82290 Acute embolism and thrombosis of other thoracic veins: Secondary | ICD-10-CM

## 2014-09-25 LAB — BASIC METABOLIC PANEL
Anion gap: 9 (ref 5–15)
BUN: 11 mg/dL (ref 6–23)
CALCIUM: 8.8 mg/dL (ref 8.4–10.5)
CO2: 25 mmol/L (ref 19–32)
CREATININE: 0.47 mg/dL — AB (ref 0.50–1.10)
Chloride: 98 mmol/L (ref 96–112)
Glucose, Bld: 113 mg/dL — ABNORMAL HIGH (ref 70–99)
Potassium: 4.9 mmol/L (ref 3.5–5.1)
SODIUM: 132 mmol/L — AB (ref 135–145)

## 2014-09-25 LAB — HEPARIN LEVEL (UNFRACTIONATED)
HEPARIN UNFRACTIONATED: 0.56 [IU]/mL (ref 0.30–0.70)
HEPARIN UNFRACTIONATED: 0.43 [IU]/mL (ref 0.30–0.70)
Heparin Unfractionated: 0.38 IU/mL (ref 0.30–0.70)

## 2014-09-25 LAB — CBC
HEMATOCRIT: 35.9 % — AB (ref 36.0–46.0)
HEMOGLOBIN: 11.5 g/dL — AB (ref 12.0–15.0)
MCH: 27.3 pg (ref 26.0–34.0)
MCHC: 32 g/dL (ref 30.0–36.0)
MCV: 85.1 fL (ref 78.0–100.0)
Platelets: 422 10*3/uL — ABNORMAL HIGH (ref 150–400)
RBC: 4.22 MIL/uL (ref 3.87–5.11)
RDW: 16.5 % — ABNORMAL HIGH (ref 11.5–15.5)
WBC: 16 10*3/uL — ABNORMAL HIGH (ref 4.0–10.5)

## 2014-09-25 LAB — VANCOMYCIN, TROUGH: Vancomycin Tr: 17.5 ug/mL (ref 10.0–20.0)

## 2014-09-25 MED ORDER — HEPARIN (PORCINE) IN NACL 100-0.45 UNIT/ML-% IJ SOLN
1300.0000 [IU]/h | INTRAMUSCULAR | Status: DC
  Administered 2014-09-25: 1300 [IU]/h via INTRAVENOUS
  Filled 2014-09-25 (×5): qty 250

## 2014-09-25 NOTE — Progress Notes (Signed)
RT assisted with transporting PT to 1412- placed on 100% NRB (15LPM)- uneventful- Sp02 remained between 97%-100%. When we arrived in room RT discovered the Air flow connection was broken. RT informed RN that Air flow connection needs repairing or PT needs to be moved to another room. PT continued to have Sp02 >=96%- RT removed side flap from NRB- now a partial rebreather- Sp02 remained >=95%. RT had discussion with RN that PT is doing well at this time and to attempted to titrate 02. RN called to inform RT that Air flow connection is repaired- RN stated that PT is still oxygenating well at this time- RT again encouraged RN to titrate 02. Third shift RT Caro Hight) is aware of PT and situation.

## 2014-09-25 NOTE — Progress Notes (Signed)
PULMONARY / CRITICAL CARE MEDICINE   Name: Jadamarie Butson MRN: [id number] DOB: 1966/01/20    ADMISSION DATE:  09/10/2014 CONSULTATION DATE:  3/31  REFERRING MD :  [facility]  CHIEF COMPLAINT:  Hypoxia  INITIAL PRESENTATION: Chest pain  STUDIES:    SIGNIFICANT EVENTS: 3/30 desaturation  BRIEF  49 yo smoker, dx with  Left Lung cancer (july 2015) underwent chemo and Rtx till 2/16 at which time she moved to [place] from [place] for family support.  She is to establish with local Cancer MD but presents to [facility]  3/23 with chest pain and acute dyspnea. She further developed vomiting along with hematemesis and was evaluated by GI services. Seen on 3/26 by Cardiology service for new onset Afib and placed on IV amiodarone. 3/30 she developed increasing sob , O2 desaturation, and was moved to SDU and PCCM asked to evaluate. She is in venti mask at 50%, desaturates with any activity. CT of chest with left lower fibrothorax and metastases. Alert and interactive, she is a full code. PCCM will evaluate.   SUBJECTIVE:  More comfortable on high flow O2  VITAL SIGNS: Temp:  [97.2 F (36.2 C)-98.7 F (37.1 C)] 97.2 F (36.2 C) (04/07 1232) Pulse Rate:  [79-101] 86 (04/07 1300) Resp:  [11-31] 14 (04/07 1300) BP: (102-143)/(77-98) 115/79 mmHg (04/07 1300) SpO2:  [86 %-97 %] 94 % (04/07 1300) FiO2 (%):  [40 %-60 %] 40 % (04/07 0909) Weight:  [56.6 kg (124 lb 12.5 oz)] 56.6 kg (124 lb 12.5 oz) (04/07 0600) HEMODYNAMICS:   VENTILATOR SETTINGS: Vent Mode:  [-]  FiO2 (%):  [40 %-60 %] 40 % INTAKE / OUTPUT:  Intake/Output Summary (Last 24 hours) at 09/25/14 1418 Last data filed at 09/25/14 6767  Gross per 24 hour  Intake 939.57 ml  Output   2420 ml  Net -1480.43 ml    PHYSICAL EXAMINATION: General: thin female Neuro: Intact HEENT:  No jvd/lan Cardiovascular:  HSR RRR Lungs:  Decreased air movement Abdomen:  Dereased bs Musculoskeletal:  intact Skin:  Warm, tatoos  +  LABS:  CBC  Recent Labs Lab 09/23/14 0550 09/24/14 0550 09/25/14 0345  WBC 19.1* 15.6* 16.0*  HGB 11.7* 11.4* 11.5*  HCT 35.7* 35.0* 35.9*  PLT 331 377 422*   Coag's  Recent Labs Lab 09/23/14 1740  APTT 24   BMET  Recent Labs Lab 09/23/14 0550 09/24/14 0550 09/25/14 0345  NA 132* 130* 132*  K 4.5 4.9 4.9  CL 97 96 98  CO2 26 29 25   BUN 9 12 11   CREATININE 0.43* 0.57 0.47*  GLUCOSE 129* 205* 113*   Electrolytes  Recent Labs Lab 09/19/14 0500  09/23/14 0550 09/24/14 0550 09/25/14 0345  CALCIUM 8.2*  < > 8.5 8.5 8.8  MG 1.6  --   --   --   --   PHOS 2.3  --   --   --   --   < > = values in this interval not displayed. Sepsis Markers No results for input(s): LATICACIDVEN, PROCALCITON, O2SATVEN in the last 168 hours. ABG No results for input(s): PHART, PCO2ART, PO2ART in the last 168 hours. Liver Enzymes No results for input(s): AST, ALT, ALKPHOS, BILITOT, ALBUMIN in the last 168 hours. Cardiac Enzymes No results for input(s): TROPONINI, PROBNP in the last 168 hours. Glucose No results for input(s): GLUCAP in the last 168 hours.  Imaging Dg Chest Port 1 View  09/24/2014   CLINICAL DATA:  Pulmonary infiltrate.  EXAM:  PORTABLE CHEST - 1 VIEW  COMPARISON:  09/23/2014.  09/22/2014 .  FINDINGS: Interim removal power Port catheter. Mediastinum and hilar structures are normal. Diffuse right lung infiltrate. Left lung base atelectasis and/or infiltrate with persistent left pleural effusion. No pneumothorax. Stable heart size.  IMPRESSION: 1. Interim removal of PowerPort catheter. 2. Persistent diffuse right lung infiltrate. Persistent atelectasis and or infiltrate left lung base with persistent left pleural effusion. No significant interim change.   Electronically Signed   By: Marcello Moores  Register   On: 09/24/2014 07:17    Intake/Output Summary (Last 24 hours) at 09/25/14 1418 Last data filed at 09/25/14 0637  Gross per 24 hour  Intake 939.57 ml  Output   2420  ml  Net -1480.43 ml    ASSESSMENT / PLAN:  PULMONARY OETT A: Lung cancer  -  locally advanced NSCLCA, squamous cell, stage IIIB. L hilar mass, L main obstruction. S/p local XRT and taxol /carboplatin with re-expansion of the LLL after rx.  Received 68.4 cGy ended 12/13/13 [facility] info reviewed, new PET 08/2014 > Enlarging LLL nodule,  L posterior 9th rib involvement and T4, T5.   Complicated L pleural space with segmental collapse in LLL and some focal loculated pockets of pleural effusion.  Acute Resp Failure Suspected Aspiration PNA 09/17/14.  B infiltrates, either ALI, PNA, pulm edema or also concerning for possible lymphangitic spread P:   BiPAP PRN > now off  O2 for pulse ox > 88%; agree high flow O2 device Diuresis as she can tolerate > dose lasix daily BD's Amiodarone d/c'd on 4/5, discussed with Dr Tamala Julian  I have reviewed case with Dr Pablo Ledger and with Dr Servando Snare. Unfortunately no role for either XRT or surgical debulking, surgical drainage loculated effusion. I suspect if effusion was drained the lung would show entrapment. Will have to follow her, see if she improves enough to get to see Dr Julien Nordmann as an outpt. Not clear to me that this will be possible. Agree with Palliative Care efforts. This appears to be beyond treatment, rib and chest wall pain is severe   CARDIOVASCULAR CVL potra cath>> A:  A fib RVR  P:  Cards is following No anticoagulation due to GI bleed. xopenex  Stopped amiodarone 4/5  RENAL A:   Hypomagnesemia  P:   Follow BMP and replace lytes as indicated  GASTROINTESTINAL A:   GIB P:   PPI GI following  HEMATOLOGIC Anemia of critical illnes P PRBC per ICU guidelines   ONC A:   Stage IIIb squamous cell lung Ca P:   I suspect that we will need to concentrate on palliative therapies. Not sure that she is a candidate for any more XRT. She may be able to tolerate chemo at some point but not presently. Will discuss the case with TCTS to see  if any other interventions could be helpful as above  INFECTIOUS A:   Suspected aspiration pna in immunocompromised pt   P:   Abx 3/30 maxipime>> 3/28 levaquin>>3/30 3/28 flagyl>> 3/30 vanco>>  ENDOCRINE A:   DM  P:   SSI  NEUROLOGIC A:   No acute issue P:   RASS goal:0 Alert    FAMILY  - Updates: patient updated  - Inter-disciplinary family meet or Palliative Care meeting due by:  day 7   PCCM will sign off. Please call if we can help you in any way  Baltazar Apo, MD, PhD 09/25/2014, 2:18 PM  Pulmonary and Critical Care [phone] or if no answer  [phone]   

## 2014-09-25 NOTE — Progress Notes (Signed)
Brief Pharmacy note: Heparin infusion for SVC thrombus  Heparin level at 1700 = 0.43 units/ml on 1300 units/hr Therapeutic goal 0.3-0.5 units/ml  This is a confirmatory level, first level this am on 1300 units/hr = 0.38  Continue Heparin at 1300 units/hr, Daily Heparin levels  Thank you,  Minda Ditto PharmD Pager [phone] 09/25/2014, 6:47 PM

## 2014-09-25 NOTE — Progress Notes (Signed)
PT Cancellation Note  Patient Details Name: [name] MRN: [id number] DOB: 04/09/1966   Cancelled Treatment:    Reason Eval/Treat Not Completed: Patient at procedure or test/unavailable (having chest vest treatment. )   Claretha Cooper 09/25/2014, 3:26 PM Tresa Endo PT [phone]

## 2014-09-25 NOTE — Progress Notes (Signed)
[facility] Progress Note   [name] IHK:[id number] DOB: 1965-08-23 DOA: 09/10/2014  PCP: No primary care provider on file.  Brief narrative: [name] is a 49 y.o. female with Lung cancer who was treated for it in [place]. She was told recently after a PET scan that she had a mass in her lung and needed further treatment but she decided to move here with her brother to help care for her. Her records were sent to Dr Julien Nordmann. She presented to the hospital with ongoing severe left chest pain and was admitted for pain control. 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 Patient also developed A. fib with RVR, due to acute illness. Seen by cardiology. Placed on Amiodarone and back to normal sinus rhythm. Not anti-coagulation candidate. Continues to have poor respiratory status giving her pneumonia. Pulmonary service following. Patient had Port-A-Cath fluoroscopy to evaluate for malfunction, there was evidence of SVC thrombosis. Port-A-Cath was subsequently removed. She was started on IV heparin.  Subjective: Patient denies any new complaints. Continues to have the left-sided chest pain, which is thought secondary to cancer. Denies Nausea, vomiting.   Assessment/Plan:  Acute hypoxic resp failure -  In the setting of poor pulmonary function at baseline secondary to lung cancer, previous lung radiation, and COPD (no oxygen requirement though), apears to be worsened secondary to HCAP. -  Initially on short course IV steroids given her known history of COPD, discontinued giving no wheezing, continue with nebulizer treatment. - On broad-spectrum antibiotics for aspiration pneumonia. - Initially on IV Lasix for volume overload, currently stopped giving no further evidence of volume overload  - CT Chest angiogram negative for PE. - Was requiring  BiPAP at bedtime. Transitioned from Ventimask to high flow nasal cannula so she can tolerate eating and taking oral medications. - Has loculated pleural effusion, discussed with IR, unable to drain by ultrasound. Previous rounding MD also discussed with CT surgery. They don't think she would be a candidate for any intervention at this point. Pulmonology is pursuing this issue further.  SVC thrombosis. - Patient had malfunctioning Port-A-Cath, fluoroscopy showing SVC thrombosis. - Her Port-A-Cath was removed. - Intravenous heparin was started after discussions with GI. Monitor closely for signs of bleeding. - Risks and benefits of anticoagulation were discussed with patient. Subsequently intravenous heparin was initiated. - Will eventually need to be transitioned to oral anticoagulation. Will need to discuss with oncology regarding choice of agent.  Cancer associated pain - She has been on narcotics for months for pain starting at her left lower rib cage and radiating upwards towards axilla- the pain was severe even prior to coming to [facility] per brother and patient - Has a lesion in left post 9th rib which is the only imaging finding that could be casing her pain - Pain is much better controlled , palliative care input appreciated. - Started on decadron to help with chest wall metastasis.  HCAP (aspiration pneumonia) with loculated pleural effusion - may have aspirated on vomitus (hematemesis), started on HCAP coverage (used Vanc/ Levaquin and Flagyl) on 3/27 as symptoms started on her 5th day in the hospital  - Patient had deterioration of respiratory status 3/29, CT chest showing significant progression of her pneumonia, so currently on vancomycin/cefepime/Flagyl - Continue with pulmonary toilet and nebs as needed. - Pulmonology is following.   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 Due to concerns for pulmonary toxicity. Amiodarone discontinued. Cardiology was following. If she has recurrence of atrial fibrillation will need to call them again. - ECHO does not reveal underlying cardiac issues - [place] Vasc score of 1.  Lung cancer, primary, with metastasis from lung to other site - Was told the she needed further chemo after nodule seen on PET- patient decided to come stay with her brother to continue treatment here with Dr Julien Nordmann and presented to the hospital the next day due to uncontrolled pain . - CT showing chest wall involvement - Dr Julien Nordmann to see her as OP.   Hypokalemia - Repleted  Anorexia-  - started Ensure and Megace-   Ongoing smoking - Nicotine patch    Code Status: full code Family Communication: discussed with patient Disposition Plan: Remains on step down DVT prophylaxis:  On IV heparin   Consultants: GI, pulmonary , cardiology   Procedures:  1. EGD: very severe esophagitis possibly due to previous radiation combined with reflux  2. ECHO:  Left ventricle: LVEF is grossly normal at approximately 60%.Diffiuclt to fully evaluate regional wall motion as endocardiumis difficult to see well. The cavity size was normal. Wallthickness was normal. - Pulmonary arteries: PA peak pressure: 34 mm Hg (S).  Antibiotics: Anti-infectives    Start     Dose/Rate Route Frequency Ordered Stop   09/18/14 2000  vancomycin (VANCOCIN) IVPB 1000 mg/200 mL premix     1,000 mg 200 mL/hr over 60 Minutes Intravenous Every 8 hours 09/18/14 1918     09/18/14 1400  metroNIDAZOLE (FLAGYL) IVPB 500 mg     500 mg 100 mL/hr over 60 Minutes Intravenous Every 8 hours 09/18/14 1305     09/17/14 0400  vancomycin (VANCOCIN)  IVPB 750 mg/150 ml premix  Status:  Discontinued     750 mg 150 mL/hr over 60 Minutes Intravenous Every 8 hours 09/17/14 0159 09/18/14 1918   09/17/14 0300  ceFEPIme (MAXIPIME) 1 g in dextrose 5 % 50 mL IVPB     1 g 100 mL/hr over 30 Minutes Intravenous 3 times per day 09/17/14 0154     09/15/14 1000  levofloxacin (LEVAQUIN) tablet 750 mg  Status:  Discontinued     750 mg Oral Daily 09/15/14 0751 09/18/14 1201   09/15/14 0800  metroNIDAZOLE (FLAGYL) tablet 500 mg  Status:  Discontinued     500 mg Oral 3 times per day 09/15/14 0751 09/18/14 1201   09/14/14 1000  metroNIDAZOLE (FLAGYL) IVPB 500 mg  Status:  Discontinued     500 mg 100 mL/hr over 60 Minutes Intravenous Every 8 hours 09/14/14 0845 09/15/14 0751   09/14/14 1000  levofloxacin (LEVAQUIN) IVPB 750 mg  Status:  Discontinued     750 mg 100 mL/hr over 90 Minutes Intravenous Every 24 hours 09/14/14 0847 09/15/14 0751   09/14/14 0900  vancomycin (VANCOCIN) 500 mg in sodium chloride 0.9 % 100 mL IVPB  Status:  Discontinued     500 mg 100 mL/hr over 60 Minutes Intravenous Every 8 hours 09/14/14 0850 09/15/14 1301      Objective: Filed Weights   09/23/14 0500 09/24/14 0500 09/25/14 0600  Weight: 63.9 kg (140 lb 14 oz) 57.1 kg (125  lb 14.1 oz) 56.6 kg (124 lb 12.5 oz)    Intake/Output Summary (Last 24 hours) at 09/25/14 0718 Last data filed at 09/25/14 9233  Gross per 24 hour  Intake 1434.99 ml  Output   3080 ml  Net -1645.01 ml     Vitals Filed Vitals:   09/25/14 0400 09/25/14 0500 09/25/14 0600 09/25/14 0700  BP: 129/86 102/80 107/85 113/82  Pulse: 80 82 81 79  Temp: 97.6 F (36.4 C)     TempSrc: Oral     Resp: 18 11 16 13   Height:      Weight:   56.6 kg (124 lb 12.5 oz)   SpO2: 95% 96% 95% 96%    Exam:  General:  Pt is alert, in no distress. on high flow nasal cannula  HEENT: No icterus, No thrush  Cardiovascular: S1/S2 No murmur  Respiratory: decreased air entry at the bases. No wheezing. No  rhonchi.  Abdomen: Soft, +Bowel sounds, non tender, non distended, no guarding  MSK: No LE edema, cyanosis or clubbing  Data Reviewed: Basic Metabolic Panel:  Recent Labs Lab 09/19/14 0500  09/21/14 0502 09/22/14 0405 09/23/14 0550 09/24/14 0550 09/25/14 0345  NA 132*  < > 136 136 132* 130* 132*  K 3.7  < > 3.6 3.4* 4.5 4.9 4.9  CL 94*  < > 100 98 97 96 98  CO2 32  < > 29 29 26 29 25   GLUCOSE 138*  < > 108* 110* 129* 205* 113*  BUN 6  < > 8 8 9 12 11   CREATININE 0.40*  < > 0.39* 0.39* 0.43* 0.57 0.47*  CALCIUM 8.2*  < > 8.3* 8.0* 8.5 8.5 8.8  MG 1.6  --   --   --   --   --   --   PHOS 2.3  --   --   --   --   --   --   < > = values in this interval not displayed.  CBC:  Recent Labs Lab 09/21/14 0502 09/22/14 0405 09/23/14 0550 09/24/14 0550 09/25/14 0345  WBC 13.9* 15.3* 19.1* 15.6* 16.0*  HGB 10.6* 10.8* 11.7* 11.4* 11.5*  HCT 33.0* 33.6* 35.7* 35.0* 35.9*  MCV 85.5 84.4 84.6 85.2 85.1  PLT 188 222 331 377 422*    BNP (last 3 results)  Recent Labs  09/22/14 0405  BNP 301.5*    Recent Results (from the past 240 hour(s))  MRSA PCR Screening     Status: None   Collection Time: 09/18/14  4:00 AM  Result Value Ref Range Status   MRSA by PCR NEGATIVE NEGATIVE Final    Comment:        The GeneXpert MRSA Assay (FDA approved for NASAL specimens only), is one component of a comprehensive MRSA colonization surveillance program. It is not intended to diagnose MRSA infection nor to guide or monitor treatment for MRSA infections.      Scheduled Meds:  Scheduled Meds: . acidophilus  1 capsule Oral Daily  . antiseptic oral rinse  7 mL Mouth Rinse BID  . antiseptic oral rinse  7 mL Mouth Rinse q12n4p  . ceFEPime (MAXIPIME) IV  1 g Intravenous 3 times per day  . chlorhexidine  15 mL Mouth Rinse BID  . dexamethasone  8 mg Intravenous Q24H  . dextromethorphan-guaiFENesin  1 tablet Oral BID  . feeding supplement (ENSURE ENLIVE)  237 mL Oral TID BM  .  feeding supplement (RESOURCE BREEZE)  1 Container Oral TID  BM  . fentaNYL  100 mcg Transdermal Q72H  . ipratropium  0.5 mg Nebulization Q6H  . levalbuterol  0.63 mg Nebulization 4 times per day  . megestrol  400 mg Oral BID  . metronidazole  500 mg Intravenous Q8H  . morphine  30 mg Oral Q12H  . nicotine  14 mg Transdermal Daily  . ondansetron  4 mg Oral Q6H   Or  . ondansetron (ZOFRAN) IV  4 mg Intravenous Q6H  . pantoprazole (PROTONIX) IV  40 mg Intravenous Q12H  . polyethylene glycol  17 g Oral Daily  . senna-docusate  1 tablet Oral QHS  . sodium chloride  3 mL Intravenous Q12H  . vancomycin  1,000 mg Intravenous Q8H   Continuous Infusions: . heparin 1,300 Units/hr (09/25/14 0448)    Bonnielee Haff, MD [phone]  09/25/2014, 7:18 AM  LOS: 14 days   [facility] Office  [phone] Pager - Text Page per [email or web address]  If 7PM-7AM, please contact night-coverage [email or web address]

## 2014-09-25 NOTE — Progress Notes (Signed)
Rx Brief Antibiotic note:  Vancomycin  Assessement:  VT=17.5 mg/L after 1Gm IV q8h (at goal of 15-20 mg/L)  Scr stable (0.57>0.47)  Plan  Continue Vancomycin 1gm IV q8h  F/u SCr/additional levels/planned duration of treatment  Dorrene German 09/25/2014 5:13 AM

## 2014-09-25 NOTE — Progress Notes (Signed)
Pt transferred to unit at 1830.  I agree with the previous nurses assessment.  Pt currently on partial rebreather at 100%, RT reccommended to keep pt on this and to titrate down 02 as possible.  MD made aware of the switch from the high flow to the partial nonrebreather.  Will monitor closely.

## 2014-09-25 NOTE — Progress Notes (Signed)
CARE MANAGEMENT NOTE 09/25/2014  Patient:  [name], [name]   Account Number:  [id number]  Date Initiated:  09/15/2014  Documentation initiated by:  Orville Widmann  Subjective/Objective Assessment:   gi bld with chest pain -tropnoin neg/ hgb stable edg severe corrorisive esophagitis with necrosis Lynwood Dawley     Action/Plan:   home when stable   Anticipated DC Date:  09/28/2014   Anticipated DC Plan:  HOME/SELF CARE  In-house referral  NA      DC Planning Services  CM consult      PAC Choice  NA   Choice offered to / List presented to:  NA      DME agency  NA        [place] agency  NA   Status of service:  In process, will continue to follow Medicare Important Message given?   (If response is "NO", the following Medicare IM given date fields will be blank) Date Medicare IM given:   Medicare IM given by:   Date Additional Medicare IM given:   Additional Medicare IM given by:    Discharge Disposition:    Per UR Regulation:  Reviewed for med. necessity/level of care/duration of stay  If discussed at [place] of Stay Meetings, dates discussed:    Comments:  [id number]/Kandace Elrod Victorio Palm: [phone] Chart reviewed for patient needs and discharge planning. Patient moaning out loud is discomfort, pain meds being given, increased confusion is present.  [id number]/DVVOHY [name], [name], [name]: [phone] Case management. Chart reviewed for discharge planning and present needs. Discharge needs: none present at time of review. Remains on bipap with FI02 of 60-98% to maintain 02 level to 92%  September 18, 2014/Jaquelyne Firkus L. Rosana Hoes, RN, [name], CCM. Case Management [place] [phone] No discharge needs present of time of review. Patient transferred down to sdu due to hypoxia and requiring 50-100% o2 via aerosol face mask.  High risk of intubation.  September 15, 2014/Loise Esguerra L. Rosana Hoes, RN, [name], CCM. Case Management [place] [phone] No  discharge needs present of time of review.

## 2014-09-25 NOTE — Progress Notes (Signed)
Anticoagulation consult note f/u:  IV Heparin  Assessement:  See 4/6 note from A. [place] for full consult note  0345 HL= 0.56 units/ml (slighly above goal of 0.3-0.5) Drawn after 5 hrs vs 6 hrs not quite Css would anticipate it possibly increasing.  No bleeding or IV interuptions per RN  Plan:  Decrease heparin drip to 1300 units/hr  Recheck HL am  Dorrene German 09/25/2014 4:31 AM

## 2014-09-25 NOTE — Progress Notes (Signed)
ANTICOAGULATION CONSULT NOTE - FOLLOW UP  Pharmacy Consult for Heparin Indication: SVC thrombus  Allergies  Allergen Reactions  . Penicillins Rash    Patient Measurements: Height: 5\' 7"  (170.2 cm) Weight: 124 lb 12.5 oz (56.6 kg) IBW/kg (Calculated) : 61.6 Heparin Dosing Weight: 63 kg   Vital Signs: Temp: 97.2 F (36.2 C) (04/07 1232) Temp Source: Oral (04/07 1232) BP: 113/84 mmHg (04/07 1100) Pulse Rate: 88 (04/07 1100)  Labs:  Recent Labs  09/23/14 0550 09/23/14 1740  09/24/14 0550  09/24/14 2100 09/25/14 0345 09/25/14 1100  HGB 11.7*  --   --  11.4*  --   --  11.5*  --   HCT 35.7*  --   --  35.0*  --   --  35.9*  --   PLT 331  --   --  377  --   --  422*  --   APTT  --  24  --   --   --   --   --   --   HEPARINUNFRC  --   --   < >  --   < > 0.22* 0.56 0.38  CREATININE 0.43*  --   --  0.57  --   --  0.47*  --   < > = values in this interval not displayed.  Estimated Creatinine Clearance: 77.7 mL/min (by C-G formula based on Cr of 0.47).   Medical History: Past Medical History  Diagnosis Date  . Cancer     left bronchioles    Infusions:  . heparin 1,300 Units/hr (09/25/14 0448)    Assessment: 48YOF admitted on 3/24 for pain control due to left-sided chest pain related to cancer pain. Patient has a history of metastatic lung cancer. Patient known to pharmacy from dosing of antibiotics. Of note during this hospitalization, patient with upper GI bleed.  Today porta-cath evaluated via fluoroscopy and showed + SVC thrombosis. Porta-cath removed in radiology today. Pharmacy has been consulted to dose IV heparin.     Heparin level therapeutic at 0.38 with heparin infusing at 1300 units/hr.  CBC ok  SCr stable  No bleeding/complications reported  Goal of Therapy:  Heparin Level (0.3-0.5) - aim for low end due to recent GI bleed Monitor platelets by anticoagulation protocol: Yes   Plan:   Continue IV heparin infusion at 1300 units/hr  Recheck heparin  level in 6 hours to confirm current rate.  Check daily heparin level & CBC  F/u plan for transition to oral anticoagulant  Hershal Coria, PharmD, BCPS Pager: [phone] 09/25/2014 12:55 PM

## 2014-09-26 ENCOUNTER — Inpatient Hospital Stay (HOSPITAL_COMMUNITY): Payer: 59

## 2014-09-26 DIAGNOSIS — G893 Neoplasm related pain (acute) (chronic): Secondary | ICD-10-CM

## 2014-09-26 DIAGNOSIS — I82210 Acute embolism and thrombosis of superior vena cava: Secondary | ICD-10-CM

## 2014-09-26 LAB — HEPARIN LEVEL (UNFRACTIONATED): Heparin Unfractionated: 0.44 IU/mL (ref 0.30–0.70)

## 2014-09-26 LAB — CBC
HEMATOCRIT: 37.7 % (ref 36.0–46.0)
HEMOGLOBIN: 12.2 g/dL (ref 12.0–15.0)
MCH: 27.5 pg (ref 26.0–34.0)
MCHC: 32.4 g/dL (ref 30.0–36.0)
MCV: 85.1 fL (ref 78.0–100.0)
Platelets: 472 10*3/uL — ABNORMAL HIGH (ref 150–400)
RBC: 4.43 MIL/uL (ref 3.87–5.11)
RDW: 16.6 % — AB (ref 11.5–15.5)
WBC: 16.7 10*3/uL — AB (ref 4.0–10.5)

## 2014-09-26 MED ORDER — METOPROLOL TARTRATE 25 MG PO TABS
25.0000 mg | ORAL_TABLET | Freq: Two times a day (BID) | ORAL | Status: DC
  Administered 2014-09-26 – 2014-09-30 (×9): 25 mg via ORAL
  Filled 2014-09-26 (×9): qty 1

## 2014-09-26 MED ORDER — LORAZEPAM 1 MG PO TABS
1.0000 mg | ORAL_TABLET | Freq: Four times a day (QID) | ORAL | Status: DC | PRN
  Administered 2014-09-27 – 2014-09-30 (×4): 1 mg via ORAL
  Filled 2014-09-26 (×4): qty 1

## 2014-09-26 MED ORDER — METOPROLOL TARTRATE 1 MG/ML IV SOLN
5.0000 mg | Freq: Once | INTRAVENOUS | Status: AC
  Administered 2014-09-26: 5 mg via INTRAVENOUS
  Filled 2014-09-26: qty 5

## 2014-09-26 MED ORDER — ENOXAPARIN SODIUM 80 MG/0.8ML ~~LOC~~ SOLN
80.0000 mg | SUBCUTANEOUS | Status: DC
  Administered 2014-09-26 – 2014-09-27 (×2): 80 mg via SUBCUTANEOUS
  Filled 2014-09-26 (×4): qty 0.8

## 2014-09-26 MED ORDER — METRONIDAZOLE 500 MG PO TABS
500.0000 mg | ORAL_TABLET | Freq: Three times a day (TID) | ORAL | Status: DC
  Administered 2014-09-26 – 2014-09-27 (×2): 500 mg via ORAL
  Filled 2014-09-26 (×4): qty 1

## 2014-09-26 MED ORDER — LEVOFLOXACIN 500 MG PO TABS
500.0000 mg | ORAL_TABLET | Freq: Every day | ORAL | Status: AC
  Administered 2014-09-26 – 2014-09-29 (×4): 500 mg via ORAL
  Filled 2014-09-26 (×4): qty 1

## 2014-09-26 MED ORDER — PANTOPRAZOLE SODIUM 40 MG PO TBEC
40.0000 mg | DELAYED_RELEASE_TABLET | Freq: Two times a day (BID) | ORAL | Status: DC
  Administered 2014-09-26 – 2014-09-30 (×9): 40 mg via ORAL
  Filled 2014-09-26 (×11): qty 1

## 2014-09-26 NOTE — Progress Notes (Signed)
Patient XI:HWTUUE Barbarita Hutmacher      DOB: 1965/10/10      KCM:[id number]   Palliative Medicine Team at [facility] Progress Note    Subjective:  Reports having "a bad day", pain is controlled but nausea is worse  I did engage the patient in conversation regarding her serious diagnosis and the limitations of medical interventions to prolong quality life.  She was able to tell me that quality/independance  is a priority to her living  I encouraged her to consider documentation of DNR status knowing the ineffectiveness to prolong quality if life in similar patients.  She tells me she will consider and talk to me on Monday     Filed Vitals:   09/26/14 0537  BP: 111/88  Pulse: 79  Temp: 98.2 F (36.8 C)  Resp: 20   Physical exam: GEN: lethargic, weak  NAD CV: RRR LUNGS: CTAB EXT: warm, no edema  CBC    Component Value Date/Time   WBC 16.7* 09/26/2014 0425   RBC 4.43 09/26/2014 0425   HGB 12.2 09/26/2014 0425   HCT 37.7 09/26/2014 0425   PLT 472* 09/26/2014 0425   MCV 85.1 09/26/2014 0425   MCH 27.5 09/26/2014 0425   MCHC 32.4 09/26/2014 0425   RDW 16.6* 09/26/2014 0425   LYMPHSABS 0.3* 09/18/2014 0300   MONOABS 0.3 09/18/2014 0300   EOSABS 0.0 09/18/2014 0300   BASOSABS 0.0 09/18/2014 0300    CMP     Component Value Date/Time   NA 132* 09/25/2014 0345   K 4.9 09/25/2014 0345   CL 98 09/25/2014 0345   CO2 25 09/25/2014 0345   GLUCOSE 113* 09/25/2014 0345   BUN 11 09/25/2014 0345   CREATININE 0.47* 09/25/2014 0345   CALCIUM 8.8 09/25/2014 0345   PROT 6.7 09/11/2014 0335   ALBUMIN 3.4* 09/11/2014 0335   AST 12 09/11/2014 0335   ALT 8 09/11/2014 0335   ALKPHOS 86 09/11/2014 0335   BILITOT 0.9 09/11/2014 0335   GFRNONAA >90 09/25/2014 0345   GFRAA >90 09/25/2014 0345     Assessment and plan: 49 yo female with PMHx of Stage IV NSCLC (SCC) s/p chemo/XRT admitted with 3/23 with worsening pleuritic chest pain felt 2/2 malignancy. Hospital stay complciated  by Afib w/RVR, Hypoxic Resp Failure, GI bleed. Palliative consulted for goals of care, symptom management as indicated and  emotional support  1. Code Status: FULL  2. GOC:  At this time patient is hopeful for improvement and is open to all offered and available medical interventions to prolong life.  3. Symptom Management:  Cancer Related Pain:  -continue with current medication regime, patient reports adequate pain control   Nausea:  Utilize prn IV Zofran Anxiety: added Ativan 1 mg po/sl every 6 hrs prn   4. Psychosocial/Spiritual: Sat quietly at bedside with patient after conversation offering emotional support and creating space and opportunity for continued conversation.   Patient tells me she is not open to talking but is open for a return visit on Monday   Total Time: 35 minutes >50% of time spent in counseling and coordination of care  Time in 1240-Time out [place] NP  Palliative Medicine Team Team Phone # [phone] Pager [phone]  Discussed with bedside RN Raquel Sarna

## 2014-09-26 NOTE — Progress Notes (Signed)
Pharmacy: Lovenox Indication: SVC Thrombus  Assessment: Pt is currently on Heparin 1300 units/hr and HL this am was 0.44 (HL goal 0.3-0.5 due to recent GI bleeding)  Plan: Will change to Lovenox 80 mg SQ Q24h (~1.5 mg/kg/day) and start 1 hour after heparin gtt is d/ced          Will d/c daily HL but continue daily CBC for now especially with recent GI bleed.          Will f/u Scr          Recommend home regimen of Lovenox 80mg  SQ Q24h.  Garnet Sierras, PharmD 09/26/2014

## 2014-09-26 NOTE — Progress Notes (Signed)
ANTICOAGULATION CONSULT NOTE - FOLLOW UP  Pharmacy Consult for Heparin Indication: SVC thrombus  Allergies  Allergen Reactions  . Penicillins Rash    Patient Measurements: Height: 5\' 7"  (170.2 cm) Weight: 124 lb 8 oz (56.473 kg) IBW/kg (Calculated) : 61.6 Heparin Dosing Weight: 63 kg   Vital Signs: Temp: 98.2 F (36.8 C) (04/08 0537) Temp Source: Oral (04/08 0537) BP: 111/88 mmHg (04/08 0537) Pulse Rate: 79 (04/08 0537)  Labs:  Recent Labs  09/23/14 1740  09/24/14 0550  09/25/14 0345 09/25/14 1100 09/25/14 1700 09/26/14 0425  HGB  --   < > 11.4*  --  11.5*  --   --  12.2  HCT  --   --  35.0*  --  35.9*  --   --  37.7  PLT  --   --  377  --  422*  --   --  472*  APTT 24  --   --   --   --   --   --   --   HEPARINUNFRC  --   < >  --   < > 0.56 0.38 0.43 0.44  CREATININE  --   --  0.57  --  0.47*  --   --   --   < > = values in this interval not displayed.  Estimated Creatinine Clearance: 77.5 mL/min (by C-G formula based on Cr of 0.47).   Medical History: Past Medical History  Diagnosis Date  . Cancer     left bronchioles    Infusions:  . heparin 1,300 Units/hr (09/25/14 1921)    Assessment: 18[facility] admitted on 3/24 for pain control due to left-sided chest pain related to cancer pain. Patient has a history of metastatic lung cancer. Patient known to pharmacy from dosing of antibiotics. Of note during this hospitalization, patient with upper GI bleed.  Today porta-cath evaluated via fluoroscopy and showed + SVC thrombosis. Porta-cath removed in radiology today. Pharmacy has been consulted to dose IV heparin.    Today, 09/26/2014:  Heparin level therapeutic at 0.44 with heparin infusing at 1300 units/hr.  CBC: Hgb and pltc WNL  SCr stable  No bleeding/complications reported  Goal of Therapy:  Heparin Level (0.3-0.5) - aim for low end due to recent GI bleed Monitor platelets by anticoagulation protocol: Yes   Plan:   Continue IV heparin infusion at  1300 units/hr  Check daily heparin level & CBC  Recent GIB so shooting for lower end heparin level goal and monitor for bleeding  F/u plan for transition to long-term anticoagulation - oral vs enoxaparin.  TRH note says will d/w oncology  Doreene Eland, PharmD, BCPS.   Pager: [phone] 09/26/2014 7:57 AM

## 2014-09-26 NOTE — Progress Notes (Signed)
INITIAL NUTRITION ASSESSMENT  DOCUMENTATION CODES Per approved criteria  -Severe malnutrition in the context of chronic illness   INTERVENTION: Continue Resource Breeze po TID, each supplement provides 250 kcal and 9 grams of protein and Ensure Enlive po BID, each supplement provides 350 kcal and 20 grams of protein Continue Megace Encourage PO intake at meals and will assist with selection as needed  NUTRITION DIAGNOSIS: Severe malnutrition related to chronic illness, recurrent lung cancer as evidenced by 12% body weight loss in 8 months, less than 50% needed PO intakes for at least 1 month.   Goal: Pt to meet >/= 90% estimated needs  Monitor:  Meal and supplement intakes,tolerance of Soft diet, weight trends, labs  Reason for Assessment: LOS 15 days, per rounds this AM  49 y.o. female  Admitting Dx: Cancer associated pain  ASSESSMENT: 48 y.o female admitted 09/10/14 with hx of lung cancer status post chemo and radiation which ended 2/16. Notes indicate pt with poor appetite PTA.   Pt not eating much and with Palliative care consult per rounds this AM. The following indicates major occurences since admission: 3/24 Megace ordered to help with anorexia. 3/25 NGT placed with 200 mL dark red drainage; NGT removed the next day. 3/25 Ensure Enlive ordered TID 3/27 Severe erosive esophagitis found on EGD with no active bleeding and thought to be result of previous radiation treatments. 4/5 R IJ power port removed and RUE power PICC in place for PIV access. 4/7 case management note indicates that plan to d/c home 09/28/14.  Spoke with pt who confirms poor appetite PTA which she states has been going on for some time but unable to remember when it began. She states she would try to eat 3 meals/day PTA but that she would often only end up eating "a good dinner" and snack at other times throughout the day if she began to feel hungry. She was unable to recall a typical day of eating as it  relates to what items she ate or how much of the dinner meal she would consume. Pt states she was not drinking nutrition supplements PTA but that she has been sipping on Ensure Enlive and [employer] with no abdominal pain or nausea caused by these items. Pt states UBW PTA was 140 lbs (63.64 kg) but that weight has fluctuated over the past 8 months.   Diet advanced to Soft at 9:50 this AM from FLD. No intakes documented since admission but pt excited about advancement as she has been discouraged by limited options on FLD. She reports her appetite is slightly improved since admission but that she did not consume any breakfast this AM.   Pt likely not meeting needs. Labs reviewed. Physical exam completed with no overt signs of muscle or fat wasting at this time. No edema present.   Height: Ht Readings from Last 1 Encounters:  09/15/14 $RemoveB'5\' 7"'eQQLfzWc$  (1.702 m)    Weight: Wt Readings from Last 1 Encounters:  09/26/14 124 lb 8 oz (56.473 kg)    Ideal Body Weight: 135 lbs (61.36 kg)  % Ideal Body Weight: 92%  Wt Readings from Last 10 Encounters:  09/26/14 124 lb 8 oz (56.473 kg)    Usual Body Weight: 140 lbs (63.64 kg)  % Usual Body Weight: 88%  BMI:  Body mass index is 19.49 kg/(m^2).  Estimated Nutritional Needs: Kcal: 1700-2000 Protein: 85-105 grams Fluid: 2L/day  Skin: WDL  Diet Order: DIET SOFT Room service appropriate?: Yes; Fluid consistency:: Thin  EDUCATION NEEDS:  Education needs met at this time   Intake/Output Summary (Last 24 hours) at 09/26/14 1100 Last data filed at 09/26/14 4287  Gross per 24 hour  Intake   1102 ml  Output    750 ml  Net    352 ml    Last BM: no documentation at this time   Labs:   Recent Labs Lab 09/23/14 0550 09/24/14 0550 09/25/14 0345  NA 132* 130* 132*  K 4.5 4.9 4.9  CL 97 96 98  CO2 $Re'26 29 25  'yKU$ BUN $R'9 12 11  'xu$ CREATININE 0.43* 0.57 0.47*  CALCIUM 8.5 8.5 8.8  GLUCOSE 129* 205* 113*    CBG (last 3)  No results for  input(s): GLUCAP in the last 72 hours.  Scheduled Meds: . acidophilus  1 capsule Oral Daily  . antiseptic oral rinse  7 mL Mouth Rinse BID  . antiseptic oral rinse  7 mL Mouth Rinse q12n4p  . chlorhexidine  15 mL Mouth Rinse BID  . dexamethasone  8 mg Intravenous Q24H  . dextromethorphan-guaiFENesin  1 tablet Oral BID  . feeding supplement (ENSURE ENLIVE)  237 mL Oral TID BM  . feeding supplement (RESOURCE BREEZE)  1 Container Oral TID BM  . fentaNYL  100 mcg Transdermal Q72H  . ipratropium  0.5 mg Nebulization Q6H  . levalbuterol  0.63 mg Nebulization 4 times per day  . levofloxacin  500 mg Oral Daily  . megestrol  400 mg Oral BID  . metoprolol tartrate  25 mg Oral BID  . metroNIDAZOLE  500 mg Oral 3 times per day  . morphine  30 mg Oral Q12H  . nicotine  14 mg Transdermal Daily  . ondansetron  4 mg Oral Q6H   Or  . ondansetron (ZOFRAN) IV  4 mg Intravenous Q6H  . pantoprazole  40 mg Oral BID  . polyethylene glycol  17 g Oral Daily  . senna-docusate  1 tablet Oral QHS  . sodium chloride  3 mL Intravenous Q12H    Continuous Infusions: . heparin 1,300 Units/hr (09/25/14 1921)    Past Medical History  Diagnosis Date  . Cancer     left bronchioles    Past Surgical History  Procedure Laterality Date  . Esophagogastroduodenoscopy N/A 09/14/2014    Procedure: ESOPHAGOGASTRODUODENOSCOPY (EGD);  Surgeon: Teena Irani, MD;  Location: [facility];  Service: Endoscopy;  Laterality: N/A;    Jarome Matin, RD, LDN Inpatient Clinical Dietitian Pager # [phone] After hours/weekend pager # [phone]

## 2014-09-26 NOTE — Progress Notes (Signed)
Visited with patient for scheduled BD therapy. At this time, she expressed much frustration and discomfort with her oxygen mask and requests she go back to the one "in the nose". Moderate amounts of anxiety also noted. All VS stable. Air connection previously repaired. BD administered via nebulizer mask and patient continues to complain of dryness and the inability to wear her glasses to see the television and remotes. RT placed back on HFNC with .50 FiO2 and a flow of 15 liters per minute, per patient stated comfort. RN aware of this change. RT will continue to follow.

## 2014-09-26 NOTE — Progress Notes (Signed)
[facility] Progress Note   [name] ZOX:[id number] DOB: [birth year] DOA: 09/10/2014  PCP: No primary care provider on file.  Brief narrative: [name] is a 49 y.o. female with Lung cancer who was treated for it in [place]. She was told recently after a PET scan that she had a mass in her lung and needed further treatment but she decided to move here with her brother to help care for her. Her records were sent to Dr Julien Nordmann. She presented to the hospital with ongoing severe left chest pain and was admitted for pain control. 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 Patient also developed A. fib with RVR, due to acute illness. Seen by cardiology. Placed on Amiodarone and back to normal sinus rhythm. Not anti-coagulation candidate. Continues to have poor respiratory status giving her pneumonia. Pulmonary service following. Patient had Port-A-Cath fluoroscopy to evaluate for malfunction, there was evidence of SVC thrombosis. Port-A-Cath was subsequently removed. She was started on IV heparin.  Subjective: Patient continues to feel better. Her appetite is improving. She had a brief episode of rapid atrial fibrillation this morning, which resolved after she was given metoprolol.  Assessment/Plan:  Acute hypoxic resp failure -  In the setting of poor pulmonary function at baseline secondary to lung cancer, previous lung radiation, and COPD (no oxygen requirement though), HCAP. -  continue with nebulizer treatment. - On broad-spectrum antibiotics for aspiration pneumonia. Since she is clinically improved. Will de-escalate antibiotics. - Initially on IV Lasix for volume overload, currently stopped giving no further evidence of volume overload  - CT Chest angiogram negative for PE. - Was requiring BiPAP at bedtime. Transitioned from Ventimask to  high flow nasal cannula so she can tolerate eating and taking oral medications. Continue to titrate down to nasal cannula if possible. - Has loculated pleural effusion, discussed with IR, unable to drain by ultrasound. Previous rounding MD also discussed with CT surgery. They don't think she would be a candidate for any intervention at this point. Pulmonology has also discussed with the specialists and again no intervention to be done at this time.   SVC thrombosis. - Patient had malfunctioning Port-A-Cath, fluoroscopy showing SVC thrombosis. - Her Port-A-Cath was removed. - Risks and benefits of anticoagulation were discussed with patient. Subsequently intravenous heparin was initiated after discussions with GI. Monitor closely for signs of bleeding. Hemoglobin is stable. - Discussed with Dr. Julien Nordmann. Ideally she should be on Lovenox at home for this thrombosis as she has cancer. I have requested case management to find out if this is possible. If so, we will change her to Lovenox. If not, we will have to transition her to oral anticoagulation.   Cancer associated pain - She has been on narcotics for months for pain starting at her left lower rib cage and radiating upwards towards axilla- the pain was severe even prior to coming to [facility] per brother and patient.  - Has a lesion in left post 9th rib which is the only imaging finding that could be casing her pain - Pain is much better controlled , palliative care input appreciated. - Started on decadron to help with chest wall metastasis. Consider changing to oral tomorrow.  HCAP (aspiration pneumonia) with loculated pleural effusion - may have aspirated on vomitus (hematemesis), started on HCAP coverage (used Vanc/ Levaquin and Flagyl) on 3/27 as symptoms started on her 5th day in the hospital  -  Patient had deterioration of respiratory status 3/29, CT chest showing significant progression of her pneumonia, so currently on  vancomycin/cefepime/Flagyl - Continue with pulmonary toilet and nebs as needed. - Clinically she is improving. Will de-escalate antibiotics to Levaquin and oral Flagyl.   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 Due to concerns for pulmonary toxicity. Amiodarone discontinued. Cardiology was following. She did have a recurrence of atrial fibrillation this morning, but it resolved with the IV metoprolol. She is now in sinus rhythm. We will place her on a low-dose beta blocker orally.  - ECHO does not reveal underlying cardiac issues - [place] Vasc score of 1.  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 - CT showing chest wall involvement - Dr Julien Nordmann to see her as OP.  - Persistent leukocytosis, likely secondary to steroids.  Hypokalemia - Repleted  Anorexia-  - Ensure and Megace. Advance diet.  Ongoing smoking - Nicotine patch  DVT prophylaxis:  On IV heparin Code Status: full code Family Communication: discussed with patient Disposition Plan: Currently on telemetry. Involve PT and OT. Taper oxygen as much as possible.   Consultants: GI, pulmonary , cardiology   Procedures:  1. EGD: very severe esophagitis possibly due to previous radiation combined with reflux  2. ECHO:  Left ventricle: LVEF is grossly normal at approximately 60%.Diffiuclt to fully evaluate regional wall motion as endocardiumis difficult to see well. The  cavity size was normal. Wallthickness was normal. - Pulmonary arteries: PA peak pressure: 34 mm Hg (S).  Antibiotics: Anti-infectives    Start     Dose/Rate Route Frequency Ordered Stop   09/26/14 1400  metroNIDAZOLE (FLAGYL) tablet 500 mg     500 mg Oral 3 times per day 09/26/14 0959     09/26/14 1000  levofloxacin (LEVAQUIN) tablet 500 mg     500 mg Oral Daily 09/26/14 0959     09/18/14 2000  vancomycin (VANCOCIN) IVPB 1000 mg/200 mL premix  Status:  Discontinued     1,000 mg 200 mL/hr over 60 Minutes Intravenous Every 8 hours 09/18/14 1918 09/26/14 0959   09/18/14 1400  metroNIDAZOLE (FLAGYL) IVPB 500 mg  Status:  Discontinued     500 mg 100 mL/hr over 60 Minutes Intravenous Every 8 hours 09/18/14 1305 09/26/14 0959   09/17/14 0400  vancomycin (VANCOCIN) IVPB 750 mg/150 ml premix  Status:  Discontinued     750 mg 150 mL/hr over 60 Minutes Intravenous Every 8 hours 09/17/14 0159 09/18/14 1918   09/17/14 0300  ceFEPIme (MAXIPIME) 1 g in dextrose 5 % 50 mL IVPB  Status:  Discontinued     1 g 100 mL/hr over 30 Minutes Intravenous 3 times per day 09/17/14 0154 09/26/14 0959   09/15/14 1000  levofloxacin (LEVAQUIN) tablet 750 mg  Status:  Discontinued     750 mg Oral Daily 09/15/14 0751 09/18/14 1201   09/15/14 0800  metroNIDAZOLE (FLAGYL) tablet 500 mg  Status:  Discontinued  500 mg Oral 3 times per day 09/15/14 0751 09/18/14 1201   09/14/14 1000  metroNIDAZOLE (FLAGYL) IVPB 500 mg  Status:  Discontinued     500 mg 100 mL/hr over 60 Minutes Intravenous Every 8 hours 09/14/14 0845 09/15/14 0751   09/14/14 1000  levofloxacin (LEVAQUIN) IVPB 750 mg  Status:  Discontinued     750 mg 100 mL/hr over 90 Minutes Intravenous Every 24 hours 09/14/14 0847 09/15/14 0751   09/14/14 0900  vancomycin (VANCOCIN) 500 mg in sodium chloride 0.9 % 100 mL IVPB  Status:  Discontinued     500 mg 100 mL/hr over 60 Minutes Intravenous Every 8 hours 09/14/14 0850 09/15/14 1301      Objective: Filed  Weights   09/24/14 0500 09/25/14 0600 09/26/14 0537  Weight: 57.1 kg (125 lb 14.1 oz) 56.6 kg (124 lb 12.5 oz) 56.473 kg (124 lb 8 oz)    Intake/Output Summary (Last 24 hours) at 09/26/14 1000 Last data filed at 09/26/14 5277  Gross per 24 hour  Intake   1102 ml  Output    750 ml  Net    352 ml     Vitals Filed Vitals:   09/25/14 1951 09/25/14 2116 09/26/14 0537 09/26/14 0828  BP:  113/78 111/88   Pulse:  84 79   Temp:  98.1 F (36.7 C) 98.2 F (36.8 C)   TempSrc:  Oral Oral   Resp:  18 20   Height:      Weight:   56.473 kg (124 lb 8 oz)   SpO2: 97% 96% 98% 89%    Exam:  General:  Pt is alert, in no distress. on high flow nasal cannula  HEENT: No icterus, No thrush  Cardiovascular: S1/S2 No murmur  Respiratory: No wheezing. No rhonchi. Improved air entry bilaterally  Abdomen: Soft, +Bowel sounds, non tender, non distended, no guarding  MSK: No LE edema, cyanosis or clubbing  Data Reviewed: Basic Metabolic Panel:  Recent Labs Lab 09/21/14 0502 09/22/14 0405 09/23/14 0550 09/24/14 0550 09/25/14 0345  NA 136 136 132* 130* 132*  K 3.6 3.4* 4.5 4.9 4.9  CL 100 98 97 96 98  CO2 29 29 26 29 25   GLUCOSE 108* 110* 129* 205* 113*  BUN 8 8 9 12 11   CREATININE 0.39* 0.39* 0.43* 0.57 0.47*  CALCIUM 8.3* 8.0* 8.5 8.5 8.8    CBC:  Recent Labs Lab 09/22/14 0405 09/23/14 0550 09/24/14 0550 09/25/14 0345 09/26/14 0425  WBC 15.3* 19.1* 15.6* 16.0* 16.7*  HGB 10.8* 11.7* 11.4* 11.5* 12.2  HCT 33.6* 35.7* 35.0* 35.9* 37.7  MCV 84.4 84.6 85.2 85.1 85.1  PLT 222 331 377 422* 472*    BNP (last 3 results)  Recent Labs  09/22/14 0405  BNP 301.5*    Recent Results (from the past 240 hour(s))  MRSA PCR Screening     Status: None   Collection Time: 09/18/14  4:00 AM  Result Value Ref Range Status   MRSA by PCR NEGATIVE NEGATIVE Final    Comment:        The GeneXpert MRSA Assay (FDA approved for NASAL specimens only), is one component of  a comprehensive MRSA colonization surveillance program. It is not intended to diagnose MRSA infection nor to guide or monitor treatment for MRSA infections.      Scheduled Meds:  Scheduled Meds: . acidophilus  1 capsule Oral Daily  . antiseptic oral rinse  7 mL Mouth Rinse BID  . antiseptic oral rinse  7 mL Mouth Rinse q12n4p  . chlorhexidine  15 mL Mouth Rinse BID  . dexamethasone  8 mg Intravenous Q24H  . dextromethorphan-guaiFENesin  1 tablet Oral BID  . feeding supplement (ENSURE ENLIVE)  237 mL Oral TID BM  . feeding supplement (RESOURCE BREEZE)  1 Container Oral TID BM  . fentaNYL  100 mcg Transdermal Q72H  . ipratropium  0.5 mg Nebulization Q6H  . levalbuterol  0.63 mg Nebulization 4 times per day  . levofloxacin  500 mg Oral Daily  . megestrol  400 mg Oral BID  . metoprolol tartrate  25 mg Oral BID  . metroNIDAZOLE  500 mg Oral 3 times per day  . morphine  30 mg Oral Q12H  . nicotine  14 mg Transdermal Daily  . ondansetron  4 mg Oral Q6H   Or  . ondansetron (ZOFRAN) IV  4 mg Intravenous Q6H  . pantoprazole  40 mg Oral BID  . polyethylene glycol  17 g Oral Daily  . senna-docusate  1 tablet Oral QHS  . sodium chloride  3 mL Intravenous Q12H   Continuous Infusions: . heparin 1,300 Units/hr (09/25/14 1921)    Bonnielee Haff, MD [phone]  09/26/2014, 10:00 AM  LOS: 15 days   [facility] Office  [phone] Pager - Text Page per [email or web address]  If 7PM-7AM, please contact night-coverage [email or web address]

## 2014-09-26 NOTE — Progress Notes (Signed)
ANTIBIOTIC CONSULT NOTE - FOLLOW UP  Pharmacy Consult for Cefepime/Vancomycin Indication: pneumonia  Allergies  Allergen Reactions  . Penicillins Rash    Patient Measurements: Height: 5\' 7"  (170.2 cm) Weight: 124 lb 8 oz (56.473 kg) IBW/kg (Calculated) : 61.6  Vital Signs: Temp: 98.2 F (36.8 C) (04/08 0537) Temp Source: Oral (04/08 0537) BP: 111/88 mmHg (04/08 0537) Pulse Rate: 79 (04/08 0537) Intake/Output from previous day: 04/07 0701 - 04/08 0700 In: 1102 [I.V.:52; IV Piggyback:1050] Out: 750 [Urine:750] Intake/Output from this shift:    Labs:  Recent Labs  09/24/14 0550 09/25/14 0345 09/26/14 0425  WBC 15.6* 16.0* 16.7*  HGB 11.4* 11.5* 12.2  PLT 377 422* 472*  CREATININE 0.57 0.47*  --    Estimated Creatinine Clearance: 77.5 mL/min (by C-G formula based on Cr of 0.47).  Recent Labs  09/25/14 0345  VANCOTROUGH 17.5     Microbiology: Recent Results (from the past 720 hour(s))  MRSA PCR Screening     Status: None   Collection Time: 09/12/14 10:27 PM  Result Value Ref Range Status   MRSA by PCR NEGATIVE NEGATIVE Final    Comment:        The GeneXpert MRSA Assay (FDA approved for NASAL specimens only), is one component of a comprehensive MRSA colonization surveillance program. It is not intended to diagnose MRSA infection nor to guide or monitor treatment for MRSA infections.   MRSA PCR Screening     Status: None   Collection Time: 09/18/14  4:00 AM  Result Value Ref Range Status   MRSA by PCR NEGATIVE NEGATIVE Final    Comment:        The GeneXpert MRSA Assay (FDA approved for NASAL specimens only), is one component of a comprehensive MRSA colonization surveillance program. It is not intended to diagnose MRSA infection nor to guide or monitor treatment for MRSA infections.      Assessment: 42 yoF with h/o lung CA s/p chemo admitted with chest pain and dyspnea, developed vomiting with hematemesis. Pharmacy consulted to dose cefepime  and vancomycin for suspected aspiration pneumonia in this immunocompromised patient.  CTa and CXR showed new multifocal PNA, Multiple destructive left rib metastases  3/27 >> Vanco >> 3/28 3/27 >> Levaquin >> 3/31 3/27 >> Flagyl >>  3/30 >> Cefepime >> 3/30 >> Vancomycin >>  Dose changes/drug level info: 3/31 VT at 1830 = 11.2 on 750mg  q8h (drawn 6.5hr after dose) - increase 1g q8h 4/1 1100 VT = 17 on 1g q8h (prior to 6th dose) 4/6 1100 VT = RN informed me that she drew VT after 1g dose given, ignore level 4/6 1900 VT = 17.5  On 1gm IV q8h  Goal of Therapy:  Vancomycin trough level 15-20 mcg/ml  Doses adjusted per renal function Eradication of infection  Plan:  D#10 vanco/cefepime, D#13 metronidazole  Consider stopping antibiotics if clinically appropriate.   Vancomycin and Cefepime doses appropriate as currently ordered  Follow renal function and recheck Vancomycin trough as clinically warranted (at least q7days)  Doreene Eland, PharmD, BCPS.   Pager: [phone] 09/26/2014 8:03 AM

## 2014-09-26 NOTE — Care Management Note (Addendum)
Page 1 of 2   09/30/2014     3:31:17 PM CARE MANAGEMENT NOTE 09/30/2014  Patient:  [name], [name]   Account Number:  [id number]  Date Initiated:  09/15/2014  Documentation initiated by:  [name],[name]  Subjective/Objective Assessment:   gi bld with chest pain -tropnoin neg/ hgb stable edg severe corrorisive esophagitis with necrosis Lynwood Dawley     Action/Plan:   home when stable   Anticipated DC Date:  09/30/2014   Anticipated DC Plan:  [place]  In-house referral  NA      DC Planning Services  CM consult      PAC Choice  NA   Choice offered to / List presented to:  NA      DME agency  NA        [place] agency  NA   Status of service:  Completed, signed off Medicare Important Message given?   (If response is "NO", the following Medicare IM given date fields will be blank) Date Medicare IM given:   Medicare IM given by:   Date Additional Medicare IM given:   Additional Medicare IM given by:    Discharge Disposition:  [place]  Per UR Regulation:  Reviewed for med. necessity/level of care/duration of stay  If discussed at [place] of Stay Meetings, dates discussed:   09/30/2014    Comments:  09/30/14 [name] RN [name] NCM [phone] after several conversations w/brother-[name], his spouse [name] about insurance co-oon benefit issues,outcome-d/c [facility]-SNF on LOG.Patient/family aware & agree.  09/29/14 [name] RN [name] NCM [phone] Spoke to patient's brother [name] c#[phone]-patient from [place], [place] living w/brother & his wife(recovering from leg wound issues,now going through otpt rehab).Palliative following-to assist w/symptom mgmnt,code status, & disposition)Spoke to insurance co-liason [place] tel [phone], will fax update to [phone]-states patient has no OON benefits,informed brother to contact patient's ins co cust svc tel# to ask for another insurance that would be in network.Rick voiced  understanding.OT-SNF, await PT recommendation.CSW notified of status.If home w/HHC whick may be more likely will need 02 sats checked & documented, & home 02 order for dme co to verify insurance.Will also have LTAC screen performed to offer all possible options if covered by insurance.  ---09/26/2014 1435 by [name]--- S/W [facility] @ [employer] [facility] # [id number] ** THIS PHONE WAS NOT ON INS CARD **  REF # FOR CALL : [id number]   *** LOVENOX 85 MG SQ DAILY- NOT COVER ***  * ENOXAPARIN  80 MG SQ  DAILY COVER- YES CO-PAY- 100 %   [facility] LIMITED OF 28 PER MO TIER- 3 DRUG PRIOR APPROVAL -NO PHARMACY- [facility], [facility] AND [facility]  MAIL ORDER-  FOR 3 MO - 100%    ---09/26/2014 1139 by [name]--- Patient will need Lovenox 85 mg SQ daily at discharge for treatment of SVC thrombosis. What is out of pocket expense for the patient?   [id number]/[name]: [phone] Chart reviewed for patient needs and discharge planning. Patient moaning out loud is discomfort, pain meds being given, increased confusion is present.  [id number] [name], [name], [name]: [phone] Case management. Chart reviewed for discharge planning and present needs. Discharge needs: none present at time of review. Remains on bipap with FI02 of 60-98% to maintain 02 level to 92%  September 18, 2014/[name], RN, [name], CCM. Case Management [place] [phone] No discharge needs present of time of review. Patient transferred down to  sdu due to hypoxia and requiring 50-100% o2 via aerosol face mask.  High risk of intubation.  September 15, 2014/[name], RN, [name], CCM. Case Management [place] [phone] No discharge needs present of time of review.

## 2014-09-27 DIAGNOSIS — R110 Nausea: Secondary | ICD-10-CM

## 2014-09-27 DIAGNOSIS — Z86718 Personal history of other venous thrombosis and embolism: Secondary | ICD-10-CM

## 2014-09-27 DIAGNOSIS — Z7189 Other specified counseling: Secondary | ICD-10-CM

## 2014-09-27 DIAGNOSIS — E43 Unspecified severe protein-calorie malnutrition: Secondary | ICD-10-CM | POA: Insufficient documentation

## 2014-09-27 LAB — CBC
HCT: 38.9 % (ref 36.0–46.0)
HEMOGLOBIN: 12.7 g/dL (ref 12.0–15.0)
MCH: 27.8 pg (ref 26.0–34.0)
MCHC: 32.6 g/dL (ref 30.0–36.0)
MCV: 85.1 fL (ref 78.0–100.0)
Platelets: 497 10*3/uL — ABNORMAL HIGH (ref 150–400)
RBC: 4.57 MIL/uL (ref 3.87–5.11)
RDW: 16.7 % — ABNORMAL HIGH (ref 11.5–15.5)
WBC: 20.5 10*3/uL — ABNORMAL HIGH (ref 4.0–10.5)

## 2014-09-27 LAB — COMPREHENSIVE METABOLIC PANEL
ALBUMIN: 3.2 g/dL — AB (ref 3.5–5.2)
ALK PHOS: 81 U/L (ref 39–117)
ALT: 6 U/L (ref 0–35)
AST: 14 U/L (ref 0–37)
Anion gap: 7 (ref 5–15)
BUN: 18 mg/dL (ref 6–23)
CO2: 22 mmol/L (ref 19–32)
CREATININE: 0.51 mg/dL (ref 0.50–1.10)
Calcium: 9 mg/dL (ref 8.4–10.5)
Chloride: 98 mmol/L (ref 96–112)
GFR calc Af Amer: >90 mL/min (ref >90)
Glucose, Bld: 103 mg/dL — ABNORMAL HIGH (ref 70–99)
Potassium: 4.6 mmol/L (ref 3.5–5.1)
Sodium: 127 mmol/L — ABNORMAL LOW (ref 135–145)
Total Bilirubin: 0.7 mg/dL (ref 0.3–1.2)
Total Protein: 7.2 g/dL (ref 6.0–8.3)

## 2014-09-27 MED ORDER — SODIUM CHLORIDE 0.9 % IV SOLN
INTRAVENOUS | Status: AC
  Administered 2014-09-27: 12:00:00 via INTRAVENOUS

## 2014-09-27 MED ORDER — IPRATROPIUM BROMIDE 0.02 % IN SOLN
0.5000 mg | Freq: Four times a day (QID) | RESPIRATORY_TRACT | Status: DC
  Administered 2014-09-27 – 2014-09-30 (×7): 0.5 mg via RESPIRATORY_TRACT
  Filled 2014-09-27 (×13): qty 2.5

## 2014-09-27 MED ORDER — DEXAMETHASONE 4 MG PO TABS
8.0000 mg | ORAL_TABLET | Freq: Three times a day (TID) | ORAL | Status: DC
  Administered 2014-09-27 – 2014-09-30 (×10): 8 mg via ORAL
  Filled 2014-09-27 (×12): qty 2

## 2014-09-27 MED ORDER — ALBUTEROL SULFATE (2.5 MG/3ML) 0.083% IN NEBU: 2.5000 mg | INHALATION_SOLUTION | RESPIRATORY_TRACT | Status: DC | PRN

## 2014-09-27 MED ORDER — LEVALBUTEROL HCL 0.63 MG/3ML IN NEBU
0.6300 mg | INHALATION_SOLUTION | Freq: Four times a day (QID) | RESPIRATORY_TRACT | Status: DC
  Administered 2014-09-27 – 2014-09-30 (×7): 0.63 mg via RESPIRATORY_TRACT
  Filled 2014-09-27 (×12): qty 3

## 2014-09-27 NOTE — Progress Notes (Signed)
[facility] Progress Note   [name] ZSW:[id number] DOB: Sep 24, 1965 DOA: 09/10/2014  PCP: No primary care provider on file.  Brief narrative: [name] is a 49 y.o. female with Lung cancer who was treated for it in [place]. She was told recently after a PET scan that she had a mass in her lung and needed further treatment but she decided to move here with her brother to help care for her. Her records were sent to Dr Julien Nordmann. She presented to the hospital with ongoing severe left chest pain and was admitted for pain control. 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 Patient also developed A. fib with RVR, due to acute illness. Seen by cardiology. Placed on Amiodarone and back to normal sinus rhythm. Not anti-coagulation candidate. Continues to have poor respiratory status giving her pneumonia. Pulmonary service following. Patient had Port-A-Cath fluoroscopy to evaluate for malfunction, there was evidence of SVC thrombosis. Port-A-Cath was subsequently removed. She was started on IV heparin. Subsequently switched over to subcutaneous Lovenox.  Subjective: Patient experiences nausea yesterday. Otherwise, she feels fairly well. Pain is under control. No chest pain. No shortness of breath.   Assessment/Plan:  Acute hypoxic resp failure -  In the setting of poor pulmonary function at baseline secondary to lung cancer, previous lung radiation, and COPD (no oxygen requirement though), HCAP. - continue with nebulizer treatment. - She was on broad-spectrum antibiotics for aspiration pneumonia. Antibiotics were de-escalate it to Levaquin and metronidazole 4/8. She has significant nausea which could be due to the metronidazole. This will be discontinued as she has completed more than 7 days of treatment.  - Initially on IV Lasix for volume overload,  currently stopped giving no further evidence of volume overload  - CT Chest angiogram negative for PE. - Was requiring BiPAP at bedtime. Transitioned from Ventimask to high flow nasal cannula so she can tolerate eating and taking oral medications. Continue to titrate down to nasal cannula if possible. - Has loculated pleural effusion, discussed with IR, unable to drain by ultrasound. Previous rounding MD also discussed with CT surgery. They don't think she would be a candidate for any intervention at this point. Pulmonology has also discussed with the specialists and again no intervention to be done at this time.   SVC thrombosis. - Patient had malfunctioning Port-A-Cath, fluoroscopy showing SVC thrombosis. - Her Port-A-Cath was removed. - Risks and benefits of anticoagulation were discussed with patient. Subsequently intravenous heparin was initiated after discussions with GI. Monitor closely for signs of bleeding. Hemoglobin is stable. No bleeding noted. - Discussed with Dr. Julien Nordmann. Ideally she should be on Lovenox at home for this thrombosis as she has cancer. Enoxaparin can be arranged for home use. So she was switched over to the same yesterday.   Cancer associated pain - She has been on narcotics for months for pain starting at her left lower rib cage and radiating upwards towards axilla- the pain was severe even prior to coming to [facility] per brother and patient.  - Has a lesion in left post 9th rib which is the only imaging finding that could be casing her pain - Pain is much better controlled , palliative care input appreciated. - Started on decadron to help with chest wall metastasis. Change to oral today. Could start tapering soon as well.  HCAP (aspiration pneumonia) with loculated pleural effusion - may have aspirated on vomitus (hematemesis), started on  HCAP coverage (used Vanc/ Levaquin and Flagyl) on 3/27 as symptoms started on her 5th day in the hospital  - Patient had  deterioration of respiratory status 3/29, CT chest showing significant progression of her pneumonia, so currently on vancomycin/cefepime/Flagyl - Continue with pulmonary toilet and nebs as needed. - Clinically she is improving. Changed to Levaquin and metronidazole yesterday. Since she has significant nausea will discontinue metronidazole.    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 Abdominal film did not suggest obstruction. Flagyl has been discontinued.  A-fib with RVR - Initially on Cardizem drip, then amiodarone drip, transitioned to oral amiodarone, back to normal sinus rhythm. Due to concerns for pulmonary toxicity Amiodarone discontinued. Cardiology was following. She did have a recurrence of atrial fibrillation on 4/8, but it resolved with the IV metoprolol. She is now in sinus rhythm. She was placed on oral metoprolol. - ECHO does not reveal underlying cardiac issues - [place] Vasc score of 1.  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 - CT showing chest wall involvement - Dr Julien Nordmann to eventually see her as OP. But patient has had a prolonged hospital stay. So he will see her today. - Persistent leukocytosis, likely secondary to steroids.  Hyponatremia Likely due to some degree of hypovolemia. We will give her normal saline infusion. SIADH is also a possibility.  Anorexia-  - Ensure and Megace. Advance diet.  Ongoing smoking - Nicotine patch  Severe protein calorie malnutrition Due to cancer. Continue  nutritional supplements.  DVT prophylaxis:  On Lovenox Code Status: full code Family Communication: discussed with patient Disposition Plan:  PT and OT. Taper oxygen as much as possible. Mobilize.   Consultants: GI, pulmonary , cardiology, oncology  Procedures:  1. EGD: very severe esophagitis possibly due to previous radiation combined with reflux  2. ECHO:  Left ventricle: LVEF is grossly normal at approximately 60%.Diffiuclt to fully evaluate regional wall motion as endocardiumis difficult to see well. The cavity size was normal. Wallthickness was normal. - Pulmonary arteries: PA peak pressure: 34 mm Hg (S).  Antibiotics: Anti-infectives    Start     Dose/Rate Route Frequency Ordered Stop   09/26/14 1400  metroNIDAZOLE (FLAGYL) tablet 500 mg  Status:  Discontinued     500 mg Oral 3 times per day 09/26/14 0959 09/27/14 0916   09/26/14 1100  levofloxacin (LEVAQUIN) tablet 500 mg     500 mg Oral Daily 09/26/14 0959     09/18/14 2000  vancomycin (VANCOCIN) IVPB 1000 mg/200 mL premix  Status:  Discontinued     1,000 mg 200 mL/hr over 60 Minutes Intravenous Every 8 hours 09/18/14 1918 09/26/14 0959   09/18/14 1400  metroNIDAZOLE (FLAGYL) IVPB 500 mg  Status:  Discontinued     500 mg 100 mL/hr over 60 Minutes Intravenous Every 8 hours 09/18/14 1305 09/26/14 0959   09/17/14 0400  vancomycin (VANCOCIN) IVPB 750 mg/150 ml premix  Status:  Discontinued     750 mg 150 mL/hr over 60 Minutes Intravenous Every 8 hours 09/17/14  0159 09/18/14 1918   09/17/14 0300  ceFEPIme (MAXIPIME) 1 g in dextrose 5 % 50 mL IVPB  Status:  Discontinued     1 g 100 mL/hr over 30 Minutes Intravenous 3 times per day 09/17/14 0154 09/26/14 0959   09/15/14 1000  levofloxacin (LEVAQUIN) tablet 750 mg  Status:  Discontinued     750 mg Oral Daily 09/15/14 0751 09/18/14 1201   09/15/14 0800  metroNIDAZOLE (FLAGYL) tablet 500 mg  Status:  Discontinued     500 mg Oral 3 times per day 09/15/14 0751 09/18/14 1201    09/14/14 1000  metroNIDAZOLE (FLAGYL) IVPB 500 mg  Status:  Discontinued     500 mg 100 mL/hr over 60 Minutes Intravenous Every 8 hours 09/14/14 0845 09/15/14 0751   09/14/14 1000  levofloxacin (LEVAQUIN) IVPB 750 mg  Status:  Discontinued     750 mg 100 mL/hr over 90 Minutes Intravenous Every 24 hours 09/14/14 0847 09/15/14 0751   09/14/14 0900  vancomycin (VANCOCIN) 500 mg in sodium chloride 0.9 % 100 mL IVPB  Status:  Discontinued     500 mg 100 mL/hr over 60 Minutes Intravenous Every 8 hours 09/14/14 0850 09/15/14 1301      Objective: Filed Weights   09/24/14 0500 09/25/14 0600 09/26/14 0537  Weight: 57.1 kg (125 lb 14.1 oz) 56.6 kg (124 lb 12.5 oz) 56.473 kg (124 lb 8 oz)    Intake/Output Summary (Last 24 hours) at 09/27/14 0920 Last data filed at 09/27/14 0441  Gross per 24 hour  Intake    204 ml  Output   1075 ml  Net   -871 ml     Vitals Filed Vitals:   09/26/14 2139 09/27/14 0251 09/27/14 0440 09/27/14 0739  BP: 108/75  112/72   Pulse: 76  66   Temp: 97.7 F (36.5 C)  98 F (36.7 C)   TempSrc: Oral  Oral   Resp: 20  20   Height:      Weight:      SpO2: 96% 96% 95% 94%    Exam:  General:  Pt is alert, in no distress. on high flow nasal cannula  Cardiovascular: S1/S2 normal, regular. No murmur  Respiratory: No wheezing. No rhonchi. Improved air entry bilaterally  Abdomen: Soft, +Bowel sounds, non tender, non distended, no guarding  MSK: No LE edema, cyanosis or clubbing  Data Reviewed: Basic Metabolic Panel:  Recent Labs Lab 09/22/14 0405 09/23/14 0550 09/24/14 0550 09/25/14 0345 09/27/14 0550  NA 136 132* 130* 132* 127*  K 3.4* 4.5 4.9 4.9 4.6  CL 98 97 96 98 98  CO2 29 26 29 25 22   GLUCOSE 110* 129* 205* 113* 103*  BUN 8 9 12 11 18   CREATININE 0.39* 0.43* 0.57 0.47* 0.51  CALCIUM 8.0* 8.5 8.5 8.8 9.0    CBC:  Recent Labs Lab 09/23/14 0550 09/24/14 0550 09/25/14 0345 09/26/14 0425 09/27/14 0550  WBC 19.1* 15.6* 16.0* 16.7*  20.5*  HGB 11.7* 11.4* 11.5* 12.2 12.7  HCT 35.7* 35.0* 35.9* 37.7 38.9  MCV 84.6 85.2 85.1 85.1 85.1  PLT 331 377 422* 472* 497*    BNP (last 3 results)  Recent Labs  09/22/14 0405  BNP 301.5*    Recent Results (from the past 240 hour(s))  MRSA PCR Screening     Status: None   Collection Time: 09/18/14  4:00 AM  Result Value Ref Range Status   MRSA by PCR NEGATIVE NEGATIVE Final    Comment:  The GeneXpert MRSA Assay (FDA approved for NASAL specimens only), is one component of a comprehensive MRSA colonization surveillance program. It is not intended to diagnose MRSA infection nor to guide or monitor treatment for MRSA infections.      Scheduled Meds:  Scheduled Meds: . acidophilus  1 capsule Oral Daily  . antiseptic oral rinse  7 mL Mouth Rinse BID  . antiseptic oral rinse  7 mL Mouth Rinse q12n4p  . chlorhexidine  15 mL Mouth Rinse BID  . dexamethasone  8 mg Oral 3 times per day  . dextromethorphan-guaiFENesin  1 tablet Oral BID  . enoxaparin (LOVENOX) injection  80 mg Subcutaneous Q24H  . feeding supplement (ENSURE ENLIVE)  237 mL Oral TID BM  . feeding supplement (RESOURCE BREEZE)  1 Container Oral TID BM  . fentaNYL  100 mcg Transdermal Q72H  . ipratropium  0.5 mg Nebulization QID  . levalbuterol  0.63 mg Nebulization QID  . levofloxacin  500 mg Oral Daily  . megestrol  400 mg Oral BID  . metoprolol tartrate  25 mg Oral BID  . morphine  30 mg Oral Q12H  . nicotine  14 mg Transdermal Daily  . ondansetron  4 mg Oral Q6H   Or  . ondansetron (ZOFRAN) IV  4 mg Intravenous Q6H  . pantoprazole  40 mg Oral BID  . polyethylene glycol  17 g Oral Daily  . senna-docusate  1 tablet Oral QHS  . sodium chloride  3 mL Intravenous Q12H   Continuous Infusions: . sodium chloride      Bonnielee Haff, MD [phone]  09/27/2014, 9:20 AM  LOS: 16 days   [facility] Office  [phone] Pager - Text Page per [email or web address]  If 7PM-7AM, please  contact night-coverage [email or web address]

## 2014-09-27 NOTE — Progress Notes (Signed)
[place] [phone]   [phone]  CONSULT NOTE  REFERRING PHYSICIAN: Dr. Bonnielee Haff.  REASON FOR CONSULTATION:  49 years old white female with lung cancer.  HPI [name] is a 49 y.o. female with no significant past medical history except for long history of smoking. The patient most recently to [place] from [place] to be close to her brother. She mentions that she was diagnosed in March 2015 with a stage IIIB non-small cell lung cancer, squamous cell carcinoma after she presented with midthoracic back pain. She had chest x-ray followed by CT scan of the chest at that time on 08/31/2013 which revealed an enlarging left hilar mass with marked narrowing at the left pulmonary artery and obliteration of the left mainstem bronchus with collapse of the left upper lobe and a small left pleural effusion raising the possibility of M1a lesion. Bronchoscopy performed by Dr. Hillery Aldo showed a left mainstem intraluminal mass which was positive for squamous cell carcinoma. The mass locally like it was invading into the mediastinum. The patient was treated with a course of concurrent chemoradiation with weekly carboplatin and paclitaxel. She received 38 fractions of radiotherapy. Her radiation therapy was given by Dr. Charyl Bigger. Her course of chemoradiation was completed in June 2015. Imaging studies at that time showed interval response with decrease in the chronic left hydropneumothorax with reexpansion of the left upper lobe. The infiltrative mass involving the left hilum also improved. The patient did not receive any consolidation chemotherapy. She had few episodes of pneumonia during that time. The patient decided to move to [facility] to be close to her brother. She was supposed to see me on an outpatient basis but she was admitted to [facility] with significant shortness of breath and she was diagnosed with bilateral pneumonia. CT scan of the  chest on 09/10/2014 showed volume loss in the left hemothorax with paramediastinal soft tissue density. There is paramediastinal reticular changes. Findings suggest posttreatment related change related to lung cancer, however no prior exams are available for comparison and correlation with history is recommended. There is a bilobed 1.6 cm pleural based nodule in the left upper lobe concerning for malignancy. Probable necrotic lymph node in the AP window. Lucent lesion in left posterior ninth rib concerning for bony metastasis. She also had CT angiogram of the chest on 09/18/2014 that showed no evidence of acute pulmonary embolism. There was no CVA or abnormal pulmonary opacity in both lungs resembling bilateral pneumonia but widespread endobronchial spread of tumor was also possible and the patient developed new bilateral pleural effusion located on the left and associated with pleural and chest wall tumor involvement. There was also subjacent abnormal enhancement in the spleen suspicious and suspected to reflect infiltration of either tumor or infection. The patient also had multiple destructive left rib metastases with pleural and chest wall involvement. She was treated aggressively with a course of antibiotics and she is currently on Levaquin 500 mg by mouth daily. Her Port-A-Cath was also removed because of suspicious catheter embolism. She is currently on Lovenox 80 g subcutaneously daily. The patient feels a little bit better today but continues to have shortness of breath. When seen today she denied having any other significant complaints except for the shortness of breath and fatigue. Her family history significant for a mother who was diagnosed with lung cancer. The patient is single and used to work as a Garment/textile technologist. She has no children. She recently moved from [facility]  to [facility] to be close to her brother. She has a long history of smoking 1 pack per day for around 30 years. She quit  recently. She drinks alcohol occasionally in the history of drug abuse.  HPI  Past Medical History  Diagnosis Date  . Cancer     left bronchioles    Past Surgical History  Procedure Laterality Date  . Esophagogastroduodenoscopy N/A 09/14/2014    Procedure: ESOPHAGOGASTRODUODENOSCOPY (EGD);  Surgeon: Teena Irani, MD;  Location: [facility];  Service: Endoscopy;  Laterality: N/A;    History reviewed. No pertinent family history.  Social History History  Substance Use Topics  . Smoking status: Former Research scientist (life sciences)  . Smokeless tobacco: Not on file  . Alcohol Use: No    Allergies  Allergen Reactions  . Penicillins Rash    Current Facility-Administered Medications  Medication Dose Route Frequency Provider Last Rate Last Dose  . 0.9 %  sodium chloride infusion   Intravenous Continuous Bonnielee Haff, MD      . acetaminophen (TYLENOL) tablet 650 mg  650 mg Oral Q6H PRN Lavina Hamman, MD       Or  . acetaminophen (TYLENOL) suppository 650 mg  650 mg Rectal Q6H PRN Lavina Hamman, MD      . acidophilus (RISAQUAD) capsule 1 capsule  1 capsule Oral Daily Albertine Patricia, MD   1 capsule at 09/26/14 1019  . albuterol (PROVENTIL) (2.5 MG/3ML) 0.083% nebulizer solution 2.5 mg  2.5 mg Nebulization Q4H PRN Bonnielee Haff, MD      . alum & mag hydroxide-simeth (MAALOX/MYLANTA) 200-200-20 MG/5ML suspension 30 mL  30 mL Oral Q4H PRN Debbe Odea, MD   30 mL at 09/11/14 1639  . antiseptic oral rinse (CPC / CETYLPYRIDINIUM CHLORIDE 0.05%) solution 7 mL  7 mL Mouth Rinse BID Lavina Hamman, MD   7 mL at 09/26/14 2200  . antiseptic oral rinse (CPC / CETYLPYRIDINIUM CHLORIDE 0.05%) solution 7 mL  7 mL Mouth Rinse q12n4p Silver Huguenin Elgergawy, MD   7 mL at 09/26/14 1600  . calcium carbonate (TUMS - dosed in mg elemental calcium) chewable tablet 200 mg of elemental calcium  1 tablet Oral QID PRN Debbe Odea, MD      . chlorhexidine (PERIDEX) 0.12 % solution 15 mL  15 mL Mouth Rinse BID Albertine Patricia,  MD   15 mL at 09/27/14 0800  . dexamethasone (DECADRON) tablet 8 mg  8 mg Oral 3 times per day Bonnielee Haff, MD      . dextromethorphan-guaiFENesin [facility] DM) 30-600 MG per 12 hr tablet 1 tablet  1 tablet Oral BID Debbe Odea, MD   1 tablet at 09/26/14 2357  . enoxaparin (LOVENOX) injection 80 mg  80 mg Subcutaneous Q24H Audrea [place], RPH   80 mg at 09/26/14 1700  . feeding supplement (ENSURE ENLIVE) (ENSURE ENLIVE) liquid 237 mL  237 mL Oral TID BM Debbe Odea, MD   237 mL at 09/26/14 1017  . feeding supplement (RESOURCE BREEZE) (RESOURCE BREEZE) liquid 1 Container  1 Container Oral TID BM Debbe Odea, MD   1 Container at 09/26/14 1018  . fentaNYL (DURAGESIC - dosed mcg/hr) 100 mcg  100 mcg Transdermal Q72H Debbe Odea, MD   100 mcg at 09/25/14 1112  . guaiFENesin-dextromethorphan (ROBITUSSIN DM) 100-10 MG/5ML syrup 5 mL  5 mL Oral Q4H PRN Gardiner Barefoot, NP   5 mL at 09/14/14 1117  . hydrALAZINE (APRESOLINE) injection 10 mg  10 mg Intravenous Q6H  PRN Ritta Slot, NP   10 mg at 09/13/14 0523  . HYDROmorphone (DILAUDID) injection 1 mg  1 mg Intravenous Q2H PRN Albertine Patricia, MD   1 mg at 09/27/14 0840  . ipratropium (ATROVENT) nebulizer solution 0.5 mg  0.5 mg Nebulization QID Bonnielee Haff, MD   0.5 mg at 09/27/14 0739  . levalbuterol (XOPENEX) nebulizer solution 0.63 mg  0.63 mg Nebulization QID Bonnielee Haff, MD   0.63 mg at 09/27/14 0739  . levofloxacin (LEVAQUIN) tablet 500 mg  500 mg Oral Daily Bonnielee Haff, MD   500 mg at 09/26/14 1259  . lip balm (CARMEX) ointment   Topical PRN Albertine Patricia, MD      . LORazepam (ATIVAN) tablet 1 mg  1 mg Oral Q6H PRN Knox Royalty, NP      . megestrol (MEGACE) 400 MG/10ML suspension 400 mg  400 mg Oral BID Debbe Odea, MD   400 mg at 09/26/14 1017  . metoprolol tartrate (LOPRESSOR) tablet 25 mg  25 mg Oral BID Bonnielee Haff, MD   25 mg at 09/26/14 2357  . morphine (MS CONTIN) 12 hr tablet 30 mg  30 mg Oral Q12H Debbe Odea,  MD   30 mg at 09/26/14 2358  . morphine (MSIR) tablet 15 mg  15 mg Oral Q4H PRN Debbe Odea, MD   15 mg at 09/25/14 1338  . nicotine (NICODERM CQ - dosed in mg/24 hours) patch 14 mg  14 mg Transdermal Daily Debbe Odea, MD   14 mg at 09/26/14 1017  . ondansetron (ZOFRAN) tablet 4 mg  4 mg Oral Q6H Saima Rizwan, MD   4 mg at 09/27/14 0537   Or  . ondansetron (ZOFRAN) injection 4 mg  4 mg Intravenous Q6H Saima Rizwan, MD   4 mg at 09/26/14 1700  . pantoprazole (PROTONIX) EC tablet 40 mg  40 mg Oral BID Bonnielee Haff, MD   40 mg at 09/26/14 2357  . polyethylene glycol (MIRALAX / GLYCOLAX) packet 17 g  17 g Oral Daily Lavina Hamman, MD   17 g at 09/26/14 1017  . promethazine (PHENERGAN) injection 12.5 mg  12.5 mg Intravenous Q6H PRN Ritta Slot, NP   12.5 mg at 09/15/14 2253  . senna-docusate (Senokot-S) tablet 1 tablet  1 tablet Oral QHS Lavina Hamman, MD   1 tablet at 09/26/14 2357  . sodium chloride 0.9 % injection 10-40 mL  10-40 mL Intracatheter PRN Debbe Odea, MD   10 mL at 09/27/14 0546  . sodium chloride 0.9 % injection 3 mL  3 mL Intravenous Q12H Lavina Hamman, MD   3 mL at 09/26/14 2358    Review of Systems  Constitutional: positive for anorexia, fatigue and weight loss Eyes: negative Ears, nose, mouth, throat, and face: negative Respiratory: positive for cough and dyspnea on exertion Cardiovascular: negative Gastrointestinal: negative Genitourinary:negative Integument/breast: negative Hematologic/lymphatic: negative Musculoskeletal:negative Neurological: negative Behavioral/Psych: negative Endocrine: negative Allergic/Immunologic: negative  Physical Exam  [id number], healthy, no distress, ill looking and malnourished SKIN: skin color, texture, turgor are normal HEAD: Normocephalic, No masses, lesions, tenderness or abnormalities EYES: normal, PERRLA EARS: External ears normal, Canals clear OROPHARYNX:no exudate, no erythema and lips, buccal mucosa, and tongue  normal  NECK: supple, no adenopathy, no JVD LYMPH:  no palpable lymphadenopathy, no hepatosplenomegaly BREAST:not examined LUNGS: decreased breath sounds, scattered rales bilaterally HEART: regular rate & rhythm, no murmurs and no gallops ABDOMEN:abdomen soft, non-tender, normal bowel sounds and no masses or  organomegaly BACK: Back symmetric, no curvature., No CVA tenderness EXTREMITIES:no joint deformities, effusion, or inflammation, no edema, no skin discoloration  NEURO: alert & oriented x 3 with fluent speech, no focal motor/sensory deficits  PERFORMANCE STATUS: ECOG 2  LABORATORY DATA: Lab Results  Component Value Date   WBC 20.5* 09/27/2014   HGB 12.7 09/27/2014   HCT 38.9 09/27/2014   MCV 85.1 09/27/2014   PLT 497* 09/27/2014    @LASTCHEM @  RADIOGRAPHIC STUDIES: Dg Chest 1 View  09/20/2014   CLINICAL DATA:  Short of breath.  Evaluate Port-A-Cath.  EXAM: CHEST  1 VIEW  COMPARISON:  One day prior  FINDINGS: A right Port-A-Cath from an internal jugular approach terminates at the high SVC. The catheter extends superiorly and is angulated in the low neck prior to extending inferiorly. Midline trachea. Mild cardiomegaly. Moderate left pleural effusion and small right pleural effusions are similar. No pneumothorax. Left lower lobe airspace disease is persistent. Patchy interstitial and airspace opacities throughout the right lung are not significantly changed.  IMPRESSION: Right internal jugular Port-A-Cath which is angled in the neck. Consider repositioning.  No significant change in appearance of the chest. Interstitial and airspace disease throughout the right lung which is suspicious for infection. Asymmetric pulmonary edema less likely.  Left base consolidation adjacent to the left pleural fluid is similar.  Trace right pleural fluid.   Electronically Signed   By: Abigail Miyamoto M.D.   On: 09/20/2014 16:02   Ct Chest W Contrast  09/11/2014   CLINICAL DATA:  Left-sided chest pain  radiating to the back. 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  EXAM: CT CHEST WITH CONTRAST  TECHNIQUE: Multidetector CT imaging of the chest was performed during intravenous contrast administration.  CONTRAST:  14mL OMNIPAQUE IOHEXOL 300 MG/ML  SOLN  COMPARISON:  Chest radiograph earlier this day. No remote exams available for comparison.  FINDINGS: Right chest port with tip in the proximal SVC. There is volume loss in the left hemithorax with short this segment of marked luminal narrowing involving the left mainstem bronchus at the left hilum. Soft tissue density is noted adjacent to the right and left bronchi at the hilum. There is paramediastinal soft tissue density in the left upper lobe that has heterogeneous enhancement. Within the left upper lobe this a pleural-based bilobed 1.6 x 1.6 cm nodule. Reticular opacities at the left lung apex with associated apical thickening. Minimal linear opacities in the left lower lobe, with irregular opacity at the costophrenic angle. There is a small partially loculated left pleural effusion.  No right-sided tiny subpleural nodule opacities at the lung apex measuring 4 mm. There are otherwise no right pulmonary nodules. Emphysematous changes noted. There is mild paramediastinal fibrosis in the right lung that may be radiation change.  There is a 2.3 x 1.4 cm probable necrotic lymph node in the AP window. Minimal soft tissue density in the left hilum. There is otherwise no mediastinal adenopathy. Small pericardial effusion primarily adjacent to the right atrium. No pericardial nodularity or enhancement  The heart size is normal. The thoracic aorta is normal in caliber without dissection.  Evaluation of the upper abdomen demonstrates no acute abnormality. There are no adrenal nodules. No focal hepatic lesion in the included liver.  Lobular lucent partially expansile lesion with and posterior left ninth rib concerning for metastasis. No pathologic  fracture. No definite additional focal osseous lesion.  IMPRESSION: 1. Volume loss in the left hemithorax with paramediastinal soft tissue density. There is para-mediastinal  reticular change. Findings suggest posttreatment related change related to lung cancer, however no prior exams are available for comparison and correlation with history is recommended. There is a bilobed 1.6 cm pleural based nodule in the left upper lobe concerning for malignancy. Probable necrotic lymph node in the AP window. 2. Lucent lesion in left posterior ninth rib concerning for bony metastasis. 3. Underlying emphysema. Tiny subpleural nodularity at the right lung apex is nonspecific, suspect scarring, again correlation with prior imaging recommended.   Electronically Signed   By: Jeb Levering M.D.   On: 09/11/2014 00:30   Ct Angio Chest Pe W/cm &/or Wo Cm  09/18/2014   CLINICAL DATA:  49 year old female with left lung cancer, chemotherapy until February. Chest pain and shortness of breath for 1 week. Initial encounter.  EXAM: CT ANGIOGRAPHY CHEST WITH CONTRAST  TECHNIQUE: Multidetector CT imaging of the chest was performed using the standard protocol during bolus administration of intravenous contrast. Multiplanar CT image reconstructions and MIPs were obtained to evaluate the vascular anatomy.  CONTRAST:  33mL OMNIPAQUE IOHEXOL 350 MG/ML SOLN  COMPARISON:  Chest CT 09/10/2014.  FINDINGS: Good contrast bolus timing in the pulmonary arterial tree. Mass effect on the left main pulmonary artery with narrowing, but the vessel remains patent. No left side pulmonary artery branch occlusion identified. Elsewhere no filling defect identified to suggest the presence of acute pulmonary embolism.  New bilateral pleural effusions, mostly loculated and/or sub pulmonic on the left and moderate on the right. Progressed pleural based soft tissue mass in the left upper lung measuring 2 cm now (1.7 cm last week). Small pericardial effusion is new.  New widespread abnormal mostly interstitial or ground-glass opacity in the right lung. Only in the superior segment of the lower lobe is spared. New abnormal confluent left lower lobe opacity along the surface of the diaphragm and also surrounding loculated appearing left lung base fluid. Stable Major airways with high-grade stenosis of the left mainstem bronchus. Post treatment related collapse of a portion of the medial left upper lung appears stable. Underlying emphysema.  Subjacent to the abnormal fluid and lung density at the left lung base the medial superior aspect of the spleen is now abnormally enhancing (see series 4, image 72 and coronal image 84 of series 605. This is patchy and not wedge-shaped. Also somewhat remote from this area there is mild hypo enhancement at the anterior aspect of the spleen. The appearance does not resemble normal early splenic artery enhancement.  The visible liver is grossly stable. Visible adrenal glands, renal parenchyma, pancreas, and bowel in the abdomen do not appear significantly changed.  Negative visualized aorta new abnormal mediastinal soft tissue or fluid most pronounced in the sub carina region (series 4, image 43). Suggestion of associated thickening of the thoracic esophagus. There is small volume retained hyperdense material within the esophagus.  No axillary lymphadenopathy.  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 There may also be early involvement of the posterior tenth rib on series 7, image 78. There is a left lateral destructive soft tissue mass at the left eighth rib which has not significantly changed (series 7, image 75). Right chest porta cath remains in place. No destructive lesion identified in the spine.  Review of the MIP images confirms the above findings.  IMPRESSION: 1. No evidence of acute pulmonary embolus. Tumor versus post treatment fibrosis narrowing the  left pulmonary arteries. 2. New severe abnormal pulmonary opacity  in both lungs since 09/10/2014 most resembles bilateral pneumonia. Widespread endobronchial spread of tumor also is possible. 3. New bilateral pleural effusions, loculated on the left and associated with pleural and chest wall tumor involvement. Subjacent abnormal enhancement in the spleen is suspicious and suspected to reflect infiltration of either tumor or infection through the diaphragm. 4. Multiple destructive left rib metastases, with pleural and chest wall involvement.   Electronically Signed   By: Genevie Ann M.D.   On: 09/18/2014 12:12   Ir Removal Tun Access W/ Port W/o Fl Mod Sed  09/23/2014   CLINICAL DATA:  Malfunctioning right IJ power port catheter with evidence of SVC thrombosis by contrast injection.  EXAM: RIGHT IJ TUNNEL POWER PORT CATHETER REMOVAL  Date:  4/5/20164/10/2014 3:25 pm  Radiologist:  M. Daryll Brod, MD  MEDICATIONS AND MEDICAL HISTORY: 1% lidocaine locally  COMPLICATIONS: None immediate  PROCEDURE: Informed consent was obtained from the patient following explanation of the procedure, risks, benefits and alternatives. The patient understands, agrees and consents for the procedure. All questions were addressed. A time out was performed.  Maximal barrier sterile technique utilized including caps, mask, sterile gowns, sterile gloves, large sterile drape, hand hygiene, and ChloraPrep.  Under sterile conditions and local anesthesia, an incision was made along the existing port catheter scar. Blunt and sharp dissection performed to remove the catheter. Hemostasis obtained. Incision closed in a two-layer fashion with interrupted and running Vicryl suture. No immediate complication. Sterile dressing applied. The patient tolerated the procedure well.  IMPRESSION: Successful right IJ power port catheter removal.   Electronically Signed   By: Jerilynn Mages.  Shick M.D.   On: 09/23/2014 15:42   Ir Cv Line Injection  09/23/2014   CLINICAL DATA:   Malfunctioning right IJ power port catheter  EXAM: FLUOROSCOPIC INJECTION OF THE EXISTING RIGHT IJ POWER PORT  Date:  4/5/20164/10/2014 9:55 am  Radiologist:  M. Daryll Brod, MD  Guidance:  FLUOROSCOPIC  FLUOROSCOPY TIME:  6 SECONDS, 18 mGY  MEDICATIONS AND MEDICAL HISTORY: NONE.  ANESTHESIA/SEDATION: NONE  CONTRAST:  10 CC OMNIPAQUE 619  COMPLICATIONS: None immediate  PROCEDURE: Informed consent was obtained from the patient following explanation of the procedure, risks, benefits and alternatives. The patient understands, agrees and consents for the procedure. All questions were addressed. A time out was performed.  Under sterile conditions, the existing accessed right IJ power port catheter was injected under fluoroscopy with contrast. Subtraction imaging performed.  Right IJ port catheter tubing has a high course into the right neck, suspect related to high access of the jugular vein during placement. Port catheter tubing terminates in the proximal SVC just below the innominate vein confluence. Upon injection, nodular filling defects are present in SVC with sluggish flow compatible with SVC thrombosis, appearing nearly occlusive. No significant fibrin sheath about the catheter tip. Contrast does flow into the lower SVC and right atrium.  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  These results were called by telephone at the time of interpretation on 09/23/2014 at 10:28 am to Dr. Phillips Climes , who verbally acknowledged these results.   Electronically Signed   By: Jerilynn Mages.  Shick M.D.   On: 09/23/2014 11:29   Ir US Guide Vasc Access Right  09/23/2014   CLINICAL DATA:  Malfunctioning port catheter with SVC thrombosis. Port catheter removed. Limited access.  EXAM: RIGHT UPPER EXTREMITY DOUBLE-LUMEN PERIPHERAL PICC LINE PLACEMENT WITH ULTRASOUND AND FLUOROSCOPIC GUIDANCE  FLUOROSCOPY TIME:  NONE.  PROCEDURE: The patient was advised  of the possible risks andcomplications and  agreed to undergo the procedure. The patient was then brought to the angiographic suite for the procedure.  The RIGHT arm was prepped with chlorhexidine, drapedin the usual sterile fashion using maximum barrier technique (cap and mask, sterile gown, sterile gloves, large sterile sheet, hand hygiene and cutaneous antisepsis) and infiltrated locally with 1% Lidocaine.  Ultrasound demonstrated patency of the RIGHT BASILIC vein, and this was documented with an image. Under real-time ultrasound guidance, this vein was accessed with a 21 gauge micropuncture needle and image documentation was performed. A 0.018 wire was introduced in to the vein. Over this, a 5 [place] DOUBLE lumen POWER PICC was advanced INTO THE RIGHT BASILIC VEIN PERIPHERALLY. THIS SHOULD BE USED AS PERIPHERAL ACCESS ONLY. The catheter was flushed and covered with asterile dressing.  Catheter length: 9 CM  Complications: NONE IMMEDIATE  IMPRESSION: Successful right upper extremitydouble-lumen intentionally short power PICC line placement with ultrasound guidance. Catheter is only 9 cm in length with the tip in the right basilic vein peripherally. This should be used as peripheral access only. The catheter is ready for use.   Electronically Signed   By: Jerilynn Mages.  Shick M.D.   On: 09/23/2014 15:49   Dg Chest Port 1 View  09/24/2014   CLINICAL DATA:  Pulmonary infiltrate.  EXAM: PORTABLE CHEST - 1 VIEW  COMPARISON:  09/23/2014.  09/22/2014 .  FINDINGS: Interim removal power Port catheter. Mediastinum and hilar structures are normal. Diffuse right lung infiltrate. Left lung base atelectasis and/or infiltrate with persistent left pleural effusion. No pneumothorax. Stable heart size.  IMPRESSION: 1. Interim removal of PowerPort catheter. 2. Persistent diffuse right lung infiltrate. Persistent atelectasis and or infiltrate left lung base with persistent left pleural effusion. No significant interim change.   Electronically Signed   By: Marcello Moores  Register   On:  09/24/2014 07:17   Dg Chest Port 1 View  09/23/2014   CLINICAL DATA:  Pulmonary infiltrates.  EXAM: PORTABLE CHEST - 1 VIEW  COMPARISON:  09/22/2014.  FINDINGS: Power port catheter noted with tip in the superior vena cava. Mediastinum and hilar structures are normal. Heart size stable. Persistent diffuse right lung infiltrate, no change. Small right pleural effusion cannot be excluded Persistent atelectasis left lower lobe with small left pleural effusion. No pneumothorax.  IMPRESSION: 1. Persistent diffuse right lung infiltrate. Small right pleural effusion cannot be excluded.  2. Persistent atelectasis of the left lower lobe with left pleural effusion. No change.   Electronically Signed   By: Marcello Moores  Register   On: 09/23/2014 07:18   Dg Chest Port 1 View  09/22/2014   CLINICAL DATA:  Followup pleural effusion and pulmonary infiltrates. Left lung carcinoma.  EXAM: PORTABLE CHEST - 1 VIEW  COMPARISON:  09/20/2014  FINDINGS: Right-sided power port remains in stable position. A moderate left pleural effusion and left lower lung consolidation or collapse shows no significant change.  Diffuse asymmetric right lung interstitial infiltrates also appears stable, with tiny right pleural effusion which is unchanged. Differential diagnosis includes asymmetric edema, atypical infection, and lymphangitic spread of carcinoma.  IMPRESSION: No significant change compared with prior exam.   Electronically Signed   By: Earle Gell M.D.   On: 09/22/2014 12:33   Dg Chest Port 1 View  09/19/2014   CLINICAL DATA:  Respiratory failure.  EXAM: PORTABLE CHEST - 1 VIEW  COMPARISON:  Chest CT 12 hr prior  FINDINGS: Tip of the right chest port remains in the proximal SVC. Unchanged elevation  of left hemidiaphragm with left pleural effusion. Diffuse parenchymal opacity throughout the right lung, mildly worsened in the right upper lobes. Persistent blunting of right costophrenic angle. Cardiomediastinal contours are unchanged.   IMPRESSION: 1. Diffuse parenchymal opacity throughout the right lung, mild worsening in the right upper lobe. 2. Unchanged appearance of the left hemithorax.   Electronically Signed   By: Jeb Levering M.D.   On: 09/19/2014 00:44   Dg Chest Port 1 View  09/18/2014   CLINICAL DATA:  Respiratory failure, malignancy with history of left lung cancer July 2015 having undergone chemotherapy and radiation therapy completed February 2016  EXAM: PORTABLE CHEST - 1 VIEW  COMPARISON:  09/17/2014  FINDINGS: Moderate left pleural effusions stable. Volume loss left thorax with shift of mediastinum for the left, stable. Heavy interstitial change throughout the right lung stable, with milder interstitial change on the left. Abnormal opacity retrocardiac left lower lobe stable. No change in position of Port-A-Cath. Stable mild right costophrenic angle blunting.  IMPRESSION: No significant change from prior study. Persistent left effusion and left lower lobe consolidation as well as interstitial prominence suggesting pulmonary edema.   Electronically Signed   By: Skipper Cliche M.D.   On: 09/18/2014 10:15   Dg Chest Port 1 View  09/18/2014   CLINICAL DATA:  Respiratory distress. Hypoxia. History of lung cancer.  EXAM: PORTABLE CHEST - 1 VIEW  COMPARISON:  Most recent radiograph earlier this day at 0122 hr, Chest CT 323 16  FINDINGS: Volume loss in the left hemithorax, however mildly improved aeration from prior exam. Left basilar consolidation and pleural effusion, mildly improved. Slight decrease in cardiomegaly. Interstitial edema, similar to prior. Multifocal parenchymal opacities in the periphery of the right lung, improved in the mid lung zone. Right chest port remains in place. Unchanged blunting of the right costophrenic angle. There is no pneumothorax.  IMPRESSION: 1. Improved radiographic appearance of the chest with slight improvement in left basilar and right mid lung zone aeration. 2. Pulmonary edema and  multifocal opacities in the periphery of the right lung, unchanged.   Electronically Signed   By: Jeb Levering M.D.   On: 09/18/2014 00:14   Dg Chest Port 1 View  09/17/2014   CLINICAL DATA:  Sudden onset of shortness of breath. Initial encounter.  EXAM: PORTABLE CHEST - 1 VIEW  COMPARISON:  Chest radiograph performed 09/14/2014  FINDINGS: Worsening left basilar airspace opacification is compatible with pneumonia. Underlying interstitial prominence is again noted, likely reflecting mild pulmonary edema. Mild focal peripheral right mid and lower lung zone airspace opacities are also seen, concerning for pneumonia. Small bilateral pleural effusions are again seen. There is chronic elevation of the left hemidiaphragm. No pneumothorax is identified.  The cardiomediastinal silhouette is enlarged. A right-sided chest port is noted ending overlying the mid SVC. No acute osseous abnormalities are identified.  IMPRESSION: 1. Worsening left basilar airspace opacification, and mild focal right peripheral mid and lower lung zone airspace opacities, compatible with multifocal pneumonia, new from the prior study. 2. Underlying interstitial prominence likely reflects mild pulmonary edema. Small bilateral pleural effusions and cardiomegaly again noted.   Electronically Signed   By: Garald Balding M.D.   On: 09/17/2014 01:36   Dg Chest Port 1 View  09/14/2014   CLINICAL DATA:  Cough, left lung cancer  EXAM: PORTABLE CHEST - 1 VIEW  COMPARISON:  CT 09/10/2014, chest radiograph 09/10/2014.  FINDINGS: Right-sided Port-A-Cath in place with tip over the upper SVC. Left hemithoracic volume loss and  postsurgical change reidentified. Interval development of ill-defined diffuse reticular interstitial pulmonary opacities. No new focal airspace disease.  IMPRESSION: Development of interstitial reticular opacities likely indicating interstitial edema.   Electronically Signed   By: Conchita Paris M.D.   On: 09/14/2014 09:52   Dg  Chest Port 1 View  09/10/2014   CLINICAL DATA:  Left-sided chest pain.  Lung carcinoma  EXAM: PORTABLE CHEST - 1 VIEW  COMPARISON:  None.  FINDINGS: There is elevation of the left hemidiaphragm. There is a small left pleural effusion with atelectatic change in left base. There is ill-defined opacity in the left upper lobe, probably representing pneumonia, although mass in this area is a differential consideration. Right lung is clear. Heart size is normal. Pulmonary vascularity is within normal limits. No adenopathy. Port-A-Cath tip is in the superior vena cava. No pneumothorax evident.  IMPRESSION: Opacity left upper lobe. Probable pneumonia, although mass in this area is a differential consideration. Small left effusion. Volume loss on left. Right lung clear.   Electronically Signed   By: Lowella Grip III M.D.   On: 09/10/2014 19:35   Dg Abd Portable 2v  09/26/2014   CLINICAL DATA:  One-day history of nausea and vomiting ; history of lung malignancy  EXAM: PORTABLE ABDOMEN - 2 VIEW  COMPARISON:  None.  FINDINGS: There is a moderate volume of gas within the descending colon. The colon does not appear abnormally distended. Elsewhere the colon is unremarkable. There is no small bowel obstructive pattern. No free extraluminal gas collections are demonstrated. There is mild degenerative change of the lower lumbar spine.  IMPRESSION: Nonspecific bowel gas pattern. There is no evidence of obstruction or ileus. If the patient's symptoms warrant further evaluation, abdominal and pelvic CT scanning would be the most useful next imaging modality.   Electronically Signed   By: David  Martinique   On: 09/26/2014 16:12   Ir Fluoro Guide Cv Midline Picc Right  09/23/2014   CLINICAL DATA:  Malfunctioning port catheter with SVC thrombosis. Port catheter removed. Limited access.  EXAM: RIGHT UPPER EXTREMITY DOUBLE-LUMEN PERIPHERAL PICC LINE PLACEMENT WITH ULTRASOUND AND FLUOROSCOPIC GUIDANCE  FLUOROSCOPY TIME:  NONE.   PROCEDURE: The patient was advised of the possible risks andcomplications and agreed to undergo the procedure. The patient was then brought to the angiographic suite for the procedure.  The RIGHT arm was prepped with chlorhexidine, drapedin the usual sterile fashion using maximum barrier technique (cap and mask, sterile gown, sterile gloves, large sterile sheet, hand hygiene and cutaneous antisepsis) and infiltrated locally with 1% Lidocaine.  Ultrasound demonstrated patency of the RIGHT BASILIC vein, and this was documented with an image. Under real-time ultrasound guidance, this vein was accessed with a 21 gauge micropuncture needle and image documentation was performed. A 0.018 wire was introduced in to the vein. Over this, a 5 [place] DOUBLE lumen POWER PICC was advanced INTO THE RIGHT BASILIC VEIN PERIPHERALLY. THIS SHOULD BE USED AS PERIPHERAL ACCESS ONLY. The catheter was flushed and covered with asterile dressing.  Catheter length: 9 CM  Complications: NONE IMMEDIATE  IMPRESSION: Successful right upper extremitydouble-lumen intentionally short power PICC line placement with ultrasound guidance. Catheter is only 9 cm in length with the tip in the right basilic vein peripherally. This should be used as peripheral access only. The catheter is ready for use.   Electronically Signed   By: Jerilynn Mages.  Shick M.D.   On: 09/23/2014 15:49    ASSESSMENT: This is a very pleasant 49 years old white female  with metastatic non-small cell lung cancer, adenocarcinoma initially diagnosed as a stage IIIB status post course of concurrent chemoradiation with weekly carboplatin and paclitaxel but the patient did not receive any consolidation chemotherapy. She was recently admitted with bilateral pneumonia and the recent imaging studies showed evidence for disease progression with involvement of the chest wall and pleural based metastases as well as osseous metastasis.   PLAN: I had a lengthy discussion with the patient today about  her current disease status and treatment options. I will arrange for the patient to have restaging PET scan as well as MRI of the brain on outpatient basis after discharge for further evaluation of her disease before starting the next course of systemic therapy. For the history of bilateral pneumonia, the patient will continue on her current treatment with Levaquin for now. The patient also has a history of COPD and she is currently on treatment with the open ask and Atrovent. For pain management, continue the current treatment with MS Contin and morphine sulfate. For the history of deep venous thrombosis, continue her current treatment with Lovenox. I will arrange for the patient a follow-up appointment with me within one week after discharge for reevaluation and discussion of her treatment options. The patient voices understanding of current disease status and treatment options and is in agreement with the current care plan.  All questions were answered. The patient knows to call the clinic with any problems, questions or concerns. We can certainly see the patient much sooner if necessary.  Thank you so much for allowing me to participate in the care of [name]. I will continue to follow up the patient with you and assist in her care.  Disclaimer: This note was dictated with voice recognition software. Similar sounding words can inadvertently be transcribed and may not be corrected upon review.   [name] K. September 27, 2014, 9:24 AM

## 2014-09-27 NOTE — Progress Notes (Signed)
Spent 45 minutes with patient. She was very tearful. Stating she was in pain and nauseated. Pt is very anixous as well. Massaged patients back. Pt stated that she had not seen anyone in hours. Explained that was not true. We got her up in the chair. She had only been up in he chair for 30 minutes. Reminded her that she initially refused her medication. She is now reperceptive to taking medication.
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
## 2014-09-27 NOTE — Progress Notes (Signed)
OT Cancellation Note  Patient Details Name: [name] MRN: [id number] DOB: 20-Aug-1965   Cancelled Treatment:    Reason Eval/Treat Not Completed: Other (comment) Per nursing, pt has been really anxious and is resting. Nursing requests that OT check back another day.  [place], [place] 09/27/2014, 3:49 PM

## 2014-09-27 NOTE — Progress Notes (Signed)
Visited with patient and offered BD (she previously refused scheduled dose). Patient continues to decline and wishes to not be bothered in the night. In addition, she is noncompliant with the flutter and does not tolerate CPT via vest or manual due to pain. RT protocol assessment completed. Orders changed accordingly.

## 2014-09-28 ENCOUNTER — Inpatient Hospital Stay (HOSPITAL_COMMUNITY): Payer: 59

## 2014-09-28 ENCOUNTER — Encounter (HOSPITAL_COMMUNITY): Payer: Self-pay | Admitting: Radiology

## 2014-09-28 LAB — BASIC METABOLIC PANEL
Anion gap: 7 (ref 5–15)
BUN: 19 mg/dL (ref 6–23)
CALCIUM: 8.9 mg/dL (ref 8.4–10.5)
CO2: 23 mmol/L (ref 19–32)
CREATININE: 0.56 mg/dL (ref 0.50–1.10)
Chloride: 101 mmol/L (ref 96–112)
GFR calc Af Amer: >90 mL/min (ref >90)
GFR calc non Af Amer: >90 mL/min (ref >90)
Glucose, Bld: 136 mg/dL — ABNORMAL HIGH (ref 70–99)
Potassium: 4.8 mmol/L (ref 3.5–5.1)
SODIUM: 131 mmol/L — AB (ref 135–145)

## 2014-09-28 LAB — CBC
HCT: 36.7 % (ref 36.0–46.0)
Hemoglobin: 11.7 g/dL — ABNORMAL LOW (ref 12.0–15.0)
MCH: 27.2 pg (ref 26.0–34.0)
MCHC: 31.9 g/dL (ref 30.0–36.0)
MCV: 85.3 fL (ref 78.0–100.0)
Platelets: 473 10*3/uL — ABNORMAL HIGH (ref 150–400)
RBC: 4.3 MIL/uL (ref 3.87–5.11)
RDW: 17 % — ABNORMAL HIGH (ref 11.5–15.5)
WBC: 19.3 10*3/uL — ABNORMAL HIGH (ref 4.0–10.5)

## 2014-09-28 LAB — OSMOLALITY, URINE: Osmolality, Ur: 607 mOsm/kg (ref 390–1090)

## 2014-09-28 MED ORDER — POLYETHYLENE GLYCOL 3350 17 G PO PACK
17.0000 g | PACK | Freq: Three times a day (TID) | ORAL | Status: DC
  Administered 2014-09-29 – 2014-09-30 (×3): 17 g via ORAL
  Filled 2014-09-28 (×7): qty 1

## 2014-09-28 MED ORDER — DOCUSATE SODIUM 100 MG PO CAPS
100.0000 mg | ORAL_CAPSULE | Freq: Two times a day (BID) | ORAL | Status: DC
  Administered 2014-09-28 – 2014-09-30 (×4): 100 mg via ORAL
  Filled 2014-09-28 (×5): qty 1

## 2014-09-28 MED ORDER — IOHEXOL 300 MG/ML  SOLN
50.0000 mL | Freq: Once | INTRAMUSCULAR | Status: AC | PRN
  Administered 2014-09-28: 50 mL via ORAL

## 2014-09-28 MED ORDER — IOHEXOL 300 MG/ML  SOLN
100.0000 mL | Freq: Once | INTRAMUSCULAR | Status: AC | PRN
  Administered 2014-09-28: 100 mL via INTRAVENOUS

## 2014-09-28 NOTE — Progress Notes (Signed)
Rt took pt off [place] and placed on 100% NRB. Pt sats 100% no distress noted at this time.

## 2014-09-28 NOTE — Progress Notes (Signed)
[facility] Progress Note   [name] RJJ:[id number] DOB: 12/05/1965 DOA: 09/10/2014  PCP: No primary care provider on file. . She is new to this area. She is to follow-up with Dr. Julien Nordmann as an outpatient.  Brief narrative: [name] is a 49 y.o. female with Lung cancer who was treated for it in [place]. She was told recently after a PET scan that she had a mass in her lung and needed further treatment but she decided to move here with her brother to help care for her. Her records were sent to Dr Julien Nordmann. She presented to the hospital with ongoing severe left chest pain and was admitted for pain control. 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 Patient also developed A. fib with RVR, due to acute illness. Seen by cardiology. Placed on Amiodarone and back to normal sinus rhythm. Not anti-coagulation candidate. Continues to have poor respiratory status giving her pneumonia. Pulmonary service following. Patient had Port-A-Cath fluoroscopy to evaluate for malfunction, there was evidence of SVC thrombosis. Port-A-Cath was subsequently removed. She was started on IV heparin. Subsequently switched over to subcutaneous Lovenox.  Subjective: Patient experiencing worsening pain in the right flank area today. She states that her pain usually is in the left side. No nausea, vomiting.   Assessment/Plan:  Acute hypoxic resp failure -  Ssecondary to lung cancer, previous lung radiation, and COPD (no oxygen requirement though), HCAP. - She was on broad-spectrum antibiotics for aspiration pneumonia. Antibiotics were de-escalate it to Levaquin and metronidazole 4/8. She has significant nausea which could be due to the metronidazole. This was discontinued as she had completed more than 7 days of treatment.  - Initially on IV Lasix for volume overload,  currently stopped giving no further evidence of volume overload  - CT Chest angiogram negative for PE. - Was initially requiring BiPAP at bedtime. Transitioned from Ventimask to high flow nasal cannula so she can tolerate eating and taking oral medications. Continue to titrate down to nasal cannula if possible. Unclear if this has been attempted on not. - Has loculated pleural effusion, discussed with IR, unable to drain by ultrasound. Previous rounding MD also discussed with CT surgery. They don't think she would be a candidate for any intervention at this point. Pulmonology has also discussed with the specialists and again no intervention to be done at this time.   SVC thrombosis. - Patient had malfunctioning Port-A-Cath, fluoroscopy showing SVC thrombosis. - Her Port-A-Cath was removed. - Risks and benefits of anticoagulation were discussed with patient. Subsequently intravenous heparin was initiated after discussions with GI. Monitor closely for signs of bleeding. Hemoglobin is stable. No bleeding noted. - Discussed with Dr. Julien Nordmann. Ideally she should be on Lovenox at home for this thrombosis as she has cancer. Enoxaparin can be arranged for home use. So she was switched over to the same.   Cancer associated pain - She has been on narcotics for months for pain starting at her left lower rib cage and radiating upwards towards axilla- the pain was severe even prior to coming to [facility] per brother and patient.  - Has a lesion in left post 9th rib which is the only imaging finding that could be casing her pain - Pain is much better controlled , palliative care input appreciated. - Started on decadron to help with chest wall metastasis.  - Worsening pain in the right flank area. We will proceed with  CT of the abdomen and pelvis. CT of the chest report was reviewed. No obvious abnormality in the right rib cage area or the chest wall on that side.  HCAP (aspiration pneumonia) with loculated  pleural effusion - Overall stable. At admission she may have aspirated on vomitus (hematemesis), started on HCAP coverage (used Vanc/ Levaquin and Flagyl) on 3/27 as symptoms started on her 5th day in the hospital  - Patient had deterioration of respiratory status 3/29, CT chest showing significant progression of her pneumonia, so currently on vancomycin/cefepime/Flagyl - Continue with pulmonary toilet and nebs as needed. - Clinically she is improved. Continue Levaquin for one more day. Metronidazole was discontinued due to nausea.    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 Abdominal film did not suggest obstruction. Flagyl has been discontinued.  A-fib with RVR - Initially on Cardizem drip, then amiodarone drip, transitioned to oral amiodarone, back to normal sinus rhythm. Due to concerns for pulmonary toxicity Amiodarone discontinued. Cardiology was following. She did have a recurrence of atrial fibrillation on 4/8, but it resolved with the IV metoprolol. She is now in sinus rhythm. She was placed on oral metoprolol. - ECHO does not reveal underlying cardiac issues - [place] Vasc score of 1.  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 - CT showing left chest wall involvement - Dr Julien Nordmann to eventually see her as OP. But patient has had a prolonged hospital stay. So he will see her today. - Persistent leukocytosis, likely secondary to steroids.  Hyponatremia Likely due to some degree of hypovolemia. Improved with  normal saline. SIADH is also a possibility.  Anorexia-  - Ensure and Megace. Advanced diet.  Ongoing smoking - Nicotine patch  Severe protein calorie malnutrition Due to cancer. Continue nutritional supplements.  DVT prophylaxis:  On Lovenox Code Status: full code Family Communication: discussed with patient Disposition Plan:  PT and OT. Taper oxygen as much as possible. Mobilize.   Consultants: GI, pulmonary , cardiology, oncology  Procedures:  1. EGD: very severe esophagitis possibly due to previous radiation combined with reflux  2. ECHO:  Left ventricle: LVEF is grossly normal at approximately 60%.Diffiuclt to fully evaluate regional wall motion as endocardiumis difficult to see well. The cavity size was normal. Wallthickness was normal. - Pulmonary arteries: PA peak pressure: 34 mm Hg (S).  Antibiotics: Anti-infectives    Start     Dose/Rate Route Frequency Ordered Stop   09/26/14 1400  metroNIDAZOLE (FLAGYL) tablet 500 mg  Status:  Discontinued     500 mg Oral 3 times per day 09/26/14 0959 09/27/14 0916   09/26/14 1100  levofloxacin (LEVAQUIN) tablet 500 mg     500 mg Oral Daily 09/26/14 0959     09/18/14 2000  vancomycin (VANCOCIN) IVPB 1000 mg/200 mL premix  Status:  Discontinued     1,000 mg 200 mL/hr over 60 Minutes Intravenous Every 8 hours 09/18/14 1918 09/26/14 0959   09/18/14 1400  metroNIDAZOLE (FLAGYL) IVPB 500 mg  Status:  Discontinued  500 mg 100 mL/hr over 60 Minutes Intravenous Every 8 hours 09/18/14 1305 09/26/14 0959   09/17/14 0400  vancomycin (VANCOCIN) IVPB 750 mg/150 ml premix  Status:  Discontinued     750 mg 150 mL/hr over 60 Minutes Intravenous Every 8 hours 09/17/14 0159 09/18/14 1918   09/17/14 0300  ceFEPIme (MAXIPIME) 1 g in dextrose 5 % 50 mL IVPB  Status:  Discontinued     1 g 100 mL/hr over 30 Minutes Intravenous 3 times per day 09/17/14 0154 09/26/14 0959   09/15/14 1000  levofloxacin (LEVAQUIN) tablet 750 mg  Status:   Discontinued     750 mg Oral Daily 09/15/14 0751 09/18/14 1201   09/15/14 0800  metroNIDAZOLE (FLAGYL) tablet 500 mg  Status:  Discontinued     500 mg Oral 3 times per day 09/15/14 0751 09/18/14 1201   09/14/14 1000  metroNIDAZOLE (FLAGYL) IVPB 500 mg  Status:  Discontinued     500 mg 100 mL/hr over 60 Minutes Intravenous Every 8 hours 09/14/14 0845 09/15/14 0751   09/14/14 1000  levofloxacin (LEVAQUIN) IVPB 750 mg  Status:  Discontinued     750 mg 100 mL/hr over 90 Minutes Intravenous Every 24 hours 09/14/14 0847 09/15/14 0751   09/14/14 0900  vancomycin (VANCOCIN) 500 mg in sodium chloride 0.9 % 100 mL IVPB  Status:  Discontinued     500 mg 100 mL/hr over 60 Minutes Intravenous Every 8 hours 09/14/14 0850 09/15/14 1301      Objective: Filed Weights   09/25/14 0600 09/26/14 0537 09/28/14 0642  Weight: 56.6 kg (124 lb 12.5 oz) 56.473 kg (124 lb 8 oz) 50.8 kg (111 lb 15.9 oz)    Intake/Output Summary (Last 24 hours) at 09/28/14 1020 Last data filed at 09/28/14 0500  Gross per 24 hour  Intake    110 ml  Output   1550 ml  Net  -1440 ml     Vitals Filed Vitals:   09/28/14 0125 09/28/14 0452 09/28/14 0642 09/28/14 0700  BP:      Pulse: 64 69    Temp:  98.1 F (36.7 C)    TempSrc:  Oral    Resp: 18 18    Height:      Weight:   50.8 kg (111 lb 15.9 oz)   SpO2: 95% 94%  94%    Exam:  General:  Pt appears to be in some discomfort. in no distress. on high flow nasal cannula  Cardiovascular: S1/S2 normal, regular. No murmur  Respiratory: No wheezing. No rhonchi. Improved air entry bilaterally  Abdomen: Soft, right flank area does not reveal any obvious abnormality. Slightly tender to palpation. No bruising. Otherwise abdomen is benign.  MSK: No LE edema, cyanosis or clubbing  Data Reviewed: Basic Metabolic Panel:  Recent Labs Lab 09/23/14 0550 09/24/14 0550 09/25/14 0345 09/27/14 0550 09/28/14 0443  NA 132* 130* 132* 127* 131*  K 4.5 4.9 4.9 4.6 4.8  CL 97 96  98 98 101  CO2 26 29 25 22 23   GLUCOSE 129* 205* 113* 103* 136*  BUN 9 12 11 18 19   CREATININE 0.43* 0.57 0.47* 0.51 0.56  CALCIUM 8.5 8.5 8.8 9.0 8.9    CBC:  Recent Labs Lab 09/24/14 0550 09/25/14 0345 09/26/14 0425 09/27/14 0550 09/28/14 0443  WBC 15.6* 16.0* 16.7* 20.5* 19.3*  HGB 11.4* 11.5* 12.2 12.7 11.7*  HCT 35.0* 35.9* 37.7 38.9 36.7  MCV 85.2 85.1 85.1 85.1 85.3  PLT 377 422* 472* 497* 473*  BNP (last 3 results)  Recent Labs  09/22/14 0405  BNP 301.5*    No results found for this or any previous visit (from the past 240 hour(s)).   Scheduled Meds:  Scheduled Meds: . acidophilus  1 capsule Oral Daily  . antiseptic oral rinse  7 mL Mouth Rinse BID  . antiseptic oral rinse  7 mL Mouth Rinse q12n4p  . chlorhexidine  15 mL Mouth Rinse BID  . dexamethasone  8 mg Oral 3 times per day  . dextromethorphan-guaiFENesin  1 tablet Oral BID  . enoxaparin (LOVENOX) injection  80 mg Subcutaneous Q24H  . feeding supplement (ENSURE ENLIVE)  237 mL Oral TID BM  . feeding supplement (RESOURCE BREEZE)  1 Container Oral TID BM  . fentaNYL  100 mcg Transdermal Q72H  . ipratropium  0.5 mg Nebulization QID  . levalbuterol  0.63 mg Nebulization QID  . levofloxacin  500 mg Oral Daily  . megestrol  400 mg Oral BID  . metoprolol tartrate  25 mg Oral BID  . morphine  30 mg Oral Q12H  . nicotine  14 mg Transdermal Daily  . ondansetron  4 mg Oral Q6H   Or  . ondansetron (ZOFRAN) IV  4 mg Intravenous Q6H  . pantoprazole  40 mg Oral BID  . polyethylene glycol  17 g Oral Daily  . senna-docusate  1 tablet Oral QHS  . sodium chloride  3 mL Intravenous Q12H   Continuous Infusions:    Bonnielee Haff, MD [phone]  09/28/2014, 10:20 AM  LOS: 17 days   [facility] Office  [phone] Pager - Text Page per [email or web address]  If 7PM-7AM, please contact night-coverage [email or web address]

## 2014-09-28 NOTE — Progress Notes (Signed)
Chaplain visited as follow up. Ms Bangerter declined a chaplain visit, siting her great pain.  Sallee Lange. Kishon Garriga, [place]

## 2014-09-29 ENCOUNTER — Inpatient Hospital Stay (HOSPITAL_COMMUNITY): Payer: 59

## 2014-09-29 DIAGNOSIS — R531 Weakness: Secondary | ICD-10-CM

## 2014-09-29 DIAGNOSIS — T402X5A Adverse effect of other opioids, initial encounter: Secondary | ICD-10-CM

## 2014-09-29 DIAGNOSIS — K5909 Other constipation: Secondary | ICD-10-CM

## 2014-09-29 LAB — CBC
HCT: 38.7 % (ref 36.0–46.0)
Hemoglobin: 12.6 g/dL (ref 12.0–15.0)
MCH: 27.8 pg (ref 26.0–34.0)
MCHC: 32.6 g/dL (ref 30.0–36.0)
MCV: 85.4 fL (ref 78.0–100.0)
Platelets: 420 10*3/uL — ABNORMAL HIGH (ref 150–400)
RBC: 4.53 MIL/uL (ref 3.87–5.11)
RDW: 17 % — AB (ref 11.5–15.5)
WBC: 22.7 10*3/uL — ABNORMAL HIGH (ref 4.0–10.5)

## 2014-09-29 LAB — BASIC METABOLIC PANEL
Anion gap: 7 (ref 5–15)
BUN: 21 mg/dL (ref 6–23)
CO2: 22 mmol/L (ref 19–32)
CREATININE: 0.56 mg/dL (ref 0.50–1.10)
Calcium: 9.5 mg/dL (ref 8.4–10.5)
Chloride: 102 mmol/L (ref 96–112)
GFR calc Af Amer: >90 mL/min (ref >90)
Glucose, Bld: 147 mg/dL — ABNORMAL HIGH (ref 70–99)
Potassium: 5.1 mmol/L (ref 3.5–5.1)
Sodium: 131 mmol/L — ABNORMAL LOW (ref 135–145)

## 2014-09-29 MED ORDER — BISACODYL 10 MG RE SUPP
10.0000 mg | RECTAL | Status: AC
  Administered 2014-09-29: 10 mg via RECTAL
  Filled 2014-09-29: qty 1

## 2014-09-29 MED ORDER — MORPHINE SULFATE ER 30 MG PO TBCR
30.0000 mg | EXTENDED_RELEASE_TABLET | Freq: Three times a day (TID) | ORAL | Status: DC
  Administered 2014-09-29 – 2014-09-30 (×4): 30 mg via ORAL
  Filled 2014-09-29 (×4): qty 1

## 2014-09-29 MED ORDER — HYDROMORPHONE HCL 1 MG/ML IJ SOLN: 0.5000 mg | INTRAMUSCULAR | Status: DC | PRN

## 2014-09-29 MED ORDER — ENOXAPARIN SODIUM 80 MG/0.8ML ~~LOC~~ SOLN
75.0000 mg | SUBCUTANEOUS | Status: DC
  Administered 2014-09-29: 75 mg via SUBCUTANEOUS
  Filled 2014-09-29 (×2): qty 0.8

## 2014-09-29 NOTE — Evaluation (Signed)
Occupational Therapy Evaluation Patient Details Name: [name] MRN: [id number] DOB: 17-May-1966 Today's Date: 09/29/2014    History of Present Illness [name] is a 49 y.o. female with Past medical history of left lung cancer status post chemoradiation September 2015. Admitte   09/10/14 with c/o chest pain., recurrence of lung cancer. Occurence of afib, s/p EGD on 09/14/14.   Clinical Impression   Pt with very limited activity tolerance and reported feeling fatigued after pivot only to chair. O2 sats dropped to 85% on 5L O2. Nursing in room and aware. Increased to 93% with rest and encouragement of PLB. Pt didn't give much information when asked about home setup for bathroom but did state she did her own ADL. Will follow. Recommend SNF but if pt refuses, will need 24/7 assist and HHOT    Follow Up Recommendations  SNF;Supervision/Assistance - 24 hour (if pt decides d/c home recommend 24/7 and HHOT)    Equipment Recommendations  3 in 1 bedside comode    Recommendations for Other Services       Precautions / Restrictions Precautions Precautions: Fall Precaution Comments: monitor  sats Restrictions Weight Bearing Restrictions: No      Mobility Bed Mobility Overal bed mobility: Needs Assistance       Supine to sit: Min guard        Transfers Overall transfer level: Needs assistance Equipment used: Rolling walker (2 wheeled) Transfers: Sit to/from Stand Sit to Stand: Min assist         General transfer comment: cues for hand placement.    Balance                                            ADL Overall ADL's : Needs assistance/impaired Eating/Feeding: Independent;Sitting   Grooming: Wash/dry hands;Set up;Sitting   Upper Body Bathing: Set up;Supervision/ safety;Sitting   Lower Body Bathing: Moderate assistance;Sit to/from stand   Upper Body Dressing : Minimal assistance;Sitting   Lower Body Dressing: Moderate  assistance;Sit to/from stand   Toilet Transfer: Minimal assistance;Stand-pivot;RW   Toileting- Clothing Manipulation and Hygiene: Maximal assistance;Sit to/from stand         General ADL Comments: Pt was fatigued after only a pivot to the recliner. Increased time to take a few side steps around to chair with walker. O2 sats dropped to 85% on 5L after pivot and nursing in room and aware. pt was vague about home setup information and  states, "I just moved from [place] her wtih my brother." She did state she did own ADL.      Vision     Perception     Praxis      Pertinent Vitals/Pain Pain Assessment: 0-10 Pain Score: 4  Pain Location: chest Pain Descriptors / Indicators: Discomfort Pain Intervention(s): Patient requesting pain meds-RN notified;Repositioned     Hand Dominance     Extremity/Trunk Assessment Upper Extremity Assessment Upper Extremity Assessment: Generalized weakness           Communication Communication Communication: No difficulties   Cognition Arousal/Alertness: Awake/alert Behavior During Therapy: WFL for tasks assessed/performed Overall Cognitive Status: Within Functional Limits for tasks assessed                     General Comments       Exercises       Shoulder Instructions      Home Living  Family/patient expects to be discharged to:: Private residence Living Arrangements: Other relatives Available Help at Discharge: Family Type of Home: House Home Access: Ramped entrance;Stairs to enter Technical brewer of Steps: 3 Entrance Stairs-Rails: Right Home Layout: Two level;Bed/bath upstairs Alternate Level Stairs-Number of Steps: has a stair glide             Home Equipment: Shower seat   Additional Comments: just moved in with brother   recently      Prior Functioning/Environment Level of Independence: Independent        Comments: pt states she did her own ADL but did not elaborate on bathroom set up when asked  questions.     OT Diagnosis: Generalized weakness   OT Problem List: Decreased strength;Decreased knowledge of use of DME or AE;Decreased activity tolerance   OT Treatment/Interventions: Self-care/ADL training;Patient/family education;Therapeutic activities;DME and/or AE instruction    OT Goals(Current goals can be found in the care plan section) Acute Rehab OT Goals Patient Stated Goal: pt agreeable up to chair OT Goal Formulation: With patient Time For Goal Achievement: 10/13/14 Potential to Achieve Goals: Good  OT Frequency: Min 2X/week   Barriers to D/C:            Co-evaluation              End of Session Equipment Utilized During Treatment: Rolling walker;Oxygen  Activity Tolerance: Patient limited by fatigue;Other (comment) (drop of O2 sats) Patient left: in chair;with call bell/phone within reach;with chair alarm set   Time: [id number] OT Time Calculation (min): 27 min Charges:  OT General Charges $OT Visit: 1 Procedure OT Evaluation $Initial OT Evaluation Tier I: 1 Procedure OT Treatments $Therapeutic Activity: 8-22 mins  [phone] G-Codes:    Jules Schick 09/29/2014, 10:28 AM

## 2014-09-29 NOTE — Progress Notes (Signed)
INITIAL NUTRITION ASSESSMENT  DOCUMENTATION CODES Per approved criteria  -Severe malnutrition in the context of chronic illness   INTERVENTION: Continue Resource Breeze po TID, each supplement provides 250 kcal and 9 grams of protein  Will d/c Ensure Enlive po BID, each supplement provides 350 kcal and 20 grams of protein, per pt request Will order Magic Cup BID, each supplement provides 290 kcal and 9 grams of protein Continue Megace Encourage PO intake at meals and will assist with selection as needed  NUTRITION DIAGNOSIS: Severe malnutrition related to chronic illness, recurrent lung cancer as evidenced by 12% body weight loss in 8 months, less than 50% needed PO intakes for at least 1 month. -ongoing  Goal: Pt to meet >/= 90% estimated needs -currently unmet  Monitor:  Meal and supplement intakes,tolerance of Soft diet, weight trends, labs   49 y.o. female  Admitting Dx: Cancer associated pain  ASSESSMENT: 49 y.o female admitted 09/10/14 with hx of lung cancer status post chemo and radiation which ended 2/16. Notes indicate pt with poor appetite PTA.   Pt not eating much and with Palliative care consult per rounds this AM. The following indicates major occurences since admission: 3/24 Megace ordered to help with anorexia. 3/25 NGT placed with 200 mL dark red drainage; NGT removed the next day. 3/25 Ensure Enlive ordered TID 3/27 Severe erosive esophagitis found on EGD with no active bleeding and thought to be result of previous radiation treatments. 4/5 R IJ power port removed and RUE power PICC in place for PIV access. 4/7 case management note indicates that plan to d/c home 09/28/14. 4/10 weaned from non-rebreather to 6L via [place] 4/11 per rounds, plan to d/c home tomorrow if able to wean from O2  Pt reports good tolerance of Soft diet and she is happy to have more choices than she did on FLD. She reports ongoing pain and nausea but that eating does not exacerbate these  things. She would like Ensure Enlive to be d/c'ed but has been intermittently drinking Facilities manager. She is willing to try Magic Cup BID. Pt reports improving appetite. She recently woke up and has not yet had breakfast; RN reports this and that breakfast will be ordered for pt.  Per rounds this AM, PT to work with pt and hopeful wean of O2 with plans to d/c pt to home tomorrow. Will continue to monitor POC and needs.   No intakes documented since last assessment but with improving appetite and intakes pt likely still not meeting needs. Megace order continues.   Height: Ht Readings from Last 1 Encounters:  09/15/14 $RemoveB'5\' 7"'VnRuFOgD$  (1.702 m)    Weight: Wt Readings from Last 1 Encounters:  09/29/14 110 lb 1.6 oz (49.941 kg)    Ideal Body Weight: 135 lbs (61.36 kg)  % Ideal Body Weight: 92%  Wt Readings from Last 10 Encounters:  09/29/14 110 lb 1.6 oz (49.941 kg)    Usual Body Weight: 140 lbs (63.64 kg)  % Usual Body Weight: 88%  BMI:  Body mass index is 17.24 kg/(m^2).  Estimated Nutritional Needs: Kcal: 1700-2000 Protein: 85-105 grams Fluid: 2L/day  Skin: ecchymosis to bilateral arms  Diet Order: DIET SOFT Room service appropriate?: Yes; Fluid consistency:: Thin  EDUCATION NEEDS: Education needs met at this time   Intake/Output Summary (Last 24 hours) at 09/29/14 1017 Last data filed at 09/29/14 0501  Gross per 24 hour  Intake    120 ml  Output   1550 ml  Net  -1430 ml  Last BM: no documentation at this time -ongoing  Labs:   Recent Labs Lab 09/27/14 0550 09/28/14 0443 09/29/14 0540  NA 127* 131* 131*  K 4.6 4.8 5.1  CL 98 101 102  CO2 $Re'22 23 22  'kpS$ BUN $R'18 19 21  'cE$ CREATININE 0.51 0.56 0.56  CALCIUM 9.0 8.9 9.5  GLUCOSE 103* 136* 147*    CBG (last 3)  No results for input(s): GLUCAP in the last 72 hours.  Scheduled Meds: . acidophilus  1 capsule Oral Daily  . antiseptic oral rinse  7 mL Mouth Rinse BID  . antiseptic oral rinse  7 mL Mouth Rinse q12n4p   . chlorhexidine  15 mL Mouth Rinse BID  . dexamethasone  8 mg Oral 3 times per day  . dextromethorphan-guaiFENesin  1 tablet Oral BID  . docusate sodium  100 mg Oral BID  . enoxaparin (LOVENOX) injection  80 mg Subcutaneous Q24H  . feeding supplement (RESOURCE BREEZE)  1 Container Oral TID BM  . fentaNYL  100 mcg Transdermal Q72H  . ipratropium  0.5 mg Nebulization QID  . levalbuterol  0.63 mg Nebulization QID  . levofloxacin  500 mg Oral Daily  . megestrol  400 mg Oral BID  . metoprolol tartrate  25 mg Oral BID  . morphine  30 mg Oral Q12H  . nicotine  14 mg Transdermal Daily  . ondansetron  4 mg Oral Q6H   Or  . ondansetron (ZOFRAN) IV  4 mg Intravenous Q6H  . pantoprazole  40 mg Oral BID  . polyethylene glycol  17 g Oral TID  . senna-docusate  1 tablet Oral QHS  . sodium chloride  3 mL Intravenous Q12H    Continuous Infusions:    Past Medical History  Diagnosis Date  . Cancer     left bronchioles    Past Surgical History  Procedure Laterality Date  . Esophagogastroduodenoscopy N/A 09/14/2014    Procedure: ESOPHAGOGASTRODUODENOSCOPY (EGD);  Surgeon: Teena Irani, MD;  Location: [facility];  Service: Endoscopy;  Laterality: N/A;    Jarome Matin, RD, LDN Inpatient Clinical Dietitian Pager # [phone] After hours/weekend pager # [phone]

## 2014-09-29 NOTE — Progress Notes (Signed)
Called into room by Palliative Care NP to discuss Pt's plan of care mostly related to pain management, diet and activity. Discussion with Pt and Brother regarding Pain Medications that are scheduled and Prn pain medications. Pt encouraged to notify Nursing if PRN pain medications are needed. Pt also assured Nursing will frequently check with Pt regarding Pt's degree of pain and if medications need to be adjusted Palliative Care or her Primary MD will be notified. Discussion also with Pt and Brother regarding Diet and what Pt's preferences are. Dietary has seen Pt this AM. Pt remains very tearful and needs frequent reassurance. PRN Pain medication given at this time and pain level will be reevaluated. Pt again instructed to please call for any further needs

## 2014-09-29 NOTE — Progress Notes (Signed)
Pt requesting to sit on BSC to try having BM. Pain level assessed prior to getting pt out of bed Pt states pain level "4". Assisted up to [facility]

## 2014-09-29 NOTE — Progress Notes (Signed)
Clinical Social Work Department BRIEF PSYCHOSOCIAL ASSESSMENT 09/29/2014  Patient:  [name], [name]     Account Number:  [id number]     Admit date:  09/10/2014  Clinical Social Worker:  Renold Genta  Date/Time:  09/29/2014 02:40 PM  Referred by:  Physician  Date Referred:  09/29/2014 Referred for  SNF Placement   Other Referral:   Interview type:  Patient Other interview type:   left message for patient's brother, [name] (ph#: [phone])    PSYCHOSOCIAL DATA Living Status:  [place] Admitted from facility:   Level of care:   Primary support name:  Howell Rucks (brother) ph#: [phone] Primary support relationship to patient:  SIBLING Degree of support available:   good    CURRENT CONCERNS Current Concerns  Post-Acute Placement   Other Concerns:    SOCIAL WORK ASSESSMENT / PLAN CSW received consult from [facility], Juliann Pulse that patient is open to SNF placement if covered by her insurance [facility]).   Assessment/plan status:  Information/Referral to [employer] Other assessment/ plan:   Information/referral to community resources:   CSW completed FL2 and faxed information out to [facility] - provided SNF bed offers.    PATIENT'S/FAMILY'S RESPONSE TO PLAN OF CARE: Patient was not very familiar with the area, as she recently moved here from [place] though she has been living with her brother in [place]. CSW provided SNF bed offers ([facility] [facility]) [facility] is closer to [place] so brother chose them. CSW made Narda Rutherford at [facility] aware, awaiting insurance authorization.          Raynaldo Opitz, [facility] Clinical Social Worker cell #: [phone]

## 2014-09-29 NOTE — Progress Notes (Signed)
Pt's pain assessed  And Pt stated Pain level is a "4". Scheduled Pain medication given

## 2014-09-29 NOTE — Progress Notes (Signed)
Pt had results from suppository One large soft brown stool

## 2014-09-29 NOTE — Progress Notes (Signed)
ANTICOAGULATION CONSULT NOTE - Follow Up Consult  Pharmacy Consult for Lovenox Indication: SVC thrombus  Allergies  Allergen Reactions  . Penicillins Rash    Patient Measurements: Height: 5\' 7"  (170.2 cm) Weight: 110 lb 1.6 oz (49.941 kg) IBW/kg (Calculated) : 61.6  Vital Signs: Temp: 97.5 F (36.4 C) (04/11 0500) Temp Source: Oral (04/11 0500) BP: 105/72 mmHg (04/11 0938) Pulse Rate: 64 (04/11 0938)  Labs:  Recent Labs  09/27/14 0550 09/28/14 0443 09/29/14 0540  HGB 12.7 11.7* 12.6  HCT 38.9 36.7 38.7  PLT 497* 473* 420*  CREATININE 0.51 0.56 0.56    Estimated Creatinine Clearance: 68.5 mL/min (by C-G formula based on Cr of 0.56).   Medications:  Scheduled:  . acidophilus  1 capsule Oral Daily  . antiseptic oral rinse  7 mL Mouth Rinse BID  . antiseptic oral rinse  7 mL Mouth Rinse q12n4p  . chlorhexidine  15 mL Mouth Rinse BID  . dexamethasone  8 mg Oral 3 times per day  . dextromethorphan-guaiFENesin  1 tablet Oral BID  . docusate sodium  100 mg Oral BID  . enoxaparin (LOVENOX) injection  80 mg Subcutaneous Q24H  . feeding supplement (RESOURCE BREEZE)  1 Container Oral TID BM  . fentaNYL  100 mcg Transdermal Q72H  . ipratropium  0.5 mg Nebulization QID  . levalbuterol  0.63 mg Nebulization QID  . megestrol  400 mg Oral BID  . metoprolol tartrate  25 mg Oral BID  . morphine  30 mg Oral Q12H  . nicotine  14 mg Transdermal Daily  . ondansetron  4 mg Oral Q6H   Or  . ondansetron (ZOFRAN) IV  4 mg Intravenous Q6H  . pantoprazole  40 mg Oral BID  . polyethylene glycol  17 g Oral TID  . senna-docusate  1 tablet Oral QHS  . sodium chloride  3 mL Intravenous Q12H   Infusions:    Assessment: 48YOF admitted on 3/24 for pain control due to left-sided chest pain related to metastatic lung cancer.  She developed an upper GI bleed this admission. Porta-cath evaluated via fluoroscopy and showed + SVC thrombosis on 4/5. Porta-cath was removed and pharmacy was  initially consulted to dose IV heparin.  Pharmacy was consulted to change to Lovenox on 4/8 per Oncology recommendation for long-term use at discharge.  Today, 09/29/2014:  CBC: Hgb remains WNL, Plt elevated  SCr stable  No bleeding/complications reported  Weight: admission weight 58 kg, increased to max of 66kg on 4/3, but has since decreased to new low of 49.9 kg today, 4/11.  Note: patient refused her Lovenox dose on 4/10.   Goal of Therapy:  Anti-Xa level 0.6-1 units/ml 4hrs after LMWH dose given Monitor platelets by anticoagulation protocol: Yes   Plan:   Lovenox 75 mg SQ once daily.  Dose remains 1.5 mg/kg/day; dose reduction due to change in weight.  Follow up renal function, CBC, and weight changes.   Gretta Arab PharmD, BCPS Pager [phone] 09/29/2014 10:50 AM

## 2014-09-29 NOTE — Progress Notes (Signed)
Per orders Doculax. Supp given. Pain reassessed  Pt stated pain level is a 4 at this time. Pt resting in bed with no obvious signs of distress. Will continue to monitor closely

## 2014-09-29 NOTE — Progress Notes (Signed)
Physical Therapy Treatment Patient Details Name: [name] MRN: [id number] DOB: [birth year] Today's Date: 09/29/2014    History of Present Illness [name] is a 49 y.o. female with Past medical history of left lung cancer status post chemoradiation September 2015. Admitte   09/10/14 with c/o chest pain., recurrence of lung cancer. Occurence of afib, s/p EGD on 09/14/14.    PT Comments    Patient tearful, reports that she  Plans  To go to rehab. Patient on 5 l. O2 with sats 100% in bed, encouraged patient to mobilize to  [facility] of bed. Stood and took 4 side steps while on RA, sats 89%, , increased SOB noted. Patient will benefit from ST snf.  Follow Up Recommendations  SNF;Supervision/Assistance - 24 hour     Equipment Recommendations  Rolling walker with 5" wheels;3in1 (PT)    Recommendations for Other Services       Precautions / Restrictions Precautions Precautions: Fall Precaution Comments: monitor  sats, on O2, L rib pain/mets    Mobility  Bed Mobility Overal bed mobility: Needs Assistance Bed Mobility: Supine to Sit;Sit to Supine     Supine to sit: Mod assist Sit to supine: Min assist   General bed mobility comments: MOD ASSIST THIS SESSION WITH  BED FLAT, PATIENT REPORTS Iincreased side pain  and required assist for trunk up right. multimodal cues for going down to side  to return to supine.  Transfers Overall transfer level: Needs assistance Equipment used: Rolling walker (2 wheeled) Transfers: Sit to/from Stand Sit to Stand: Min assist         General transfer comment: cues for hand placement., from raised bed.  Ambulation/Gait Ambulation/Gait assistance: Min assist           General Gait Details: 5 side steps along bed with RW, extra time to work through pain.   Stairs            Wheelchair Mobility    Modified Rankin (Stroke Patients Only)       Balance                                    Cognition  Arousal/Alertness: Awake/alert Behavior During Therapy:  (TEARFUL)                        Exercises      General Comments        Pertinent Vitals/Pain Pain Score: 8  Pain Location: L chest Pain Descriptors / Indicators: Cramping;Grimacing;Guarding;Crying Pain Intervention(s): Limited activity within patient's tolerance;Premedicated before session;Repositioned    Home Living                      Prior Function            PT Goals (current goals can now be found in the care plan section) Progress towards PT goals: Progressing toward goals    Frequency  Min 3X/week    PT Plan Discharge plan needs to be updated, recommend SNF, if not then 24/7 caregivers and [facility]    Co-evaluation             End of Session   Activity Tolerance: No increased pain Patient left: in bed;with call bell/phone within reach;with bed alarm set     Time: 1434-1452 PT Time Calculation (min) (ACUTE ONLY): 18 min  Charges:  $Gait Training: 8-22 mins  G Codes:      [name] 09/29/2014, 3:35 PM [name] PT [phone]

## 2014-09-29 NOTE — Progress Notes (Signed)
Clinical Social Work Department CLINICAL SOCIAL WORK PLACEMENT NOTE 09/29/2014  Patient:  [name], [name]  Account Number:  [id number] Admit date:  09/10/2014  Clinical Social Worker:  Renold Genta  Date/time:  09/29/2014 02:47 PM  Clinical Social Work is seeking post-discharge placement for this patient at the following level of care:   SKILLED NURSING   (*CSW will update this form in Epic as items are completed)   09/29/2014  Patient/family provided with [place] Department of Clinical Social Work's list of facilities offering this level of care within the geographic area requested by the patient (or if unable, by the patient's family).  09/29/2014  Patient/family informed of their freedom to choose among providers that offer the needed level of care, that participate in Medicare, Medicaid or managed care program needed by the patient, have an available bed and are willing to accept the patient.  09/29/2014  Patient/family informed of [facility]' ownership interest in [facility], as well as of the fact that they are under no obligation to receive care at this facility.  PASARR submitted to EDS on 09/29/2014 PASARR number received on 09/29/2014  FL2 transmitted to all facilities in geographic area requested by pt/family on  09/29/2014 FL2 transmitted to all facilities within larger geographic area on   Patient informed that his/her managed care company has contracts with or will negotiate with  certain facilities, including the following:   [facility] Choice [facility]     Patient/family informed of bed offers received:  09/29/2014 Patient chooses bed at  Physician recommends and patient chooses bed at    Patient to be transferred to  on   Patient to be transferred to facility by  Patient and family notified of transfer on  Name of family member notified:    The following physician request were entered in Epic:   Additional Comments:    Raynaldo Opitz,  [place] Social Worker cell #: [phone]

## 2014-09-29 NOTE — Progress Notes (Signed)
[facility] Progress Note   [name] OBS:[id number] DOB: March 24, 1966 DOA: 09/10/2014  PCP: No primary care provider on file. . She is new to this area. She is to follow-up with Dr. Julien Nordmann as an outpatient.  Brief narrative: [name] is a 49 y.o. female with Lung cancer who was treated for it in [place]. She was told recently after a PET scan that she had a mass in her lung and needed further treatment but she decided to move here with her brother to help care for her. Her records were sent to Dr Julien Nordmann. She presented to the hospital with ongoing severe left chest pain and was admitted for pain control. 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 Patient also developed A. fib with RVR, due to acute illness. Seen by cardiology. Placed on Amiodarone and back to normal sinus rhythm. Not anti-coagulation candidate. Continues to have poor respiratory status giving her pneumonia. Pulmonary service following. Patient had Port-A-Cath fluoroscopy to evaluate for malfunction, there was evidence of SVC thrombosis. Port-A-Cath was subsequently removed. She was started on IV heparin. Subsequently switched over to subcutaneous Lovenox.  Subjective: Patient states that the pain is better. Hasn't had a bowel movement yet. Breathing is improved.   Assessment/Plan:  Acute hypoxic resp failure -  Secondary to lung cancer, previous lung radiation, and COPD (no oxygen requirement though), HCAP. Slowly improving. - She was on broad-spectrum antibiotics for aspiration pneumonia. Antibiotics were de-escalate it to Levaquin and metronidazole 4/8. She has significant nausea which could be due to the metronidazole. This was discontinued as she had completed more than 7 days of treatment.  - Initially on IV Lasix for volume overload, currently stopped giving no  further evidence of volume overload  - CT Chest angiogram negative for PE. - Was initially requiring BiPAP at bedtime. Transitioned from Ventimask to high flow nasal cannula so she can tolerate eating and taking oral medications. Continue to titrate down to nasal cannula if possible. Unclear if this has been attempted on not. - Has loculated pleural effusion, discussed with IR, unable to drain by ultrasound. Previous rounding MD also discussed with CT surgery. They don't think she would be a candidate for any intervention at this point. Pulmonology has also discussed with the specialists and again no intervention to be done at this time.  - She is finally off of high flow oxygen. She is now on nasal cannula. Repeat chest x-ray.  SVC thrombosis. - Patient had malfunctioning Port-A-Cath, fluoroscopy showing SVC thrombosis. - Her Port-A-Cath was removed. - Risks and benefits of anticoagulation were discussed with patient. Subsequently intravenous heparin was initiated after discussions with GI. Monitor closely for signs of bleeding. Hemoglobin is stable. No bleeding noted. - After discussions with the oncology patient was changed over to Lovenox from IV heparin. This will be continued even at home.   Cancer associated pain - She has been on narcotics for months for pain starting at her left lower rib cage and radiating upwards towards axilla- the pain was severe even prior to coming to [facility] per brother and patient.  - Has a lesion in left post 9th rib which is the only imaging finding that could be casing her pain - Palliative care is following for pain management. - Started on decadron to help with chest wall metastasis.  - Worsening pain in the right flank area on 4/10. CT scan of the abdomen and  pelvis revealed significant stool burden in the right colon which could be the reason for her pain. Her dose of laxative was increased. She is also on stool softeners. Await bowel movement. Pain is  improved today.  HCAP (aspiration pneumonia) with loculated pleural effusion - Overall stable. At admission she may have aspirated on vomitus (hematemesis), started on HCAP coverage (used Vanc/ Levaquin and Flagyl) on 3/27 as symptoms started on her 5th day in the hospital  - Patient had deterioration of respiratory status 3/29, CT chest showing significant progression of her pneumonia, so currently on vancomycin/cefepime/Flagyl - Continue with pulmonary toilet and nebs as needed. - Clinically she is improved. Metronidazole was discontinued due to nausea. Stop Levaquin after today's dose.  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 Abdominal film did not suggest obstruction. Flagyl has been discontinued. Nausea is better.  Paroxysmal A-fib - Initially on Cardizem drip, then amiodarone drip, transitioned to oral amiodarone, back to normal sinus rhythm. Due to concerns for pulmonary toxicity Amiodarone discontinued. Cardiology was following. She did have a recurrence of atrial fibrillation on 4/8, but it resolved with the IV metoprolol. She is now in sinus rhythm. She was placed on oral metoprolol. - ECHO does not reveal underlying cardiac issues - [place] Vasc score of 1.  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 - CT showing left chest wall involvement - Dr Julien Nordmann saw the patient during this hospitalization. He will follow her as an outpatient. Prognosis is unclear.  - Persistent leukocytosis, likely secondary to  steroids.  Hyponatremia Likely due to some degree of hypovolemia. Improved with normal saline. SIADH is also a possibility.  Anorexia-  - Ensure and Megace. Advanced diet.  Ongoing smoking - Nicotine patch  Severe protein calorie malnutrition Due to cancer. Continue nutritional supplements.  DVT prophylaxis:  On therapeutic dose Lovenox Code Status: full code Family Communication: discussed with patient Disposition Plan:  PT and OT. Taper oxygen as much as possible. Mobilize.   Consultants: GI, pulmonary , cardiology, oncology  Procedures:  1. EGD: very severe esophagitis possibly due to previous radiation combined with reflux  2. ECHO:  Left ventricle: LVEF is grossly normal at approximately 60%.Diffiuclt to fully evaluate regional wall motion as endocardiumis difficult to see well. The cavity size was normal. Wallthickness was normal. - Pulmonary arteries: PA peak pressure: 34 mm Hg (S).  Antibiotics: Anti-infectives    Start     Dose/Rate Route Frequency Ordered Stop   09/26/14 1400  metroNIDAZOLE (FLAGYL) tablet 500 mg  Status:  Discontinued     500 mg Oral 3 times per day 09/26/14 0959 09/27/14 0916   09/26/14 1100  levofloxacin (LEVAQUIN) tablet 500 mg     500 mg Oral Daily 09/26/14 0959     09/18/14 2000  vancomycin (VANCOCIN) IVPB 1000 mg/200 mL premix  Status:  Discontinued     1,000 mg 200 mL/hr over 60 Minutes Intravenous Every 8 hours 09/18/14 1918 09/26/14 0959   09/18/14 1400  metroNIDAZOLE (FLAGYL) IVPB 500 mg  Status:  Discontinued     500 mg 100 mL/hr over 60 Minutes Intravenous Every 8 hours 09/18/14 1305 09/26/14 0959   09/17/14 0400  vancomycin (VANCOCIN) IVPB 750 mg/150 ml premix  Status:  Discontinued     750 mg 150 mL/hr over 60 Minutes Intravenous Every 8 hours 09/17/14 0159 09/18/14 1918   09/17/14 0300  ceFEPIme (MAXIPIME) 1 g in dextrose 5 % 50 mL IVPB  Status:  Discontinued     1 g 100 mL/hr over 30 Minutes Intravenous 3 times per day  09/17/14 0154 09/26/14 0959   09/15/14 1000  levofloxacin (LEVAQUIN) tablet 750 mg  Status:  Discontinued     750 mg Oral Daily 09/15/14 0751 09/18/14 1201   09/15/14 0800  metroNIDAZOLE (FLAGYL) tablet 500 mg  Status:  Discontinued     500 mg Oral 3 times per day 09/15/14 0751 09/18/14 1201   09/14/14 1000  metroNIDAZOLE (FLAGYL) IVPB 500 mg  Status:  Discontinued     500 mg 100 mL/hr over 60 Minutes Intravenous Every 8 hours 09/14/14 0845 09/15/14 0751   09/14/14 1000  levofloxacin (LEVAQUIN) IVPB 750 mg  Status:  Discontinued     750 mg 100 mL/hr over 90 Minutes Intravenous Every 24 hours 09/14/14 0847 09/15/14 0751   09/14/14 0900  vancomycin (VANCOCIN) 500 mg in sodium chloride 0.9 % 100 mL IVPB  Status:  Discontinued     500 mg 100 mL/hr over 60 Minutes Intravenous Every 8 hours 09/14/14 0850 09/15/14 1301      Objective: Filed Weights   09/26/14 0537 09/28/14 0642 09/29/14 0455  Weight: 56.473 kg (124 lb 8 oz) 50.8 kg (111 lb 15.9 oz) 49.941 kg (110 lb 1.6 oz)    Intake/Output Summary (Last 24 hours) at 09/29/14 1016 Last data filed at 09/29/14 0501  Gross per 24 hour  Intake    120 ml  Output   1550 ml  Net  -1430 ml     Vitals Filed Vitals:   09/29/14 0455 09/29/14 0500 09/29/14 0814 09/29/14 0938  BP:  103/76  105/72  Pulse:  66  64  Temp:  97.5 F (36.4 C)    TempSrc:  Oral    Resp:  18    Height:      Weight: 49.941 kg (110 lb 1.6 oz)     SpO2:  100% 99%     Exam:  General:  Patient appears to be in no distress today.   Cardiovascular: S1/S2 normal, regular. No murmur  Respiratory: No wheezing. No rhonchi. Improved air entry bilaterally  Abdomen: Soft, nontender today. Otherwise abdomen is benign.  MSK: No LE edema, cyanosis or clubbing  Data Reviewed: Basic Metabolic Panel:  Recent Labs Lab 09/24/14 0550 09/25/14 0345 09/27/14 0550 09/28/14 0443 09/29/14 0540  NA 130* 132* 127* 131* 131*  K 4.9 4.9 4.6 4.8 5.1  CL 96 98 98 101 102   CO2 29 25 22 23 22   GLUCOSE 205* 113* 103* 136* 147*  BUN 12 11 18 19 21   CREATININE 0.57 0.47* 0.51 0.56 0.56  CALCIUM 8.5 8.8 9.0 8.9 9.5    CBC:  Recent Labs Lab 09/25/14 0345 09/26/14 0425 09/27/14 0550 09/28/14 0443 09/29/14 0540  WBC 16.0* 16.7* 20.5* 19.3* 22.7*  HGB 11.5* 12.2 12.7 11.7* 12.6  HCT 35.9* 37.7 38.9 36.7 38.7  MCV 85.1 85.1 85.1 85.3 85.4  PLT 422* 472* 497* 473* 420*    BNP (last 3 results)  Recent Labs  09/22/14 0405  BNP 301.5*    No results found for this or any previous visit (from the past 240 hour(s)).   Scheduled Meds:  Scheduled Meds: . acidophilus  1 capsule Oral Daily  . antiseptic oral rinse  7 mL Mouth Rinse BID  . antiseptic oral rinse  7 mL Mouth Rinse q12n4p  . chlorhexidine  15 mL Mouth Rinse BID  . dexamethasone  8 mg Oral 3 times per day  . dextromethorphan-guaiFENesin  1 tablet Oral BID  . docusate sodium  100 mg Oral BID  . enoxaparin (LOVENOX) injection  80 mg Subcutaneous Q24H  . feeding supplement (ENSURE ENLIVE)  237 mL Oral TID BM  . feeding supplement (RESOURCE BREEZE)  1 Container Oral TID BM  . fentaNYL  100 mcg Transdermal Q72H  . ipratropium  0.5 mg Nebulization QID  . levalbuterol  0.63 mg Nebulization QID  . levofloxacin  500 mg Oral Daily  . megestrol  400 mg Oral BID  . metoprolol tartrate  25 mg Oral BID  . morphine  30 mg Oral Q12H  . nicotine  14 mg Transdermal Daily  . ondansetron  4 mg Oral Q6H   Or  . ondansetron (ZOFRAN) IV  4 mg Intravenous Q6H  . pantoprazole  40 mg Oral BID  . polyethylene glycol  17 g Oral TID  . senna-docusate  1 tablet Oral QHS  . sodium chloride  3 mL Intravenous Q12H   Continuous Infusions:    Natalie Haff, MD [phone]  09/29/2014, 10:16 AM  LOS: 18 days   [facility] Office  [phone] Pager - Text Page per [email or web address]  If 7PM-7AM, please contact night-coverage [email or web address]

## 2014-09-29 NOTE — Progress Notes (Signed)
Patient EX:HBZJIR [name]      DOB: March 12, 1966      CVE:[id number]   Palliative Medicine Team at [facility] Progress Note    Subjective:  -Patient is OOB to chair, brother at bedside  -patient has unending complaints regarding food services, nursing, medications, therapy programs. "I'm just miserable and no body cares" .  Conversation regarding the hardship of being dependant for ADL and loss of independande  -Continued conversation regarding her serious diagnosis/prognosis and the limitations of medical interventions to prolong quality life, and the imporatnce of her verbalizing her her wishes/needs in order to enhance patient centered care    Filed Vitals:   09/29/14 0952  BP:   Pulse: 103  Temp:   Resp:    Physical exam: GEN: lethargic, weak  NAD CV: RRR LUNGS: CTAB EXT: warm, no edema Psych: irritable and negative conversation   CBC    Component Value Date/Time   WBC 22.7* 09/29/2014 0540   RBC 4.53 09/29/2014 0540   HGB 12.6 09/29/2014 0540   HCT 38.7 09/29/2014 0540   PLT 420* 09/29/2014 0540   MCV 85.4 09/29/2014 0540   MCH 27.8 09/29/2014 0540   MCHC 32.6 09/29/2014 0540   RDW 17.0* 09/29/2014 0540   LYMPHSABS 0.3* 09/18/2014 0300   MONOABS 0.3 09/18/2014 0300   EOSABS 0.0 09/18/2014 0300   BASOSABS 0.0 09/18/2014 0300    CMP     Component Value Date/Time   NA 131* 09/29/2014 0540   K 5.1 09/29/2014 0540   CL 102 09/29/2014 0540   CO2 22 09/29/2014 0540   GLUCOSE 147* 09/29/2014 0540   BUN 21 09/29/2014 0540   CREATININE 0.56 09/29/2014 0540   CALCIUM 9.5 09/29/2014 0540   PROT 7.2 09/27/2014 0550   ALBUMIN 3.2* 09/27/2014 0550   AST 14 09/27/2014 0550   ALT 6 09/27/2014 0550   ALKPHOS 81 09/27/2014 0550   BILITOT 0.7 09/27/2014 0550   GFRNONAA >90 09/29/2014 0540   GFRAA >90 09/29/2014 0540     Assessment and plan: 49 yo female with PMHx of Stage IV NSCLC (SCC) s/p chemo/XRT admitted with 3/23 with worsening pleuritic chest pain  felt 2/2 malignancy. Hospital stay complciated by Afib w/RVR, Hypoxic Resp Failure, GI bleed. Palliative consulted for goals of care, symptom management as indicated and  emotional support  1. Code Status: FULL  -patient tells me "anyone who knows me knows I don't want any of that (CPR, ventilators), but unable to make definitive decsions  2. GOC:  At this time patient is hopeful for improvement and is open to all offered and available medical interventions to prolong life.  3. Symptom Management:  Cancer Related Pain:  -Reports poor control of pain for past two days -Discussed in detail her current pain management regime and her understanding of of dosing and utilization of ER and IR medications.  Very poor understanding of her current mediation and pain management stategy    Hope is for discharge to SNF for rehabilitation, goal is to eliminate IV agents    -Increase Morphine ER 30 mg po every 8 hrs    -decrease Dilaudid IV 0.5 mg to every 4 hrs-only to be used if oral agents don't work Nausea: resolved, patient requests regular diet, contacted dietary who came to bedside to assist patient with meal selection Anxiety: added Ativan 1 mg po/sl every 6 hrs prn Constipation: Dulcolax supp NOW    4. Psychosocial/Spiritual: Emotional support to patient and her brother [name].  Attempted  to explore past coping strategies, patient verbalizes  feelings of "no  one ever helped me", "I'm not stupid".  Declines chaplain support    Total Time: 75 minutes >50% of time spent in counseling and coordination of care  Time in 1100-Time out [place] NP  Palliative Medicine Team Team Phone # [phone] Pager [phone]  Discussed with Nursing Staff in detail, will document pain assessmet and patient interaction with staff

## 2014-09-29 NOTE — Progress Notes (Signed)
Physical Therapy in to assist Patient with mobility. Pt able to get out of bed with rolling walker and to chair. Pt with activity very weak and quickly became tired and short of breath. Sitting O2 sat side of bed at 90% on 5l [place] and with standing and pivoting to chair O2 sats down to 86% on 5l [place]. Placed in chair and with recovery time O2 sats on 5L 93%.

## 2014-09-30 DIAGNOSIS — T402X5A Adverse effect of other opioids, initial encounter: Secondary | ICD-10-CM

## 2014-09-30 DIAGNOSIS — K5903 Drug induced constipation: Secondary | ICD-10-CM

## 2014-09-30 DIAGNOSIS — R531 Weakness: Secondary | ICD-10-CM | POA: Insufficient documentation

## 2014-09-30 LAB — CBC
HCT: 38.4 % (ref 36.0–46.0)
Hemoglobin: 12.3 g/dL (ref 12.0–15.0)
MCH: 27.6 pg (ref 26.0–34.0)
MCHC: 32 g/dL (ref 30.0–36.0)
MCV: 86.1 fL (ref 78.0–100.0)
Platelets: 395 10*3/uL (ref 150–400)
RBC: 4.46 MIL/uL (ref 3.87–5.11)
RDW: 17 % — ABNORMAL HIGH (ref 11.5–15.5)
WBC: 17.3 10*3/uL — AB (ref 4.0–10.5)

## 2014-09-30 MED ORDER — NICOTINE 14 MG/24HR TD PT24: 14.0000 mg | MEDICATED_PATCH | Freq: Every day | TRANSDERMAL | Status: DC

## 2014-09-30 MED ORDER — POLYETHYLENE GLYCOL 3350 17 G PO PACK: 17.0000 g | PACK | Freq: Three times a day (TID) | ORAL | Status: DC

## 2014-09-30 MED ORDER — DEXAMETHASONE 4 MG PO TABS: 4.0000 mg | ORAL_TABLET | Freq: Two times a day (BID) | ORAL | Status: DC

## 2014-09-30 MED ORDER — GUAIFENESIN-DM 100-10 MG/5ML PO SYRP: 5.0000 mL | ORAL_SOLUTION | ORAL | Status: DC | PRN

## 2014-09-30 MED ORDER — ALBUTEROL SULFATE (2.5 MG/3ML) 0.083% IN NEBU: INHALATION_SOLUTION | RESPIRATORY_TRACT | Status: DC

## 2014-09-30 MED ORDER — ALUM & MAG HYDROXIDE-SIMETH 200-200-20 MG/5ML PO SUSP: 30.0000 mL | ORAL | Status: DC | PRN

## 2014-09-30 MED ORDER — FENTANYL 100 MCG/HR TD PT72: 100.0000 ug | MEDICATED_PATCH | TRANSDERMAL | Status: DC

## 2014-09-30 MED ORDER — BISACODYL 10 MG RE SUPP: 10.0000 mg | RECTAL | Status: DC | PRN

## 2014-09-30 MED ORDER — BOOST / RESOURCE BREEZE PO LIQD: 1.0000 | Freq: Three times a day (TID) | ORAL | Status: DC

## 2014-09-30 MED ORDER — LORAZEPAM 1 MG PO TABS: 1.0000 mg | ORAL_TABLET | Freq: Four times a day (QID) | ORAL | Status: DC | PRN

## 2014-09-30 MED ORDER — ENOXAPARIN SODIUM 150 MG/ML ~~LOC~~ SOLN: 75.0000 mg | SUBCUTANEOUS | Status: DC

## 2014-09-30 MED ORDER — IPRATROPIUM BROMIDE 0.02 % IN SOLN: 0.5000 mg | Freq: Four times a day (QID) | RESPIRATORY_TRACT | Status: DC

## 2014-09-30 MED ORDER — METOPROLOL TARTRATE 25 MG PO TABS: 25.0000 mg | ORAL_TABLET | Freq: Two times a day (BID) | ORAL | Status: DC

## 2014-09-30 MED ORDER — OMEPRAZOLE 20 MG PO CPDR: 20.0000 mg | DELAYED_RELEASE_CAPSULE | Freq: Two times a day (BID) | ORAL | Status: AC

## 2014-09-30 MED ORDER — SENNOSIDES-DOCUSATE SODIUM 8.6-50 MG PO TABS: 1.0000 | ORAL_TABLET | Freq: Every day | ORAL | Status: DC

## 2014-09-30 MED ORDER — DOCUSATE SODIUM 100 MG PO CAPS: 100.0000 mg | ORAL_CAPSULE | Freq: Two times a day (BID) | ORAL | Status: DC

## 2014-09-30 MED ORDER — LEVALBUTEROL HCL 0.63 MG/3ML IN NEBU: 0.6300 mg | INHALATION_SOLUTION | Freq: Four times a day (QID) | RESPIRATORY_TRACT | Status: DC

## 2014-09-30 MED ORDER — MORPHINE SULFATE 15 MG PO TABS: 15.0000 mg | ORAL_TABLET | ORAL | Status: DC | PRN

## 2014-09-30 MED ORDER — MORPHINE SULFATE ER 30 MG PO TBCR
30.0000 mg | EXTENDED_RELEASE_TABLET | Freq: Three times a day (TID) | ORAL | Status: DC
Start: 2014-09-30 — End: 2014-10-28

## 2014-09-30 MED ORDER — DM-GUAIFENESIN ER 30-600 MG PO TB12: 1.0000 | ORAL_TABLET | Freq: Two times a day (BID) | ORAL | Status: DC | PRN

## 2014-09-30 MED ORDER — IPRATROPIUM BROMIDE 0.02 % IN SOLN: 0.5000 mg | Freq: Three times a day (TID) | RESPIRATORY_TRACT | Status: DC

## 2014-09-30 MED ORDER — ONDANSETRON HCL 4 MG PO TABS: 4.0000 mg | ORAL_TABLET | Freq: Three times a day (TID) | ORAL | Status: DC | PRN

## 2014-09-30 MED ORDER — PANTOPRAZOLE SODIUM 40 MG PO TBEC: 40.0000 mg | DELAYED_RELEASE_TABLET | Freq: Two times a day (BID) | ORAL | Status: DC

## 2014-09-30 MED ORDER — ENOXAPARIN SODIUM 80 MG/0.8ML ~~LOC~~ SOLN: 80.0000 mg | SUBCUTANEOUS | Status: DC

## 2014-09-30 MED ORDER — SENNOSIDES-DOCUSATE SODIUM 8.6-50 MG PO TABS: 1.0000 | ORAL_TABLET | Freq: Every day | ORAL | Status: AC

## 2014-09-30 MED ORDER — ALBUTEROL SULFATE (2.5 MG/3ML) 0.083% IN NEBU: 2.5000 mg | INHALATION_SOLUTION | RESPIRATORY_TRACT | Status: DC | PRN

## 2014-09-30 MED ORDER — ACETAMINOPHEN 325 MG PO TABS: 650.0000 mg | ORAL_TABLET | Freq: Four times a day (QID) | ORAL | Status: AC | PRN

## 2014-09-30 MED ORDER — RISAQUAD PO CAPS
1.0000 | ORAL_CAPSULE | Freq: Every day | ORAL | Status: DC
Start: 2014-09-30 — End: 2014-09-30

## 2014-09-30 MED ORDER — RISAQUAD PO CAPS: 1.0000 | ORAL_CAPSULE | Freq: Every day | ORAL | Status: DC

## 2014-09-30 MED ORDER — ENOXAPARIN SODIUM 80 MG/0.8ML ~~LOC~~ SOLN
80.0000 mg | SUBCUTANEOUS | Status: DC
  Filled 2014-09-30: qty 0.8

## 2014-09-30 NOTE — Progress Notes (Addendum)
CSW received call from [facility], Juliann Pulse that Dr. Reynaldo Minium approved for LOG bed pending [facility]. CSW confirmed with CSW Director, [place] that 14 day stay at [facility] is approved. Patient & brother, [name] aware. Narda Rutherford at [facility] aware.   Patient is set to discharge to [facility] today. Discharge packet given to RN, Elzie Rings. [facility] called for transport.   Clinical Social Work Department CLINICAL SOCIAL WORK PLACEMENT NOTE 09/30/2014  Patient:  [name], [name]  Account Number:  [id number] Admit date:  09/10/2014  Clinical Social Worker:  Renold Genta  Date/time:  09/29/2014 02:47 PM  Clinical Social Work is seeking post-discharge placement for this patient at the following level of care:   SKILLED NURSING   (*CSW will update this form in Epic as items are completed)   09/29/2014  Patient/family provided with [place] Department of Clinical Social Work's list of facilities offering this level of care within the geographic area requested by the patient (or if unable, by the patient's family).  09/29/2014  Patient/family informed of their freedom to choose among providers that offer the needed level of care, that participate in Medicare, Medicaid or managed care program needed by the patient, have an available bed and are willing to accept the patient.  09/29/2014  Patient/family informed of [facility]' ownership interest in [facility], as well as of the fact that they are under no obligation to receive care at this facility.  PASARR submitted to EDS on 09/29/2014 PASARR number received on 09/29/2014  FL2 transmitted to all facilities in geographic area requested by pt/family on  09/29/2014 FL2 transmitted to all facilities within larger geographic area on   Patient informed that his/her managed care company has contracts with or will negotiate with  certain facilities, including the following:   [facility]     Patient/family informed of bed offers  received:  09/29/2014 Patient chooses bed at [facility] Physician recommends and patient chooses bed at    Patient to be transferred to [facility] on  09/30/2014 Patient to be transferred to facility by [facility] Patient and family notified of transfer on 09/30/2014 Name of family member notified:  patient's brother, Richard via phone  The following physician request were entered in Epic:   Additional Comments:      Raynaldo Opitz, [place] Social Worker cell #: [phone]

## 2014-09-30 NOTE — Progress Notes (Signed)
Physical Therapy Treatment Patient Details Name: [name] MRN: [id number] DOB: [birth year] Today's Date: 09/30/2014    History of Present Illness [name] is a 49 y.o. female with Past medical history of left lung cancer status post chemoradiation September 2015. Admitte   09/10/14 with c/o chest pain., recurrence of lung cancer. Occurence of afib, s/p EGD on 09/14/14.    PT Comments    Brother and sister in law present during session.  Pt on 2 lts nasal on arrival sats avg 96%.  RA O2 sats 94%.  RA during amb avg 90%.  Dyspnea 4/4.  Very limited activity tolerance.  Very weak.  SATURATION QUALIFICATIONS: (This note is used to comply with regulatory documentation for home oxygen)  Patient Saturations on Room Air at Rest = 94%  Patient Saturations on Room Air while Ambulating = 90%  Patient Saturations on            Liters of oxygen while Ambulating =       %     (did not test)  Please briefly explain why patient needs home oxygen:  Based on today's performance, pt does not qualify for O2 during activity.   Follow Up Recommendations  SNF;Supervision/Assistance - 24 hour     Equipment Recommendations  Rolling walker with 5" wheels;3in1 (PT)    Recommendations for Other Services       Precautions / Restrictions Precautions Precautions: Fall Precaution Comments: monitor  sats, on O2, L rib pain/mets Restrictions Weight Bearing Restrictions: No    Mobility  Bed Mobility Overal bed mobility: Needs Assistance Bed Mobility: Supine to Sit     Supine to sit: Max assist     General bed mobility comments: Max assist for upper body and scooting due to pain level and weakness.  Used bed pad and elevated HOB.   Transfers Overall transfer level: Needs assistance Equipment used: Rolling walker (2 wheeled) Transfers: Sit to/from Stand Sit to Stand: +2 safety/equipment;Min assist;Mod assist         General transfer comment: cues for hand placement., from  raised bed.  Ambulation/Gait Ambulation/Gait assistance: +2 safety/equipment;Min assist Ambulation Distance (Feet): 8 Feet Assistive device: Rolling walker (2 wheeled) Gait Pattern/deviations: Step-to pattern;Decreased step length - right;Decreased step length - left;Trunk flexed Gait velocity: decreased   General Gait Details: Very weak.  + 2 for safety.  MAX c/o L side pain.  MAX c/o SOB even though RA sats avg 90% however HR increased to 130's.   Very limited activity tolerance.    Stairs            Wheelchair Mobility    Modified Rankin (Stroke Patients Only)       Balance                                    Cognition Arousal/Alertness: Awake/alert   Overall Cognitive Status: Within Functional Limits for tasks assessed                      Exercises      General Comments        Pertinent Vitals/Pain Pain Assessment: 0-10 Pain Score: 8  Pain Location: L chest Pain Descriptors / Indicators: Constant;Cramping;Grimacing Pain Intervention(s): Monitored during session;Repositioned    Home Living                      Prior Function  PT Goals (current goals can now be found in the care plan section) Progress towards PT goals: Progressing toward goals    Frequency  Min 3X/week    PT Plan      Co-evaluation             End of Session Equipment Utilized During Treatment: Gait belt Activity Tolerance: Treatment limited secondary to medical complications (Comment) Patient left: in chair;with call bell/phone within reach;with family/visitor present     Time: 1335-1400 PT Time Calculation (min) (ACUTE ONLY): 25 min  Charges:  $Gait Training: 8-22 mins $Therapeutic Activity: 8-22 mins                    G Codes:      [name]  PTA [facility]  Acute  Rehab Pager      [phone]

## 2014-09-30 NOTE — Progress Notes (Signed)
Pt transferred to [facility] via [facility] at this time. Pt in stable condition and transport crew in possession of transfer packet.

## 2014-09-30 NOTE — Progress Notes (Signed)
Report called to [place] at [employer]. All questions answered, contact number provided.

## 2014-09-30 NOTE — Discharge Summary (Addendum)
[facility]  Physician Discharge Summary   Patient ID: [name] MRN: [id number] DOB/AGE: [birth year] 49 y.o.  Admit date: 09/10/2014 Discharge date: 09/30/2014  PCP: No PCP. To be followed by Dr. Julien Nordmann as an outpatient.  DISCHARGE DIAGNOSES:  Principal Problem:   Cancer associated pain Active Problems:   Squamous cell lung cancer   Anorexia   Metastasis   Hematemesis   Atrial fibrillation   Acute respiratory failure with hypoxia   Pneumonia   Hypoxia   Lung cancer   Palliative care encounter   Loss of appetite   Acute encephalopathy   Thrombosis of superior vena cava   Nausea without vomiting   DNR (do not resuscitate) discussion   Protein-calorie malnutrition, severe   Constipation due to opioid therapy   Weakness generalized   RECOMMENDATIONS FOR OUTPATIENT FOLLOW UP: Oxygen at 3 L/m by nasal cannula Oncology will arrange follow-up. Oncology to determine duration of Enoxaparin. CBC and basic metabolic panel on Friday.  DISCHARGE CONDITION: fair  Diet recommendation: Regular as tolerated  Filed Weights   09/28/14 0642 09/29/14 0455 09/30/14 0700  Weight: 50.8 kg (111 lb 15.9 oz) 49.941 kg (110 lb 1.6 oz) 53.797 kg (118 lb 9.6 oz)    INITIAL HISTORY: [name] is a 49 y.o. female with Lung cancer who was treated for it in [place]. She was told recently after a PET scan that she had a mass in her lung and needed further treatment but she decided to move here with her brother to help care for her. Her records were sent to Dr Julien Nordmann. She presented to the hospital with ongoing severe left chest pain and was admitted for pain control. 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 Patient also developed A. fib with RVR, due to acute illness. Seen by cardiology. Placed on Amiodarone and back to normal sinus  rhythm. Not anti-coagulation candidate. Continues to have poor respiratory status giving her pneumonia. Pulmonary service following. Patient had Port-A-Cath fluoroscopy to evaluate for malfunction, there was evidence of SVC thrombosis. Port-A-Cath was subsequently removed. She was started on IV heparin. Subsequently switched over to subcutaneous Lovenox.   Consultants: GI, pulmonary , cardiology, oncology  Procedures:  1. EGD: very severe esophagitis possibly due to previous radiation combined with reflux  2. ECHO:  Left ventricle: LVEF is grossly normal at approximately 60%.Diffiuclt to fully evaluate regional wall motion as endocardiumis difficult to see well. The cavity size was normal. Wallthickness was normal. - Pulmonary arteries: PA peak pressure: 34 mm Hg (S).   HOSPITAL COURSE:   Acute hypoxic resp failure, Resolved - Secondary to lung cancer, previous lung radiation, and COPD (no oxygen requirement though), HCAP. Slowly improving. - She was on broad-spectrum antibiotics for aspiration pneumonia. Antibiotics were de-escalate it to Levaquin and metronidazole 4/8. She has significant nausea which could be due to the metronidazole. This was discontinued as she had completed more than 7 days of treatment. Levaquin was also subsequently discontinued. - Initially on IV Lasix for volume overload, currently stopped giving no further evidence of volume overload  - CT Chest angiogram negative for PE. - Was initially requiring BiPAP at bedtime. Transitioned from Ventimask to high flow nasal cannula so she can tolerate eating and taking oral medications. High flow oxygen was subsequently titrated down to nasal cannula. She is saturating more than 95% on 3 L by nasal cannula. - Has loculated pleural effusion, discussed with  IR, unable to drain by ultrasound. Previous rounding MD also discussed with CT surgery. They don't think she would be a candidate for any intervention at this point.  Pulmonology has also discussed with the specialists and again no intervention to be done at this time.  - Repeat chest x-ray shows improvement in the infiltrates.  SVC thrombosis. - Patient had malfunctioning Port-A-Cath, fluoroscopy showing SVC thrombosis. - Her Port-A-Cath was removed. - Risks and benefits of anticoagulation were discussed with patient. Subsequently intravenous heparin was initiated after discussions with GI. Monitor closely for signs of bleeding. Hemoglobin is stable. No bleeding noted. - After discussions with the oncology patient was changed over to Lovenox from IV heparin. This will be continued even at home. Further management by oncology.  Cancer associated pain - She has been on narcotics for months for pain starting at her left lower rib cage and radiating upwards towards axilla- the pain was severe even prior to coming to [facility] per brother and patient.  - Has a lesion in left post 9th rib which is the only imaging finding that could be casing her pain - Palliative care was following for pain management. - Started on decadron to help with chest wall metastasis. This will be decreased and finally be tapered by oncology. - Worsening pain in the right flank area on 4/10. CT scan of the abdomen and pelvis revealed significant stool burden in the right colon which could be the reason for her pain. Her dose of laxative was increased. She is also on stool softeners. She had a bowel movement and her pain is much better.   HCAP (aspiration pneumonia) with loculated pleural effusion - Overall stable. At admission she may have aspirated on vomitus (hematemesis), started on HCAP coverage (used Vanc/ Levaquin and Flagyl) on 3/27 as symptoms started on her 5th day in the hospital  - Patient had deterioration of respiratory status 3/29, CT chest showing significant progression of her pneumonia, so currently on vancomycin/cefepime/Flagyl - Now clinically she is improved. She has  completed course of antibiotics.  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 Abdominal film did not suggest obstruction. Flagyl has been discontinued. Nausea is better.  Paroxysmal A-fib - Initially on Cardizem drip, then amiodarone drip, transitioned to oral amiodarone, back to normal sinus rhythm. Due to concerns for pulmonary toxicity Amiodarone discontinued. Cardiology was following. She did have a recurrence of atrial fibrillation on 4/8, but it resolved with the IV metoprolol. She is now in sinus rhythm. She was placed on oral metoprolol. - ECHO does not reveal underlying cardiac issues - [place] Vasc score of 1.  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 - CT showing left chest wall involvement - Dr Julien Nordmann saw the patient during this hospitalization. He will follow her as an outpatient. Prognosis is unclear.  - Persistent leukocytosis, likely secondary to steroids.  Hyponatremia Likely due to some degree of hypovolemia. Improved with normal saline. SIADH is also a possibility.  Anorexia-  - Ensure and Megace. Advanced diet.  Ongoing  smoking - Nicotine patch  Severe protein calorie malnutrition Due to cancer. Continue nutritional supplements.  Overall she is improved. Physical therapy recommended skilled nursing facility. However, her insurance doesn't provide this coverage.   Patient is medically stable for discharge. But she does need supervision and assistance around the clock. So if she is unable to  go to skilled nursing facility these arrangements may have to be made at home. Family is taken aback at these circumstances. They are concerned about their ability to provide around-the-clock care to the patient. So even though she is medically stable, home environment may not be safe enough for her at this time. Case Freight forwarder and Education officer, museum on board. Will continue to work to bring this issue to resolution.  These issues have been resolved. Patient will go to skilled nursing facility.  PERTINENT LABS:  The results of significant diagnostics from this hospitalization (including imaging, microbiology, ancillary and laboratory) are listed below for reference.     Labs: Basic Metabolic Panel:  Recent Labs Lab 09/24/14 0550 09/25/14 0345 09/27/14 0550 09/28/14 0443 09/29/14 0540  NA 130* 132* 127* 131* 131*  K 4.9 4.9 4.6 4.8 5.1  CL 96 98 98 101 102  CO2 29 25 22 23 22   GLUCOSE 205* 113* 103* 136* 147*  BUN 12 11 18 19 21   CREATININE 0.57 0.47* 0.51 0.56 0.56  CALCIUM 8.5 8.8 9.0 8.9 9.5   Liver Function Tests:  Recent Labs Lab 09/27/14 0550  AST 14  ALT 6  ALKPHOS 81  BILITOT 0.7  PROT 7.2  ALBUMIN 3.2*   CBC:  Recent Labs Lab 09/26/14 0425 09/27/14 0550 09/28/14 0443 09/29/14 0540 09/30/14 0400  WBC 16.7* 20.5* 19.3* 22.7* 17.3*  HGB 12.2 12.7 11.7* 12.6 12.3  HCT 37.7 38.9 36.7 38.7 38.4  MCV 85.1 85.1 85.3 85.4 86.1  PLT 472* 497* 473* 420* 395   BNP: BNP (last 3 results)  Recent Labs  09/22/14 0405  BNP 301.5*    IMAGING STUDIES Dg Chest 1 View  09/20/2014   CLINICAL DATA:  Short of breath.  Evaluate Port-A-Cath.  EXAM: CHEST  1 VIEW  COMPARISON:  One day prior  FINDINGS: A right Port-A-Cath from an internal jugular approach terminates at the high SVC. The catheter extends superiorly and is angulated in the low neck prior to extending inferiorly. Midline trachea. Mild cardiomegaly. Moderate left pleural effusion and small right pleural effusions are  similar. No pneumothorax. Left lower lobe airspace disease is persistent. Patchy interstitial and airspace opacities throughout the right lung are not significantly changed.  IMPRESSION: Right internal jugular Port-A-Cath which is angled in the neck. Consider repositioning.  No significant change in appearance of the chest. Interstitial and airspace disease throughout the right lung which is suspicious for infection. Asymmetric pulmonary edema less likely.  Left base consolidation adjacent to the left pleural fluid is similar.  Trace right pleural fluid.   Electronically Signed   By: Abigail Miyamoto M.D.   On: 09/20/2014 16:02   Ct Chest W Contrast  09/11/2014   CLINICAL DATA:  Left-sided chest pain radiating to the back. 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  EXAM: CT CHEST WITH CONTRAST  TECHNIQUE: Multidetector CT imaging of the chest was performed during intravenous contrast administration.  CONTRAST:  30mL OMNIPAQUE IOHEXOL 300 MG/ML  SOLN  COMPARISON:  Chest radiograph earlier this day. No remote exams available for comparison.  FINDINGS: Right chest port with tip in the proximal SVC. There is  volume loss in the left hemithorax with short this segment of marked luminal narrowing involving the left mainstem bronchus at the left hilum. Soft tissue density is noted adjacent to the right and left bronchi at the hilum. There is paramediastinal soft tissue density in the left upper lobe that has heterogeneous enhancement. Within the left upper lobe this a pleural-based bilobed 1.6 x 1.6 cm nodule. Reticular opacities at the left lung apex with associated apical thickening. Minimal linear opacities in the left lower lobe, with irregular opacity at the costophrenic angle. There is a small partially loculated left pleural effusion.  No right-sided tiny subpleural nodule opacities at the lung apex measuring 4 mm. There are otherwise no right pulmonary nodules. Emphysematous changes noted. There is  mild paramediastinal fibrosis in the right lung that may be radiation change.  There is a 2.3 x 1.4 cm probable necrotic lymph node in the AP window. Minimal soft tissue density in the left hilum. There is otherwise no mediastinal adenopathy. Small pericardial effusion primarily adjacent to the right atrium. No pericardial nodularity or enhancement  The heart size is normal. The thoracic aorta is normal in caliber without dissection.  Evaluation of the upper abdomen demonstrates no acute abnormality. There are no adrenal nodules. No focal hepatic lesion in the included liver.  Lobular lucent partially expansile lesion with and posterior left ninth rib concerning for metastasis. No pathologic fracture. No definite additional focal osseous lesion.  IMPRESSION: 1. Volume loss in the left hemithorax with paramediastinal soft tissue density. There is para-mediastinal reticular change. Findings suggest posttreatment related change related to lung cancer, however no prior exams are available for comparison and correlation with history is recommended. There is a bilobed 1.6 cm pleural based nodule in the left upper lobe concerning for malignancy. Probable necrotic lymph node in the AP window. 2. Lucent lesion in left posterior ninth rib concerning for bony metastasis. 3. Underlying emphysema. Tiny subpleural nodularity at the right lung apex is nonspecific, suspect scarring, again correlation with prior imaging recommended.   Electronically Signed   By: Jeb Levering M.D.   On: 09/11/2014 00:30   Ct Angio Chest Pe W/cm &/or Wo Cm  09/18/2014   CLINICAL DATA:  49 year old female with left lung cancer, chemotherapy until February. Chest pain and shortness of breath for 1 week. Initial encounter.  EXAM: CT ANGIOGRAPHY CHEST WITH CONTRAST  TECHNIQUE: Multidetector CT imaging of the chest was performed using the standard protocol during bolus administration of intravenous contrast. Multiplanar CT image reconstructions and  MIPs were obtained to evaluate the vascular anatomy.  CONTRAST:  12mL OMNIPAQUE IOHEXOL 350 MG/ML SOLN  COMPARISON:  Chest CT 09/10/2014.  FINDINGS: Good contrast bolus timing in the pulmonary arterial tree. Mass effect on the left main pulmonary artery with narrowing, but the vessel remains patent. No left side pulmonary artery branch occlusion identified. Elsewhere no filling defect identified to suggest the presence of acute pulmonary embolism.  New bilateral pleural effusions, mostly loculated and/or sub pulmonic on the left and moderate on the right. Progressed pleural based soft tissue mass in the left upper lung measuring 2 cm now (1.7 cm last week). Small pericardial effusion is new. New widespread abnormal mostly interstitial or ground-glass opacity in the right lung. Only in the superior segment of the lower lobe is spared. New abnormal confluent left lower lobe opacity along the surface of the diaphragm and also surrounding loculated appearing left lung base fluid. Stable Major airways with high-grade stenosis of the left mainstem  bronchus. Post treatment related collapse of a portion of the medial left upper lung appears stable. Underlying emphysema.  Subjacent to the abnormal fluid and lung density at the left lung base the medial superior aspect of the spleen is now abnormally enhancing (see series 4, image 72 and coronal image 84 of series 605. This is patchy and not wedge-shaped. Also somewhat remote from this area there is mild hypo enhancement at the anterior aspect of the spleen. The appearance does not resemble normal early splenic artery enhancement.  The visible liver is grossly stable. Visible adrenal glands, renal parenchyma, pancreas, and bowel in the abdomen do not appear significantly changed.  Negative visualized aorta new abnormal mediastinal soft tissue or fluid most pronounced in the sub carina region (series 4, image 43). Suggestion of associated thickening of the thoracic esophagus.  There is small volume retained hyperdense material within the esophagus.  No axillary lymphadenopathy.  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 There may also be early involvement of the posterior tenth rib on series 7, image 78. There is a left lateral destructive soft tissue mass at the left eighth rib which has not significantly changed (series 7, image 75). Right chest porta cath remains in place. No destructive lesion identified in the spine.  Review of the MIP images confirms the above findings.  IMPRESSION: 1. No evidence of acute pulmonary embolus. Tumor versus post treatment fibrosis narrowing the left pulmonary arteries. 2. New severe abnormal pulmonary opacity in both lungs since 09/10/2014 most resembles bilateral pneumonia. Widespread endobronchial spread of tumor also is possible. 3. New bilateral pleural effusions, loculated on the left and associated with pleural and chest wall tumor involvement. Subjacent abnormal enhancement in the spleen is suspicious and suspected to reflect infiltration of either tumor or infection through the diaphragm. 4. Multiple destructive left rib metastases, with pleural and chest wall involvement.   Electronically Signed   By: Genevie Ann M.D.   On: 09/18/2014 12:12   Ct Abdomen Pelvis W Contrast  09/28/2014   CLINICAL DATA:  Increasing right flank and abdominal pain. Patient with known lung cancer partially treated in [place]. Hematemesis. Recent diagnosis of radiation esophagitis, acute hypoxic respiratory failure and cancer associated pain.  EXAM: CT ABDOMEN AND PELVIS WITH CONTRAST  TECHNIQUE: Multidetector CT imaging of the abdomen and pelvis was performed using the standard protocol following bolus administration of intravenous contrast.  CONTRAST:  173mL OMNIPAQUE IOHEXOL 300 MG/ML  SOLN  COMPARISON:  Recent chest CTs.  FINDINGS: Lower chest: There has been significant decrease in  bilateral lower lung opacities. Scarring/ interstitial prominence within both lower lungs again noted. Nodular opacities/ consolidation in the right lower lobe lung base may represent atelectasis, infection or metastatic disease. A pericardial effusion is unchanged.  Hepatobiliary: The liver and gallbladder are unremarkable. There is no evidence of biliary dilatation.  Pancreas: Unremarkable  Spleen: Unremarkable  Adrenals/Urinary Tract: The kidneys, adrenal glands scratch the the kidneys and adrenal glands are unremarkable. A Foley catheter is noted within a distended bladder.  Stomach/Bowel: A large amount of stool within the colon, greatest in the right colon. There is no evidence of bowel obstruction or suspicious wall thickening. The appendix is unremarkable.  Vascular/Lymphatic: No enlarged lymph nodes or abdominal aortic aneurysm.  Reproductive: Uterine fibroids are identified. No adnexal masses are noted.  Other: No free fluid, abscess or pneumoperitoneum.  Musculoskeletal: Metastases are identified as follows:  A 1.8 x 2.5  cm a metastasis between the left seventh and eighth ribs with rib involvement (image 12)  A 1.4 x 2.5 cm medial left pleural/chest wall metastasis (image 15)  A 1 x 2.4 cm posterior left pleural/ chest wall metastasis (image 18)  A 1.5 x 4 cm posterior left ninth rib soft tissue metastasis.  No acute bony abnormalities are identified. Degenerative discs scratch a moderate degenerative disc disease and chronic diffuse broad-based disc bulge at L5-S1 noted.  IMPRESSION: Left chest wall/pleural metastatic disease as described.  Large amount of colonic stool, greatest in the right colon. This could contribute to the patient's feeling of right abdominal discomfort.  Significantly improved aeration/opacities within both lower lungs. Persistent nodular opacities within the new right lower lobe which may represent metastatic disease, infection or atelectasis.  Foley catheter within a distended  bladder.   Electronically Signed   By: Margarette Canada M.D.   On: 09/28/2014 16:07   Ir Removal Tun Access W/ Port W/o Fl Mod Sed  09/23/2014   CLINICAL DATA:  Malfunctioning right IJ power port catheter with evidence of SVC thrombosis by contrast injection.  EXAM: RIGHT IJ TUNNEL POWER PORT CATHETER REMOVAL  Date:  4/5/20164/10/2014 3:25 pm  Radiologist:  M. Daryll Brod, MD  MEDICATIONS AND MEDICAL HISTORY: 1% lidocaine locally  COMPLICATIONS: None immediate  PROCEDURE: Informed consent was obtained from the patient following explanation of the procedure, risks, benefits and alternatives. The patient understands, agrees and consents for the procedure. All questions were addressed. A time out was performed.  Maximal barrier sterile technique utilized including caps, mask, sterile gowns, sterile gloves, large sterile drape, hand hygiene, and ChloraPrep.  Under sterile conditions and local anesthesia, an incision was made along the existing port catheter scar. Blunt and sharp dissection performed to remove the catheter. Hemostasis obtained. Incision closed in a two-layer fashion with interrupted and running Vicryl suture. No immediate complication. Sterile dressing applied. The patient tolerated the procedure well.  IMPRESSION: Successful right IJ power port catheter removal.   Electronically Signed   By: Jerilynn Mages.  Shick M.D.   On: 09/23/2014 15:42   Ir Cv Line Injection  09/23/2014   CLINICAL DATA:  Malfunctioning right IJ power port catheter  EXAM: FLUOROSCOPIC INJECTION OF THE EXISTING RIGHT IJ POWER PORT  Date:  4/5/20164/10/2014 9:55 am  Radiologist:  M. Daryll Brod, MD  Guidance:  FLUOROSCOPIC  FLUOROSCOPY TIME:  6 SECONDS, 18 mGY  MEDICATIONS AND MEDICAL HISTORY: NONE.  ANESTHESIA/SEDATION: NONE  CONTRAST:  10 CC OMNIPAQUE 604  COMPLICATIONS: None immediate  PROCEDURE: Informed consent was obtained from the patient following explanation of the procedure, risks, benefits and alternatives. The patient understands,  agrees and consents for the procedure. All questions were addressed. A time out was performed.  Under sterile conditions, the existing accessed right IJ power port catheter was injected under fluoroscopy with contrast. Subtraction imaging performed.  Right IJ port catheter tubing has a high course into the right neck, suspect related to high access of the jugular vein during placement. Port catheter tubing terminates in the proximal SVC just below the innominate vein confluence. Upon injection, nodular filling defects are present in SVC with sluggish flow compatible with SVC thrombosis, appearing nearly occlusive. No significant fibrin sheath about the catheter tip. Contrast does flow into the lower SVC and right atrium.  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  These results were called by telephone at the time of interpretation on 09/23/2014 at 10:28 am to Dr. Emeline Gins  [facility] , who verbally acknowledged these results.   Electronically Signed   By: Jerilynn Mages.  Shick M.D.   On: 09/23/2014 11:29   Ir US Guide Vasc Access Right  09/23/2014   CLINICAL DATA:  Malfunctioning port catheter with SVC thrombosis. Port catheter removed. Limited access.  EXAM: RIGHT UPPER EXTREMITY DOUBLE-LUMEN PERIPHERAL PICC LINE PLACEMENT WITH ULTRASOUND AND FLUOROSCOPIC GUIDANCE  FLUOROSCOPY TIME:  NONE.  PROCEDURE: The patient was advised of the possible risks andcomplications and agreed to undergo the procedure. The patient was then brought to the angiographic suite for the procedure.  The RIGHT arm was prepped with chlorhexidine, drapedin the usual sterile fashion using maximum barrier technique (cap and mask, sterile gown, sterile gloves, large sterile sheet, hand hygiene and cutaneous antisepsis) and infiltrated locally with 1% Lidocaine.  Ultrasound demonstrated patency of the RIGHT BASILIC vein, and this was documented with an image. Under real-time ultrasound guidance, this vein was accessed  with a 21 gauge micropuncture needle and image documentation was performed. A 0.018 wire was introduced in to the vein. Over this, a 5 [place] DOUBLE lumen POWER PICC was advanced INTO THE RIGHT BASILIC VEIN PERIPHERALLY. THIS SHOULD BE USED AS PERIPHERAL ACCESS ONLY. The catheter was flushed and covered with asterile dressing.  Catheter length: 9 CM  Complications: NONE IMMEDIATE  IMPRESSION: Successful right upper extremitydouble-lumen intentionally short power PICC line placement with ultrasound guidance. Catheter is only 9 cm in length with the tip in the right basilic vein peripherally. This should be used as peripheral access only. The catheter is ready for use.   Electronically Signed   By: Jerilynn Mages.  Shick M.D.   On: 09/23/2014 15:49   Dg Chest Port 1 View  09/29/2014   CLINICAL DATA:  Dyspnea.  Lung cancer.  EXAM: PORTABLE CHEST - 1 VIEW  COMPARISON:  Two-view chest x-ray 09/24/2014  FINDINGS: The heart size is normal. Postsurgical changes of the left lung are evident. The left pleural effusion is slightly improved. Aeration in the right lung is improved as well. There is persistent interstitial or airspace disease opacity at the right base.  IMPRESSION: 1. Improved aeration right lung with persistent interstitial or airspace disease at the right base. 2. Postsurgical changes of the left lung with improving effusion.   Electronically Signed   By: San Morelle M.D.   On: 09/29/2014 13:23   Dg Chest Port 1 View  09/24/2014   CLINICAL DATA:  Pulmonary infiltrate.  EXAM: PORTABLE CHEST - 1 VIEW  COMPARISON:  09/23/2014.  09/22/2014 .  FINDINGS: Interim removal power Port catheter. Mediastinum and hilar structures are normal. Diffuse right lung infiltrate. Left lung base atelectasis and/or infiltrate with persistent left pleural effusion. No pneumothorax. Stable heart size.  IMPRESSION: 1. Interim removal of PowerPort catheter. 2. Persistent diffuse right lung infiltrate. Persistent atelectasis and or  infiltrate left lung base with persistent left pleural effusion. No significant interim change.   Electronically Signed   By: Marcello Moores  Register   On: 09/24/2014 07:17   Dg Chest Port 1 View  09/23/2014   CLINICAL DATA:  Pulmonary infiltrates.  EXAM: PORTABLE CHEST - 1 VIEW  COMPARISON:  09/22/2014.  FINDINGS: Power port catheter noted with tip in the superior vena cava. Mediastinum and hilar structures are normal. Heart size stable. Persistent diffuse right lung infiltrate, no change. Small right pleural effusion cannot be excluded Persistent atelectasis left lower lobe with small left pleural effusion. No pneumothorax.  IMPRESSION: 1. Persistent diffuse right lung infiltrate. Small right pleural effusion  cannot be excluded.  2. Persistent atelectasis of the left lower lobe with left pleural effusion. No change.   Electronically Signed   By: Marcello Moores  Register   On: 09/23/2014 07:18   Dg Chest Port 1 View  09/22/2014   CLINICAL DATA:  Followup pleural effusion and pulmonary infiltrates. Left lung carcinoma.  EXAM: PORTABLE CHEST - 1 VIEW  COMPARISON:  09/20/2014  FINDINGS: Right-sided power port remains in stable position. A moderate left pleural effusion and left lower lung consolidation or collapse shows no significant change.  Diffuse asymmetric right lung interstitial infiltrates also appears stable, with tiny right pleural effusion which is unchanged. Differential diagnosis includes asymmetric edema, atypical infection, and lymphangitic spread of carcinoma.  IMPRESSION: No significant change compared with prior exam.   Electronically Signed   By: Earle Gell M.D.   On: 09/22/2014 12:33   Dg Chest Port 1 View  09/19/2014   CLINICAL DATA:  Respiratory failure.  EXAM: PORTABLE CHEST - 1 VIEW  COMPARISON:  Chest CT 12 hr prior  FINDINGS: Tip of the right chest port remains in the proximal SVC. Unchanged elevation of left hemidiaphragm with left pleural effusion. Diffuse parenchymal opacity throughout the right  lung, mildly worsened in the right upper lobes. Persistent blunting of right costophrenic angle. Cardiomediastinal contours are unchanged.  IMPRESSION: 1. Diffuse parenchymal opacity throughout the right lung, mild worsening in the right upper lobe. 2. Unchanged appearance of the left hemithorax.   Electronically Signed   By: Jeb Levering M.D.   On: 09/19/2014 00:44   Dg Chest Port 1 View  09/18/2014   CLINICAL DATA:  Respiratory failure, malignancy with history of left lung cancer July 2015 having undergone chemotherapy and radiation therapy completed February 2016  EXAM: PORTABLE CHEST - 1 VIEW  COMPARISON:  09/17/2014  FINDINGS: Moderate left pleural effusions stable. Volume loss left thorax with shift of mediastinum for the left, stable. Heavy interstitial change throughout the right lung stable, with milder interstitial change on the left. Abnormal opacity retrocardiac left lower lobe stable. No change in position of Port-A-Cath. Stable mild right costophrenic angle blunting.  IMPRESSION: No significant change from prior study. Persistent left effusion and left lower lobe consolidation as well as interstitial prominence suggesting pulmonary edema.   Electronically Signed   By: Skipper Cliche M.D.   On: 09/18/2014 10:15   Dg Chest Port 1 View  09/18/2014   CLINICAL DATA:  Respiratory distress. Hypoxia. History of lung cancer.  EXAM: PORTABLE CHEST - 1 VIEW  COMPARISON:  Most recent radiograph earlier this day at 0122 hr, Chest CT 323 16  FINDINGS: Volume loss in the left hemithorax, however mildly improved aeration from prior exam. Left basilar consolidation and pleural effusion, mildly improved. Slight decrease in cardiomegaly. Interstitial edema, similar to prior. Multifocal parenchymal opacities in the periphery of the right lung, improved in the mid lung zone. Right chest port remains in place. Unchanged blunting of the right costophrenic angle. There is no pneumothorax.  IMPRESSION: 1. Improved  radiographic appearance of the chest with slight improvement in left basilar and right mid lung zone aeration. 2. Pulmonary edema and multifocal opacities in the periphery of the right lung, unchanged.   Electronically Signed   By: Jeb Levering M.D.   On: 09/18/2014 00:14   Dg Chest Port 1 View  09/17/2014   CLINICAL DATA:  Sudden onset of shortness of breath. Initial encounter.  EXAM: PORTABLE CHEST - 1 VIEW  COMPARISON:  Chest radiograph performed 09/14/2014  FINDINGS: Worsening left basilar airspace opacification is compatible with pneumonia. Underlying interstitial prominence is again noted, likely reflecting mild pulmonary edema. Mild focal peripheral right mid and lower lung zone airspace opacities are also seen, concerning for pneumonia. Small bilateral pleural effusions are again seen. There is chronic elevation of the left hemidiaphragm. No pneumothorax is identified.  The cardiomediastinal silhouette is enlarged. A right-sided chest port is noted ending overlying the mid SVC. No acute osseous abnormalities are identified.  IMPRESSION: 1. Worsening left basilar airspace opacification, and mild focal right peripheral mid and lower lung zone airspace opacities, compatible with multifocal pneumonia, new from the prior study. 2. Underlying interstitial prominence likely reflects mild pulmonary edema. Small bilateral pleural effusions and cardiomegaly again noted.   Electronically Signed   By: Garald Balding M.D.   On: 09/17/2014 01:36   Dg Chest Port 1 View  09/14/2014   CLINICAL DATA:  Cough, left lung cancer  EXAM: PORTABLE CHEST - 1 VIEW  COMPARISON:  CT 09/10/2014, chest radiograph 09/10/2014.  FINDINGS: Right-sided Port-A-Cath in place with tip over the upper SVC. Left hemithoracic volume loss and postsurgical change reidentified. Interval development of ill-defined diffuse reticular interstitial pulmonary opacities. No new focal airspace disease.  IMPRESSION: Development of interstitial  reticular opacities likely indicating interstitial edema.   Electronically Signed   By: Conchita Paris M.D.   On: 09/14/2014 09:52   Dg Chest Port 1 View  09/10/2014   CLINICAL DATA:  Left-sided chest pain.  Lung carcinoma  EXAM: PORTABLE CHEST - 1 VIEW  COMPARISON:  None.  FINDINGS: There is elevation of the left hemidiaphragm. There is a small left pleural effusion with atelectatic change in left base. There is ill-defined opacity in the left upper lobe, probably representing pneumonia, although mass in this area is a differential consideration. Right lung is clear. Heart size is normal. Pulmonary vascularity is within normal limits. No adenopathy. Port-A-Cath tip is in the superior vena cava. No pneumothorax evident.  IMPRESSION: Opacity left upper lobe. Probable pneumonia, although mass in this area is a differential consideration. Small left effusion. Volume loss on left. Right lung clear.   Electronically Signed   By: Lowella Grip III M.D.   On: 09/10/2014 19:35   Dg Abd Portable 2v  09/26/2014   CLINICAL DATA:  One-day history of nausea and vomiting ; history of lung malignancy  EXAM: PORTABLE ABDOMEN - 2 VIEW  COMPARISON:  None.  FINDINGS: There is a moderate volume of gas within the descending colon. The colon does not appear abnormally distended. Elsewhere the colon is unremarkable. There is no small bowel obstructive pattern. No free extraluminal gas collections are demonstrated. There is mild degenerative change of the lower lumbar spine.  IMPRESSION: Nonspecific bowel gas pattern. There is no evidence of obstruction or ileus. If the patient's symptoms warrant further evaluation, abdominal and pelvic CT scanning would be the most useful next imaging modality.   Electronically Signed   By: David  [place]ique   On: 09/26/2014 16:12   Ir Fluoro Guide Cv Midline Picc Right  09/23/2014   CLINICAL DATA:  Malfunctioning port catheter with SVC thrombosis. Port catheter removed. Limited access.  EXAM:  RIGHT UPPER EXTREMITY DOUBLE-LUMEN PERIPHERAL PICC LINE PLACEMENT WITH ULTRASOUND AND FLUOROSCOPIC GUIDANCE  FLUOROSCOPY TIME:  NONE.  PROCEDURE: The patient was advised of the possible risks andcomplications and agreed to undergo the procedure. The patient was then brought to the angiographic suite for the procedure.  The RIGHT arm was prepped with chlorhexidine,  drapedin the usual sterile fashion using maximum barrier technique (cap and mask, sterile gown, sterile gloves, large sterile sheet, hand hygiene and cutaneous antisepsis) and infiltrated locally with 1% Lidocaine.  Ultrasound demonstrated patency of the RIGHT BASILIC vein, and this was documented with an image. Under real-time ultrasound guidance, this vein was accessed with a 21 gauge micropuncture needle and image documentation was performed. A 0.018 wire was introduced in to the vein. Over this, a 5 [place] DOUBLE lumen POWER PICC was advanced INTO THE RIGHT BASILIC VEIN PERIPHERALLY. THIS SHOULD BE USED AS PERIPHERAL ACCESS ONLY. The catheter was flushed and covered with asterile dressing.  Catheter length: 9 CM  Complications: NONE IMMEDIATE  IMPRESSION: Successful right upper extremitydouble-lumen intentionally short power PICC line placement with ultrasound guidance. Catheter is only 9 cm in length with the tip in the right basilic vein peripherally. This should be used as peripheral access only. The catheter is ready for use.   Electronically Signed   By: Jerilynn Mages.  Shick M.D.   On: 09/23/2014 15:49    DISCHARGE EXAMINATION: Filed Vitals:   09/30/14 0700 09/30/14 0900 09/30/14 0917 09/30/14 1142  BP:   99/69   Pulse:   70   Temp:      TempSrc:      Resp:      Height:      Weight: 53.797 kg (118 lb 9.6 oz)     SpO2:  97% 99% 98%   General appearance: alert, cooperative, appears stated age and no distress Resp: Decreased air entry at the bases. But much improved aeration compared to previous. Cardio: regular rate and rhythm, S1, S2 normal,  no murmur, click, rub or gallop GI: soft, non-tender; bowel sounds normal; no masses,  no organomegaly Extremities: extremities normal, atraumatic, no cyanosis or edema  DISPOSITION: skilled nursing facility placement.   ALLERGIES:  Allergies  Allergen Reactions  . Penicillins Rash     Current Discharge Medication List    START taking these medications   Details  acetaminophen (TYLENOL) 325 MG tablet Take 2 tablets (650 mg total) by mouth every 6 (six) hours as needed for mild pain (or Fever >/= 101).    acidophilus (RISAQUAD) CAPS capsule Take 1 capsule by mouth daily. Qty: 30 capsule, Refills: 0    albuterol (PROVENTIL) (2.5 MG/3ML) 0.083% nebulizer solution 2.5 mg nebulized 3 times a day and every 4hrs as needed for wheezing or shortness of breath. Qty: 75 mL, Refills: 12    alum & mag hydroxide-simeth (MAALOX/MYLANTA) 200-200-20 MG/5ML suspension Take 30 mLs by mouth every 4 (four) hours as needed for indigestion or heartburn. Qty: 355 mL, Refills: 0    bisacodyl (DULCOLAX) 10 MG suppository Place 1 suppository (10 mg total) rectally as needed for moderate constipation. Qty: 12 suppository, Refills: 0    dexamethasone (DECADRON) 4 MG tablet Take 1 tablet (4 mg total) by mouth 2 (two) times daily. Qty: 60 tablet, Refills: 0    docusate sodium (COLACE) 100 MG capsule Take 1 capsule (100 mg total) by mouth 2 (two) times daily. Qty: 60 capsule, Refills: 0    enoxaparin (LOVENOX) 80 MG/0.8ML injection Inject 0.8 mLs (80 mg total) into the skin daily. Qty: 24 mL, Refills: 2    feeding supplement, RESOURCE BREEZE, (RESOURCE BREEZE) LIQD Take 1 Container by mouth 3 (three) times daily between meals. Qty: 90 Container, Refills: 2    fentaNYL (DURAGESIC - DOSED MCG/HR) 100 MCG/HR Place 1 patch (100 mcg total) onto the skin every 3 (three)  days. Qty: 10 patch, Refills: 0    guaiFENesin-dextromethorphan (ROBITUSSIN DM) 100-10 MG/5ML syrup Take 5 mLs by mouth every 4 (four) hours  as needed for cough. Qty: 118 mL, Refills: 0    ipratropium (ATROVENT) 0.02 % nebulizer solution Take 2.5 mLs (0.5 mg total) by nebulization 3 (three) times daily. Qty: 75 mL, Refills: 12    LORazepam (ATIVAN) 1 MG tablet Take 1 tablet (1 mg total) by mouth every 6 (six) hours as needed for anxiety. Qty: 30 tablet, Refills: 0    metoprolol tartrate (LOPRESSOR) 25 MG tablet Take 1 tablet (25 mg total) by mouth 2 (two) times daily. Qty: 60 tablet, Refills: 0    morphine (MS CONTIN) 30 MG 12 hr tablet Take 1 tablet (30 mg total) by mouth every 8 (eight) hours. Qty: 90 tablet, Refills: 0    morphine (MSIR) 15 MG tablet Take 1 tablet (15 mg total) by mouth every 4 (four) hours as needed for severe pain. Qty: 30 tablet, Refills: 0    nicotine (NICODERM CQ - DOSED IN MG/24 HOURS) 14 mg/24hr patch Place 1 patch (14 mg total) onto the skin daily. Qty: 28 patch, Refills: 0    omeprazole (PRILOSEC) 20 MG capsule Take 1 capsule (20 mg total) by mouth 2 (two) times daily before a meal. Qty: 60 capsule, Refills: 1    ondansetron (ZOFRAN) 4 MG tablet Take 1 tablet (4 mg total) by mouth every 8 (eight) hours as needed for nausea or vomiting. Qty: 20 tablet, Refills: 0    polyethylene glycol (MIRALAX / GLYCOLAX) packet Take 17 g by mouth 3 (three) times daily. May decrease to twice daily if you have frequent bowel movements. Qty: 120 each, Refills: 2    senna-docusate (SENOKOT-S) 8.6-50 MG per tablet Take 1 tablet by mouth at bedtime. Qty: 30 tablet, Refills: 2      STOP taking these medications     fentaNYL (DURAGESIC - DOSED MCG/HR) 75 MCG/HR      oxycodone (ROXICODONE) 30 MG immediate release tablet        Follow-up Information    Follow up with Eilleen Kempf., MD.   Specialty:  Oncology   Why:  his office will call with appointment   Contact information:   [place] [address] [place]: 35 mins  [facility] Pager [phone]  09/30/2014, 12:17 PM

## 2014-09-30 NOTE — Progress Notes (Signed)
ANTICOAGULATION CONSULT NOTE - Follow Up Consult  Pharmacy Consult for Lovenox Indication: SVC thrombus  Allergies  Allergen Reactions  . Penicillins Rash    Patient Measurements: Height: 5\' 7"  (170.2 cm) Weight: 118 lb 9.6 oz (53.797 kg) IBW/kg (Calculated) : 61.6  Vital Signs: Temp: 97.7 F (36.5 C) (04/12 0517) Temp Source: Oral (04/12 0517) BP: 99/69 mmHg (04/12 0917) Pulse Rate: 70 (04/12 0917)  Labs:  Recent Labs  09/28/14 0443 09/29/14 0540 09/30/14 0400  HGB 11.7* 12.6 12.3  HCT 36.7 38.7 38.4  PLT 473* 420* 395  CREATININE 0.56 0.56  --     Estimated Creatinine Clearance: 73.8 mL/min (by C-G formula based on Cr of 0.56).   Medications:  Scheduled:  . acidophilus  1 capsule Oral Daily  . antiseptic oral rinse  7 mL Mouth Rinse BID  . antiseptic oral rinse  7 mL Mouth Rinse q12n4p  . chlorhexidine  15 mL Mouth Rinse BID  . dexamethasone  8 mg Oral 3 times per day  . dextromethorphan-guaiFENesin  1 tablet Oral BID  . docusate sodium  100 mg Oral BID  . enoxaparin (LOVENOX) injection  75 mg Subcutaneous Q24H  . feeding supplement (RESOURCE BREEZE)  1 Container Oral TID BM  . fentaNYL  100 mcg Transdermal Q72H  . ipratropium  0.5 mg Nebulization QID  . levalbuterol  0.63 mg Nebulization QID  . metoprolol tartrate  25 mg Oral BID  . morphine  30 mg Oral 3 times per day  . nicotine  14 mg Transdermal Daily  . ondansetron  4 mg Oral Q6H   Or  . ondansetron (ZOFRAN) IV  4 mg Intravenous Q6H  . pantoprazole  40 mg Oral BID  . polyethylene glycol  17 g Oral TID  . senna-docusate  1 tablet Oral QHS  . sodium chloride  3 mL Intravenous Q12H   Infusions:    Assessment: 48YOF admitted on 3/24 for pain control due to left-sided chest pain related to metastatic lung cancer.  She developed an upper GI bleed this admission. Porta-cath evaluated via fluoroscopy and showed + SVC thrombosis on 4/5. Porta-cath was removed and pharmacy was initially consulted to  dose IV heparin.  Pharmacy was consulted to change to Lovenox on 4/8 per Oncology recommendation for long-term use at discharge.  Today, 09/30/2014:  CBC: Hgb remains WNL, Plt elevated  SCr stable  No bleeding/complications reported  Weight changes: admission weight 58 kg, increased to max of 66kg on 4/3, decreased to 49.9 kg on 4/11.  Back to 53.8 kg today, 4/12.  Note: patient refused her Lovenox dose on 4/10.   Goal of Therapy:  Anti-Xa level 0.6-1 units/ml 4hrs after LMWH dose given Monitor platelets by anticoagulation protocol: Yes   Plan:   Lovenox 80 mg SQ once daily.  Dose remains 1.5 mg/kg/day; dose change due to change in weight.  Follow up renal function, CBC, and weight changes.   Gretta Arab PharmD, BCPS Pager [phone] 09/30/2014 10:27 AM

## 2014-09-30 NOTE — Discharge Instructions (Signed)
Lung Cancer Lung cancer is an abnormal growth of cells in one or both of your lungs. These extra cells may form a mass of tissue called a growth or tumor. Tumors can be either cancerous (malignant) or not cancerous (benign).  Lung cancer is the most common cause of cancer death in men and women. There are several different types of lung cancers. Usually, lung cancer is described as either small cell lung cancer or nonsmall cell lung cancer. Other types of cancer occur in the lungs, including carcinoid and cancers spread from other organs. The types of cancer have different behavior and treatment. RISK FACTORS Smoking is the most common risk factor for developing lung cancer. Other risk factors include:  Radon gas exposure.  Asbestos and other industrial substance exposure.  Second hand tobacco smoke.  Air pollution.  Family or personal history of lung cancer.  Age older than 67 years. CAUSES  Lung cancer usually starts when the lungs are exposed to harmful chemicals. Smoking is the most common risk factor for lung cancer. When you quit smoking, your risk of lung cancer falls each year (but is never the same as a person who has never smoked).  SYMPTOMS  Lung cancer may not have any symptoms in its early stages. The symptoms can depend on the type of cancer, its location, and other factors. Symptoms can include:  Cough (either new, different, or more severe).  Shortness of breath.  Coughing up blood (hemoptysis).  Chest pain.  Hoarseness.  Swelling of the face.  Drooping eyelid.  Changes in blood tests, such as low sodium (hyponatremia), high calcium (hypercalcemia), or low blood count (anemia).  Weight loss. DIAGNOSIS  Your health care provider may suspect lung cancer based on your symptoms or based on tests obtained for other reasons. Tests or procedures used to find or confirm the presence of lung cancer may include:  Chest X-ray.  CT scan of the lungs and chest.  Blood  tests.  Taking a tissue sample (biopsy) from your lung to look for cancer cells. Your cancer will be staged to determine its severity and extent. Staging is a careful attempt to find out the size of the tumor, whether the cancer has spread, and if so, to what parts of the body. You may need to have more tests to determine the stage of your cancer. The test results will help determine what treatment plan is best for you.   Stage 0--This is the earliest stage of lung cancer. In this stage the tumor is present in only a few layers of cells and has not grown beyond the inner lining of the lungs. Stage 0 (carcinoma in situ) is considered noninvasive, meaning at this stage it is not yet capable of spreading to other regions.  Stage I-- The cancer is located only in the lungs and not spread to any lymph nodes.  Stage II--The cancer is in the lungs and the nearby lymph nodes.  Stage III--The cancer is in the lungs and the lymph nodes in the middle of the chest. This is also called locally advanced disease. This stage has two subtypes:  Stage IIIa - The cancer has spread only to lymph nodes on the same side of the chest where the cancer started.  Stage IIIb - The cancer has spread to lymph nodes on the opposite side of the chest or above the collar bone.  Stage IV-- This is the most advanced stage of lung cancer and is also called advanced disease. This  stage describes when the cancer has spread to both lungs, the fluid in the area around the lungs, or to another body part. Your health care provider may tell you the detailed stage of your cancer, which includes both a number and a letter.  TREATMENT  Depending on the type and stage, lung cancer may be treated with surgery, radiation therapy, chemotherapy, or targeted therapy. Some people have a combination of these therapies. Your treatment plan will be developed by your health care team.  [place] not smoke.  Only take  over-the-counter or prescription medicines for pain, discomfort, or fever as directed by your health care provider.  Maintain a healthy diet.  Consider joining a support group. This may help you learn to cope with the stress of having lung cancer.  Seek advice to help you manage treatment side effects.  Keep all follow-up appointments as directed by your health care provider.  Inform your cancer specialist if you are admitted to the hospital. [place] IF:   You are losing weight without trying.  You have a persistent cough.  You feel short of breath.  You tire easily. SEEK IMMEDIATE MEDICAL CARE IF:   You cough up clotted blood or bright red blood.  Your pain is not manageable or controlled by medicine.  You develop new difficulty breathing or chest pain.  You develop swelling in one or both ankles or legs, or swelling in your face or neck.  You develop headache or confusion. Document Released: 09/12/2000 Document Revised: 03/27/2013 Document Reviewed: 10/10/2013 [facility] Patient Information 2015 [place], [place]. This information is not intended to replace advice given to you by your health care provider. Make sure you discuss any questions you have with your health care provider.

## 2014-09-30 NOTE — Progress Notes (Signed)
CSW & RNCM, Juliann Pulse spoke with patient, brother & sister-in-law at bedside re: discharge plans. Son does not feel he could take care of her at home, though CSW explained that patient does not have out of network benefits through her insurance. Patient's sister-in-law currently on the phone with her insurance, [employer] to see if they would be willing to work out a deal for them to approve a short-term stay at [facility]. If insurance is not willing, [place] confirmed that patient does have 100% coverage for home health care & DME.   CSW will await call from patient's sister-in-law with outcome of [employer] conversation.      Raynaldo Opitz, [facility] Clinical Social Worker cell #: [phone]

## 2014-10-02 ENCOUNTER — Encounter: Payer: Self-pay | Admitting: *Deleted

## 2014-10-03 ENCOUNTER — Telehealth: Payer: Self-pay | Admitting: Internal Medicine

## 2014-10-03 NOTE — Telephone Encounter (Signed)
lvm for pt regarding to 4.29 appt....mailed appt sched/and letter

## 2014-10-16 ENCOUNTER — Other Ambulatory Visit: Payer: Self-pay | Admitting: Medical Oncology

## 2014-10-16 DIAGNOSIS — C3490 Malignant neoplasm of unspecified part of unspecified bronchus or lung: Secondary | ICD-10-CM

## 2014-10-17 ENCOUNTER — Ambulatory Visit (HOSPITAL_BASED_OUTPATIENT_CLINIC_OR_DEPARTMENT_OTHER): Payer: 59 | Admitting: Internal Medicine

## 2014-10-17 ENCOUNTER — Encounter: Payer: Self-pay | Admitting: Internal Medicine

## 2014-10-17 ENCOUNTER — Telehealth: Payer: Self-pay | Admitting: Internal Medicine

## 2014-10-17 ENCOUNTER — Other Ambulatory Visit (HOSPITAL_BASED_OUTPATIENT_CLINIC_OR_DEPARTMENT_OTHER): Payer: 59

## 2014-10-17 VITALS — BP 91/67 | HR 72 | Temp 98.7°F | Resp 18 | Ht 67.0 in | Wt 117.4 lb

## 2014-10-17 DIAGNOSIS — I82210 Acute embolism and thrombosis of superior vena cava: Secondary | ICD-10-CM

## 2014-10-17 DIAGNOSIS — G893 Neoplasm related pain (acute) (chronic): Secondary | ICD-10-CM | POA: Diagnosis not present

## 2014-10-17 DIAGNOSIS — C3492 Malignant neoplasm of unspecified part of left bronchus or lung: Secondary | ICD-10-CM | POA: Diagnosis not present

## 2014-10-17 DIAGNOSIS — C3490 Malignant neoplasm of unspecified part of unspecified bronchus or lung: Secondary | ICD-10-CM

## 2014-10-17 LAB — CBC WITH DIFFERENTIAL/PLATELET
BASO%: 0.1 % (ref 0.0–2.0)
Basophils Absolute: 0 10*3/uL (ref 0.0–0.1)
EOS%: 0.6 % (ref 0.0–7.0)
Eosinophils Absolute: 0.1 10*3/uL (ref 0.0–0.5)
HEMATOCRIT: 36.6 % (ref 34.8–46.6)
HGB: 11.9 g/dL (ref 11.6–15.9)
LYMPH%: 4.7 % — ABNORMAL LOW (ref 14.0–49.7)
MCH: 28.2 pg (ref 25.1–34.0)
MCHC: 32.5 g/dL (ref 31.5–36.0)
MCV: 86.7 fL (ref 79.5–101.0)
MONO#: 0.9 10*3/uL (ref 0.1–0.9)
MONO%: 4.3 % (ref 0.0–14.0)
NEUT#: 18.3 10*3/uL — ABNORMAL HIGH (ref 1.5–6.5)
NEUT%: 90.3 % — AB (ref 38.4–76.8)
Platelets: 362 10*3/uL (ref 145–400)
RBC: 4.22 10*6/uL (ref 3.70–5.45)
RDW: 17.3 % — ABNORMAL HIGH (ref 11.2–14.5)
WBC: 20.2 10*3/uL — ABNORMAL HIGH (ref 3.9–10.3)
lymph#: 0.9 10*3/uL (ref 0.9–3.3)

## 2014-10-17 LAB — COMPREHENSIVE METABOLIC PANEL (CC13)
ALT: 11 U/L (ref 0–55)
AST: 11 U/L (ref 5–34)
Albumin: 2.8 g/dL — ABNORMAL LOW (ref 3.5–5.0)
Alkaline Phosphatase: 79 U/L (ref 40–150)
Anion Gap: 9 mEq/L (ref 3–11)
BILIRUBIN TOTAL: 0.25 mg/dL (ref 0.20–1.20)
BUN: 10.2 mg/dL (ref 7.0–26.0)
CO2: 21 meq/L — AB (ref 22–29)
Calcium: 8.6 mg/dL (ref 8.4–10.4)
Chloride: 100 mEq/L (ref 98–109)
Creatinine: 0.6 mg/dL (ref 0.6–1.1)
EGFR: >90 mL/min/{1.73_m2} (ref >90)
GLUCOSE: 119 mg/dL (ref 70–140)
Potassium: 3.9 mEq/L (ref 3.5–5.1)
Sodium: 131 mEq/L — ABNORMAL LOW (ref 136–145)
Total Protein: 6.1 g/dL — ABNORMAL LOW (ref 6.4–8.3)

## 2014-10-17 LAB — TECHNOLOGIST REVIEW

## 2014-10-17 NOTE — Progress Notes (Signed)
[place] [phone]   [phone]  OFFICE PROGRESS NOTE  No primary care provider on file. No primary provider on file.  DIAGNOSIS:  1) Metastatic non-small cell lung cancer, adenocarcinoma initially diagnosed as a stage IIIB diagnosed in March 2015. 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  PRIOR THERAPY: Status post course of concurrent chemoradiation with weekly carboplatin and paclitaxel but the patient did not receive any consolidation chemotherapy.  CURRENT THERAPY: None.  INTERVAL HISTORY: [name] 49 y.o. female returns to the clinic today for follow-up visit accompanied by her brother. The patient was recently admitted to [facility] with significant dyspnea and respiratory failure. She was found to have progression of her disease as well as postobstructive pneumonia. She still complaining of shortness of breath and currently on home oxygen. The patient is a resident of the [facility] skilled nursing facility for rehabilitation. She still have 2 more weeks of physical therapy. She came today for reevaluation and discussion of her treatment options. She denied having any significant chest pain but continues to have shortness of breath at baseline and increased with exertion and mild cough with no hemoptysis. The patient denied having any significant weight loss or night sweats. She has no nausea or vomiting. She has generalized pain especially on the back and she is currently on fentanyl patch 100 g/hour every 3 days in addition to MS Contin 30 mg by mouth every 8 hours in addition to MSIR 15 mg every 4 hours as needed for severe pain.   MEDICAL HISTORY: Past Medical History  Diagnosis Date  . Cancer     left bronchioles    ALLERGIES:  is allergic to penicillins.  MEDICATIONS:  Current Outpatient Prescriptions  Medication Sig Dispense Refill  . acetaminophen  (TYLENOL) 325 MG tablet Take 2 tablets (650 mg total) by mouth every 6 (six) hours as needed for mild pain (or Fever >/= 101).    Marland Kitchen acidophilus (RISAQUAD) CAPS capsule Take 1 capsule by mouth daily. 30 capsule 0  . albuterol (PROVENTIL) (2.5 MG/3ML) 0.083% nebulizer solution 2.5 mg nebulized 3 times a day and every 4hrs as needed for wheezing or shortness of breath. 75 mL 12  . alum & mag hydroxide-simeth (MAALOX/MYLANTA) 200-200-20 MG/5ML suspension Take 30 mLs by mouth every 4 (four) hours as needed for indigestion or heartburn. 355 mL 0  . bisacodyl (DULCOLAX) 10 MG suppository Place 1 suppository (10 mg total) rectally as needed for moderate constipation. 12 suppository 0  . dexamethasone (DECADRON) 4 MG tablet Take 1 tablet (4 mg total) by mouth 2 (two) times daily. 60 tablet 0  . docusate sodium (COLACE) 100 MG capsule Take 1 capsule (100 mg total) by mouth 2 (two) times daily. 60 capsule 0  . enoxaparin (LOVENOX) 80 MG/0.8ML injection Inject 0.8 mLs (80 mg total) into the skin daily. 24 mL 2  . feeding supplement, RESOURCE BREEZE, (RESOURCE BREEZE) LIQD Take 1 Container by mouth 3 (three) times daily between meals. 90 Container 2  . fentaNYL (DURAGESIC - DOSED MCG/HR) 100 MCG/HR Place 1 patch (100 mcg total) onto the skin every 3 (three) days. 10 patch 0  . guaiFENesin-dextromethorphan (ROBITUSSIN DM) 100-10 MG/5ML syrup Take 5 mLs by mouth every 4 (four) hours as needed for cough. 118 mL 0  . ipratropium (ATROVENT) 0.02 % nebulizer solution Take 2.5 mLs (0.5 mg total) by nebulization 3 (three) times daily. 75 mL 12  .  LORazepam (ATIVAN) 1 MG tablet Take 1 tablet (1 mg total) by mouth every 6 (six) hours as needed for anxiety. 30 tablet 0  . metoprolol tartrate (LOPRESSOR) 25 MG tablet Take 1 tablet (25 mg total) by mouth 2 (two) times daily. 60 tablet 0  . morphine (MS CONTIN) 30 MG 12 hr tablet Take 1 tablet (30 mg total) by mouth every 8 (eight) hours. 90 tablet 0  . morphine (MSIR) 15 MG  tablet Take 1 tablet (15 mg total) by mouth every 4 (four) hours as needed for severe pain. 30 tablet 0  . nicotine (NICODERM CQ - DOSED IN MG/24 HOURS) 14 mg/24hr patch Place 1 patch (14 mg total) onto the skin daily. 28 patch 0  . omeprazole (PRILOSEC) 20 MG capsule Take 1 capsule (20 mg total) by mouth 2 (two) times daily before a meal. 60 capsule 1  . ondansetron (ZOFRAN) 4 MG tablet Take 1 tablet (4 mg total) by mouth every 8 (eight) hours as needed for nausea or vomiting. 20 tablet 0  . polyethylene glycol (MIRALAX / GLYCOLAX) packet Take 17 g by mouth 3 (three) times daily. May decrease to twice daily if you have frequent bowel movements. 120 each 2  . senna-docusate (SENOKOT-S) 8.6-50 MG per tablet Take 1 tablet by mouth at bedtime. 30 tablet 2   No current facility-administered medications for this visit.    SURGICAL HISTORY:  Past Surgical History  Procedure Laterality Date  . Esophagogastroduodenoscopy N/A 09/14/2014    Procedure: ESOPHAGOGASTRODUODENOSCOPY (EGD);  Surgeon: Teena Irani, MD;  Location: [facility];  Service: Endoscopy;  Laterality: N/A;    REVIEW OF SYSTEMS:  Constitutional: positive for anorexia and fatigue Eyes: negative Ears, nose, mouth, throat, and face: negative Respiratory: positive for cough and dyspnea on exertion Cardiovascular: negative Gastrointestinal: negative Genitourinary:negative Integument/breast: negative Hematologic/lymphatic: negative Musculoskeletal:positive for back pain and bone pain Neurological: negative Behavioral/Psych: negative Endocrine: negative Allergic/Immunologic: negative   PHYSICAL EXAMINATION: General appearance: alert, cooperative, fatigued and no distress Head: Normocephalic, without obvious abnormality, atraumatic Neck: no adenopathy, no JVD, supple, symmetrical, trachea midline and thyroid not enlarged, symmetric, no tenderness/mass/nodules Lymph nodes: Cervical, supraclavicular, and axillary nodes normal. Resp:  wheezes bilaterally Back: symmetric, no curvature. ROM normal. No CVA tenderness. Cardio: regular rate and rhythm, S1, S2 normal, no murmur, click, rub or gallop GI: soft, non-tender; bowel sounds normal; no masses,  no organomegaly Extremities: extremities normal, atraumatic, no cyanosis or edema Neurologic: Alert and oriented X 3, normal strength and tone. Normal symmetric reflexes. Normal coordination and gait  ECOG PERFORMANCE STATUS: 2 - Symptomatic, <50% confined to bed  Blood pressure 91/67, pulse 72, temperature 98.7 F (37.1 C), temperature source Oral, resp. rate 18, height '5\' 7"'$  (1.702 m), weight 117 lb 6.4 oz (53.252 kg), SpO2 100 %.  LABORATORY DATA: Lab Results  Component Value Date   WBC 20.2* 10/17/2014   HGB 11.9 10/17/2014   HCT 36.6 10/17/2014   MCV 86.7 10/17/2014   PLT 362 10/17/2014      Chemistry      Component Value Date/Time   NA 131* 10/17/2014 0850   NA 131* 09/29/2014 0540   K 3.9 10/17/2014 0850   K 5.1 09/29/2014 0540   CL 102 09/29/2014 0540   CO2 21* 10/17/2014 0850   CO2 22 09/29/2014 0540   BUN 10.2 10/17/2014 0850   BUN 21 09/29/2014 0540   CREATININE 0.6 10/17/2014 0850   CREATININE 0.56 09/29/2014 0540      Component Value Date/Time  CALCIUM 8.6 10/17/2014 0850   CALCIUM 9.5 09/29/2014 0540   ALKPHOS 79 10/17/2014 0850   ALKPHOS 81 09/27/2014 0550   AST 11 10/17/2014 0850   AST 14 09/27/2014 0550   ALT 11 10/17/2014 0850   ALT 6 09/27/2014 0550   BILITOT 0.25 10/17/2014 0850   BILITOT 0.7 09/27/2014 0550       RADIOGRAPHIC STUDIES: Dg Chest 1 View  09/20/2014   CLINICAL DATA:  Short of breath.  Evaluate Port-A-Cath.  EXAM: CHEST  1 VIEW  COMPARISON:  One day prior  FINDINGS: A right Port-A-Cath from an internal jugular approach terminates at the high SVC. The catheter extends superiorly and is angulated in the low neck prior to extending inferiorly. Midline trachea. Mild cardiomegaly. Moderate left pleural effusion and small  right pleural effusions are similar. No pneumothorax. Left lower lobe airspace disease is persistent. Patchy interstitial and airspace opacities throughout the right lung are not significantly changed.  IMPRESSION: Right internal jugular Port-A-Cath which is angled in the neck. Consider repositioning.  No significant change in appearance of the chest. Interstitial and airspace disease throughout the right lung which is suspicious for infection. Asymmetric pulmonary edema less likely.  Left base consolidation adjacent to the left pleural fluid is similar.  Trace right pleural fluid.   Electronically Signed   By: Abigail Miyamoto M.D.   On: 09/20/2014 16:02   Ct Angio Chest Pe W/cm &/or Wo Cm  09/18/2014   CLINICAL DATA:  49 year old female with left lung cancer, chemotherapy until February. Chest pain and shortness of breath for 1 week. Initial encounter.  EXAM: CT ANGIOGRAPHY CHEST WITH CONTRAST  TECHNIQUE: Multidetector CT imaging of the chest was performed using the standard protocol during bolus administration of intravenous contrast. Multiplanar CT image reconstructions and MIPs were obtained to evaluate the vascular anatomy.  CONTRAST:  13m OMNIPAQUE IOHEXOL 350 MG/ML SOLN  COMPARISON:  Chest CT 09/10/2014.  FINDINGS: Good contrast bolus timing in the pulmonary arterial tree. Mass effect on the left main pulmonary artery with narrowing, but the vessel remains patent. No left side pulmonary artery branch occlusion identified. Elsewhere no filling defect identified to suggest the presence of acute pulmonary embolism.  New bilateral pleural effusions, mostly loculated and/or sub pulmonic on the left and moderate on the right. Progressed pleural based soft tissue mass in the left upper lung measuring 2 cm now (1.7 cm last week). Small pericardial effusion is new. New widespread abnormal mostly interstitial or ground-glass opacity in the right lung. Only in the superior segment of the lower lobe is spared. New  abnormal confluent left lower lobe opacity along the surface of the diaphragm and also surrounding loculated appearing left lung base fluid. Stable Major airways with high-grade stenosis of the left mainstem bronchus. Post treatment related collapse of a portion of the medial left upper lung appears stable. Underlying emphysema.  Subjacent to the abnormal fluid and lung density at the left lung base the medial superior aspect of the spleen is now abnormally enhancing (see series 4, image 72 and coronal image 84 of series 605. This is patchy and not wedge-shaped. Also somewhat remote from this area there is mild hypo enhancement at the anterior aspect of the spleen. The appearance does not resemble normal early splenic artery enhancement.  The visible liver is grossly stable. Visible adrenal glands, renal parenchyma, pancreas, and bowel in the abdomen do not appear significantly changed.  Negative visualized aorta new abnormal mediastinal soft tissue or fluid most pronounced in  the sub carina region (series 4, image 43). Suggestion of associated thickening of the thoracic esophagus. There is small volume retained hyperdense material within the esophagus.  No axillary lymphadenopathy.  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 There may also be early involvement of the posterior tenth rib on series 7, image 78. There is a left lateral destructive soft tissue mass at the left eighth rib which has not significantly changed (series 7, image 75). Right chest porta cath remains in place. No destructive lesion identified in the spine.  Review of the MIP images confirms the above findings.  IMPRESSION: 1. No evidence of acute pulmonary embolus. Tumor versus post treatment fibrosis narrowing the left pulmonary arteries. 2. New severe abnormal pulmonary opacity in both lungs since 09/10/2014 most resembles bilateral pneumonia. Widespread  endobronchial spread of tumor also is possible. 3. New bilateral pleural effusions, loculated on the left and associated with pleural and chest wall tumor involvement. Subjacent abnormal enhancement in the spleen is suspicious and suspected to reflect infiltration of either tumor or infection through the diaphragm. 4. Multiple destructive left rib metastases, with pleural and chest wall involvement.   Electronically Signed   By: Genevie Ann M.D.   On: 09/18/2014 12:12   Ct Abdomen Pelvis W Contrast  09/28/2014   CLINICAL DATA:  Increasing right flank and abdominal pain. Patient with known lung cancer partially treated in [place]. Hematemesis. Recent diagnosis of radiation esophagitis, acute hypoxic respiratory failure and cancer associated pain.  EXAM: CT ABDOMEN AND PELVIS WITH CONTRAST  TECHNIQUE: Multidetector CT imaging of the abdomen and pelvis was performed using the standard protocol following bolus administration of intravenous contrast.  CONTRAST:  146m OMNIPAQUE IOHEXOL 300 MG/ML  SOLN  COMPARISON:  Recent chest CTs.  FINDINGS: Lower chest: There has been significant decrease in bilateral lower lung opacities. Scarring/ interstitial prominence within both lower lungs again noted. Nodular opacities/ consolidation in the right lower lobe lung base may represent atelectasis, infection or metastatic disease. A pericardial effusion is unchanged.  Hepatobiliary: The liver and gallbladder are unremarkable. There is no evidence of biliary dilatation.  Pancreas: Unremarkable  Spleen: Unremarkable  Adrenals/Urinary Tract: The kidneys, adrenal glands scratch the the kidneys and adrenal glands are unremarkable. A Foley catheter is noted within a distended bladder.  Stomach/Bowel: A large amount of stool within the colon, greatest in the right colon. There is no evidence of bowel obstruction or suspicious wall thickening. The appendix is unremarkable.  Vascular/Lymphatic: No enlarged lymph nodes or abdominal aortic  aneurysm.  Reproductive: Uterine fibroids are identified. No adnexal masses are noted.  Other: No free fluid, abscess or pneumoperitoneum.  Musculoskeletal: Metastases are identified as follows:  A 1.8 x 2.5 cm a metastasis between the left seventh and eighth ribs with rib involvement (image 12)  A 1.4 x 2.5 cm medial left pleural/chest wall metastasis (image 15)  A 1 x 2.4 cm posterior left pleural/ chest wall metastasis (image 18)  A 1.5 x 4 cm posterior left ninth rib soft tissue metastasis.  No acute bony abnormalities are identified. Degenerative discs scratch a moderate degenerative disc disease and chronic diffuse broad-based disc bulge at L5-S1 noted.  IMPRESSION: Left chest wall/pleural metastatic disease as described.  Large amount of colonic stool, greatest in the right colon. This could contribute to the patient's feeling of right abdominal discomfort.  Significantly improved aeration/opacities within both lower lungs. Persistent nodular opacities within the new right lower  lobe which may represent metastatic disease, infection or atelectasis.  Foley catheter within a distended bladder.   Electronically Signed   By: Margarette Canada M.D.   On: 09/28/2014 16:07   Ir Removal Tun Access W/ Port W/o Fl Mod Sed  09/23/2014   CLINICAL DATA:  Malfunctioning right IJ power port catheter with evidence of SVC thrombosis by contrast injection.  EXAM: RIGHT IJ TUNNEL POWER PORT CATHETER REMOVAL  Date:  4/5/20164/10/2014 3:25 pm  Radiologist:  M. Daryll Brod, MD  MEDICATIONS AND MEDICAL HISTORY: 1% lidocaine locally  COMPLICATIONS: None immediate  PROCEDURE: Informed consent was obtained from the patient following explanation of the procedure, risks, benefits and alternatives. The patient understands, agrees and consents for the procedure. All questions were addressed. A time out was performed.  Maximal barrier sterile technique utilized including caps, mask, sterile gowns, sterile gloves, large sterile drape, hand  hygiene, and ChloraPrep.  Under sterile conditions and local anesthesia, an incision was made along the existing port catheter scar. Blunt and sharp dissection performed to remove the catheter. Hemostasis obtained. Incision closed in a two-layer fashion with interrupted and running Vicryl suture. No immediate complication. Sterile dressing applied. The patient tolerated the procedure well.  IMPRESSION: Successful right IJ power port catheter removal.   Electronically Signed   By: Jerilynn Mages.  Shick M.D.   On: 09/23/2014 15:42   Ir Cv Line Injection  09/23/2014   CLINICAL DATA:  Malfunctioning right IJ power port catheter  EXAM: FLUOROSCOPIC INJECTION OF THE EXISTING RIGHT IJ POWER PORT  Date:  4/5/20164/10/2014 9:55 am  Radiologist:  M. Daryll Brod, MD  Guidance:  FLUOROSCOPIC  FLUOROSCOPY TIME:  6 SECONDS, 18 mGY  MEDICATIONS AND MEDICAL HISTORY: NONE.  ANESTHESIA/SEDATION: NONE  CONTRAST:  10 CC OMNIPAQUE 585  COMPLICATIONS: None immediate  PROCEDURE: Informed consent was obtained from the patient following explanation of the procedure, risks, benefits and alternatives. The patient understands, agrees and consents for the procedure. All questions were addressed. A time out was performed.  Under sterile conditions, the existing accessed right IJ power port catheter was injected under fluoroscopy with contrast. Subtraction imaging performed.  Right IJ port catheter tubing has a high course into the right neck, suspect related to high access of the jugular vein during placement. Port catheter tubing terminates in the proximal SVC just below the innominate vein confluence. Upon injection, nodular filling defects are present in SVC with sluggish flow compatible with SVC thrombosis, appearing nearly occlusive. No significant fibrin sheath about the catheter tip. Contrast does flow into the lower SVC and right atrium.  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  These results were  called by telephone at the time of interpretation on 09/23/2014 at 10:28 am to Dr. Phillips Climes , who verbally acknowledged these results.   Electronically Signed   By: Jerilynn Mages.  Shick M.D.   On: 09/23/2014 11:29   Ir US Guide Vasc Access Right  09/23/2014   CLINICAL DATA:  Malfunctioning port catheter with SVC thrombosis. Port catheter removed. Limited access.  EXAM: RIGHT UPPER EXTREMITY DOUBLE-LUMEN PERIPHERAL PICC LINE PLACEMENT WITH ULTRASOUND AND FLUOROSCOPIC GUIDANCE  FLUOROSCOPY TIME:  NONE.  PROCEDURE: The patient was advised of the possible risks andcomplications and agreed to undergo the procedure. The patient was then brought to the angiographic suite for the procedure.  The RIGHT arm was prepped with chlorhexidine, drapedin the usual sterile fashion using maximum barrier technique (cap and mask, sterile gown, sterile gloves, large sterile sheet, hand  hygiene and cutaneous antisepsis) and infiltrated locally with 1% Lidocaine.  Ultrasound demonstrated patency of the RIGHT BASILIC vein, and this was documented with an image. Under real-time ultrasound guidance, this vein was accessed with a 21 gauge micropuncture needle and image documentation was performed. A 0.018 wire was introduced in to the vein. Over this, a 5 [place] DOUBLE lumen POWER PICC was advanced INTO THE RIGHT BASILIC VEIN PERIPHERALLY. THIS SHOULD BE USED AS PERIPHERAL ACCESS ONLY. The catheter was flushed and covered with asterile dressing.  Catheter length: 9 CM  Complications: NONE IMMEDIATE  IMPRESSION: Successful right upper extremitydouble-lumen intentionally short power PICC line placement with ultrasound guidance. Catheter is only 9 cm in length with the tip in the right basilic vein peripherally. This should be used as peripheral access only. The catheter is ready for use.   Electronically Signed   By: Jerilynn Mages.  Shick M.D.   On: 09/23/2014 15:49   Dg Chest Port 1 View  09/29/2014   CLINICAL DATA:  Dyspnea.  Lung cancer.  EXAM: PORTABLE  CHEST - 1 VIEW  COMPARISON:  Two-view chest x-ray 09/24/2014  FINDINGS: The heart size is normal. Postsurgical changes of the left lung are evident. The left pleural effusion is slightly improved. Aeration in the right lung is improved as well. There is persistent interstitial or airspace disease opacity at the right base.  IMPRESSION: 1. Improved aeration right lung with persistent interstitial or airspace disease at the right base. 2. Postsurgical changes of the left lung with improving effusion.   Electronically Signed   By: San Morelle M.D.   On: 09/29/2014 13:23   Dg Chest Port 1 View  09/24/2014   CLINICAL DATA:  Pulmonary infiltrate.  EXAM: PORTABLE CHEST - 1 VIEW  COMPARISON:  09/23/2014.  09/22/2014 .  FINDINGS: Interim removal power Port catheter. Mediastinum and hilar structures are normal. Diffuse right lung infiltrate. Left lung base atelectasis and/or infiltrate with persistent left pleural effusion. No pneumothorax. Stable heart size.  IMPRESSION: 1. Interim removal of PowerPort catheter. 2. Persistent diffuse right lung infiltrate. Persistent atelectasis and or infiltrate left lung base with persistent left pleural effusion. No significant interim change.   Electronically Signed   By: Marcello Moores  Register   On: 09/24/2014 07:17   Dg Chest Port 1 View  09/23/2014   CLINICAL DATA:  Pulmonary infiltrates.  EXAM: PORTABLE CHEST - 1 VIEW  COMPARISON:  09/22/2014.  FINDINGS: Power port catheter noted with tip in the superior vena cava. Mediastinum and hilar structures are normal. Heart size stable. Persistent diffuse right lung infiltrate, no change. Small right pleural effusion cannot be excluded Persistent atelectasis left lower lobe with small left pleural effusion. No pneumothorax.  IMPRESSION: 1. Persistent diffuse right lung infiltrate. Small right pleural effusion cannot be excluded.  2. Persistent atelectasis of the left lower lobe with left pleural effusion. No change.   Electronically  Signed   By: Marcello Moores  Register   On: 09/23/2014 07:18   Dg Chest Port 1 View  09/22/2014   CLINICAL DATA:  Followup pleural effusion and pulmonary infiltrates. Left lung carcinoma.  EXAM: PORTABLE CHEST - 1 VIEW  COMPARISON:  09/20/2014  FINDINGS: Right-sided power port remains in stable position. A moderate left pleural effusion and left lower lung consolidation or collapse shows no significant change.  Diffuse asymmetric right lung interstitial infiltrates also appears stable, with tiny right pleural effusion which is unchanged. Differential diagnosis includes asymmetric edema, atypical infection, and lymphangitic spread of carcinoma.  IMPRESSION: No significant  change compared with prior exam.   Electronically Signed   By: Earle Gell M.D.   On: 09/22/2014 12:33   Dg Chest Port 1 View  09/19/2014   CLINICAL DATA:  Respiratory failure.  EXAM: PORTABLE CHEST - 1 VIEW  COMPARISON:  Chest CT 12 hr prior  FINDINGS: Tip of the right chest port remains in the proximal SVC. Unchanged elevation of left hemidiaphragm with left pleural effusion. Diffuse parenchymal opacity throughout the right lung, mildly worsened in the right upper lobes. Persistent blunting of right costophrenic angle. Cardiomediastinal contours are unchanged.  IMPRESSION: 1. Diffuse parenchymal opacity throughout the right lung, mild worsening in the right upper lobe. 2. Unchanged appearance of the left hemithorax.   Electronically Signed   By: Jeb Levering M.D.   On: 09/19/2014 00:44   Dg Chest Port 1 View  09/18/2014   CLINICAL DATA:  Respiratory failure, malignancy with history of left lung cancer July 2015 having undergone chemotherapy and radiation therapy completed February 2016  EXAM: PORTABLE CHEST - 1 VIEW  COMPARISON:  09/17/2014  FINDINGS: Moderate left pleural effusions stable. Volume loss left thorax with shift of mediastinum for the left, stable. Heavy interstitial change throughout the right lung stable, with milder interstitial  change on the left. Abnormal opacity retrocardiac left lower lobe stable. No change in position of Port-A-Cath. Stable mild right costophrenic angle blunting.  IMPRESSION: No significant change from prior study. Persistent left effusion and left lower lobe consolidation as well as interstitial prominence suggesting pulmonary edema.   Electronically Signed   By: Skipper Cliche M.D.   On: 09/18/2014 10:15   Dg Chest Port 1 View  09/18/2014   CLINICAL DATA:  Respiratory distress. Hypoxia. History of lung cancer.  EXAM: PORTABLE CHEST - 1 VIEW  COMPARISON:  Most recent radiograph earlier this day at 0122 hr, Chest CT 323 16  FINDINGS: Volume loss in the left hemithorax, however mildly improved aeration from prior exam. Left basilar consolidation and pleural effusion, mildly improved. Slight decrease in cardiomegaly. Interstitial edema, similar to prior. Multifocal parenchymal opacities in the periphery of the right lung, improved in the mid lung zone. Right chest port remains in place. Unchanged blunting of the right costophrenic angle. There is no pneumothorax.  IMPRESSION: 1. Improved radiographic appearance of the chest with slight improvement in left basilar and right mid lung zone aeration. 2. Pulmonary edema and multifocal opacities in the periphery of the right lung, unchanged.   Electronically Signed   By: Jeb Levering M.D.   On: 09/18/2014 00:14   Dg Abd Portable 2v  09/26/2014   CLINICAL DATA:  One-day history of nausea and vomiting ; history of lung malignancy  EXAM: PORTABLE ABDOMEN - 2 VIEW  COMPARISON:  None.  FINDINGS: There is a moderate volume of gas within the descending colon. The colon does not appear abnormally distended. Elsewhere the colon is unremarkable. There is no small bowel obstructive pattern. No free extraluminal gas collections are demonstrated. There is mild degenerative change of the lower lumbar spine.  IMPRESSION: Nonspecific bowel gas pattern. There is no evidence of  obstruction or ileus. If the patient's symptoms warrant further evaluation, abdominal and pelvic CT scanning would be the most useful next imaging modality.   Electronically Signed   By: David  Martinique   On: 09/26/2014 16:12   Ir Fluoro Guide Cv Midline Picc Right  09/23/2014   CLINICAL DATA:  Malfunctioning port catheter with SVC thrombosis. Port catheter removed. Limited access.  EXAM:  RIGHT UPPER EXTREMITY DOUBLE-LUMEN PERIPHERAL PICC LINE PLACEMENT WITH ULTRASOUND AND FLUOROSCOPIC GUIDANCE  FLUOROSCOPY TIME:  NONE.  PROCEDURE: The patient was advised of the possible risks andcomplications and agreed to undergo the procedure. The patient was then brought to the angiographic suite for the procedure.  The RIGHT arm was prepped with chlorhexidine, drapedin the usual sterile fashion using maximum barrier technique (cap and mask, sterile gown, sterile gloves, large sterile sheet, hand hygiene and cutaneous antisepsis) and infiltrated locally with 1% Lidocaine.  Ultrasound demonstrated patency of the RIGHT BASILIC vein, and this was documented with an image. Under real-time ultrasound guidance, this vein was accessed with a 21 gauge micropuncture needle and image documentation was performed. A 0.018 wire was introduced in to the vein. Over this, a 5 [place] DOUBLE lumen POWER PICC was advanced INTO THE RIGHT BASILIC VEIN PERIPHERALLY. THIS SHOULD BE USED AS PERIPHERAL ACCESS ONLY. The catheter was flushed and covered with asterile dressing.  Catheter length: 9 CM  Complications: NONE IMMEDIATE  IMPRESSION: Successful right upper extremitydouble-lumen intentionally short power PICC line placement with ultrasound guidance. Catheter is only 9 cm in length with the tip in the right basilic vein peripherally. This should be used as peripheral access only. The catheter is ready for use.   Electronically Signed   By: Jerilynn Mages.  Shick M.D.   On: 09/23/2014 15:49    ASSESSMENT AND PLAN: This is a very pleasant 49 years old white  female with metastatic non-small cell lung cancer, adenocarcinoma initially diagnosed as a stage IIIB status post course of concurrent chemoradiation. The patient is recovering from her recent admission with respiratory failure and postobstructive pneumonia. She is currently undergoing physical therapy for the next 2 weeks. I had a lengthy discussion with the patient and her brother today about her current condition and treatment options. I recommended for the patient to have repeat PET scan as well as MRI of the brain for further evaluation of her disease before starting with the next step in her systemic therapy. If her performance status improves, I may consider the patient for treatment with systemic chemotherapy with carboplatin and Alimta. This will be discussed with the patient and more detailed than her upcoming visit. For pain management she will continue with the current pain medication but I strongly advised the patient to start weaning herself slowly from this massive doses of narcotics. The patient will come back for follow-up visit in 2 weeks for reevaluation and more detailed discussion of her treatment options. 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 She was advised to call immediately if she has any concerning symptoms in the interval.  The patient voices understanding of current disease status and treatment options and is in agreement with the current care plan.  All questions were answered. The patient knows to call the clinic with any problems, questions or concerns. We can certainly see the patient much sooner if necessary.  I spent 20 minutes counseling the patient face to face. The total time spent in the appointment was 30 minutes.  Disclaimer: This note was dictated with voice recognition software. Similar sounding words can inadvertently be transcribed and may not be corrected upon review.

## 2014-10-17 NOTE — Telephone Encounter (Signed)
Gave avs & calendar for May. °

## 2014-10-22 ENCOUNTER — Encounter (HOSPITAL_COMMUNITY)
Admission: RE | Admit: 2014-10-22 | Discharge: 2014-10-22 | Disposition: A | Discharge status: Home / Self Care | Payer: 59 | Source: Ambulatory Visit | Attending: Internal Medicine | Admitting: Internal Medicine

## 2014-10-22 ENCOUNTER — Other Ambulatory Visit: Payer: Self-pay | Admitting: Internal Medicine

## 2014-10-22 ENCOUNTER — Ambulatory Visit (HOSPITAL_COMMUNITY)
Admission: RE | Admit: 2014-10-22 | Discharge: 2014-10-22 | Disposition: A | Discharge status: Home / Self Care | Payer: 59 | Source: Ambulatory Visit | Attending: Internal Medicine | Admitting: Internal Medicine

## 2014-10-22 DIAGNOSIS — C782 Secondary malignant neoplasm of pleura: Secondary | ICD-10-CM | POA: Insufficient documentation

## 2014-10-22 DIAGNOSIS — C7951 Secondary malignant neoplasm of bone: Secondary | ICD-10-CM | POA: Diagnosis not present

## 2014-10-22 DIAGNOSIS — C3492 Malignant neoplasm of unspecified part of left bronchus or lung: Secondary | ICD-10-CM | POA: Diagnosis not present

## 2014-10-22 DIAGNOSIS — Z08 Encounter for follow-up examination after completed treatment for malignant neoplasm: Secondary | ICD-10-CM | POA: Insufficient documentation

## 2014-10-22 DIAGNOSIS — C7981 Secondary malignant neoplasm of breast: Secondary | ICD-10-CM | POA: Diagnosis not present

## 2014-10-22 DIAGNOSIS — C781 Secondary malignant neoplasm of mediastinum: Secondary | ICD-10-CM | POA: Diagnosis not present

## 2014-10-22 MED ORDER — FLUDEOXYGLUCOSE F - 18 (FDG) INJECTION
6.6000 | Freq: Once | INTRAVENOUS | Status: AC | PRN
  Administered 2014-10-22: 6.6 via INTRAVENOUS

## 2014-10-22 MED ORDER — GADOBENATE DIMEGLUMINE 529 MG/ML IV SOLN
10.0000 mL | Freq: Once | INTRAVENOUS | Status: AC | PRN
  Administered 2014-10-22: 10 mL via INTRAVENOUS

## 2014-10-23 LAB — GLUCOSE, CAPILLARY: GLUCOSE-CAPILLARY: 85 mg/dL (ref 70–99)

## 2014-10-28 ENCOUNTER — Telehealth: Payer: Self-pay | Admitting: Internal Medicine

## 2014-10-28 ENCOUNTER — Ambulatory Visit (HOSPITAL_BASED_OUTPATIENT_CLINIC_OR_DEPARTMENT_OTHER): Payer: 59 | Admitting: Internal Medicine

## 2014-10-28 ENCOUNTER — Encounter: Payer: Self-pay | Admitting: Internal Medicine

## 2014-10-28 ENCOUNTER — Other Ambulatory Visit (HOSPITAL_BASED_OUTPATIENT_CLINIC_OR_DEPARTMENT_OTHER): Payer: 59

## 2014-10-28 VITALS — BP 98/63 | HR 71 | Temp 97.9°F | Resp 18 | Ht 67.0 in

## 2014-10-28 DIAGNOSIS — C3492 Malignant neoplasm of unspecified part of left bronchus or lung: Secondary | ICD-10-CM

## 2014-10-28 DIAGNOSIS — C7989 Secondary malignant neoplasm of other specified sites: Secondary | ICD-10-CM | POA: Diagnosis not present

## 2014-10-28 DIAGNOSIS — C782 Secondary malignant neoplasm of pleura: Secondary | ICD-10-CM

## 2014-10-28 DIAGNOSIS — C779 Secondary and unspecified malignant neoplasm of lymph node, unspecified: Secondary | ICD-10-CM

## 2014-10-28 DIAGNOSIS — C3490 Malignant neoplasm of unspecified part of unspecified bronchus or lung: Secondary | ICD-10-CM | POA: Diagnosis not present

## 2014-10-28 DIAGNOSIS — M545 Low back pain: Secondary | ICD-10-CM

## 2014-10-28 DIAGNOSIS — R079 Chest pain, unspecified: Secondary | ICD-10-CM

## 2014-10-28 DIAGNOSIS — I82210 Acute embolism and thrombosis of superior vena cava: Secondary | ICD-10-CM

## 2014-10-28 DIAGNOSIS — R0602 Shortness of breath: Secondary | ICD-10-CM

## 2014-10-28 DIAGNOSIS — R05 Cough: Secondary | ICD-10-CM

## 2014-10-28 LAB — CBC WITH DIFFERENTIAL/PLATELET
BASO%: 0.4 % (ref 0.0–2.0)
Basophils Absolute: 0.1 10*3/uL (ref 0.0–0.1)
EOS%: 0.2 % (ref 0.0–7.0)
Eosinophils Absolute: 0 10*3/uL (ref 0.0–0.5)
HEMATOCRIT: 35.3 % (ref 34.8–46.6)
HGB: 11.5 g/dL — ABNORMAL LOW (ref 11.6–15.9)
LYMPH#: 0.6 10*3/uL — AB (ref 0.9–3.3)
LYMPH%: 4.2 % — ABNORMAL LOW (ref 14.0–49.7)
MCH: 27.6 pg (ref 25.1–34.0)
MCHC: 32.5 g/dL (ref 31.5–36.0)
MCV: 84.8 fL (ref 79.5–101.0)
MONO#: 0.5 10*3/uL (ref 0.1–0.9)
MONO%: 3.5 % (ref 0.0–14.0)
NEUT#: 12.5 10*3/uL — ABNORMAL HIGH (ref 1.5–6.5)
NEUT%: 91.7 % — ABNORMAL HIGH (ref 38.4–76.8)
Platelets: 457 10*3/uL — ABNORMAL HIGH (ref 145–400)
RBC: 4.16 10*6/uL (ref 3.70–5.45)
RDW: 17.8 % — AB (ref 11.2–14.5)
WBC: 13.6 10*3/uL — AB (ref 3.9–10.3)

## 2014-10-28 LAB — COMPREHENSIVE METABOLIC PANEL (CC13)
ALT: 11 U/L (ref 0–55)
AST: 10 U/L (ref 5–34)
Albumin: 3 g/dL — ABNORMAL LOW (ref 3.5–5.0)
Alkaline Phosphatase: 83 U/L (ref 40–150)
Anion Gap: 13 mEq/L — ABNORMAL HIGH (ref 3–11)
BUN: 11.2 mg/dL (ref 7.0–26.0)
CO2: 22 meq/L (ref 22–29)
Calcium: 9 mg/dL (ref 8.4–10.4)
Chloride: 97 mEq/L — ABNORMAL LOW (ref 98–109)
Creatinine: 0.6 mg/dL (ref 0.6–1.1)
EGFR: >90 mL/min/{1.73_m2} (ref >90)
GLUCOSE: 195 mg/dL — AB (ref 70–140)
POTASSIUM: 4.4 meq/L (ref 3.5–5.1)
SODIUM: 132 meq/L — AB (ref 136–145)
TOTAL PROTEIN: 6.2 g/dL — AB (ref 6.4–8.3)
Total Bilirubin: <0.2 mg/dL (ref 0.20–1.20)

## 2014-10-28 LAB — TECHNOLOGIST REVIEW

## 2014-10-28 MED ORDER — MORPHINE SULFATE ER 15 MG PO TBCR: 15.0000 mg | EXTENDED_RELEASE_TABLET | Freq: Three times a day (TID) | ORAL | Status: DC

## 2014-10-28 MED ORDER — MORPHINE SULFATE ER 30 MG PO TBCR: 30.0000 mg | EXTENDED_RELEASE_TABLET | Freq: Three times a day (TID) | ORAL | Status: DC

## 2014-10-28 MED ORDER — MORPHINE SULFATE 15 MG PO TABS: 15.0000 mg | ORAL_TABLET | ORAL | Status: DC | PRN

## 2014-10-28 MED ORDER — METOPROLOL TARTRATE 25 MG PO TABS: 25.0000 mg | ORAL_TABLET | Freq: Two times a day (BID) | ORAL | Status: DC

## 2014-10-28 NOTE — Telephone Encounter (Signed)
[name] sch pt [id number] pt copy opf sch

## 2014-10-28 NOTE — Telephone Encounter (Signed)
per pof to sch pt appt-Per [name] radonc she will review chart before sch-pt has had radiation in [name] will call pt with appt

## 2014-10-28 NOTE — Progress Notes (Signed)
[facility] Telephone:([phone]) [phone]   [phone]  OFFICE PROGRESS NOTE  No primary care provider on file. No primary provider on file.  DIAGNOSIS:  1) Metastatic non-small cell lung cancer, squamous cell carcinoma initially diagnosed as a stage IIIB diagnosed in March 2015. 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  PRIOR THERAPY: Status post course of concurrent chemoradiation with weekly carboplatin and paclitaxel but the patient did not receive any consolidation chemotherapy.  CURRENT THERAPY: Immunotherapy with Nivolumab 3 MG/KG every 2 weeks. First dose expected 11/04/2014.  INTERVAL HISTORY: [name] 48 y.o. female returns to the clinic today for follow-up visit accompanied by her brother. The patient is currently at home after she was discharged from the skilled nursing facility. She is feeling fine except for the baseline shortness of breath as well as pain on the left side of her chest and back. She is currently on fentanyl patch 100 g/hour every 3 days in addition to MS Contin 30 mg by mouth every 8 hours in addition to MSIR 15 mg every 4 hours as needed for severe pain. The patient mentioned that her pain is just under control with this massive doses of pain medication and she does not think she can decrease her dose at this point but she may consider it in the future as tolerated. She was requesting refill of MS Contin as well as MSIR. She denied having any significant weight loss or night sweats. The patient continues to have left-sided chest pain with shortness of breath and mild cough with no hemoptysis. She has no fever or chills. The patient had a recent PET scan as well as MRI of the brain performed and she is here for evaluation and discussion of her scan results and treatment options.  MEDICAL HISTORY: Past Medical History  Diagnosis Date  . Cancer     left bronchioles     ALLERGIES:  is allergic to penicillins.  MEDICATIONS:  Current Outpatient Prescriptions  Medication Sig Dispense Refill  . acetaminophen (TYLENOL) 325 MG tablet Take 2 tablets (650 mg total) by mouth every 6 (six) hours as needed for mild pain (or Fever >/= 101).    Marland Kitchen albuterol (PROVENTIL) (2.5 MG/3ML) 0.083% nebulizer solution 2.5 mg nebulized 3 times a day and every 4hrs as needed for wheezing or shortness of breath. 75 mL 12  . alum & mag hydroxide-simeth (MAALOX/MYLANTA) 200-200-20 MG/5ML suspension Take 30 mLs by mouth every 4 (four) hours as needed for indigestion or heartburn. 355 mL 0  . dexamethasone (DECADRON) 4 MG tablet Take 1 tablet (4 mg total) by mouth 2 (two) times daily. 60 tablet 0  . docusate sodium (COLACE) 100 MG capsule Take 1 capsule (100 mg total) by mouth 2 (two) times daily. 60 capsule 0  . enoxaparin (LOVENOX) 80 MG/0.8ML injection Inject 0.8 mLs (80 mg total) into the skin daily. 24 mL 2  . feeding supplement, RESOURCE BREEZE, (RESOURCE BREEZE) LIQD Take 1 Container by mouth 3 (three) times daily between meals. 90 Container 2  . fentaNYL (DURAGESIC - DOSED MCG/HR) 50 MCG/HR     . fentaNYL (DURAGESIC - DOSED MCG/HR) 75 MCG/HR     . ipratropium (ATROVENT) 0.02 % nebulizer solution Take 2.5 mLs (0.5 mg total) by nebulization 3 (three) times daily. 75 mL 12  . LORazepam (ATIVAN) 1 MG tablet Take 1 tablet (1 mg total) by mouth every 6 (six) hours as needed for  anxiety. 30 tablet 0  . metoprolol tartrate (LOPRESSOR) 25 MG tablet Take 1 tablet (25 mg total) by mouth 2 (two) times daily. 60 tablet 0  . morphine (MS CONTIN) 15 MG 12 hr tablet Take 15 mg by mouth 3 (three) times daily.    Marland Kitchen morphine (MS CONTIN) 30 MG 12 hr tablet Take 1 tablet (30 mg total) by mouth every 8 (eight) hours. 90 tablet 0  . morphine (MSIR) 15 MG tablet Take 1 tablet (15 mg total) by mouth every 4 (four) hours as needed for severe pain. 30 tablet 0  . nicotine (NICODERM CQ - DOSED IN MG/24  HOURS) 14 mg/24hr patch Place 1 patch (14 mg total) onto the skin daily. 28 patch 0  . omeprazole (PRILOSEC) 20 MG capsule Take 1 capsule (20 mg total) by mouth 2 (two) times daily before a meal. 60 capsule 1  . ondansetron (ZOFRAN) 4 MG tablet Take 1 tablet (4 mg total) by mouth every 8 (eight) hours as needed for nausea or vomiting. 20 tablet 0  . polyethylene glycol (MIRALAX / GLYCOLAX) packet Take 17 g by mouth 3 (three) times daily. May decrease to twice daily if you have frequent bowel movements. 120 each 2  . senna-docusate (SENOKOT-S) 8.6-50 MG per tablet Take 1 tablet by mouth at bedtime. 30 tablet 2   No current facility-administered medications for this visit.    SURGICAL HISTORY:  Past Surgical History  Procedure Laterality Date  . Esophagogastroduodenoscopy N/A 09/14/2014    Procedure: ESOPHAGOGASTRODUODENOSCOPY (EGD);  Surgeon: Teena Irani, MD;  Location: [facility];  Service: Endoscopy;  Laterality: N/A;    REVIEW OF SYSTEMS:  Constitutional: positive for anorexia and fatigue Eyes: negative Ears, nose, mouth, throat, and face: negative Respiratory: positive for cough and dyspnea on exertion Cardiovascular: negative Gastrointestinal: negative Genitourinary:negative Integument/breast: negative Hematologic/lymphatic: negative Musculoskeletal:positive for back pain and bone pain Neurological: negative Behavioral/Psych: negative Endocrine: negative Allergic/Immunologic: negative   PHYSICAL EXAMINATION: General appearance: alert, cooperative, fatigued and no distress Head: Normocephalic, without obvious abnormality, atraumatic Neck: no adenopathy, no JVD, supple, symmetrical, trachea midline and thyroid not enlarged, symmetric, no tenderness/mass/nodules Lymph nodes: Cervical, supraclavicular, and axillary nodes normal. Resp: wheezes bilaterally Back: symmetric, no curvature. ROM normal. No CVA tenderness. Cardio: regular rate and rhythm, S1, S2 normal, no murmur, click,  rub or gallop GI: soft, non-tender; bowel sounds normal; no masses,  no organomegaly Extremities: extremities normal, atraumatic, no cyanosis or edema Neurologic: Alert and oriented X 3, normal strength and tone. Normal symmetric reflexes. Normal coordination and gait  ECOG PERFORMANCE STATUS: 2 - Symptomatic, <50% confined to bed  Blood pressure 98/63, pulse 71, temperature 97.9 F (36.6 C), temperature source Oral, resp. rate 18, height '5\' 7"'$  (1.702 m), last menstrual period 10/15/2014, SpO2 99 %.  LABORATORY DATA: Lab Results  Component Value Date   WBC 13.6* 10/28/2014   HGB 11.5* 10/28/2014   HCT 35.3 10/28/2014   MCV 84.8 10/28/2014   PLT 457* 10/28/2014      Chemistry      Component Value Date/Time   NA 132* 10/28/2014 0953   NA 131* 09/29/2014 0540   K 4.4 10/28/2014 0953   K 5.1 09/29/2014 0540   CL 102 09/29/2014 0540   CO2 22 10/28/2014 0953   CO2 22 09/29/2014 0540   BUN 11.2 10/28/2014 0953   BUN 21 09/29/2014 0540   CREATININE 0.6 10/28/2014 0953   CREATININE 0.56 09/29/2014 0540      Component Value Date/Time  CALCIUM 9.0 10/28/2014 0953   CALCIUM 9.5 09/29/2014 0540   ALKPHOS 83 10/28/2014 0953   ALKPHOS 81 09/27/2014 0550   AST 10 10/28/2014 0953   AST 14 09/27/2014 0550   ALT 11 10/28/2014 0953   ALT 6 09/27/2014 0550   BILITOT <0.20 10/28/2014 0953   BILITOT 0.7 09/27/2014 0550       RADIOGRAPHIC STUDIES: Mr Jeri Cos Wo Contrast  2014/10/25   CLINICAL DATA:  History of squamous cell lung cancer.  Staging.  EXAM: MRI HEAD WITHOUT AND WITH CONTRAST  TECHNIQUE: Multiplanar, multiecho pulse sequences of the brain and surrounding structures were obtained without and with intravenous contrast.  CONTRAST:  66m MULTIHANCE GADOBENATE DIMEGLUMINE 529 MG/ML IV SOLN  COMPARISON:  None.  FINDINGS: No evidence for acute infarction, hemorrhage, mass lesion, hydrocephalus, or extra-axial fluid. Premature for age cerebral and cerebellar atrophy. Minor white  matter disease.  Post infusion, no abnormal enhancement of the brain or meninges. Pituitary, pineal, and cerebellar tonsils unremarkable. No upper cervical lesions. Flow voids are maintained throughout the carotid, basilar, and vertebral arteries. There are no areas of chronic hemorrhage.  Visualized calvarium, skull base, and upper cervical osseous structures unremarkable. Scalp and extracranial soft tissues, orbits, sinuses, and mastoids show no acute process.  IMPRESSION: Chronic changes as described.  No evidence for intracranial metastatic disease on this 1.5 tesla scanner.   Electronically Signed   By: JRolla FlattenM.D.   On: 005/12/1615:16   Ct Abdomen Pelvis W Contrast  09/28/2014   CLINICAL DATA:  Increasing right flank and abdominal pain. [place]. Recent diagnosis of radiation esophagitis, acute hypoxic respiratory failure and cancer associated pain.  EXAM: CT ABDOMEN AND PELVIS WITH CONTRAST  TECHNIQUE: Multidetector CT imaging of the abdomen and pelvis was performed using the standard protocol following bolus administration of intravenous contrast.  CONTRAST:  1013mOMNIPAQUE IOHEXOL 300 MG/ML  SOLN  COMPARISON:  Recent chest CTs.  FINDINGS: Lower chest: There has been significant decrease in bilateral lower lung opacities. Scarring/ interstitial prominence within both lower lungs again noted. Nodular opacities/ consolidation in the right lower lobe lung base may represent atelectasis, infection or metastatic disease. A pericardial effusion is unchanged.  Hepatobiliary: The liver and gallbladder are unremarkable. There is no evidence of biliary dilatation.  Pancreas: Unremarkable  Spleen: Unremarkable  Adrenals/Urinary Tract: The kidneys, adrenal glands scratch the the kidneys and adrenal glands are unremarkable. A Foley catheter is noted within a distended bladder.  Stomach/Bowel: A large amount of stool within the colon, greatest in  the right colon. There is no evidence of bowel obstruction or suspicious wall thickening. The appendix is unremarkable.  Vascular/Lymphatic: No enlarged lymph nodes or abdominal aortic aneurysm.  Reproductive: Uterine fibroids are identified. No adnexal masses are noted.  Other: No free fluid, abscess or pneumoperitoneum.  Musculoskeletal: Metastases are identified as follows:  A 1.8 x 2.5 cm a metastasis between the left seventh and eighth ribs with rib involvement (image 12)  A 1.4 x 2.5 cm medial left pleural/chest wall metastasis (image 15)  A 1 x 2.4 cm posterior left pleural/ chest wall metastasis (image 18)  A 1.5 x 4 cm posterior left ninth rib soft tissue metastasis.  No acute bony abnormalities are identified. Degenerative discs scratch a moderate degenerative disc disease and chronic diffuse broad-based disc bulge at L5-S1 noted.  IMPRESSION: Left chest wall/pleural metastatic disease as described.  Large amount of colonic stool, greatest in the  right colon. This could contribute to the patient's feeling of right abdominal discomfort.  Significantly improved aeration/opacities within both lower lungs. Persistent nodular opacities within the new right lower lobe which may represent metastatic disease, infection or atelectasis.  Foley catheter within a distended bladder.   Electronically Signed   By: Margarette Canada M.D.   On: 09/28/2014 16:07   Nm Pet Image Restag (ps) Skull Base To Thigh  10/22/2014   CLINICAL DATA:  Initial encounter for treatment strategy for staging for squamous cell carcinoma.  EXAM: NUCLEAR MEDICINE PET SKULL BASE TO THIGH  TECHNIQUE: 6.6 mCi F-18 FDG was injected intravenously. Full-ring PET imaging was performed from the skull base to thigh after the radiotracer. CT data was obtained and used for attenuation correction and anatomic localization.  FASTING BLOOD GLUCOSE:  Value: 85 mg/dl  COMPARISON:  Abdominal pelvic CT of 09/28/2014. Most recent chest CT of 09/18/2014.  FINDINGS:  NECK  No areas of abnormal hypermetabolism.  CHEST  Left internal mammary nodal metastasis, measuring 6 mm and a S.U.V. max of 2.8 on image 66.  Extensive left-sided pleural and chest wall metastasis. An index posterior medial component is bilobed, measuring 3.2 x 1.8 cm and a S.U.V. max of 8.6 on image 65 of series 4. This is similar to on the prior CT.  Anterior pleural-based tumor extends in the left-sided mediastinum, including on image 68 of series 4. This is similar to on the prior.  A left pleural on intercostal mass between the seventh and eighth ribs measures 2.5 cm thickness and a S.U.V. max of 11.7. This is enlarged from 1.7 cm thickness on 09/18/2014 and 1.7 cm thickness on 09/28/2014.  Left pleural mass with adjacent ninth rib involvement measures 2.2 cm and a S.U.V. max of 11.7 on image 90. 1.7 cm on 09/28/2014.  Dilated esophagus with fluid within. Concurrent mid esophageal hypermetabolism, measuring a S.U.V. max of 7.0. Example image 74 of series 4.  ABDOMEN/PELVIS  No areas of abnormal hypermetabolism.  SKELETON  No other osseous hypermetabolism identified (other than involvement of the left-sided rib as detailed above.)  CT IMAGES PERFORMED FOR ATTENUATION CORRECTION  No cervical adenopathy.  Left pleural effusion is resolved. The right pleural effusion is nearly resolved. There is minimal loculated fluid identified in the inferior right pleural space. Advanced centrilobular emphysema. Improved pulmonary aeration bilaterally. Mild left lower lobe scarring remains.  Colonic stool burden suggests constipation. Markedly age advanced aortic and branch vessel atherosclerosis.  IMPRESSION: 1. Extensive left pleural and chest wall metastasis. Findings are progressive since 09/28/2014. 2. Medial left pleural tumor extends directly into the left-sided mediastinum. 3. Isolated left internal mammary nodal metastasis. 4. No extrathoracic metastatic disease identified. 5. Resolved left and nearly resolved right  pleural effusion. Minimal residual loculated right-sided pleural fluid remains. Improved pulmonary aeration with centrilobular emphysema and scarring. 6. Advanced atherosclerosis. 7. Esophageal air fluid level suggests dysmotility or gastroesophageal reflux. Hypermetabolism within the mid esophagus is favored to be physiologic or possibly related to esophagitis.   Electronically Signed   By: Abigail Miyamoto M.D.   On: 10/22/2014 16:17   Dg Chest Port 1 View  09/29/2014   CLINICAL DATA:  Dyspnea.  Lung cancer.  EXAM: PORTABLE CHEST - 1 VIEW  COMPARISON:  Two-view chest x-ray 09/24/2014  FINDINGS: The heart size is normal. Postsurgical changes of the left lung are evident. The left pleural effusion is slightly improved. Aeration in the right lung is improved as well. There is persistent interstitial or airspace  disease opacity at the right base.  IMPRESSION: 1. Improved aeration right lung with persistent interstitial or airspace disease at the right base. 2. Postsurgical changes of the left lung with improving effusion.   Electronically Signed   By: San Morelle M.D.   On: 09/29/2014 13:23    ASSESSMENT AND PLAN: This is a very pleasant 49 years old white female with metastatic non-small cell lung cancer, adenocarcinoma initially diagnosed as a stage IIIB status post course of concurrent chemoradiation. The patient is recovering from her recent admission with respiratory failure and postobstructive pneumonia. She is currently undergoing physical therapy for the next 2 weeks. The recent MRI of the brain showed no evidence for metastatic disease to the brain but the PET scan showed extensive left pleural and chest wall metastasis with mild left pleural tumor extending directly into the left sided mediastinum as well as isolated left internal mammary nodal metastasis. There was no evidence for extrathoracic metastatic disease. I discussed the scan results with the patient and her brother today. I had a  lengthy discussion with the patient and her brother about her treatment options. I gave the patient the option of palliative care and hospice referral versus consideration of treatment with immunotherapy with Nivolumab 3 MG/KG every 2 weeks versus systemic chemotherapy with docetaxel and Cyramza. The patient is interested in proceeding with immunotherapy. I discussed with her the adverse effect of the immunotherapy including on mediated skin rash, diarrhea, liver, renal, endocrine dysfunction. She would like to proceed with the treatment as soon as possible. The patient is expected to start the first cycle of this treatment on 11/04/2014. For pain management she will continue with the current pain medication for now but I strongly advised the patient to start weaning herself slowly from this massive doses of narcotics. I will also refer her to radiation oncology for consideration of palliative radiotherapy to the painful metastatic lesions. The patient a refill of her pain medication in the form of MS Contin 45 MG every 8 hours in addition to [place]. I'll also give her one-time refill of metoprolol until she establish care with a primary care physician. 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 The patient will come back for follow-up visit in 3 weeks for reevaluation and more detailed discussion of her treatment options. She was advised to call immediately if she has any concerning symptoms in the interval.  The patient voices understanding of current disease status and treatment options and is in agreement with the current care plan.  All questions were answered. The patient knows to call the clinic with any problems, questions or concerns. We can certainly see the patient much sooner if necessary.  I spent 25 minutes counseling the patient face to face. The total time spent in the appointment was 35 minutes.  Disclaimer: This  note was dictated with voice recognition software. Similar sounding words can inadvertently be transcribed and may not be corrected upon review.

## 2014-10-30 ENCOUNTER — Telehealth: Payer: Self-pay | Admitting: *Deleted

## 2014-10-30 MED ORDER — ENOXAPARIN SODIUM 80 MG/0.8ML ~~LOC~~ SOLN: 80.0000 mg | SUBCUTANEOUS | Status: DC

## 2014-10-30 MED ORDER — DEXAMETHASONE 4 MG PO TABS: 4.0000 mg | ORAL_TABLET | Freq: Every day | ORAL | Status: DC

## 2014-10-30 MED ORDER — IPRATROPIUM BROMIDE 0.02 % IN SOLN: 0.5000 mg | Freq: Three times a day (TID) | RESPIRATORY_TRACT | Status: DC

## 2014-10-30 NOTE — Telephone Encounter (Signed)
PT. TO HAVE AN APPOINTMENT WITH DR.GARBA, PRIMARY CARE PHYSICIAN, NEXT WEEK. VERBAL ORDER AND READ BACK TO DR.MOHAMED- MAY REFILL THE ABOVE MEDICATIONS UNTIL PT. IS SEEN BY DR.GARBA.

## 2014-11-04 ENCOUNTER — Telehealth: Payer: Self-pay | Admitting: Medical Oncology

## 2014-11-04 ENCOUNTER — Telehealth: Payer: Self-pay | Admitting: *Deleted

## 2014-11-04 NOTE — Progress Notes (Signed)
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  Biopsies of   Tobacco/Marijuana/Snuff/ETOH TSV:XBLT smoking on 09/07/14.No alcohol or illicit drug use.  Past/Anticipated interventions by cardiothoracic surgery, if any:09/14/14:bronchoscopy   Past/Anticipated interventions by medical oncology, if JQZ:ESPQ to start first dose of immunotherapy with Nivolumab 3 mg/kg every 2 weeks on 11/04/14.  Signs/Symptoms  Weight changes, if any:Has regained 12 lbs since home from [facility]   Respiratory complaints, if any: shortness of breath, on home oxygen  Hemoptysis, if any: No  Pain issues, if ZRA:QTMA of left thoracic chest wall.Has new onset pain of nose with swelling over the last 2 days.  SAFETY ISSUES:  Prior radiation? Concurrent chemoradiation with weekly carboplatin and paclitaxel.States shecompleted radiation to lung July 2015 in [place].  Pacemaker/ICD?  No                                                                                                                                                                                                                                                                                                                                                           Possible current pregnancy?No  Is the patient on methotrexate?No   Current Complaints / other details: Lives with brother.

## 2014-11-04 NOTE — Telephone Encounter (Signed)
Asking if [name] will sign orders for PT and RN [facility] ( [facility]) . Note to [place].

## 2014-11-04 NOTE — Telephone Encounter (Signed)
Pete Physical Therapist with care [facility] called reporting patient discharged from [facility].  Would like orders to continue PT.  Call transferred to collaborative nurse.

## 2014-11-05 ENCOUNTER — Other Ambulatory Visit: Payer: Self-pay | Admitting: Internal Medicine

## 2014-11-06 ENCOUNTER — Encounter: Payer: Self-pay | Admitting: *Deleted

## 2014-11-06 ENCOUNTER — Telehealth: Payer: Self-pay

## 2014-11-06 ENCOUNTER — Ambulatory Visit
Admission: RE | Admit: 2014-11-06 | Discharge: 2014-11-06 | Disposition: A | Discharge status: Home / Self Care | Payer: 59 | Source: Ambulatory Visit | Attending: Radiation Oncology | Admitting: Radiation Oncology

## 2014-11-06 ENCOUNTER — Ambulatory Visit: Payer: 59 | Admitting: Radiation Oncology

## 2014-11-06 ENCOUNTER — Telehealth: Payer: Self-pay | Admitting: Medical Oncology

## 2014-11-06 VITALS — BP 99/63 | HR 88 | Resp 18 | Wt 132.6 lb

## 2014-11-06 DIAGNOSIS — C3492 Malignant neoplasm of unspecified part of left bronchus or lung: Secondary | ICD-10-CM

## 2014-11-06 DIAGNOSIS — Z87891 Personal history of nicotine dependence: Secondary | ICD-10-CM | POA: Diagnosis not present

## 2014-11-06 DIAGNOSIS — C7951 Secondary malignant neoplasm of bone: Secondary | ICD-10-CM | POA: Diagnosis not present

## 2014-11-06 DIAGNOSIS — Z923 Personal history of irradiation: Secondary | ICD-10-CM | POA: Insufficient documentation

## 2014-11-06 DIAGNOSIS — Z9221 Personal history of antineoplastic chemotherapy: Secondary | ICD-10-CM | POA: Insufficient documentation

## 2014-11-06 DIAGNOSIS — Z510 Encounter for antineoplastic radiation therapy: Secondary | ICD-10-CM | POA: Diagnosis not present

## 2014-11-06 DIAGNOSIS — R0781 Pleurodynia: Secondary | ICD-10-CM | POA: Diagnosis not present

## 2014-11-06 DIAGNOSIS — C3490 Malignant neoplasm of unspecified part of unspecified bronchus or lung: Secondary | ICD-10-CM

## 2014-11-06 MED ORDER — LORAZEPAM 1 MG PO TABS: 1.0000 mg | ORAL_TABLET | Freq: Four times a day (QID) | ORAL | Status: DC | PRN

## 2014-11-06 MED ORDER — HYDROMORPHONE HCL 4 MG/ML IJ SOLN
1.0000 mg | Freq: Once | INTRAMUSCULAR | Status: AC
Start: 2014-11-06 — End: 2014-11-06
  Administered 2014-11-06: 1 mg via INTRAMUSCULAR
  Filled 2014-11-06: qty 1

## 2014-11-06 MED ORDER — CLINDAMYCIN HCL 300 MG PO CAPS: 300.0000 mg | ORAL_CAPSULE | Freq: Three times a day (TID) | ORAL | Status: DC

## 2014-11-06 MED ORDER — FENTANYL 100 MCG/HR TD PT72: 100.0000 ug | MEDICATED_PATCH | TRANSDERMAL | Status: DC

## 2014-11-06 MED ORDER — DEXAMETHASONE 4 MG PO TABS: 4.0000 mg | ORAL_TABLET | Freq: Every day | ORAL | Status: DC

## 2014-11-06 MED ORDER — ENOXAPARIN SODIUM 80 MG/0.8ML ~~LOC~~ SOLN: SUBCUTANEOUS | Status: DC

## 2014-11-06 MED ORDER — MORPHINE SULFATE 15 MG PO TABS: 30.0000 mg | ORAL_TABLET | ORAL | Status: DC | PRN

## 2014-11-06 MED ORDER — METOPROLOL TARTRATE 25 MG PO TABS: 25.0000 mg | ORAL_TABLET | Freq: Two times a day (BID) | ORAL | Status: DC

## 2014-11-06 NOTE — Progress Notes (Signed)
[place] Psychosocial Distress Screening Clinical Social Work  Clinical Social Work was referred by distress screening protocol and at request of Dr. Pablo Ledger.  The patient scored a 5 on the Psychosocial Distress Thermometer which indicates moderate distress. Clinical Social Worker met with pt and her brother, [name] to assess for distress and other psychosocial needs. Pt recently moved here from [place], was recently at [facility] for rehab and now resides at her brother and sister in [place] home. Sister in law recently had surgery as well and brother has been staying home to care for "both of his girls". The home appears well set up and equipped for people with limited mobility. Pt has a walker at home, raiser toilet seats and chair lift to assist her. Per brother, pt has been able to get around very well and also complete PT through [facility]. Pt has an appt with a possible PCP, but not until December 02, 2014. Asa result, she is very anxious about her pain management. This was addressed fully by Dr. Pablo Ledger today, who has also made a referral for a nerve block. Pt has good support from extended family and is getting connected to resources/providers in the area. Pt's brother appears to be a good advocate for pt. CSW provided pt and brother contact info for CSW team. They are aware to contact as needed.   ONCBCN DISTRESS SCREENING 11/06/2014  Screening Type Initial Screening  Distress experienced in past week (1-10) 5  Physical Problem type Pain;Sleep/insomnia;Other (comment)  Physician notified of physical symptoms Yes  Referral to clinical psychology No  Referral to clinical social work Yes  Referral to dietition No  Referral to palliative care Yes  Other concerned about swollen nose    Clinical Social Worker follow up needed: No.  If yes, follow up plan: Loren Racer, [place]  [facility] Phone: [phone] Fax: [phone]

## 2014-11-06 NOTE — Telephone Encounter (Signed)
Left message for pt to call.

## 2014-11-06 NOTE — Telephone Encounter (Signed)
-----   Message from Curt Bears, MD sent at 11/05/2014  5:40 PM EDT ----- Will sign orderes for now. She needs to have a PCP. We gaver her Dr. Leontine Locket contact information.  ----- Message -----    From: Ardeen Garland, RN    Sent: 11/04/2014   2:32 PM      To: Curt Bears, MD  Pt home now and needs continuing PT .Pt does not have PCP  Will you sign off on orders?

## 2014-11-06 NOTE — Telephone Encounter (Signed)
Patient was given 1 mg dilaudid im while in office this morning.Called to check on status.Patient states pain medication did help.She ate lunch and has been resting comfortably as communicated between brother and patient.

## 2014-11-06 NOTE — Progress Notes (Signed)
Radiation Oncology         [phone]) [phone] ________________________________  Initial outpatient Consultation - Date: 11/06/2014   Name: [name] MRN: [id number]   DOB: [birth year]  REFERRING PHYSICIAN: Curt Bears, MD  DIAGNOSIS AND STAGE: Lung cancer   Staging form: Lung, AJCC 7th Edition     Clinical stage from 10/28/2014: Stage IV (T2b, N3, M1b) - Signed by Curt Bears, MD on 10/28/2014       Staging comments: Squamous cell carcinoma  Squamous cell lung cancer   Staging form: Lung, AJCC 7th Edition     Clinical stage from 10/28/2014: Stage IV (T2b, N3, M1b) - Unsigned   HISTORY OF PRESENT ILLNESS::[name] is a 49 y.o. female with no significant past medical history except for long history of smoking. The patient arrived recently to [place] from [place], [place] to be closer to her brother. She  was diagnosed in March 2015 with a stage IIIB non-small cell lung cancer, squamous cell carcinoma after she presented with midthoracic back pain. The patient was treated with a course of concurrent chemoradiation with weekly carboplatin and paclitaxel. She received 38 fractions of radiotherapy to a total dose of 68.4 Gy completed 12/13/13. Imaging studies at that time showed interval response with decrease in the chronic left hydropneumothorax with reexpansion of the left upper lobe. The infiltrative mass involving the left hilum also improved. She struggled with pain and was placed on Fentanyl and oxycodone.  She mostly complained of anterior and lateral chest wall pain and left neck and shoulder pain with paresthesias to her left arm. She was scheduled as an outpatient to see Dr. Julien Nordmann but was admitted for pain and a pneumonia.  She was hospitalized for several weeks and has recently been discharged. She was seen by palliative care. Work up has included a PET CT on 5/4 which showed extensive left pleural and chest wall metastases with rib involvement as well as a left IM  nodal metastases. MRI of the brain is negative for metastatic disease.  Pt presented to the clinic in a wheelchair with her brother.The pt is extremely fatigued and has a lot of pain in her ribs, back, and hips. Feel like her ribs "are cracked" and is in tears from the pain. Pt states the morphine wears off quickly. She denies redness on the chest and back during prior radiation to the center mediastinum and the chest. The pt's nose is also swollen. The pt's brother states that it "looked like a zit" appeared on her nose about a week ago and then her entire nose started to swell on the right side and has now spread to the other side. Her nose appears to be red. She has difficulty breathing through her nose and from the pain in her ribs. The pt states she does see a physical therapist. She is running out the morphine IR. She is using miralax and had a bowel movement yesterday. She needs refills on her other medications as she does not have a PCP here yet and could not get an appointment for a month.   PREVIOUS RADIATION THERAPY: Yes. 68.4 Gy completed 11/2013.  Past medical, social and family history were reviewed in the electronic chart. Review of symptoms was reviewed in the electronic chart. Medications were reviewed in the electronic chart.   PHYSICAL EXAM:  Filed Vitals:   11/06/14 0841  BP: 99/63  Pulse: 88  Resp: 18  .132 lb 9.6 oz (60.147 kg). The right inside of the nose  is tender to palpation. Swelling on the right lateral nasal wall consistent with tumor vs infection. Patient is in a wheelchair and is in tears with pain. She is alert and oriented. She has normal respiratory effort.   IMPRESSION: Metastatic lung cancer with rib pain.   PLAN: I will contact her radiologist in [place], [place] to get her prior radiation records. I will refer her to Dr. Nelva Bush for possible nerve block/pain management and his office will call her to schedule an appointment for next week. After her appointment with  Dr. Nelva Bush , I will schedule CT sim and plan for her radiation treatment. I had hoped to schedule that today but she did not feel up to it.  I will also refer the patient to ENT (Dr. Constance Holster) for her nose swelling next week for biopsy/incision and drainage.  In the meantime, I will put her on clindamycin 300 mg tid for 10 days to cover the nose if this is infection. Until we get her pain under better control, the nose is really a moot point.  I asked her to call if the nose worsens over the next few days.    I have refilled the morphine (MSIR) 15 mg 2 tab with 90 tablets and the Fentanyl 148mg until she sees her PCP in a month. I also gave '1mg'$  of dilaudid IM in the clinic to help with the patient's pain. We are unfortunately not able to give more than that in our clinic.  I have refilled her other medications and asked her to go down on her metoprolol to 1 time a day as her BP has been running in the upper 945Ysystolic.   My nurse will follow up with her early next week to see how the appointment with Dr. RNelva Bushwent and to schedule simulation.  I have also contacted our palliative care NP who saw her as an inpatient.   She has home health and PT at home.  I suspect she will need hospice services soon.   I spent 60 minutes  face to face with the patient and more than 50% of that time was spent in counseling and/or coordination of care.  This document serves as a record of services personally performed by SThea Silversmith MD. It was created on her behalf by JDarcus Austin a trained medical scribe. The creation of this record is based on the scribe's personal observations and the provider's statements to them. This document has been checked and approved by the attending provider.    ------------------------------------------------  SThea Silversmith MD

## 2014-11-06 NOTE — Progress Notes (Signed)
Please see the Nurse Progress Note in the MD Initial Consult Encounter for this patient. 

## 2014-11-07 ENCOUNTER — Telehealth: Payer: Self-pay | Admitting: *Deleted

## 2014-11-07 NOTE — Telephone Encounter (Signed)
Called patient to inform of appt. With Dr. Nelva Bush on 11-11-14- arrival time - 8:40 am, spoke with patient and she is aware of this appt.

## 2014-11-10 ENCOUNTER — Telehealth: Payer: Self-pay | Admitting: *Deleted

## 2014-11-10 NOTE — Telephone Encounter (Signed)
Called patient to ask question, lvm for a return call

## 2014-11-10 NOTE — Progress Notes (Signed)
Request for medical records to include Radiation treatment plan, structures,images, dose and oncology notes from [facility]. Phone # [phone] fax # [phone].

## 2014-11-10 NOTE — Telephone Encounter (Signed)
CALLED PATIENT TO INFORM OF APPT. WITH DR. Constance Holster ON 11-13-14- ARRIVAL TIME - 8:40 AM , PATIENT TO BRING $100.00 WITH HER, SPOKE WITH PATIENT AND SHE UNDERSTOOD ABOUT APPT. AND ABOUT BRINGING THE $100.00

## 2014-11-11 NOTE — Telephone Encounter (Signed)
Call placed to [facility], all future home care orders will need to be sent to pt PCP.

## 2014-11-12 ENCOUNTER — Other Ambulatory Visit: Payer: Self-pay | Admitting: *Deleted

## 2014-11-12 DIAGNOSIS — C3490 Malignant neoplasm of unspecified part of unspecified bronchus or lung: Secondary | ICD-10-CM

## 2014-11-13 ENCOUNTER — Telehealth: Payer: Self-pay | Admitting: *Deleted

## 2014-11-13 ENCOUNTER — Telehealth: Payer: Self-pay

## 2014-11-13 NOTE — Telephone Encounter (Signed)
Returned patient's phone call, lvm for a return call 

## 2014-11-13 NOTE — Telephone Encounter (Signed)
Patient will need simulation appointment.I will have RT in ct to call with appointment to be scheduled for next week.

## 2014-11-14 ENCOUNTER — Other Ambulatory Visit: Payer: Self-pay | Admitting: Medical Oncology

## 2014-11-14 DIAGNOSIS — C3490 Malignant neoplasm of unspecified part of unspecified bronchus or lung: Secondary | ICD-10-CM

## 2014-11-18 ENCOUNTER — Ambulatory Visit (HOSPITAL_BASED_OUTPATIENT_CLINIC_OR_DEPARTMENT_OTHER): Payer: 59 | Admitting: Oncology

## 2014-11-18 ENCOUNTER — Other Ambulatory Visit: Payer: 59

## 2014-11-18 ENCOUNTER — Ambulatory Visit (HOSPITAL_BASED_OUTPATIENT_CLINIC_OR_DEPARTMENT_OTHER): Payer: 59

## 2014-11-18 ENCOUNTER — Other Ambulatory Visit (HOSPITAL_BASED_OUTPATIENT_CLINIC_OR_DEPARTMENT_OTHER): Payer: 59

## 2014-11-18 ENCOUNTER — Telehealth: Payer: Self-pay

## 2014-11-18 ENCOUNTER — Ambulatory Visit: Payer: 59 | Admitting: Nurse Practitioner

## 2014-11-18 ENCOUNTER — Encounter: Payer: Self-pay | Admitting: Oncology

## 2014-11-18 ENCOUNTER — Other Ambulatory Visit: Payer: Self-pay | Admitting: Medical Oncology

## 2014-11-18 VITALS — BP 102/71 | HR 85 | Temp 98.5°F | Resp 18 | Ht 67.0 in | Wt 130.2 lb

## 2014-11-18 DIAGNOSIS — R0602 Shortness of breath: Secondary | ICD-10-CM

## 2014-11-18 DIAGNOSIS — C3490 Malignant neoplasm of unspecified part of unspecified bronchus or lung: Secondary | ICD-10-CM | POA: Diagnosis not present

## 2014-11-18 DIAGNOSIS — I82210 Acute embolism and thrombosis of superior vena cava: Secondary | ICD-10-CM

## 2014-11-18 DIAGNOSIS — Z5112 Encounter for antineoplastic immunotherapy: Secondary | ICD-10-CM

## 2014-11-18 DIAGNOSIS — C3492 Malignant neoplasm of unspecified part of left bronchus or lung: Secondary | ICD-10-CM | POA: Diagnosis not present

## 2014-11-18 DIAGNOSIS — C782 Secondary malignant neoplasm of pleura: Secondary | ICD-10-CM

## 2014-11-18 DIAGNOSIS — C779 Secondary and unspecified malignant neoplasm of lymph node, unspecified: Secondary | ICD-10-CM

## 2014-11-18 DIAGNOSIS — C7989 Secondary malignant neoplasm of other specified sites: Secondary | ICD-10-CM | POA: Diagnosis not present

## 2014-11-18 DIAGNOSIS — G893 Neoplasm related pain (acute) (chronic): Secondary | ICD-10-CM

## 2014-11-18 DIAGNOSIS — R079 Chest pain, unspecified: Secondary | ICD-10-CM

## 2014-11-18 DIAGNOSIS — Z79899 Other long term (current) drug therapy: Secondary | ICD-10-CM

## 2014-11-18 DIAGNOSIS — R05 Cough: Secondary | ICD-10-CM

## 2014-11-18 LAB — COMPREHENSIVE METABOLIC PANEL (CC13)
ALT: <6 U/L (ref 0–55)
AST: 10 U/L (ref 5–34)
Albumin: 3 g/dL — ABNORMAL LOW (ref 3.5–5.0)
Alkaline Phosphatase: 91 U/L (ref 40–150)
Anion Gap: 10 mEq/L (ref 3–11)
BILIRUBIN TOTAL: 0.38 mg/dL (ref 0.20–1.20)
BUN: 10 mg/dL (ref 7.0–26.0)
CO2: 24 mEq/L (ref 22–29)
CREATININE: 0.6 mg/dL (ref 0.6–1.1)
Calcium: 9 mg/dL (ref 8.4–10.4)
Chloride: 96 mEq/L — ABNORMAL LOW (ref 98–109)
EGFR: >90 mL/min/{1.73_m2} (ref >90)
Glucose: 189 mg/dl — ABNORMAL HIGH (ref 70–140)
Potassium: 4.4 mEq/L (ref 3.5–5.1)
Sodium: 130 mEq/L — ABNORMAL LOW (ref 136–145)
Total Protein: 6.5 g/dL (ref 6.4–8.3)

## 2014-11-18 LAB — CBC WITH DIFFERENTIAL/PLATELET
BASO%: 0 % (ref 0.0–2.0)
BASOS ABS: 0 10*3/uL (ref 0.0–0.1)
EOS%: 0.2 % (ref 0.0–7.0)
Eosinophils Absolute: 0 10*3/uL (ref 0.0–0.5)
HCT: 32.7 % — ABNORMAL LOW (ref 34.8–46.6)
HGB: 10.7 g/dL — ABNORMAL LOW (ref 11.6–15.9)
LYMPH#: 0.2 10*3/uL — AB (ref 0.9–3.3)
LYMPH%: 1.5 % — ABNORMAL LOW (ref 14.0–49.7)
MCH: 26.8 pg (ref 25.1–34.0)
MCHC: 32.8 g/dL (ref 31.5–36.0)
MCV: 81.8 fL (ref 79.5–101.0)
MONO#: 0.8 10*3/uL (ref 0.1–0.9)
MONO%: 4.7 % (ref 0.0–14.0)
NEUT#: 15.7 10*3/uL — ABNORMAL HIGH (ref 1.5–6.5)
NEUT%: 93.6 % — ABNORMAL HIGH (ref 38.4–76.8)
Platelets: 607 10*3/uL — ABNORMAL HIGH (ref 145–400)
RBC: 4 10*6/uL (ref 3.70–5.45)
RDW: 17.9 % — AB (ref 11.2–14.5)
WBC: 16.7 10*3/uL — ABNORMAL HIGH (ref 3.9–10.3)

## 2014-11-18 LAB — TECHNOLOGIST REVIEW

## 2014-11-18 LAB — TSH CHCC: TSH: 0.998 m(IU)/L (ref 0.308–3.960)

## 2014-11-18 MED ORDER — ONDANSETRON HCL 8 MG PO TABS: 8.0000 mg | ORAL_TABLET | Freq: Three times a day (TID) | ORAL | Status: DC | PRN

## 2014-11-18 MED ORDER — IPRATROPIUM BROMIDE 0.02 % IN SOLN: 0.5000 mg | Freq: Three times a day (TID) | RESPIRATORY_TRACT | Status: AC

## 2014-11-18 MED ORDER — MORPHINE SULFATE ER 30 MG PO TBCR: 30.0000 mg | EXTENDED_RELEASE_TABLET | Freq: Three times a day (TID) | ORAL | Status: DC

## 2014-11-18 MED ORDER — ENOXAPARIN SODIUM 80 MG/0.8ML ~~LOC~~ SOLN: SUBCUTANEOUS | Status: DC

## 2014-11-18 MED ORDER — SODIUM CHLORIDE 0.9 % IV SOLN
3.3000 mg/kg | Freq: Once | INTRAVENOUS | Status: AC
  Administered 2014-11-18: 180 mg via INTRAVENOUS
  Filled 2014-11-18: qty 18

## 2014-11-18 MED ORDER — MORPHINE SULFATE 15 MG PO TABS: 30.0000 mg | ORAL_TABLET | ORAL | Status: DC | PRN

## 2014-11-18 MED ORDER — MORPHINE SULFATE ER 15 MG PO TBCR: 15.0000 mg | EXTENDED_RELEASE_TABLET | Freq: Three times a day (TID) | ORAL | Status: DC

## 2014-11-18 MED ORDER — FENTANYL 100 MCG/HR TD PT72: 100.0000 ug | MEDICATED_PATCH | TRANSDERMAL | Status: DC

## 2014-11-18 MED ORDER — MORPHINE SULFATE 4 MG/ML IJ SOLN
2.0000 mg | INTRAMUSCULAR | Status: AC
  Administered 2014-11-18: 2 mg via INTRAVENOUS

## 2014-11-18 MED ORDER — MORPHINE SULFATE 4 MG/ML IJ SOLN
INTRAMUSCULAR | Status: AC
  Filled 2014-11-18: qty 1

## 2014-11-18 MED ORDER — LORAZEPAM 1 MG PO TABS: 1.0000 mg | ORAL_TABLET | Freq: Four times a day (QID) | ORAL | Status: DC | PRN

## 2014-11-18 MED ORDER — SODIUM CHLORIDE 0.9 % IV SOLN
Freq: Once | INTRAVENOUS | Status: AC
  Administered 2014-11-18: 12:00:00 via INTRAVENOUS

## 2014-11-18 NOTE — Patient Instructions (Addendum)
[facility] Discharge Instructions for Patients Receiving Chemotherapy  Today you received the following chemotherapy agents NIVOLUMAB   To help prevent nausea and vomiting after your treatment, we encourage you to take your nausea medication ZOFRAN if needed   If you develop nausea and vomiting that is not controlled by your nausea medication, call the clinic.   BELOW ARE SYMPTOMS THAT SHOULD BE REPORTED IMMEDIATELY:  *FEVER GREATER THAN 100.5 F  *CHILLS WITH OR WITHOUT FEVER  NAUSEA AND VOMITING THAT IS NOT CONTROLLED WITH YOUR NAUSEA MEDICATION  *UNUSUAL SHORTNESS OF BREATH  *UNUSUAL BRUISING OR BLEEDING  TENDERNESS IN MOUTH AND THROAT WITH OR WITHOUT PRESENCE OF ULCERS  *URINARY PROBLEMS  *BOWEL PROBLEMS  UNUSUAL RASH Items with * indicate a potential emergency and should be followed up as soon as possible.  Feel free to call the clinic you have any questions or concerns. The clinic phone number is [phone].  Please show the [place] at check-in to the Emergency Department and triage nurse.  Nivolumab injection What is this medicine? NIVOLUMAB (nye VOL ue mab) is used to treat certain types of melanoma and lung cancer. This medicine may be used for other purposes; ask your health care provider or pharmacist if you have questions. COMMON BRAND NAME(S): Opdivo What should I tell my health care provider before I take this medicine? They need to know if you have any of these conditions: -eye disease, vision problems -history of pancreatitis -immune system problems -inflammatory bowel disease -kidney disease -liver disease -lung disease -lupus -myasthenia gravis -multiple sclerosis -organ transplant -stomach or intestine problems -thyroid disease -tingling of the fingers or toes, or other nerve disorder -an unusual or allergic reaction to nivolumab, other medicines, foods, dyes, or preservatives -pregnant or trying to get  pregnant -breast-feeding How should I use this medicine? This medicine is for infusion into a vein. It is given by a health care professional in a hospital or clinic setting. A special MedGuide will be given to you before each treatment. Be sure to read this information carefully each time. Talk to your pediatrician regarding the use of this medicine in children. Special care may be needed. Overdosage: If you think you've taken too much of this medicine contact a poison control center or emergency room at once. Overdosage: If you think you have taken too much of this medicine contact a poison control center or emergency room at once. NOTE: This medicine is only for you. Do not share this medicine with others. What if I miss a dose? It is important not to miss your dose. Call your doctor or health care professional if you are unable to keep an appointment. What may interact with this medicine? Interactions have not been studied. This list may not describe all possible interactions. Give your health care provider a list of all the medicines, herbs, non-prescription drugs, or dietary supplements you use. Also tell them if you smoke, drink alcohol, or use illegal drugs. Some items may interact with your medicine. What should I watch for while using this medicine? Tell your doctor or healthcare professional if your symptoms do not start to get better or if they get worse. Your condition will be monitored carefully while you are receiving this medicine. You may need blood work done while you are taking this medicine. What side effects may I notice from receiving this medicine? Side effects that you should report to your doctor or health care professional as soon as possible: -allergic reactions  like skin rash, itching or hives, swelling of the face, lips, or tongue -black, tarry stools -bloody or watery diarrhea -changes in vision -chills -cough -depressed mood -eye pain -feeling  anxious -fever -general ill feeling or flu-like symptoms -hair loss -loss of appetite -low blood counts - this medicine may decrease the number of white blood cells, red blood cells and platelets. You may be at increased risk for infections and bleeding -pain, tingling, numbness in the hands or feet -redness, blistering, peeling or loosening of the skin, including inside the mouth -red pinpoint spots on skin -signs of decreased platelets or bleeding - bruising, pinpoint red spots on the skin, black, tarry stools, blood in the urine -signs of decreased red blood cells - unusually weak or tired, feeling faint or lightheaded, falls -signs of infection - fever or chills, cough, sore throat, pain or trouble passing urine -signs and symptoms of a dangerous change in heartbeat or heart rhythm like chest pain; dizziness; fast or irregular heartbeat; palpitations; feeling faint or lightheaded, falls; breathing problems -signs and symptoms of high blood sugar such as dizziness; dry mouth; dry skin; fruity breath; nausea; stomach pain; increased hunger or thirst; increased urination -signs and symptoms of kidney injury like trouble passing urine or change in the amount of urine -signs and symptoms of liver injury like dark yellow or brown urine; general ill feeling or flu-like symptoms; light-colored stools; loss of appetite; nausea; right upper belly pain; unusually weak or tired; yellowing of the eyes or skin -signs and symptoms of increased potassium like muscle weakness; chest pain; or fast, irregular heartbeat -signs and symptoms of low potassium like muscle cramps or muscle pain; chest pain; dizziness; feeling faint or lightheaded, falls; palpitations; breathing problems; or fast, irregular heartbeat -swelling of the ankles, feet, hands -weight gainSide effects that usually do not require medical attention (report to your doctor or health care professional if they continue or are  bothersome): -constipation -general ill feeling or flu-like symptoms -hair loss -loss of appetite -nausea, vomiting This list may not describe all possible side effects. Call your doctor for medical advice about side effects. You may report side effects to FDA at 1-800-FDA-1088. Where should I keep my medicine? This drug is given in a hospital or clinic and will not be stored at home. NOTE: This sheet is a summary. It may not cover all possible information. If you have questions about this medicine, talk to your doctor, pharmacist, or health care provider.  2015, Elsevier/Gold Standard. (2013-08-26 13:18:19)

## 2014-11-18 NOTE — Progress Notes (Signed)
[place] [phone]   [phone]  OFFICE PROGRESS NOTE  No primary care provider on file. No primary provider on file.  DIAGNOSIS:  1) Metastatic non-small cell lung cancer, squamous cell carcinoma initially diagnosed as a stage IIIB diagnosed in March 2015. 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  PRIOR THERAPY: Status post course of concurrent chemoradiation with weekly carboplatin and paclitaxel but the patient did not receive any consolidation chemotherapy.  CURRENT THERAPY: Immunotherapy with Nivolumab 3 MG/KG every 2 weeks. First dose 11/18/2014.  INTERVAL HISTORY: [name] 49 y.o. female returns to the clinic today for follow-up visit accompanied by her brother. She continues to have her baseline shortness of breath as well as pain on the left side of her chest and back. She is currently on fentanyl patch 100 g/hour every 3 days in addition to MS Contin 30 mg by mouth every 8 hours in addition to MSIR 15 mg every 4 hours as needed for severe pain. States that she had been taking her MSIR every 4 hours,  But she has been out of this medication for 4-5 days. States that she has tried to get a refill, but was unable. She had a recent nerve block, but states pain is no better. She is tearful today due to her pain. The patient mentioned that her pain is just under control with this massive doses of pain medication and she does not think she can decrease her dose at this point but she may consider it in the future as tolerated. She was requesting refill of Fentanyl, MS Contin as well as MSIR. She denied having any significant weight loss or night sweats. The patient continues to have left-sided chest pain with shortness of breath and mild cough with no hemoptysis. She has no fever or chills. She is scheduled for CT simulation in Radiation Oncology tomorrow for palliative XRT to her  ribs.  MEDICAL HISTORY: Past Medical History  Diagnosis Date  . Cancer     left bronchioles    ALLERGIES:  is allergic to penicillins.  MEDICATIONS:  Current Outpatient Prescriptions  Medication Sig Dispense Refill  . alum & mag hydroxide-simeth (MAALOX/MYLANTA) 200-200-20 MG/5ML suspension Take 30 mLs by mouth every 4 (four) hours as needed for indigestion or heartburn. 355 mL 0  . docusate sodium (COLACE) 100 MG capsule Take 1 capsule (100 mg total) by mouth 2 (two) times daily. 60 capsule 0  . enoxaparin (LOVENOX) 80 MG/0.8ML injection INJECT 0.8 MLS INTO THE SKIN DAILY 30 Syringe 0  . feeding supplement, RESOURCE BREEZE, (RESOURCE BREEZE) LIQD Take 1 Container by mouth 3 (three) times daily between meals. 90 Container 2  . fentaNYL (DURAGESIC - DOSED MCG/HR) 100 MCG/HR Place 1 patch (100 mcg total) onto the skin every 3 (three) days. 10 patch 0  . ipratropium (ATROVENT) 0.02 % nebulizer solution Take 2.5 mLs (0.5 mg total) by nebulization 3 (three) times daily. 75 mL 0  . LORazepam (ATIVAN) 1 MG tablet Take 1 tablet (1 mg total) by mouth every 6 (six) hours as needed for anxiety. 30 tablet 0  . metoprolol tartrate (LOPRESSOR) 25 MG tablet Take 1 tablet (25 mg total) by mouth 2 (two) times daily. 60 tablet 0  . morphine (MS CONTIN) 15 MG 12 hr tablet Take 1 tablet (15 mg total) by mouth 3 (three) times daily. 90 tablet 0  . morphine (MS CONTIN) 30 MG 12  hr tablet Take 1 tablet (30 mg total) by mouth every 8 (eight) hours. 90 tablet 0  . morphine (MSIR) 15 MG tablet Take 2 tablets (30 mg total) by mouth every 4 (four) hours as needed for severe pain. 90 tablet 0  . nicotine (NICODERM CQ - DOSED IN MG/24 HOURS) 14 mg/24hr patch Place 1 patch (14 mg total) onto the skin daily. 28 patch 0  . omeprazole (PRILOSEC) 20 MG capsule Take 1 capsule (20 mg total) by mouth 2 (two) times daily before a meal. 60 capsule 1  . polyethylene glycol (MIRALAX / GLYCOLAX) packet Take 17 g by mouth 3 (three)  times daily. May decrease to twice daily if you have frequent bowel movements. 120 each 2  . acetaminophen (TYLENOL) 325 MG tablet Take 2 tablets (650 mg total) by mouth every 6 (six) hours as needed for mild pain (or Fever >/= 101). (Patient not taking: Reported on 11/18/2014)    . albuterol (PROVENTIL) (2.5 MG/3ML) 0.083% nebulizer solution 2.5 mg nebulized 3 times a day and every 4hrs as needed for wheezing or shortness of breath. (Patient not taking: Reported on 11/18/2014) 75 mL 12  . ondansetron (ZOFRAN) 4 MG tablet Take 1 tablet (4 mg total) by mouth every 8 (eight) hours as needed for nausea or vomiting. (Patient not taking: Reported on 11/18/2014) 20 tablet 0  . ondansetron (ZOFRAN) 8 MG tablet Take 1 tablet (8 mg total) by mouth every 8 (eight) hours as needed for nausea or vomiting. 20 tablet 0  . senna-docusate (SENOKOT-S) 8.6-50 MG per tablet Take 1 tablet by mouth at bedtime. (Patient not taking: Reported on 11/18/2014) 30 tablet 2   No current facility-administered medications for this visit.    SURGICAL HISTORY:  Past Surgical History  Procedure Laterality Date  . Esophagogastroduodenoscopy N/A 09/14/2014    Procedure: ESOPHAGOGASTRODUODENOSCOPY (EGD);  Surgeon: Teena Irani, MD;  Location: [facility];  Service: Endoscopy;  Laterality: N/A;    REVIEW OF SYSTEMS:  Constitutional: positive for anorexia and fatigue Eyes: negative Ears, nose, mouth, throat, and face: negative Respiratory: positive for cough and dyspnea on exertion Cardiovascular: negative Gastrointestinal: negative Genitourinary:negative Integument/breast: negative Hematologic/lymphatic: negative Musculoskeletal:positive for back pain and bone pain Neurological: negative Behavioral/Psych: negative Endocrine: negative Allergic/Immunologic: negative   PHYSICAL EXAMINATION: General appearance: alert, cooperative, fatigued, no distress and tearful Head: Normocephalic, without obvious abnormality, atraumatic Neck:  no adenopathy, no JVD, supple, symmetrical, trachea midline and thyroid not enlarged, symmetric, no tenderness/mass/nodules Lymph nodes: Cervical, supraclavicular, and axillary nodes normal. Resp: wheezes bilaterally Back: symmetric, no curvature. ROM normal. No CVA tenderness. Cardio: regular rate and rhythm, S1, S2 normal, no murmur, click, rub or gallop GI: soft, non-tender; bowel sounds normal; no masses,  no organomegaly Extremities: extremities normal, atraumatic, no cyanosis or edema Neurologic: Alert and oriented X 3, normal strength and tone. Normal symmetric reflexes. Normal coordination and gait  ECOG PERFORMANCE STATUS: 2 - Symptomatic, <50% confined to bed  Blood pressure 102/71, pulse 85, temperature 98.5 F (36.9 C), temperature source Oral, resp. rate 18, height '5\' 7"'$  (1.702 m), weight 130 lb 3.2 oz (59.058 kg), last menstrual period 10/15/2014, SpO2 98 %.  LABORATORY DATA: Lab Results  Component Value Date   WBC 16.7* 11/18/2014   HGB 10.7* 11/18/2014   HCT 32.7* 11/18/2014   MCV 81.8 11/18/2014   PLT 607* 11/18/2014      Chemistry      Component Value Date/Time   NA 130* 11/18/2014 1044   NA 131* 09/29/2014 0540  K 4.4 11/18/2014 1044   K 5.1 09/29/2014 0540   CL 102 09/29/2014 0540   CO2 24 11/18/2014 1044   CO2 22 09/29/2014 0540   BUN 10.0 11/18/2014 1044   BUN 21 09/29/2014 0540   CREATININE 0.6 11/18/2014 1044   CREATININE 0.56 09/29/2014 0540      Component Value Date/Time   CALCIUM 9.0 11/18/2014 1044   CALCIUM 9.5 09/29/2014 0540   ALKPHOS 91 11/18/2014 1044   ALKPHOS 81 09/27/2014 0550   AST 10 11/18/2014 1044   AST 14 09/27/2014 0550   ALT <6 11/18/2014 1044   ALT 6 09/27/2014 0550   BILITOT 0.38 11/18/2014 1044   BILITOT 0.7 09/27/2014 0550       RADIOGRAPHIC STUDIES: Mr Jeri Cos Wo Contrast  11-10-2014   CLINICAL DATA:  History of squamous cell lung cancer.  Staging.  EXAM: MRI HEAD WITHOUT AND WITH CONTRAST  TECHNIQUE: Multiplanar,  multiecho pulse sequences of the brain and surrounding structures were obtained without and with intravenous contrast.  CONTRAST:  69m MULTIHANCE GADOBENATE DIMEGLUMINE 529 MG/ML IV SOLN  COMPARISON:  None.  FINDINGS: No evidence for acute infarction, hemorrhage, mass lesion, hydrocephalus, or extra-axial fluid. Premature for age cerebral and cerebellar atrophy. Minor white matter disease.  Post infusion, no abnormal enhancement of the brain or meninges. Pituitary, pineal, and cerebellar tonsils unremarkable. No upper cervical lesions. Flow voids are maintained throughout the carotid, basilar, and vertebral arteries. There are no areas of chronic hemorrhage.  Visualized calvarium, skull base, and upper cervical osseous structures unremarkable. Scalp and extracranial soft tissues, orbits, sinuses, and mastoids show no acute process.  IMPRESSION: Chronic changes as described.  No evidence for intracranial metastatic disease on this 1.5 tesla scanner.   Electronically Signed   By: JRolla FlattenM.D.   On: 005/23/1617:16   Nm Pet Image Restag (ps) Skull Base To Thigh  505/23/16  CLINICAL DATA:  Initial encounter for treatment strategy for staging for squamous cell carcinoma.  EXAM: NUCLEAR MEDICINE PET SKULL BASE TO THIGH  TECHNIQUE: 6.6 mCi F-18 FDG was injected intravenously. Full-ring PET imaging was performed from the skull base to thigh after the radiotracer. CT data was obtained and used for attenuation correction and anatomic localization.  FASTING BLOOD GLUCOSE:  Value: 85 mg/dl  COMPARISON:  Abdominal pelvic CT of 09/28/2014. Most recent chest CT of 09/18/2014.  FINDINGS: NECK  No areas of abnormal hypermetabolism.  CHEST  Left internal mammary nodal metastasis, measuring 6 mm and a S.U.V. max of 2.8 on image 66.  Extensive left-sided pleural and chest wall metastasis. An index posterior medial component is bilobed, measuring 3.2 x 1.8 cm and a S.U.V. max of 8.6 on image 65 of series 4. This is similar to  on the prior CT.  Anterior pleural-based tumor extends in the left-sided mediastinum, including on image 68 of series 4. This is similar to on the prior.  A left pleural on intercostal mass between the seventh and eighth ribs measures 2.5 cm thickness and a S.U.V. max of 11.7. This is enlarged from 1.7 cm thickness on 09/18/2014 and 1.7 cm thickness on 09/28/2014.  Left pleural mass with adjacent ninth rib involvement measures 2.2 cm and a S.U.V. max of 11.7 on image 90. 1.7 cm on 09/28/2014.  Dilated esophagus with fluid within. Concurrent mid esophageal hypermetabolism, measuring a S.U.V. max of 7.0. Example image 74 of series 4.  ABDOMEN/PELVIS  No areas of abnormal hypermetabolism.  SKELETON  No other osseous  hypermetabolism identified (other than involvement of the left-sided rib as detailed above.)  CT IMAGES PERFORMED FOR ATTENUATION CORRECTION  No cervical adenopathy.  Left pleural effusion is resolved. The right pleural effusion is nearly resolved. There is minimal loculated fluid identified in the inferior right pleural space. Advanced centrilobular emphysema. Improved pulmonary aeration bilaterally. Mild left lower lobe scarring remains.  Colonic stool burden suggests constipation. Markedly age advanced aortic and branch vessel atherosclerosis.  IMPRESSION: 1. Extensive left pleural and chest wall metastasis. Findings are progressive since 09/28/2014. 2. Medial left pleural tumor extends directly into the left-sided mediastinum. 3. Isolated left internal mammary nodal metastasis. 4. No extrathoracic metastatic disease identified. 5. Resolved left and nearly resolved right pleural effusion. Minimal residual loculated right-sided pleural fluid remains. Improved pulmonary aeration with centrilobular emphysema and scarring. 6. Advanced atherosclerosis. 7. Esophageal air fluid level suggests dysmotility or gastroesophageal reflux. Hypermetabolism within the mid esophagus is favored to be physiologic or  possibly related to esophagitis.   Electronically Signed   By: Abigail Miyamoto M.D.   On: 10/22/2014 16:17    ASSESSMENT AND PLAN: This is a very pleasant 49 year old white female with metastatic non-small cell lung cancer, adenocarcinoma initially diagnosed as a stage IIIB status post course of concurrent chemoradiation. The recent MRI of the brain showed no evidence for metastatic disease to the brain but the PET scan showed extensive left pleural and chest wall metastasis with mild left pleural tumor extending directly into the left sided mediastinum as well as isolated left internal mammary nodal metastasis. There was no evidence for extrathoracic metastatic disease.  Patient seen and examined with Dr. Julien Nordmann. She will proceed with cycle 1 of immunotherapy with Nivolumab 3 MG/KG every 2 weeks today. Adverse effect of the immunotherapy were again reviewed including on mediated skin rash, diarrhea, liver, renal, endocrine dysfunction. She was instructed to stop the Dexamethasone.  For pain management she will continue with the current pain medication. I have refilled her Fentanyl, MSIR, and MS Contin today. I strongly advised the patient to start weaning herself slowly from this massive doses of narcotics. Hopefully with XRT, the patient can begin to wean off of her pain medications.  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 I have refilled this today.  She is scheduled to establish with a PCP on 6/14.The patient inquired about continuing her Metoprolol and I will defer this to her PCP.  The patient will come back for follow-up visit in 2 weeks prior to cycle 2 of Nivolumab. She was advised to call immediately if she has any concerning symptoms in the interval.  The patient voices understanding of current disease status and treatment options and is in agreement with the current care plan.  All questions were answered. The  patient knows to call the clinic with any problems, questions or concerns. We can certainly see the patient much sooner if necessary.  Mikey Bussing, DNP, AGPCNP-BC, AOCNP  ADDENDUM: Hematology/Oncology Attending: I had a face to face encounter with the patient. I recommended her care plan. This is a very pleasant 49 years old white female with metastatic non-small cell lung cancer, squamous cell carcinoma who was previously treated with a course of concurrent chemoradiation in [place] but has evidence for disease progression. The patient is here today to start the first cycle of her immunotherapy with Nivolumab. She continues to complain of pain all over her body more on the left side of  the chest secondary to disease progression. She has a lot of other medical issues and has not established care with a primary care physician yet. She is currently on several pain medications including fentanyl patch, MS Contin and MSIR. She was given a refill of MSIR today. The patient will also continue on Lovenox for the superior vena cava thrombosis. She would come back for follow-up visit in 2 weeks for reevaluation before starting cycle #2 of her treatment. The patient was advised to call immediately if she has any concerning symptoms in the interval.   Disclaimer: This note was dictated with voice recognition software. Similar sounding words can inadvertently be transcribed and may be missed upon review.  Eilleen Kempf., MD 11/24/2014

## 2014-11-18 NOTE — Telephone Encounter (Signed)
Patient called to state she was out of pain medication morphine and fentynl patch.States she called last week 2 or 3 times and left message regarding this.I had no messages regarding this and informed patient I had called her twice last week to check on status and schedule for ct simulation.Fowarded call to sim so she could be scheduled and informed her of appointments today in medical oncology.She will ask for refill with Mikey Bussing NP but  knows to come down or call me if I need to ask Dr.Wentworth.

## 2014-11-19 ENCOUNTER — Ambulatory Visit
Admission: RE | Admit: 2014-11-19 | Discharge: 2014-11-19 | Disposition: A | Discharge status: Home / Self Care | Payer: 59 | Source: Ambulatory Visit | Attending: Radiation Oncology | Admitting: Radiation Oncology

## 2014-11-19 DIAGNOSIS — C3490 Malignant neoplasm of unspecified part of unspecified bronchus or lung: Secondary | ICD-10-CM

## 2014-11-19 DIAGNOSIS — Z510 Encounter for antineoplastic radiation therapy: Secondary | ICD-10-CM | POA: Diagnosis not present

## 2014-11-19 NOTE — Progress Notes (Signed)
Name: [name]   MRN: [id number]  Date:  11/19/2014  DOB: [birth year]  Status:Outpatient    DIAGNOSIS: Metastatic cancer to spine  CONSENT VERIFIED: yes   SET UP: Patient is setup supine   IMMOBILIZATION:  The following immobilization was used: Alpha cradle  NARRATIVE:  [name] was brought to the CT Energy manager.  Identity was confirmed.  All relevant records and images related to the planned course of therapy were reviewed.  Then, the patient was positioned in a stable reproducible clinical set-up for radiation therapy.  CT images were obtained.  An isocenter was placed. Skin markings were placed.  The CT images were loaded into the planning software where the target and avoidance structures were contoured.  The radiation prescription was entered and confirmed. The patient was discharged in stable condition and tolerated simulation well.    TREATMENT PLANNING NOTE:  Treatment planning then occurred. I have requested : MLC's, isodose plan, basic dose calculation  I have requested 3 dimensional simulation with DVH of lung, heart and gtv  A total of 3 complex treatment devices will be used in the form of unique MLCS

## 2014-11-20 ENCOUNTER — Telehealth: Payer: Self-pay | Admitting: Internal Medicine

## 2014-11-20 ENCOUNTER — Telehealth: Payer: Self-pay | Admitting: Medical Oncology

## 2014-11-20 DIAGNOSIS — Z510 Encounter for antineoplastic radiation therapy: Secondary | ICD-10-CM | POA: Diagnosis not present

## 2014-11-20 NOTE — Telephone Encounter (Signed)
Lft msg for pt confirming labs/ov per 06/01 POF, mailed schedule to pt.... KJ

## 2014-11-20 NOTE — Telephone Encounter (Signed)
Received Nivolumab 2 days ago . " I feel a little off today -I have eaten today , having my normal pain , I don't feel like going downstairs , I am just laying in bed.  I feel like when I am getting the flu". I told her to drink extra fluids and rest and call back tomorrow if not feeling any better.

## 2014-11-21 DIAGNOSIS — Z510 Encounter for antineoplastic radiation therapy: Secondary | ICD-10-CM | POA: Diagnosis not present

## 2014-11-24 ENCOUNTER — Ambulatory Visit
Admission: RE | Admit: 2014-11-24 | Discharge: 2014-11-24 | Disposition: A | Discharge status: Home / Self Care | Payer: 59 | Source: Ambulatory Visit | Attending: Radiation Oncology | Admitting: Radiation Oncology

## 2014-11-24 DIAGNOSIS — Z510 Encounter for antineoplastic radiation therapy: Secondary | ICD-10-CM | POA: Diagnosis not present

## 2014-11-24 DIAGNOSIS — Z5112 Encounter for antineoplastic immunotherapy: Secondary | ICD-10-CM | POA: Insufficient documentation

## 2014-11-25 ENCOUNTER — Ambulatory Visit
Admission: RE | Admit: 2014-11-25 | Discharge: 2014-11-25 | Disposition: A | Discharge status: Home / Self Care | Payer: 59 | Source: Ambulatory Visit | Attending: Radiation Oncology | Admitting: Radiation Oncology

## 2014-11-25 VITALS — BP 84/52 | HR 87 | Temp 97.9°F

## 2014-11-25 DIAGNOSIS — Z510 Encounter for antineoplastic radiation therapy: Secondary | ICD-10-CM | POA: Diagnosis not present

## 2014-11-25 DIAGNOSIS — C3490 Malignant neoplasm of unspecified part of unspecified bronchus or lung: Secondary | ICD-10-CM | POA: Insufficient documentation

## 2014-11-25 MED ORDER — BIAFINE EX EMUL
CUTANEOUS | Status: AC
  Administered 2014-11-25: 11:00:00 via TOPICAL

## 2014-11-25 NOTE — Progress Notes (Signed)
Weekly Management Note Current Dose: 6  Gy  Projected Dose: 30 Gy   Narrative:  The patient presents for routine under treatment assessment.  CBCT/MVCT images/Port film x-rays were reviewed.  The chart was checked. Doing well. No complaints. Pain controlled. BP low  Physical Findings: Weight:  . Unchanged  Impression:  The patient is tolerating radiation.  Plan:  Continue treatment as planned. Stop BP med. Continue current pain medications.

## 2014-11-26 ENCOUNTER — Other Ambulatory Visit: Payer: Self-pay | Admitting: Oncology

## 2014-11-26 ENCOUNTER — Ambulatory Visit
Admission: RE | Admit: 2014-11-26 | Discharge: 2014-11-26 | Disposition: A | Discharge status: Home / Self Care | Payer: 59 | Source: Ambulatory Visit | Attending: Radiation Oncology | Admitting: Radiation Oncology

## 2014-11-26 DIAGNOSIS — Z510 Encounter for antineoplastic radiation therapy: Secondary | ICD-10-CM | POA: Diagnosis not present

## 2014-11-27 ENCOUNTER — Ambulatory Visit
Admission: RE | Admit: 2014-11-27 | Discharge: 2014-11-27 | Disposition: A | Discharge status: Home / Self Care | Payer: 59 | Source: Ambulatory Visit | Attending: Radiation Oncology | Admitting: Radiation Oncology

## 2014-11-27 DIAGNOSIS — Z510 Encounter for antineoplastic radiation therapy: Secondary | ICD-10-CM | POA: Diagnosis not present

## 2014-11-28 ENCOUNTER — Ambulatory Visit
Admission: RE | Admit: 2014-11-28 | Discharge: 2014-11-28 | Disposition: A | Discharge status: Home / Self Care | Payer: 59 | Source: Ambulatory Visit | Attending: Radiation Oncology | Admitting: Radiation Oncology

## 2014-11-28 DIAGNOSIS — Z510 Encounter for antineoplastic radiation therapy: Secondary | ICD-10-CM | POA: Diagnosis not present

## 2014-12-01 ENCOUNTER — Other Ambulatory Visit: Payer: 59

## 2014-12-01 ENCOUNTER — Encounter: Payer: Self-pay | Admitting: Physician Assistant

## 2014-12-01 ENCOUNTER — Ambulatory Visit
Admission: RE | Admit: 2014-12-01 | Discharge: 2014-12-01 | Disposition: A | Discharge status: Home / Self Care | Payer: 59 | Source: Ambulatory Visit | Attending: Radiation Oncology | Admitting: Radiation Oncology

## 2014-12-01 ENCOUNTER — Other Ambulatory Visit (HOSPITAL_BASED_OUTPATIENT_CLINIC_OR_DEPARTMENT_OTHER): Payer: 59

## 2014-12-01 ENCOUNTER — Ambulatory Visit: Payer: 59 | Admitting: Physician Assistant

## 2014-12-01 ENCOUNTER — Other Ambulatory Visit: Payer: Self-pay | Admitting: Medical Oncology

## 2014-12-01 ENCOUNTER — Telehealth: Payer: Self-pay | Admitting: Internal Medicine

## 2014-12-01 ENCOUNTER — Ambulatory Visit (HOSPITAL_BASED_OUTPATIENT_CLINIC_OR_DEPARTMENT_OTHER): Payer: 59 | Admitting: Physician Assistant

## 2014-12-01 ENCOUNTER — Telehealth: Payer: Self-pay | Admitting: *Deleted

## 2014-12-01 VITALS — BP 96/68 | HR 98 | Temp 98.3°F | Resp 18 | Ht 67.0 in | Wt 125.3 lb

## 2014-12-01 DIAGNOSIS — R0602 Shortness of breath: Secondary | ICD-10-CM | POA: Diagnosis not present

## 2014-12-01 DIAGNOSIS — C3492 Malignant neoplasm of unspecified part of left bronchus or lung: Secondary | ICD-10-CM | POA: Diagnosis not present

## 2014-12-01 DIAGNOSIS — R52 Pain, unspecified: Secondary | ICD-10-CM

## 2014-12-01 DIAGNOSIS — Z510 Encounter for antineoplastic radiation therapy: Secondary | ICD-10-CM | POA: Diagnosis not present

## 2014-12-01 DIAGNOSIS — Z86718 Personal history of other venous thrombosis and embolism: Secondary | ICD-10-CM

## 2014-12-01 DIAGNOSIS — Z79899 Other long term (current) drug therapy: Secondary | ICD-10-CM

## 2014-12-01 DIAGNOSIS — C3490 Malignant neoplasm of unspecified part of unspecified bronchus or lung: Secondary | ICD-10-CM

## 2014-12-01 LAB — CBC WITH DIFFERENTIAL/PLATELET
BASO%: 0.5 % (ref 0.0–2.0)
Basophils Absolute: 0 10*3/uL (ref 0.0–0.1)
EOS%: 1.4 % (ref 0.0–7.0)
Eosinophils Absolute: 0.1 10*3/uL (ref 0.0–0.5)
HCT: 31.9 % — ABNORMAL LOW (ref 34.8–46.6)
HGB: 10.3 g/dL — ABNORMAL LOW (ref 11.6–15.9)
LYMPH#: 0.2 10*3/uL — AB (ref 0.9–3.3)
LYMPH%: 3.3 % — ABNORMAL LOW (ref 14.0–49.7)
MCH: 25.8 pg (ref 25.1–34.0)
MCHC: 32.4 g/dL (ref 31.5–36.0)
MCV: 79.6 fL (ref 79.5–101.0)
MONO#: 0.5 10*3/uL (ref 0.1–0.9)
MONO%: 8.3 % (ref 0.0–14.0)
NEUT#: 4.8 10*3/uL (ref 1.5–6.5)
NEUT%: 86.5 % — ABNORMAL HIGH (ref 38.4–76.8)
Platelets: 538 10*3/uL — ABNORMAL HIGH (ref 145–400)
RBC: 4.01 10*6/uL (ref 3.70–5.45)
RDW: 18.3 % — ABNORMAL HIGH (ref 11.2–14.5)
WBC: 5.5 10*3/uL (ref 3.9–10.3)

## 2014-12-01 LAB — COMPREHENSIVE METABOLIC PANEL (CC13)
ALK PHOS: 110 U/L (ref 40–150)
AST: 10 U/L (ref 5–34)
Albumin: 2.4 g/dL — ABNORMAL LOW (ref 3.5–5.0)
Anion Gap: 10 mEq/L (ref 3–11)
BILIRUBIN TOTAL: 0.55 mg/dL (ref 0.20–1.20)
BUN: 5.3 mg/dL — ABNORMAL LOW (ref 7.0–26.0)
CO2: 27 mEq/L (ref 22–29)
Calcium: 8.8 mg/dL (ref 8.4–10.4)
Chloride: 92 mEq/L — ABNORMAL LOW (ref 98–109)
Creatinine: 0.5 mg/dL — ABNORMAL LOW (ref 0.6–1.1)
EGFR: >90 mL/min/{1.73_m2} (ref >90)
Glucose: 99 mg/dl (ref 70–140)
Potassium: 3.2 mEq/L — ABNORMAL LOW (ref 3.5–5.1)
SODIUM: 129 meq/L — AB (ref 136–145)
TOTAL PROTEIN: 5.9 g/dL — AB (ref 6.4–8.3)

## 2014-12-01 MED ORDER — METHYLPREDNISOLONE 4 MG PO TBPK: ORAL_TABLET | ORAL | Status: DC

## 2014-12-01 MED ORDER — MORPHINE SULFATE 15 MG PO TABS: 30.0000 mg | ORAL_TABLET | ORAL | Status: DC | PRN

## 2014-12-01 NOTE — Progress Notes (Addendum)
[place] [phone]   [phone]  OFFICE PROGRESS NOTE  No primary care provider on file. No primary provider on file.  DIAGNOSIS:  1) Metastatic non-small cell lung cancer, squamous cell carcinoma initially diagnosed as a stage IIIB diagnosed in March 2015. 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  PRIOR THERAPY: Status post course of concurrent chemoradiation with weekly carboplatin and paclitaxel but the patient did not receive any consolidation chemotherapy.  CURRENT THERAPY: Immunotherapy with Nivolumab 3 MG/KG every 2 weeks. First dose 11/18/2014. Status  INTERVAL HISTORY: [name] 49 y.o. female returns to the clinic today for follow-up visit accompanied by her brother. She continues to have her baseline shortness of breath as well as pain on the left side of her chest and back. She is currently on fentanyl patch 100 g/hour every 3 days in addition to MS Contin 30 mg by mouth every 8 hours in addition to MSIR 15 mg every 4 hours as needed for severe pain. States that she had been taking her MSIR every 4 hours. She requests a refill for her MSIR. Her brother had her hold her lorazepam that she was also taking fairly regularly and noted that without the lorazepam on a regular basis she was much more lucid. She is only taking the lorazepam now at bedtime.  She had a recent nerve block, but states pain is no better.  The patient mentioned that her pain is just under control with this massive doses of pain medication and she does not think she can decrease her dose at this point but she may consider it in the future as tolerated. She is currently undergoing palliative radiation therapy to her ribs however, still is not experiencing any improvement in her pain management. She denied having any night sweats. The patient continues to have left-sided chest pain with shortness of breath and  mild cough with no hemoptysis. She has had some weight loss. She has no fever or chills.   MEDICAL HISTORY: Past Medical History  Diagnosis Date  . Cancer     left bronchioles    ALLERGIES:  is allergic to penicillins.  MEDICATIONS:  Current Outpatient Prescriptions  Medication Sig Dispense Refill  . acetaminophen (TYLENOL) 325 MG tablet Take 2 tablets (650 mg total) by mouth every 6 (six) hours as needed for mild pain (or Fever >/= 101).    Marland Kitchen albuterol (PROVENTIL) (2.5 MG/3ML) 0.083% nebulizer solution 2.5 mg nebulized 3 times a day and every 4hrs as needed for wheezing or shortness of breath. 75 mL 12  . alum & mag hydroxide-simeth (MAALOX/MYLANTA) 200-200-20 MG/5ML suspension Take 30 mLs by mouth every 4 (four) hours as needed for indigestion or heartburn. 355 mL 0  . docusate sodium (COLACE) 100 MG capsule Take 1 capsule (100 mg total) by mouth 2 (two) times daily. 60 capsule 0  . emollient (BIAFINE) cream Apply topically as needed.    . enoxaparin (LOVENOX) 80 MG/0.8ML injection INJECT 0.8 MLS INTO THE SKIN DAILY 30 Syringe 0  . feeding supplement, RESOURCE BREEZE, (RESOURCE BREEZE) LIQD Take 1 Container by mouth 3 (three) times daily between meals. 90 Container 2  . fentaNYL (DURAGESIC - DOSED MCG/HR) 100 MCG/HR Place 1 patch (100 mcg total) onto the skin every 3 (three) days. 10 patch 0  . ipratropium (ATROVENT) 0.02 % nebulizer solution Take 2.5 mLs (0.5 mg total) by nebulization 3 (three) times daily. 75 mL  0  . LORazepam (ATIVAN) 1 MG tablet Take 1 tablet (1 mg total) by mouth every 6 (six) hours as needed for anxiety. 30 tablet 0  . morphine (MS CONTIN) 15 MG 12 hr tablet Take 1 tablet (15 mg total) by mouth 3 (three) times daily. 90 tablet 0  . morphine (MS CONTIN) 30 MG 12 hr tablet Take 1 tablet (30 mg total) by mouth every 8 (eight) hours. 90 tablet 0  . morphine (MSIR) 15 MG tablet Take 2 tablets (30 mg total) by mouth every 4 (four) hours as needed for severe pain. 90  tablet 0  . nicotine (NICODERM CQ - DOSED IN MG/24 HOURS) 14 mg/24hr patch Place 1 patch (14 mg total) onto the skin daily. 28 patch 0  . omeprazole (PRILOSEC) 20 MG capsule Take 1 capsule (20 mg total) by mouth 2 (two) times daily before a meal. 60 capsule 1  . ondansetron (ZOFRAN) 4 MG tablet Take 1 tablet (4 mg total) by mouth every 8 (eight) hours as needed for nausea or vomiting. 20 tablet 0  . ondansetron (ZOFRAN) 8 MG tablet TAKE 1 TABLET(8 MG) BY MOUTH EVERY 8 HOURS AS NEEDED FOR NAUSEA OR VOMITING 20 tablet 0  . polyethylene glycol (MIRALAX / GLYCOLAX) packet Take 17 g by mouth 3 (three) times daily. May decrease to twice daily if you have frequent bowel movements. 120 each 2  . senna-docusate (SENOKOT-S) 8.6-50 MG per tablet Take 1 tablet by mouth at bedtime. 30 tablet 2  . methylPREDNISolone (MEDROL DOSEPAK) 4 MG TBPK tablet TAKE AS DIRECTED WITH FOOD 21 tablet 0  . metoprolol tartrate (LOPRESSOR) 25 MG tablet Take 1 tablet (25 mg total) by mouth 2 (two) times daily. (Patient not taking: Reported on 12/01/2014) 60 tablet 0   No current facility-administered medications for this visit.    SURGICAL HISTORY:  Past Surgical History  Procedure Laterality Date  . Esophagogastroduodenoscopy N/A 09/14/2014    Procedure: ESOPHAGOGASTRODUODENOSCOPY (EGD);  Surgeon: Teena Irani, MD;  Location: [facility];  Service: Endoscopy;  Laterality: N/A;    REVIEW OF SYSTEMS:  Constitutional: positive for anorexia, fatigue and weight loss Eyes: negative Ears, nose, mouth, throat, and face: negative Respiratory: positive for cough and dyspnea on exertion Cardiovascular: negative Gastrointestinal: negative Genitourinary:negative Integument/breast: negative Hematologic/lymphatic: negative Musculoskeletal:positive for back pain and bone pain Neurological: negative Behavioral/Psych: negative Endocrine: negative Allergic/Immunologic: negative   PHYSICAL EXAMINATION: General appearance: alert,  cooperative, fatigued, no distress and tearful Head: Normocephalic, without obvious abnormality, atraumatic Neck: no adenopathy, no JVD, supple, symmetrical, trachea midline and thyroid not enlarged, symmetric, no tenderness/mass/nodules Lymph nodes: Cervical, supraclavicular, and axillary nodes normal. Resp: wheezes bilaterally Back: symmetric, no curvature. ROM normal. No CVA tenderness. Cardio: regular rate and rhythm, S1, S2 normal, no murmur, click, rub or gallop GI: soft, non-tender; bowel sounds normal; no masses,  no organomegaly Extremities: extremities normal, atraumatic, no cyanosis or edema Neurologic: Alert and oriented X 3, normal strength and tone. Normal symmetric reflexes. Normal coordination and gait  ECOG PERFORMANCE STATUS: 2 - Symptomatic, <50% confined to bed  Blood pressure 96/68, pulse 98, temperature 98.3 F (36.8 C), temperature source Oral, resp. rate 18, height '5\' 7"'$  (1.702 m), weight 125 lb 4.8 oz (56.836 kg), SpO2 94 %.  LABORATORY DATA: Lab Results  Component Value Date   WBC 5.5 12/01/2014   HGB 10.3* 12/01/2014   HCT 31.9* 12/01/2014   MCV 79.6 12/01/2014   PLT 538* 12/01/2014      Chemistry  Component Value Date/Time   NA 129* 12/01/2014 1120   NA 131* 09/29/2014 0540   K 3.2* 12/01/2014 1120   K 5.1 09/29/2014 0540   CL 102 09/29/2014 0540   CO2 27 12/01/2014 1120   CO2 22 09/29/2014 0540   BUN 5.3* 12/01/2014 1120   BUN 21 09/29/2014 0540   CREATININE 0.5* 12/01/2014 1120   CREATININE 0.56 09/29/2014 0540      Component Value Date/Time   CALCIUM 8.8 12/01/2014 1120   CALCIUM 9.5 09/29/2014 0540   ALKPHOS 110 12/01/2014 1120   ALKPHOS 81 09/27/2014 0550   AST 10 12/01/2014 1120   AST 14 09/27/2014 0550   ALT <6 12/01/2014 1120   ALT 6 09/27/2014 0550   BILITOT 0.55 12/01/2014 1120   BILITOT 0.7 09/27/2014 0550       RADIOGRAPHIC STUDIES: No results found.  ASSESSMENT AND PLAN: This is a very pleasant 49 year old white  female with metastatic non-small cell lung cancer, adenocarcinoma initially diagnosed as a stage IIIB status post course of concurrent chemoradiation. The recent MRI of the brain showed no evidence for metastatic disease to the brain but the PET scan showed extensive left pleural and chest wall metastasis with mild left pleural tumor extending directly into the left sided mediastinum as well as isolated left internal mammary nodal metastasis. There was no evidence for extrathoracic metastatic disease.  Patient seen and examined with Dr. Julien Nordmann. She will proceed with cycle 2 of immunotherapy with Nivolumab 3 MG/KG every 2 weeks as scheduled.  For pain management she will continue with the current pain medication. I have refilled her  MSIR today. I strongly advised the patient to start weaning herself slowly from these massive doses of narcotics. Hopefully with XRT, the patient can begin to wean off of her pain medications. Continue lorazepam at bedtime only. For appetite stimulation the patient be placed on a Medrol Dosepak. The patient was discussed with and also seen by Dr. Julien Nordmann. We feel that she would benefit from a home palliative care consult and we will proceed to make the home based palliative care consult through the home based palliative care consult service.  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  She is scheduled to establish with a PCP on 6/14.she is advised to keep this appointment.  The patient will come back for a follow-up visit in 2 weeks prior to cycle 3 of Nivolumab. She was advised to call immediately if she has any concerning symptoms in the interval.  The patient voices understanding of current disease status and treatment options and is in agreement with the current care plan.  All questions were answered. The patient knows to call the clinic with any problems, questions or concerns. We can certainly  see the patient much sooner if necessary.  Carlton Adam, PA-C 12/01/2014  ADDENDUM: Hematology/Oncology Attending: I had a face to face encounter with the patient. I recommended her care plan. This is a very pleasant 49 years old white female with metastatic non-small cell lung cancer, squamous cell carcinoma who recently completed a course of palliative radiotherapy to the pleural based metastasis and currently undergoing immunotherapy with Nivolumab status post 1 cycle. She tolerated the first cycle of her treatment fairly well with no significant adverse effects. Her main issues continues to be the baseline shortness of breath as well as the severe pain. She requires significant amount of pain medication. We gave her  refill of her pain medication. We will also arrange for the patient to have a home palliative care consult for evaluation of her condition and management of her pain issues. I recommended for the patient to proceed with her treatment with Nivolumab as a scheduled. She would come back for follow-up visit in 2 weeks for reevaluation before starting the next cycle of her treatment. For the history of superior vena cava thrombosis, the patient will continue on Lovenox 80 mg subcutaneously on daily basis. She was advised to call immediately if she has any concerning symptoms in the interval.  Disclaimer: This note was dictated with voice recognition software. Similar sounding words can inadvertently be transcribed and may not be corrected upon review. Eilleen Kempf., MD 12/03/2014

## 2014-12-01 NOTE — Progress Notes (Signed)
Quick Note:  Call patient with the result and order K Dur 20 meq po qd x7 days ______ 

## 2014-12-01 NOTE — Telephone Encounter (Signed)
PRESCRIPTION E-SCRIBED TO PHARMACY.

## 2014-12-01 NOTE — Telephone Encounter (Signed)
Gave and printed appt sched and avs for pt for June and July  °

## 2014-12-02 ENCOUNTER — Other Ambulatory Visit: Payer: Self-pay | Admitting: Internal Medicine

## 2014-12-02 ENCOUNTER — Other Ambulatory Visit: Payer: Self-pay | Admitting: Medical Oncology

## 2014-12-02 ENCOUNTER — Telehealth: Payer: Self-pay | Admitting: Medical Oncology

## 2014-12-02 ENCOUNTER — Ambulatory Visit (HOSPITAL_BASED_OUTPATIENT_CLINIC_OR_DEPARTMENT_OTHER): Payer: 59

## 2014-12-02 ENCOUNTER — Ambulatory Visit
Admission: RE | Admit: 2014-12-02 | Discharge: 2014-12-02 | Disposition: A | Discharge status: Home / Self Care | Payer: 59 | Source: Ambulatory Visit | Attending: Radiation Oncology | Admitting: Radiation Oncology

## 2014-12-02 VITALS — BP 93/60 | Temp 98.0°F

## 2014-12-02 VITALS — BP 109/61 | HR 69 | Temp 98.5°F | Resp 18

## 2014-12-02 DIAGNOSIS — C782 Secondary malignant neoplasm of pleura: Secondary | ICD-10-CM | POA: Diagnosis not present

## 2014-12-02 DIAGNOSIS — Z510 Encounter for antineoplastic radiation therapy: Secondary | ICD-10-CM | POA: Diagnosis not present

## 2014-12-02 DIAGNOSIS — C3492 Malignant neoplasm of unspecified part of left bronchus or lung: Secondary | ICD-10-CM | POA: Diagnosis not present

## 2014-12-02 DIAGNOSIS — C3490 Malignant neoplasm of unspecified part of unspecified bronchus or lung: Secondary | ICD-10-CM

## 2014-12-02 DIAGNOSIS — C7989 Secondary malignant neoplasm of other specified sites: Secondary | ICD-10-CM

## 2014-12-02 DIAGNOSIS — E876 Hypokalemia: Secondary | ICD-10-CM

## 2014-12-02 DIAGNOSIS — Z5112 Encounter for antineoplastic immunotherapy: Secondary | ICD-10-CM | POA: Diagnosis not present

## 2014-12-02 MED ORDER — SODIUM CHLORIDE 0.9 % IV SOLN
180.0000 mg | Freq: Once | INTRAVENOUS | Status: AC
  Administered 2014-12-02: 180 mg via INTRAVENOUS
  Filled 2014-12-02: qty 18

## 2014-12-02 MED ORDER — SODIUM CHLORIDE 0.9 % IV SOLN
Freq: Once | INTRAVENOUS | Status: AC
  Administered 2014-12-02: 12:00:00 via INTRAVENOUS

## 2014-12-02 MED ORDER — POTASSIUM CHLORIDE CRYS ER 20 MEQ PO TBCR: 20.0000 meq | EXTENDED_RELEASE_TABLET | Freq: Every day | ORAL | Status: DC

## 2014-12-02 NOTE — Patient Instructions (Signed)
[facility] Discharge Instructions for Patients Receiving Chemotherapy  Today you received the following chemotherapy agents Nivolumab  To help prevent nausea and vomiting after your treatment, we encourage you to take your nausea medication     If you develop nausea and vomiting that is not controlled by your nausea medication, call the clinic.   BELOW ARE SYMPTOMS THAT SHOULD BE REPORTED IMMEDIATELY:  *FEVER GREATER THAN 100.5 F  *CHILLS WITH OR WITHOUT FEVER  NAUSEA AND VOMITING THAT IS NOT CONTROLLED WITH YOUR NAUSEA MEDICATION  *UNUSUAL SHORTNESS OF BREATH  *UNUSUAL BRUISING OR BLEEDING  TENDERNESS IN MOUTH AND THROAT WITH OR WITHOUT PRESENCE OF ULCERS  *URINARY PROBLEMS  *BOWEL PROBLEMS  UNUSUAL RASH Items with * indicate a potential emergency and should be followed up as soon as possible.  Feel free to call the clinic you have any questions or concerns. The clinic phone number is [phone].  Please show the CHEMO ALERT CARD at check-in to the Emergency Department and triage nurse.   

## 2014-12-02 NOTE — Progress Notes (Signed)
Weekly Management Note Current Dose:21 Gy  Projected Dose:30 Gy   Narrative:  The patient presents for routine under treatment assessment.  CBCT/MVCT images/Port film x-rays were reviewed.  The chart was checked. Pain continues. Meds refilled yesterday. "It's just so hard." No appetite.   Physical Findings:  Tearful. In a wheelchair.   Vitals:  Filed Vitals:   12/02/14 1118  BP: 93/60  Temp: 98 F (36.7 C)   Weight:  Wt Readings from Last 3 Encounters:  12/01/14 125 lb 4.8 oz (56.836 kg)  11/18/14 130 lb 3.2 oz (59.058 kg)  11/06/14 132 lb 9.6 oz (60.147 kg)   Lab Results  Component Value Date   WBC 5.5 12/01/2014   HGB 10.3* 12/01/2014   HCT 31.9* 12/01/2014   MCV 79.6 12/01/2014   PLT 538* 12/01/2014   Lab Results  Component Value Date   CREATININE 0.5* 12/01/2014   BUN 5.3* 12/01/2014   NA 129* 12/01/2014   K 3.2* 12/01/2014   CL 102 09/29/2014   CO2 27 12/01/2014     Impression:  The patient is tolerating radiation.  Plan:  Continue treatment as planned. Refer to SW. Discussed time course of RT response.

## 2014-12-02 NOTE — Telephone Encounter (Signed)
-----   Message from Curt Bears, MD sent at 12/01/2014  5:50 PM EDT ----- Call patient with the result and order K Dur 20 meq po qd x 7 days

## 2014-12-02 NOTE — Telephone Encounter (Signed)
I left Vm for pt to pick up kdur.

## 2014-12-02 NOTE — Progress Notes (Signed)
Weekly assessment of radiation to left rib-cage.Completed 7 of 10 treatments.Pain level "6".Scheduled for chemotherapy today.Will give brother card to schedule one month follow up.Will pick up medrol dose pack from pharmacy today.

## 2014-12-03 ENCOUNTER — Ambulatory Visit
Admission: RE | Admit: 2014-12-03 | Discharge: 2014-12-03 | Disposition: A | Discharge status: Home / Self Care | Payer: 59 | Source: Ambulatory Visit | Attending: Radiation Oncology | Admitting: Radiation Oncology

## 2014-12-03 DIAGNOSIS — Z510 Encounter for antineoplastic radiation therapy: Secondary | ICD-10-CM | POA: Diagnosis not present

## 2014-12-03 NOTE — Patient Instructions (Signed)
Proceed with cycle #2 of immunotherapy with Nivolumab as scheduled Keep your appointment to establish care with a primary care provider as scheduled Continue your radiation therapy as scheduled Follow-up in 2 weeks prior to next scheduled cycle of immunotherapy As discussed we will make a consult for the home palliative care service

## 2014-12-04 ENCOUNTER — Ambulatory Visit
Admission: RE | Admit: 2014-12-04 | Discharge: 2014-12-04 | Disposition: A | Discharge status: Home / Self Care | Payer: 59 | Source: Ambulatory Visit | Attending: Radiation Oncology | Admitting: Radiation Oncology

## 2014-12-04 DIAGNOSIS — Z510 Encounter for antineoplastic radiation therapy: Secondary | ICD-10-CM | POA: Diagnosis not present

## 2014-12-05 ENCOUNTER — Encounter: Payer: Self-pay | Admitting: Radiation Oncology

## 2014-12-05 ENCOUNTER — Ambulatory Visit
Admission: RE | Admit: 2014-12-05 | Discharge: 2014-12-05 | Disposition: A | Discharge status: Home / Self Care | Payer: 59 | Source: Ambulatory Visit | Attending: Radiation Oncology | Admitting: Radiation Oncology

## 2014-12-05 DIAGNOSIS — Z510 Encounter for antineoplastic radiation therapy: Secondary | ICD-10-CM | POA: Diagnosis not present

## 2014-12-14 NOTE — Progress Notes (Signed)
  Radiation Oncology         [phone] ________________________________  Name: [name] MRN: [id number]  Date: 12/05/2014  DOB: 01/22/1966  End of Treatment Note  Diagnosis:   Lung cancer   Staging form: Lung, AJCC 7th Edition     Clinical stage from 10/28/2014: Stage IV (T2b, N3, M1b) - Signed by Curt Bears, MD on 10/28/2014       Staging comments: Squamous cell carcinoma  Squamous cell lung cancer   Staging form: Lung, AJCC 7th Edition     Clinical stage from 10/28/2014: Stage IV (T2b, N3, M1b) - Unsigned      Indication for treatment:  Palliative  Radiation treatment dates:   11/24/2014-12/05/2014  Site/dose:   Left ribs to 30 Gy in 10 fractions at 3 Gy per fraction  Beams/energy:   Wedge pair with 15 MV photons  Narrative: The patient tolerated radiation treatment relatively well.   She unfortunately did not respond during treatment in terms of her pain level.  We discussed this can take weeks to occur.   Plan: The patient has completed radiation treatment. The patient will return to radiation oncology clinic for routine followup in one month. I advised them to call or return sooner if they have any questions or concerns related to their recovery or treatment.  ------------------------------------------------  Thea Silversmith, MD

## 2014-12-16 ENCOUNTER — Ambulatory Visit: Payer: 59

## 2014-12-16 ENCOUNTER — Other Ambulatory Visit: Payer: 59

## 2014-12-16 ENCOUNTER — Encounter: Payer: Self-pay | Admitting: Physician Assistant

## 2014-12-16 ENCOUNTER — Ambulatory Visit: Payer: 59 | Admitting: Nutrition

## 2014-12-16 ENCOUNTER — Ambulatory Visit (HOSPITAL_BASED_OUTPATIENT_CLINIC_OR_DEPARTMENT_OTHER): Payer: 59

## 2014-12-16 ENCOUNTER — Ambulatory Visit (HOSPITAL_BASED_OUTPATIENT_CLINIC_OR_DEPARTMENT_OTHER): Payer: 59 | Admitting: Physician Assistant

## 2014-12-16 ENCOUNTER — Other Ambulatory Visit (HOSPITAL_BASED_OUTPATIENT_CLINIC_OR_DEPARTMENT_OTHER): Payer: 59

## 2014-12-16 ENCOUNTER — Telehealth: Payer: Self-pay | Admitting: Internal Medicine

## 2014-12-16 VITALS — BP 102/61 | HR 81 | Temp 97.7°F | Resp 18 | Ht 67.0 in | Wt 117.3 lb

## 2014-12-16 DIAGNOSIS — C3492 Malignant neoplasm of unspecified part of left bronchus or lung: Secondary | ICD-10-CM

## 2014-12-16 DIAGNOSIS — C779 Secondary and unspecified malignant neoplasm of lymph node, unspecified: Secondary | ICD-10-CM

## 2014-12-16 DIAGNOSIS — C3490 Malignant neoplasm of unspecified part of unspecified bronchus or lung: Secondary | ICD-10-CM | POA: Diagnosis not present

## 2014-12-16 DIAGNOSIS — C782 Secondary malignant neoplasm of pleura: Secondary | ICD-10-CM

## 2014-12-16 DIAGNOSIS — Z5112 Encounter for antineoplastic immunotherapy: Secondary | ICD-10-CM

## 2014-12-16 DIAGNOSIS — Z79899 Other long term (current) drug therapy: Secondary | ICD-10-CM

## 2014-12-16 DIAGNOSIS — I82210 Acute embolism and thrombosis of superior vena cava: Secondary | ICD-10-CM

## 2014-12-16 DIAGNOSIS — R0602 Shortness of breath: Secondary | ICD-10-CM

## 2014-12-16 DIAGNOSIS — R05 Cough: Secondary | ICD-10-CM

## 2014-12-16 DIAGNOSIS — G893 Neoplasm related pain (acute) (chronic): Secondary | ICD-10-CM

## 2014-12-16 DIAGNOSIS — R079 Chest pain, unspecified: Secondary | ICD-10-CM

## 2014-12-16 LAB — TECHNOLOGIST REVIEW

## 2014-12-16 LAB — COMPREHENSIVE METABOLIC PANEL (CC13)
ANION GAP: 7 meq/L (ref 3–11)
AST: 10 U/L (ref 5–34)
Albumin: 2.4 g/dL — ABNORMAL LOW (ref 3.5–5.0)
Alkaline Phosphatase: 134 U/L (ref 40–150)
BUN: 9 mg/dL (ref 7.0–26.0)
CALCIUM: 8.8 mg/dL (ref 8.4–10.4)
CHLORIDE: 101 meq/L (ref 98–109)
CO2: 26 mEq/L (ref 22–29)
CREATININE: 0.6 mg/dL (ref 0.6–1.1)
EGFR: >90 mL/min/{1.73_m2} (ref >90)
Glucose: 85 mg/dl (ref 70–140)
POTASSIUM: 3.8 meq/L (ref 3.5–5.1)
Sodium: 134 mEq/L — ABNORMAL LOW (ref 136–145)
TOTAL PROTEIN: 5.8 g/dL — AB (ref 6.4–8.3)
Total Bilirubin: 0.41 mg/dL (ref 0.20–1.20)

## 2014-12-16 LAB — CBC WITH DIFFERENTIAL/PLATELET
BASO%: 0.9 % (ref 0.0–2.0)
Basophils Absolute: 0.1 10*3/uL (ref 0.0–0.1)
EOS ABS: 0.2 10*3/uL (ref 0.0–0.5)
EOS%: 1.7 % (ref 0.0–7.0)
HCT: 38.3 % (ref 34.8–46.6)
HEMOGLOBIN: 12.3 g/dL (ref 11.6–15.9)
LYMPH#: 0.2 10*3/uL — AB (ref 0.9–3.3)
LYMPH%: 2.2 % — ABNORMAL LOW (ref 14.0–49.7)
MCH: 25.4 pg (ref 25.1–34.0)
MCHC: 32.1 g/dL (ref 31.5–36.0)
MCV: 79.1 fL — AB (ref 79.5–101.0)
MONO#: 1 10*3/uL — ABNORMAL HIGH (ref 0.1–0.9)
MONO%: 10.1 % (ref 0.0–14.0)
NEUT%: 85.1 % — ABNORMAL HIGH (ref 38.4–76.8)
NEUTROS ABS: 8.6 10*3/uL — AB (ref 1.5–6.5)
Platelets: 694 10*3/uL — ABNORMAL HIGH (ref 145–400)
RBC: 4.84 10*6/uL (ref 3.70–5.45)
RDW: 18.6 % — AB (ref 11.2–14.5)
WBC: 10 10*3/uL (ref 3.9–10.3)

## 2014-12-16 LAB — TSH CHCC: TSH: 1.558 m(IU)/L (ref 0.308–3.960)

## 2014-12-16 MED ORDER — SODIUM CHLORIDE 0.9 % IV SOLN
Freq: Once | INTRAVENOUS | Status: AC
  Administered 2014-12-16: 11:00:00 via INTRAVENOUS

## 2014-12-16 MED ORDER — SODIUM CHLORIDE 0.9 % IV SOLN
3.0000 mg/kg | Freq: Once | INTRAVENOUS | Status: AC
  Administered 2014-12-16: 160 mg via INTRAVENOUS
  Filled 2014-12-16: qty 16

## 2014-12-16 MED ORDER — PROCHLORPERAZINE MALEATE 10 MG PO TABS
10.0000 mg | ORAL_TABLET | Freq: Four times a day (QID) | ORAL | Status: DC | PRN
  Administered 2014-12-16: 10 mg via ORAL

## 2014-12-16 MED ORDER — FENTANYL 100 MCG/HR TD PT72: 100.0000 ug | MEDICATED_PATCH | TRANSDERMAL | Status: DC

## 2014-12-16 MED ORDER — PROCHLORPERAZINE MALEATE 10 MG PO TABS
ORAL_TABLET | ORAL | Status: AC
  Filled 2014-12-16: qty 1

## 2014-12-16 NOTE — Patient Instructions (Signed)
[facility] Discharge Instructions for Patients Receiving Chemotherapy  Today you received the following chemotherapy agents nivolumab   To help prevent nausea and vomiting after your treatment, we encourage you to take your nausea medication as directed   If you develop nausea and vomiting that is not controlled by your nausea medication, call the clinic.   BELOW ARE SYMPTOMS THAT SHOULD BE REPORTED IMMEDIATELY:  *FEVER GREATER THAN 100.5 F  *CHILLS WITH OR WITHOUT FEVER  NAUSEA AND VOMITING THAT IS NOT CONTROLLED WITH YOUR NAUSEA MEDICATION  *UNUSUAL SHORTNESS OF BREATH  *UNUSUAL BRUISING OR BLEEDING  TENDERNESS IN MOUTH AND THROAT WITH OR WITHOUT PRESENCE OF ULCERS  *URINARY PROBLEMS  *BOWEL PROBLEMS  UNUSUAL RASH Items with * indicate a potential emergency and should be followed up as soon as possible.  Feel free to call the clinic you have any questions or concerns. The clinic phone number is [phone].  

## 2014-12-16 NOTE — Progress Notes (Signed)
49 year old female diagnosed with metastatic lung cancer.  She is a patient of Dr. Earlie Server.  Past medical history is not significant.  Medications include Maalox, Colace, Ativan, Medrol Dosepak, Prilosec, Zofran, MiraLAX, and Senokot-S.  Labs include sodium 129, potassium 3.2, BUN 5.3, creatinine 0.5, albumin 2.4 on June 13.   Height: 67 inches. Weight: 117.3 pounds. Usual body weight: 130-135 pounds per patient. BMI: 18.37.  Patient was seen during chemotherapy. Patient reports poor appetite and increased pain. Reports nausea today, states nausea is controlled at home.   Patient reports she has had snacks available to her and is trying to eat more often. Complains of milk intolerance but isn't sure why.  Nutrition diagnosis: Unintended weight loss related to diagnosis of metastatic cancer as evidenced by approximately 15 pound weight loss from usual weight.  Intervention:  Patient educated on strategies for consuming high-calorie high-protein foods in small frequent meals and snacks. Reviewed fact sheet on increasing calories and protein and provided for patient. Encouraged patient to try to eat every 2 hours to improve appetite.  Provided fact sheet on on poor appetite. Recommended patient consume Carnation breakfast essentials twice a day in consume with lactate milk or soy milk if tolerance is an issue. Provided coupons for instant breakfast. Questions were answered.  Teach back method used and contact information was provided.  Monitoring, evaluation, goals: Patient will tolerate adequate calories and protein to minimize further weight loss.  Next visit: Tuesday, July 12, during infusion.  **Disclaimer: This note was dictated with voice recognition software. Similar sounding words can inadvertently be transcribed and this note may contain transcription errors which may not have been corrected upon publication of note.**

## 2014-12-16 NOTE — Telephone Encounter (Signed)
Gave pt new sched

## 2014-12-16 NOTE — Patient Instructions (Signed)
Continue with pain management as outlined by the home palliative care provider Follow-up in 2 weeks prior to next scheduled cycle of immunotherapy

## 2014-12-16 NOTE — Progress Notes (Addendum)
[place] [phone]) [phone]   [phone]  OFFICE PROGRESS NOTE  No primary care provider on file. No primary provider on file.  DIAGNOSIS:  1) Metastatic non-small cell lung cancer, squamous cell carcinoma initially diagnosed as a stage IIIB diagnosed in March 2015. 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  PRIOR THERAPY: Status post course of concurrent chemoradiation with weekly carboplatin and paclitaxel but the patient did not receive any consolidation chemotherapy.  CURRENT THERAPY: Immunotherapy with Nivolumab 3 MG/KG every 2 weeks. First dose 11/18/2014. Status post 2 cycles  INTERVAL HISTORY: [name] 49 y.o. female returns to the clinic today for follow-up visit accompanied by her brother. She reports having a good experience with the home palliative care consult. She has been changed to diclofenac and Cymbalta with good results. She continues on the long-acting morphine as well as the fentanyl patches. She states that she neglected to ask the palliative care nurse practitioner for a refill for her fentanyl patches and requests a bridge prescription today. She states that the palliative care nurse practitioner started her on Marinol for appetite stimulation however this has not as yet been cleared/filled by her pharmacy. She is tolerating her treatment with immunotherapy with  Nivolumab without difficulty. She reports her appetite is improving slightly. She reports that she has not had to take the [facility] and several days..  She is currently undergoing palliative radiation therapy to her ribs and feels that it may be beginning to help in terms of pain management.  She denied having any night sweats. The patient continues to have left-sided chest pain with shortness of breath and mild cough with no hemoptysis. She has had some weight loss. She has no fever or chills.   MEDICAL HISTORY: Past  Medical History  Diagnosis Date  . Cancer     left bronchioles    ALLERGIES:  is allergic to penicillins.  MEDICATIONS:  Current Outpatient Prescriptions  Medication Sig Dispense Refill  . acetaminophen (TYLENOL) 325 MG tablet Take 2 tablets (650 mg total) by mouth every 6 (six) hours as needed for mild pain (or Fever >/= 101).    Marland Kitchen albuterol (PROVENTIL) (2.5 MG/3ML) 0.083% nebulizer solution 2.5 mg nebulized 3 times a day and every 4hrs as needed for wheezing or shortness of breath. 75 mL 12  . alum & mag hydroxide-simeth (MAALOX/MYLANTA) 200-200-20 MG/5ML suspension Take 30 mLs by mouth every 4 (four) hours as needed for indigestion or heartburn. 355 mL 0  . docusate sodium (COLACE) 100 MG capsule Take 1 capsule (100 mg total) by mouth 2 (two) times daily. 60 capsule 0  . emollient (BIAFINE) cream Apply topically as needed.    . enoxaparin (LOVENOX) 80 MG/0.8ML injection INJECT 0.8 MLS INTO THE SKIN DAILY 30 Syringe 0  . feeding supplement, RESOURCE BREEZE, (RESOURCE BREEZE) LIQD Take 1 Container by mouth 3 (three) times daily between meals. 90 Container 2  . fentaNYL (DURAGESIC - DOSED MCG/HR) 100 MCG/HR Place 1 patch (100 mcg total) onto the skin every 3 (three) days. 5 patch 0  . ipratropium (ATROVENT) 0.02 % nebulizer solution Take 2.5 mLs (0.5 mg total) by nebulization 3 (three) times daily. 75 mL 0  . LORazepam (ATIVAN) 1 MG tablet Take 1 tablet (1 mg total) by mouth every 6 (six) hours as needed for anxiety. 30 tablet 0  . methylPREDNISolone (MEDROL DOSEPAK) 4 MG TBPK tablet TAKE AS DIRECTED WITH FOOD 21  tablet 0  . metoprolol tartrate (LOPRESSOR) 25 MG tablet Take 1 tablet (25 mg total) by mouth 2 (two) times daily. 60 tablet 0  . morphine (MS CONTIN) 15 MG 12 hr tablet Take 1 tablet (15 mg total) by mouth 3 (three) times daily. 90 tablet 0  . morphine (MS CONTIN) 30 MG 12 hr tablet Take 1 tablet (30 mg total) by mouth every 8 (eight) hours. 90 tablet 0  . morphine (MSIR) 15 MG  tablet Take 2 tablets (30 mg total) by mouth every 4 (four) hours as needed for severe pain. 90 tablet 0  . nicotine (NICODERM CQ - DOSED IN MG/24 HOURS) 14 mg/24hr patch Place 1 patch (14 mg total) onto the skin daily. 28 patch 0  . omeprazole (PRILOSEC) 20 MG capsule Take 1 capsule (20 mg total) by mouth 2 (two) times daily before a meal. 60 capsule 1  . ondansetron (ZOFRAN) 4 MG tablet Take 1 tablet (4 mg total) by mouth every 8 (eight) hours as needed for nausea or vomiting. 20 tablet 0  . ondansetron (ZOFRAN) 8 MG tablet TAKE 1 TABLET(8 MG) BY MOUTH EVERY 8 HOURS AS NEEDED FOR NAUSEA OR VOMITING 20 tablet 0  . polyethylene glycol (MIRALAX / GLYCOLAX) packet Take 17 g by mouth 3 (three) times daily. May decrease to twice daily if you have frequent bowel movements. 120 each 2  . potassium chloride SA (K-DUR,KLOR-CON) 20 MEQ tablet Take 1 tablet (20 mEq total) by mouth daily. 7 tablet 0  . senna-docusate (SENOKOT-S) 8.6-50 MG per tablet Take 1 tablet by mouth at bedtime. 30 tablet 2   No current facility-administered medications for this visit.   Facility-Administered Medications Ordered in Other Visits  Medication Dose Route Frequency Provider Last Rate Last Dose  . prochlorperazine (COMPAZINE) tablet 10 mg  10 mg Oral Q6H PRN Darrol Jump, PA-C   10 mg at 12/16/14 1128    SURGICAL HISTORY:  Past Surgical History  Procedure Laterality Date  . Esophagogastroduodenoscopy N/A 09/14/2014    Procedure: ESOPHAGOGASTRODUODENOSCOPY (EGD);  Surgeon: Teena Irani, MD;  Location: [facility];  Service: Endoscopy;  Laterality: N/A;    REVIEW OF SYSTEMS:  Constitutional: positive for anorexia, fatigue and weight loss Eyes: negative Ears, nose, mouth, throat, and face: negative Respiratory: positive for cough and dyspnea on exertion Cardiovascular: negative Gastrointestinal: negative Genitourinary:negative Integument/breast: negative Hematologic/lymphatic: negative Musculoskeletal:positive for  back pain and bone pain Neurological: negative Behavioral/Psych: negative Endocrine: negative Allergic/Immunologic: negative   PHYSICAL EXAMINATION: General appearance: alert, cooperative, fatigued, no distress and tearful Head: Normocephalic, without obvious abnormality, atraumatic Neck: no adenopathy, no JVD, supple, symmetrical, trachea midline and thyroid not enlarged, symmetric, no tenderness/mass/nodules Lymph nodes: Cervical, supraclavicular, and axillary nodes normal. Resp: wheezes bilaterally Back: symmetric, no curvature. ROM normal. No CVA tenderness. Cardio: regular rate and rhythm, S1, S2 normal, no murmur, click, rub or gallop GI: soft, non-tender; bowel sounds normal; no masses,  no organomegaly Extremities: extremities normal, atraumatic, no cyanosis or edema Neurologic: Alert and oriented X 3, normal strength and tone. Normal symmetric reflexes. Normal coordination and gait  ECOG PERFORMANCE STATUS: 2 - Symptomatic, <50% confined to bed  Blood pressure 102/61, pulse 81, temperature 97.7 F (36.5 C), temperature source Oral, resp. rate 18, height '5\' 7"'$  (1.702 m), weight 117 lb 4.8 oz (53.207 kg), SpO2 96 %.  LABORATORY DATA: Lab Results  Component Value Date   WBC 10.0 12/16/2014   HGB 12.3 12/16/2014   HCT 38.3 12/16/2014   MCV 79.1* 12/16/2014  PLT 694* 12/16/2014      Chemistry      Component Value Date/Time   NA 134* 12/16/2014 0940   NA 131* 09/29/2014 0540   K 3.8 12/16/2014 0940   K 5.1 09/29/2014 0540   CL 102 09/29/2014 0540   CO2 26 12/16/2014 0940   CO2 22 09/29/2014 0540   BUN 9.0 12/16/2014 0940   BUN 21 09/29/2014 0540   CREATININE 0.6 12/16/2014 0940   CREATININE 0.56 09/29/2014 0540      Component Value Date/Time   CALCIUM 8.8 12/16/2014 0940   CALCIUM 9.5 09/29/2014 0540   ALKPHOS 134 12/16/2014 0940   ALKPHOS 81 09/27/2014 0550   AST 10 12/16/2014 0940   AST 14 09/27/2014 0550   ALT <6 12/16/2014 0940   ALT 6 09/27/2014 0550    BILITOT 0.41 12/16/2014 0940   BILITOT 0.7 09/27/2014 0550       RADIOGRAPHIC STUDIES: No results found.  ASSESSMENT AND PLAN: This is a very pleasant 49 year old white female with metastatic non-small cell lung cancer, adenocarcinoma initially diagnosed as a stage IIIB status post course of concurrent chemoradiation. The recent MRI of the brain showed no evidence for metastatic disease to the brain but the PET scan showed extensive left pleural and chest wall metastasis with mild left pleural tumor extending directly into the left sided mediastinum as well as isolated left internal mammary nodal metastasis. There was no evidence for extrathoracic metastatic disease.  Patient discussed with and also seen by  Dr. Julien Nordmann. She will proceed with cycle 3 of immunotherapy with Nivolumab 3 MG/KG every 2 weeks as scheduled.  For pain management she will continue with the current pain medication. I have refilled her fentanyl 100 g patches a total of 5 patches with no refill. We've the remainder of her pain medication refills to her home palliative care management. She will follow-up in 2 weeks prior to start of cycle #4 after which we will obtain a restaging CT scan to reevaluate her disease.   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   She was advised to call immediately if she has any concerning symptoms in the interval.  The patient voices understanding of current disease status and treatment options and is in agreement with the current care plan.  All questions were answered. The patient knows to call the clinic with any problems, questions or concerns. We can certainly see the patient much sooner if necessary.  Carlton Adam, PA-C 12/16/2014  ADDENDUM: Hematology/Oncology Attending:  I had a face to face encounter with the patient. I recommended her care plan. This is a very pleasant 49 years old white female with  metastatic non-small cell lung cancer, squamous cell carcinoma is currently undergoing treatment with immunotherapy with Nivolumab status post 2 cycles. She is tolerating her immunotherapy fairly well with no significant adverse effects.  I recommended for the patient to proceed with cycle #3 today as scheduled.  For pain management she is currently followed by the  Home palliative care team for management of her pain issues.  The patient would come back for follow-up visit in 2 weeks for reevaluation before starting cycle #4.  She was advised to call immediately if she has any concerning symptoms in the interval.  Disclaimer: This note was dictated with voice recognition software. Similar sounding words can inadvertently be transcribed and may be missed upon review. Eilleen Kempf., MD 12/17/2014

## 2014-12-24 ENCOUNTER — Other Ambulatory Visit: Payer: Self-pay | Admitting: Oncology

## 2014-12-25 ENCOUNTER — Telehealth: Payer: Self-pay | Admitting: *Deleted

## 2014-12-25 ENCOUNTER — Other Ambulatory Visit: Payer: Self-pay | Admitting: *Deleted

## 2014-12-25 DIAGNOSIS — C3492 Malignant neoplasm of unspecified part of left bronchus or lung: Secondary | ICD-10-CM

## 2014-12-25 MED ORDER — MORPHINE SULFATE ER 15 MG PO TBCR: 15.0000 mg | EXTENDED_RELEASE_TABLET | Freq: Three times a day (TID) | ORAL | Status: DC

## 2014-12-25 MED ORDER — MORPHINE SULFATE ER 30 MG PO TBCR: 30.0000 mg | EXTENDED_RELEASE_TABLET | Freq: Three times a day (TID) | ORAL | Status: DC

## 2014-12-25 MED ORDER — MORPHINE SULFATE 15 MG PO TABS: 30.0000 mg | ORAL_TABLET | ORAL | Status: DC | PRN

## 2014-12-25 NOTE — Telephone Encounter (Signed)
Notified pt rx ready for pick up friday 830am-430pm

## 2014-12-25 NOTE — Telephone Encounter (Signed)
Pt called request for refill on Morphine '30mg'$  ER and Morphine '15mg'$  IR.  Will review with MD

## 2014-12-25 NOTE — Telephone Encounter (Signed)
Patient called requesting  Morphine 30 mg extended release and Morphine 15  Mg immediate release.  Return number for pick up is [phone].  States she will need fentanyl when she comes in for F/U next week.

## 2014-12-30 ENCOUNTER — Ambulatory Visit: Payer: 59 | Admitting: Nutrition

## 2014-12-30 ENCOUNTER — Other Ambulatory Visit (HOSPITAL_BASED_OUTPATIENT_CLINIC_OR_DEPARTMENT_OTHER): Payer: 59

## 2014-12-30 ENCOUNTER — Ambulatory Visit (HOSPITAL_BASED_OUTPATIENT_CLINIC_OR_DEPARTMENT_OTHER): Payer: 59 | Admitting: Physician Assistant

## 2014-12-30 ENCOUNTER — Ambulatory Visit (HOSPITAL_BASED_OUTPATIENT_CLINIC_OR_DEPARTMENT_OTHER): Payer: 59

## 2014-12-30 ENCOUNTER — Encounter: Payer: Self-pay | Admitting: Physician Assistant

## 2014-12-30 ENCOUNTER — Telehealth: Payer: Self-pay | Admitting: Internal Medicine

## 2014-12-30 VITALS — BP 121/81 | HR 84 | Temp 97.6°F | Resp 17 | Ht 67.0 in | Wt 115.1 lb

## 2014-12-30 DIAGNOSIS — C782 Secondary malignant neoplasm of pleura: Secondary | ICD-10-CM | POA: Diagnosis not present

## 2014-12-30 DIAGNOSIS — Z5112 Encounter for antineoplastic immunotherapy: Secondary | ICD-10-CM | POA: Diagnosis not present

## 2014-12-30 DIAGNOSIS — C3492 Malignant neoplasm of unspecified part of left bronchus or lung: Secondary | ICD-10-CM

## 2014-12-30 DIAGNOSIS — Z86718 Personal history of other venous thrombosis and embolism: Secondary | ICD-10-CM | POA: Diagnosis not present

## 2014-12-30 DIAGNOSIS — C7989 Secondary malignant neoplasm of other specified sites: Secondary | ICD-10-CM | POA: Diagnosis not present

## 2014-12-30 DIAGNOSIS — C3490 Malignant neoplasm of unspecified part of unspecified bronchus or lung: Secondary | ICD-10-CM

## 2014-12-30 LAB — CBC WITH DIFFERENTIAL/PLATELET
BASO%: 0.4 % (ref 0.0–2.0)
Basophils Absolute: 0 10*3/uL (ref 0.0–0.1)
EOS%: 4 % (ref 0.0–7.0)
Eosinophils Absolute: 0.3 10*3/uL (ref 0.0–0.5)
HEMATOCRIT: 35.8 % (ref 34.8–46.6)
HGB: 12 g/dL (ref 11.6–15.9)
LYMPH%: 4.3 % — ABNORMAL LOW (ref 14.0–49.7)
MCH: 25.8 pg (ref 25.1–34.0)
MCHC: 33.5 g/dL (ref 31.5–36.0)
MCV: 77 fL — ABNORMAL LOW (ref 79.5–101.0)
MONO#: 0.8 10*3/uL (ref 0.1–0.9)
MONO%: 10 % (ref 0.0–14.0)
NEUT%: 81.3 % — ABNORMAL HIGH (ref 38.4–76.8)
NEUTROS ABS: 6.7 10*3/uL — AB (ref 1.5–6.5)
Platelets: 846 10*3/uL — ABNORMAL HIGH (ref 145–400)
RBC: 4.65 10*6/uL (ref 3.70–5.45)
RDW: 18.9 % — ABNORMAL HIGH (ref 11.2–14.5)
WBC: 8.2 10*3/uL (ref 3.9–10.3)
lymph#: 0.4 10*3/uL — ABNORMAL LOW (ref 0.9–3.3)

## 2014-12-30 LAB — COMPREHENSIVE METABOLIC PANEL (CC13)
ALBUMIN: 2.4 g/dL — AB (ref 3.5–5.0)
ALK PHOS: 133 U/L (ref 40–150)
AST: 9 U/L (ref 5–34)
Anion Gap: 9 mEq/L (ref 3–11)
BILIRUBIN TOTAL: 0.29 mg/dL (ref 0.20–1.20)
BUN: 6.4 mg/dL — ABNORMAL LOW (ref 7.0–26.0)
CALCIUM: 9.6 mg/dL (ref 8.4–10.4)
CHLORIDE: 100 meq/L (ref 98–109)
CO2: 23 meq/L (ref 22–29)
CREATININE: 0.6 mg/dL (ref 0.6–1.1)
Glucose: 97 mg/dl (ref 70–140)
Potassium: 4.1 mEq/L (ref 3.5–5.1)
Sodium: 133 mEq/L — ABNORMAL LOW (ref 136–145)
TOTAL PROTEIN: 6.2 g/dL — AB (ref 6.4–8.3)

## 2014-12-30 LAB — TECHNOLOGIST REVIEW

## 2014-12-30 MED ORDER — FENTANYL 100 MCG/HR TD PT72: 100.0000 ug | MEDICATED_PATCH | TRANSDERMAL | Status: DC

## 2014-12-30 MED ORDER — SODIUM CHLORIDE 0.9 % IV SOLN
3.0000 mg/kg | Freq: Once | INTRAVENOUS | Status: AC
  Administered 2014-12-30: 160 mg via INTRAVENOUS
  Filled 2014-12-30: qty 16

## 2014-12-30 MED ORDER — SODIUM CHLORIDE 0.9 % IV SOLN
Freq: Once | INTRAVENOUS | Status: AC
  Administered 2014-12-30: 13:00:00 via INTRAVENOUS

## 2014-12-30 NOTE — Patient Instructions (Signed)
Follow-up in 2 weeks with a restaging CT scan of your chest, abdomen and pelvis to reevaluate your disease Keep a careful walk of the use of all your pain medications over the next 2 weeks so we can reevaluate your current needs

## 2014-12-30 NOTE — Progress Notes (Addendum)
[place] [phone]   [phone]  OFFICE PROGRESS NOTE  No primary care provider on file. No primary provider on file.  DIAGNOSIS:  1) Metastatic non-small cell lung cancer, squamous cell carcinoma initially diagnosed as a stage IIIB diagnosed in March 2015. 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  PRIOR THERAPY: Status post course of concurrent chemoradiation with weekly carboplatin and paclitaxel but the patient did not receive any consolidation chemotherapy.  CURRENT THERAPY: Immunotherapy with Nivolumab 3 MG/KG every 2 weeks. First dose 11/18/2014. Status post 3 cycles  INTERVAL HISTORY: [name] 49 y.o. female returns to the clinic today for follow-up visit accompanied by her brother. She continues to report having a good experience with the home palliative care consult. She continues on diclofenac and Cymbalta with good results. She continues on the long-acting morphine as well as the fentanyl patches. She reports that she is now taking her short acting morphine about 2-3 times a day as opposed to every 4 hours as she was doing previously. She is completed her radiation therapy and is to see Dr. Pablo Ledger and follow-up on 01/07/2015. She requests a refill prescription for her Fentanyl patches. She is tolerating her treatment with immunotherapy with  Nivolumab without difficulty. She reports her appetite is improving slightly.   She denied having any night sweats. The patient continues to have left-sided chest pain with shortness of breath and mild cough with no hemoptysis. She has had some weight loss. She has no fever or chills. She presents to proceed with cycle #4. She reports that she has a new primary care provider, Natalie Lyme, FNP-C, [place], [place]. The phone number to that office is [phone] with a fax number [phone].  MEDICAL  HISTORY: Past Medical History  Diagnosis Date  . Cancer     left bronchioles    ALLERGIES:  is allergic to penicillins.  MEDICATIONS:  Current Outpatient Prescriptions  Medication Sig Dispense Refill  . acetaminophen (TYLENOL) 325 MG tablet Take 2 tablets (650 mg total) by mouth every 6 (six) hours as needed for mild pain (or Fever >/= 101).    . DULoxetine (CYMBALTA) 30 MG capsule Take 30 mg by mouth.    . emollient (BIAFINE) cream Apply topically as needed.    . enoxaparin (LOVENOX) 80 MG/0.8ML injection ADMINISTER 0.8 ML UNDER THE SKIN DAILY 30 Syringe 0  . fentaNYL (DURAGESIC - DOSED MCG/HR) 100 MCG/HR Place 1 patch (100 mcg total) onto the skin every 3 (three) days. 5 patch 0  . ipratropium (ATROVENT) 0.02 % nebulizer solution Take 2.5 mLs (0.5 mg total) by nebulization 3 (three) times daily. 75 mL 0  . ketorolac (TORADOL) 10 MG tablet TK 1 T PO Q 6 H FOR 5 DAYS  0  . LORazepam (ATIVAN) 1 MG tablet Take 1 tablet (1 mg total) by mouth every 6 (six) hours as needed for anxiety. 30 tablet 0  . morphine (MS CONTIN) 15 MG 12 hr tablet Take 1 tablet (15 mg total) by mouth 3 (three) times daily. 90 tablet 0  . morphine (MS CONTIN) 30 MG 12 hr tablet Take 1 tablet (30 mg total) by mouth every 8 (eight) hours. 90 tablet 0  . morphine (MSIR) 15 MG tablet Take 2 tablets (30 mg total) by mouth every 4 (four) hours as needed for severe pain. 90 tablet 0  . omeprazole (PRILOSEC) 20 MG capsule  Take 1 capsule (20 mg total) by mouth 2 (two) times daily before a meal. 60 capsule 1  . ondansetron (ZOFRAN) 8 MG tablet TAKE 1 TABLET(8 MG) BY MOUTH EVERY 8 HOURS AS NEEDED FOR NAUSEA OR VOMITING 20 tablet 0  . polyethylene glycol (MIRALAX / GLYCOLAX) packet Take 17 g by mouth 3 (three) times daily. May decrease to twice daily if you have frequent bowel movements. 120 each 2  . senna-docusate (SENOKOT-S) 8.6-50 MG per tablet Take 1 tablet by mouth at bedtime. 30 tablet 2   No current facility-administered  medications for this visit.   Facility-Administered Medications Ordered in Other Visits  Medication Dose Route Frequency Provider Last Rate Last Dose  . nivolumab (OPDIVO) 160 mg in sodium chloride 0.9 % 100 mL chemo infusion  3 mg/kg (Treatment Plan Actual) Intravenous Once Curt Bears, MD 116 mL/hr at 12/30/14 1257 160 mg at 12/30/14 1257    SURGICAL HISTORY:  Past Surgical History  Procedure Laterality Date  . Esophagogastroduodenoscopy N/A 09/14/2014    Procedure: ESOPHAGOGASTRODUODENOSCOPY (EGD);  Surgeon: Teena Irani, MD;  Location: [facility];  Service: Endoscopy;  Laterality: N/A;    REVIEW OF SYSTEMS:  Constitutional: positive for anorexia, fatigue and weight loss Eyes: negative Ears, nose, mouth, throat, and face: negative Respiratory: positive for cough and dyspnea on exertion Cardiovascular: negative Gastrointestinal: negative Genitourinary:negative Integument/breast: negative Hematologic/lymphatic: negative Musculoskeletal:positive for back pain and bone pain Neurological: negative Behavioral/Psych: negative Endocrine: negative Allergic/Immunologic: negative   PHYSICAL EXAMINATION: General appearance: alert, cooperative, fatigued, no distress and tearful Head: Normocephalic, without obvious abnormality, atraumatic Neck: no adenopathy, no JVD, supple, symmetrical, trachea midline and thyroid not enlarged, symmetric, no tenderness/mass/nodules Lymph nodes: Cervical, supraclavicular, and axillary nodes normal. Resp: wheezes bilaterally Back: symmetric, no curvature. ROM normal. No CVA tenderness. Cardio: regular rate and rhythm, S1, S2 normal, no murmur, click, rub or gallop GI: soft, non-tender; bowel sounds normal; no masses,  no organomegaly Extremities: extremities normal, atraumatic, no cyanosis or edema Neurologic: Alert and oriented X 3, normal strength and tone. Normal symmetric reflexes. Normal coordination and gait  ECOG PERFORMANCE STATUS: 2 -  Symptomatic, <50% confined to bed  Blood pressure 121/81, pulse 84, temperature 97.6 F (36.4 C), temperature source Oral, resp. rate 17, height '5\' 7"'$  (1.702 m), weight 115 lb 1.6 oz (52.209 kg), SpO2 96 %.  LABORATORY DATA: Lab Results  Component Value Date   WBC 8.2 12/30/2014   HGB 12.0 12/30/2014   HCT 35.8 12/30/2014   MCV 77.0* 12/30/2014   PLT 846* 12/30/2014      Chemistry      Component Value Date/Time   NA 133* 12/30/2014 1107   NA 131* 09/29/2014 0540   K 4.1 12/30/2014 1107   K 5.1 09/29/2014 0540   CL 102 09/29/2014 0540   CO2 23 12/30/2014 1107   CO2 22 09/29/2014 0540   BUN 6.4* 12/30/2014 1107   BUN 21 09/29/2014 0540   CREATININE 0.6 12/30/2014 1107   CREATININE 0.56 09/29/2014 0540      Component Value Date/Time   CALCIUM 9.6 12/30/2014 1107   CALCIUM 9.5 09/29/2014 0540   ALKPHOS 133 12/30/2014 1107   ALKPHOS 81 09/27/2014 0550   AST 9 12/30/2014 1107   AST 14 09/27/2014 0550   ALT <6 12/30/2014 1107   ALT 6 09/27/2014 0550   BILITOT 0.29 12/30/2014 1107   BILITOT 0.7 09/27/2014 0550       RADIOGRAPHIC STUDIES: No results found.  ASSESSMENT AND PLAN: This is a  very pleasant 49 year old white female with metastatic non-small cell lung cancer, adenocarcinoma initially diagnosed as a stage IIIB status post course of concurrent chemoradiation. The recent MRI of the brain showed no evidence for metastatic disease to the brain but the PET scan showed extensive left pleural and chest wall metastasis with mild left pleural tumor extending directly into the left sided mediastinum as well as isolated left internal mammary nodal metastasis. There was no evidence for extrathoracic metastatic disease.  Patient discussed with and also seen by  Dr. Julien Nordmann. She will proceed with cycle 4 of immunotherapy with Nivolumab 3 MG/KG every 2 weeks as scheduled.  For pain management she will continue with the current pain medication. I have again refilled her fentanyl  100 g patches a total of 5 patches with no refill. She is too long her use of her pain medications specifically over the next 2 weeks so that week. Re-evaluate her short acting and long-acting pain medication needs. The overall plan would be to back off on the short acting pain medication and ultimately decrease a long acting medication, ultimately having her on disc 1 long-acting pain medication agent.  She will follow-up in 2 weeks prior to start of cycle #5 with a restaging CT scan to reevaluate her disease.   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   She was advised to call immediately if she has any concerning symptoms in the interval.  The patient voices understanding of current disease status and treatment options and is in agreement with the current care plan.  All questions were answered. The patient knows to call the clinic with any problems, questions or concerns. We can certainly see the patient much sooner if necessary.  Carlton Adam, PA-C 12/30/2014  ADDENDUM: Hematology/Oncology Attending: I had a face to face encounter with the patient. I recommended her care plan. This is a very pleasant 49 years old white female with metastatic non-small cell lung cancer, adenocarcinoma who is currently undergoing treatment with immunotherapy with Nivolumab status post 3 cycles. The patient is tolerating her treatment well with no significant adverse effects. She will come back for follow-up visit in 2 weeks for reevaluation after repeating CT scan of the chest, abdomen and pelvis for restaging of her disease. For pain management, her pain is much better and she is using less of the breakthrough pain medication. We will continue to monitor her closely and may consider decreasing her dose of the fentanyl patch or the long acting pain medication gradually. The patient was advised to call if she has any concerning  symptoms in the interval.  Disclaimer: This note was dictated with voice recognition software. Similar sounding words can inadvertently be transcribed and may be missed upon review. Eilleen Kempf., MD 12/31/2014

## 2014-12-30 NOTE — Progress Notes (Signed)
Nutrition follow-up completed with patient during chemotherapy for metastatic lung cancer. Patient reports her oral intake and poor appetite has improved. She is questioning why she has lost weight when she feels she has been eating more than normal. Weight documented 115.1 pounds July 12, down from 117.3 pounds June 28. Patient denies nausea. She now has snacks with increased protein available to her throughout the day. Patient still not tolerating milk. Patient unable to consume oral nutrition supplements.  Nutrition diagnosis: Unintended weight loss continues.  Intervention: Patient educated to continue strategies for increased oral intake of high-calorie, high-protein foods. Provided support and encouragement. Questions answered.  Teach back method used.  Monitoring, evaluation, goals: Patient will work to continue increased oral intake to minimize weight loss.  Next visit: Tuesday, July 26, during chemotherapy.  **Disclaimer: This note was dictated with voice recognition software. Similar sounding words can inadvertently be transcribed and this note may contain transcription errors which may not have been corrected upon publication of note.**

## 2014-12-30 NOTE — Patient Instructions (Signed)
[facility] Discharge Instructions for Patients Receiving Chemotherapy  Today you received the following chemotherapy agents: Nivolumab  To help prevent nausea and vomiting after your treatment, we encourage you to take your nausea medication as prescribed by your physician.    If you develop nausea and vomiting that is not controlled by your nausea medication, call the clinic.   BELOW ARE SYMPTOMS THAT SHOULD BE REPORTED IMMEDIATELY:  *FEVER GREATER THAN 100.5 F  *CHILLS WITH OR WITHOUT FEVER  NAUSEA AND VOMITING THAT IS NOT CONTROLLED WITH YOUR NAUSEA MEDICATION  *UNUSUAL SHORTNESS OF BREATH  *UNUSUAL BRUISING OR BLEEDING  TENDERNESS IN MOUTH AND THROAT WITH OR WITHOUT PRESENCE OF ULCERS  *URINARY PROBLEMS  *BOWEL PROBLEMS  UNUSUAL RASH Items with * indicate a potential emergency and should be followed up as soon as possible.  Feel free to call the clinic you have any questions or concerns. The clinic phone number is [phone].  Please show the CHEMO ALERT CARD at check-in to the Emergency Department and triage nurse.   

## 2014-12-30 NOTE — Telephone Encounter (Signed)
Gave adn printed appt sched and avs for pt for July and jAUG

## 2015-01-02 ENCOUNTER — Telehealth: Payer: Self-pay | Admitting: *Deleted

## 2015-01-02 NOTE — Telephone Encounter (Signed)
Oncology Nurse Navigator Documentation  Oncology Nurse Navigator Flowsheets 01/02/2015  Navigator Encounter Type Other/Telephone call.  I called patient today to check on her and to follow up with smoking cessation.  I left a vm message to call if needing help with my name and phone number  [facility] Needs Education

## 2015-01-06 ENCOUNTER — Telehealth: Payer: Self-pay | Admitting: *Deleted

## 2015-01-06 DIAGNOSIS — C3492 Malignant neoplasm of unspecified part of left bronchus or lung: Secondary | ICD-10-CM

## 2015-01-06 MED ORDER — MORPHINE SULFATE 15 MG PO TABS: 30.0000 mg | ORAL_TABLET | ORAL | Status: DC | PRN

## 2015-01-06 NOTE — Telephone Encounter (Signed)
rx msir locked up.

## 2015-01-06 NOTE — Telephone Encounter (Signed)
Received VM message from patient @ 11:36 am regarding a prescription question. TC back to patient. No answer. Left VM for pt to call back to this office.

## 2015-01-06 NOTE — Telephone Encounter (Signed)
Received call back from patient. She needs a refill on her MSIR 15 mg. She had 90 tabs filled on 12/25/14 and uses approx. 6 tabs/day and therefore would use 90 tabs in 2 weeks time.  She will be here for CT scan tomorrow, 01/07/15 and can pick up prescription tomorrow after 2pm.

## 2015-01-07 ENCOUNTER — Ambulatory Visit (HOSPITAL_COMMUNITY)
Admission: RE | Admit: 2015-01-07 | Discharge: 2015-01-07 | Disposition: A | Discharge status: Home / Self Care | Payer: 59 | Source: Ambulatory Visit | Attending: Physician Assistant | Admitting: Physician Assistant

## 2015-01-07 ENCOUNTER — Ambulatory Visit
Admission: RE | Admit: 2015-01-07 | Discharge: 2015-01-07 | Disposition: A | Discharge status: Home / Self Care | Payer: 59 | Source: Ambulatory Visit | Attending: Radiation Oncology | Admitting: Radiation Oncology

## 2015-01-07 ENCOUNTER — Other Ambulatory Visit: Payer: Self-pay | Admitting: Physician Assistant

## 2015-01-07 ENCOUNTER — Encounter: Payer: Self-pay | Admitting: Radiation Oncology

## 2015-01-07 VITALS — BP 121/86 | HR 100 | Temp 98.5°F | Resp 20 | Ht 67.0 in | Wt 115.4 lb

## 2015-01-07 DIAGNOSIS — C3490 Malignant neoplasm of unspecified part of unspecified bronchus or lung: Secondary | ICD-10-CM

## 2015-01-07 DIAGNOSIS — Z85118 Personal history of other malignant neoplasm of bronchus and lung: Secondary | ICD-10-CM | POA: Insufficient documentation

## 2015-01-07 DIAGNOSIS — C782 Secondary malignant neoplasm of pleura: Secondary | ICD-10-CM | POA: Diagnosis not present

## 2015-01-07 DIAGNOSIS — C3492 Malignant neoplasm of unspecified part of left bronchus or lung: Secondary | ICD-10-CM

## 2015-01-07 MED ORDER — IOHEXOL 300 MG/ML  SOLN: 100.0000 mL | Freq: Once | INTRAMUSCULAR | Status: AC | PRN

## 2015-01-07 NOTE — Progress Notes (Signed)
Department of Radiation Oncology  Phone:  [phone] Fax:        [phone]   Name: [name] MRN: [id number]  DOB: 09-07-1965  Date: 01/07/2015  Follow Up Visit Note  Diagnosis: Lung cancer   Staging form: Lung, AJCC 7th Edition     Clinical stage from 10/28/2014: Stage IV (T2b, N3, M1b) - Signed by Curt Bears, MD on 10/28/2014       Staging comments: Squamous cell carcinoma  Squamous cell lung cancer   Staging form: Lung, AJCC 7th Edition     Clinical stage from 10/28/2014: Stage IV (T2b, N3, M1b) - Unsigned  Summary and Interval since last radiation: Her previous treatment includes immunotherapy with Nivolumab 3 MG/KG every 2 weeks, with her first dose received on 11/18/2014. Status post 3 cycles.  Interval History: [name] presents today for routine followup. Symbolta and Taradol was reported to help alleviate pain. "She was a vegetable, hunched over, crying the whole time," her brother stated when describing [name]'s state of being the past couple of weeks. "I couldn't eat. I couldn't sleep," [name] vocally expressed when describing her recent pain, which has improved drastically due to administration of new medications. When asked about her quality of breathing, she stated that, "It's about the same." There is a laceration to the left eyebrow, due to a fall earlier in the day when she attempted to descend stairs independently, without assistance. MSIR is still being self-administered six times a day. No noticeable changes in condition of skin are noted when examined, especially in the back area. She also expressed frustration due to her difficulty taking a prescribed steroid, as she is required to eat before taking this and is expierencing extreme difficulty eating. She had a CT today and meets with Dr. Julien Nordmann on Thursday.   Physical Exam:   Filed Vitals:   01/07/15 1228  BP: 121/86  Pulse: 100  Temp: 98.5 F (36.9 C)  TempSrc: Oral  Resp: 20  Height: 5'  7" (1.702 m)  Weight: 115 lb 6.4 oz (52.345 kg)  SpO2: 98%   No noticeable changes in condition of skin are noted when examined, especially in the back area. The patient is alert and oriented X3.Ambulatory status is currently wheelchair bound.   IMPRESSION: [name] is a 49 y.o. female  With metastatic lung cancer and excellent response to palliative radiation.   PLAN: Cancer rehabilitation was recommended at this point as pain management has greatly improved and alleviated via new medications. [name] was concerned if insurance would be able to cover her cancer rehabilitation. [name] and her brother are both aware of their future appointment with Dr. Inda Merlin. If she has any future questions or concerns, she and her brother were both encouraged to contact me. They requested for me to contact Dr. Inda Merlin for a prescription. She was advised to attempt to wean off of her pain medication by instead of administering six doses a day, strive for three doses a day. We discussed the benefits and cautions of attempting this goal, including improving her current symptoms of loss of appetite. Other possible future medications were mentioned.   This document serves as a record of services personally performed by Thea Silversmith , MD. It was created on her behalf by Lenn Cal, a trained medical scribe. The creation of this record is based on the scribe's personal observations and the provider's statements to them. This document has been checked and approved by the attending provider.   ------------------------------------------------  Thea Silversmith, MD

## 2015-01-07 NOTE — Progress Notes (Signed)
Applied neosporin and bandaid to patient left forehead above eyebrow laceration 12:55 PM

## 2015-01-07 NOTE — Progress Notes (Signed)
Routine follow up palliative  radiation to left rib cage completed 12/05/14.Pain much improved level "2".Patient fell  Going downstairs at home earlier today and has small laceration over left eye approximately 1/2 inch.Ct of chest and abdomen done but results not final.

## 2015-01-13 ENCOUNTER — Telehealth: Payer: Self-pay | Admitting: Internal Medicine

## 2015-01-13 ENCOUNTER — Ambulatory Visit (HOSPITAL_BASED_OUTPATIENT_CLINIC_OR_DEPARTMENT_OTHER): Payer: 59 | Admitting: Internal Medicine

## 2015-01-13 ENCOUNTER — Telehealth: Payer: Self-pay | Admitting: *Deleted

## 2015-01-13 ENCOUNTER — Other Ambulatory Visit (HOSPITAL_BASED_OUTPATIENT_CLINIC_OR_DEPARTMENT_OTHER): Payer: 59

## 2015-01-13 ENCOUNTER — Encounter: Payer: Self-pay | Admitting: Internal Medicine

## 2015-01-13 ENCOUNTER — Ambulatory Visit: Payer: 59 | Admitting: Nutrition

## 2015-01-13 ENCOUNTER — Ambulatory Visit (HOSPITAL_BASED_OUTPATIENT_CLINIC_OR_DEPARTMENT_OTHER): Payer: 59

## 2015-01-13 VITALS — BP 102/67 | HR 103 | Temp 98.3°F | Resp 17 | Ht 67.0 in | Wt 111.7 lb

## 2015-01-13 DIAGNOSIS — R630 Anorexia: Secondary | ICD-10-CM

## 2015-01-13 DIAGNOSIS — C782 Secondary malignant neoplasm of pleura: Secondary | ICD-10-CM

## 2015-01-13 DIAGNOSIS — I82210 Acute embolism and thrombosis of superior vena cava: Secondary | ICD-10-CM | POA: Diagnosis not present

## 2015-01-13 DIAGNOSIS — R0602 Shortness of breath: Secondary | ICD-10-CM

## 2015-01-13 DIAGNOSIS — R5383 Other fatigue: Secondary | ICD-10-CM

## 2015-01-13 DIAGNOSIS — G893 Neoplasm related pain (acute) (chronic): Secondary | ICD-10-CM | POA: Diagnosis not present

## 2015-01-13 DIAGNOSIS — Z5112 Encounter for antineoplastic immunotherapy: Secondary | ICD-10-CM | POA: Diagnosis not present

## 2015-01-13 DIAGNOSIS — C3492 Malignant neoplasm of unspecified part of left bronchus or lung: Secondary | ICD-10-CM | POA: Diagnosis not present

## 2015-01-13 DIAGNOSIS — R634 Abnormal weight loss: Secondary | ICD-10-CM

## 2015-01-13 DIAGNOSIS — Z79899 Other long term (current) drug therapy: Secondary | ICD-10-CM

## 2015-01-13 LAB — CBC WITH DIFFERENTIAL/PLATELET
BASO%: 0.2 % (ref 0.0–2.0)
BASOS ABS: 0 10*3/uL (ref 0.0–0.1)
EOS%: 1.4 % (ref 0.0–7.0)
Eosinophils Absolute: 0.2 10*3/uL (ref 0.0–0.5)
HCT: 37.3 % (ref 34.8–46.6)
HGB: 12.1 g/dL (ref 11.6–15.9)
LYMPH#: 0.3 10*3/uL — AB (ref 0.9–3.3)
LYMPH%: 2.7 % — AB (ref 14.0–49.7)
MCH: 25.3 pg (ref 25.1–34.0)
MCHC: 32.4 g/dL (ref 31.5–36.0)
MCV: 77.9 fL — ABNORMAL LOW (ref 79.5–101.0)
MONO#: 1.1 10*3/uL — AB (ref 0.1–0.9)
MONO%: 9.7 % (ref 0.0–14.0)
NEUT%: 86 % — ABNORMAL HIGH (ref 38.4–76.8)
NEUTROS ABS: 9.5 10*3/uL — AB (ref 1.5–6.5)
Platelets: 593 10*3/uL — ABNORMAL HIGH (ref 145–400)
RBC: 4.79 10*6/uL (ref 3.70–5.45)
RDW: 16.8 % — ABNORMAL HIGH (ref 11.2–14.5)
WBC: 11 10*3/uL — ABNORMAL HIGH (ref 3.9–10.3)

## 2015-01-13 LAB — COMPREHENSIVE METABOLIC PANEL (CC13)
AST: 8 U/L (ref 5–34)
Albumin: 2.4 g/dL — ABNORMAL LOW (ref 3.5–5.0)
Alkaline Phosphatase: 140 U/L (ref 40–150)
Anion Gap: 10 mEq/L (ref 3–11)
BUN: 6.4 mg/dL — AB (ref 7.0–26.0)
CHLORIDE: 97 meq/L — AB (ref 98–109)
CO2: 25 mEq/L (ref 22–29)
CREATININE: 0.7 mg/dL (ref 0.6–1.1)
Calcium: 9.9 mg/dL (ref 8.4–10.4)
EGFR: >90 mL/min/{1.73_m2} (ref >90)
GLUCOSE: 202 mg/dL — AB (ref 70–140)
Potassium: 3.3 mEq/L — ABNORMAL LOW (ref 3.5–5.1)
Sodium: 132 mEq/L — ABNORMAL LOW (ref 136–145)
Total Bilirubin: 0.3 mg/dL (ref 0.20–1.20)
Total Protein: 6.3 g/dL — ABNORMAL LOW (ref 6.4–8.3)

## 2015-01-13 LAB — TSH CHCC: TSH: 1.441 m(IU)/L (ref 0.308–3.960)

## 2015-01-13 MED ORDER — SODIUM CHLORIDE 0.9 % IV SOLN
3.0000 mg/kg | Freq: Once | INTRAVENOUS | Status: AC
  Administered 2015-01-13: 160 mg via INTRAVENOUS
  Filled 2015-01-13: qty 16

## 2015-01-13 MED ORDER — FENTANYL 100 MCG/HR TD PT72: 100.0000 ug | MEDICATED_PATCH | TRANSDERMAL | Status: DC

## 2015-01-13 MED ORDER — SODIUM CHLORIDE 0.9 % IV SOLN
Freq: Once | INTRAVENOUS | Status: AC
  Administered 2015-01-13: 15:00:00 via INTRAVENOUS

## 2015-01-13 NOTE — Telephone Encounter (Signed)
Per staff message and POF I have scheduled appts. Advised scheduler of appts. JMW  

## 2015-01-13 NOTE — Progress Notes (Signed)
Nutrition follow-up completed with patient who is distressed with her weight loss. Weight documented as 111.7 pounds July 26 down from 115 pounds July 20. Patient states she thought she was eating better.  She continues to complain of poor appetite    Nutrition diagnosis: Unintended weight loss continues.  Intervention:  Encouraged patient to continue strategies for increased oral intake throughout the day. Suggested patient could try a milk shake for additional calories and protein since she does not tolerate oral nutrition supplements. Teach back method used.  Monitoring, evaluation, goals: Patient will work to continue to increase calories and protein to minimize weight loss.  Next Visit:  Patient will contact me with questions or concerns.  **Disclaimer: This note was dictated with voice recognition software. Similar sounding words can inadvertently be transcribed and this note may contain transcription errors which may not have been corrected upon publication of note.**

## 2015-01-13 NOTE — Progress Notes (Signed)
[facility] [phone]   [phone]  OFFICE PROGRESS NOTE  No primary care provider on file. No primary provider on file.  DIAGNOSIS:  1) Metastatic non-small cell lung cancer, squamous cell carcinoma initially diagnosed as a stage IIIB diagnosed in March 2015. 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  PRIOR THERAPY:  1) Status post course of concurrent chemoradiation with weekly carboplatin and paclitaxel but the patient did not receive any consolidation chemotherapy.  CURRENT THERAPY: Immunotherapy with Nivolumab 3 MG/KG every 2 weeks. First dose 11/18/2014. Status post 4 cycles.   INTERVAL HISTORY: [name] 49 y.o. female returns to the clinic today for follow-up visit accompanied by her brother. The patient is feeling fine today with no specific complaints except for the persistent weight loss and fatigue. Her left-sided chest pain is well controlled with her current pain medication was fentanyl patch 100 g/hour every 3 days, MS Contin 45 mg by mouth every 12 hours in addition to MSIR IR for breakthrough pain. She takes around 4 tablets of MSIR per day. She continues to have mild shortness of breath as well as mild cough but no hemoptysis. She has no nausea or vomiting, no fever or chills. She is tolerating her treatment with Nivolumab fairly well. Symptomatically the patient has been feeling well since she started the immunotherapy. She had repeat CT scan of the chest, abdomen and pelvis performed recently and she is here for evaluation and discussion of her scan results.  MEDICAL HISTORY: Past Medical History  Diagnosis Date  . Cancer     left bronchioles  . Radiation 11/24/14-12/05/14    Left ribs 30 Gy in 10 fractions    ALLERGIES:  is allergic to penicillins.  MEDICATIONS:  Current Outpatient Prescriptions  Medication Sig Dispense Refill  . acetaminophen (TYLENOL) 325 MG  tablet Take 2 tablets (650 mg total) by mouth every 6 (six) hours as needed for mild pain (or Fever >/= 101).    . DULoxetine (CYMBALTA) 30 MG capsule Take 30 mg by mouth.    . enoxaparin (LOVENOX) 80 MG/0.8ML injection ADMINISTER 0.8 ML UNDER THE SKIN DAILY 30 Syringe 0  . fentaNYL (DURAGESIC - DOSED MCG/HR) 100 MCG/HR Place 1 patch (100 mcg total) onto the skin every 3 (three) days. 5 patch 0  . ipratropium (ATROVENT) 0.02 % nebulizer solution Take 2.5 mLs (0.5 mg total) by nebulization 3 (three) times daily. 75 mL 0  . ketorolac (TORADOL) 10 MG tablet TK 1 T PO Q 6 H FOR 5 DAYS  0  . LORazepam (ATIVAN) 1 MG tablet Take 1 tablet (1 mg total) by mouth every 6 (six) hours as needed for anxiety. (Patient not taking: Reported on 01/07/2015) 30 tablet 0  . morphine (MS CONTIN) 15 MG 12 hr tablet Take 1 tablet (15 mg total) by mouth 3 (three) times daily. 90 tablet 0  . morphine (MS CONTIN) 30 MG 12 hr tablet Take 1 tablet (30 mg total) by mouth every 8 (eight) hours. 90 tablet 0  . morphine (MSIR) 15 MG tablet Take 2 tablets (30 mg total) by mouth every 4 (four) hours as needed for severe pain. 90 tablet 0  . omeprazole (PRILOSEC) 20 MG capsule Take 1 capsule (20 mg total) by mouth 2 (two) times daily before a meal. 60 capsule 1  . ondansetron (ZOFRAN) 8 MG tablet TAKE 1 TABLET(8 MG) BY MOUTH EVERY 8 HOURS AS NEEDED  FOR NAUSEA OR VOMITING 20 tablet 0  . polyethylene glycol (MIRALAX / GLYCOLAX) packet Take 17 g by mouth 3 (three) times daily. May decrease to twice daily if you have frequent bowel movements. 120 each 2  . senna-docusate (SENOKOT-S) 8.6-50 MG per tablet Take 1 tablet by mouth at bedtime. 30 tablet 2   No current facility-administered medications for this visit.    SURGICAL HISTORY:  Past Surgical History  Procedure Laterality Date  . Esophagogastroduodenoscopy N/A 09/14/2014    Procedure: ESOPHAGOGASTRODUODENOSCOPY (EGD);  Surgeon: Teena Irani, MD;  Location: [facility];  Service:  Endoscopy;  Laterality: N/A;    REVIEW OF SYSTEMS:  Constitutional: positive for anorexia, fatigue and weight loss Eyes: negative Ears, nose, mouth, throat, and face: negative Respiratory: positive for cough and dyspnea on exertion Cardiovascular: negative Gastrointestinal: negative Genitourinary:negative Integument/breast: negative Hematologic/lymphatic: negative Musculoskeletal:negative Neurological: negative Behavioral/Psych: negative Endocrine: negative Allergic/Immunologic: negative   PHYSICAL EXAMINATION: General appearance: alert, cooperative, fatigued and no distress Head: Normocephalic, without obvious abnormality, atraumatic Neck: no adenopathy, no JVD, supple, symmetrical, trachea midline and thyroid not enlarged, symmetric, no tenderness/mass/nodules Lymph nodes: Cervical, supraclavicular, and axillary nodes normal. Resp: clear to auscultation bilaterally Back: symmetric, no curvature. ROM normal. No CVA tenderness. Cardio: regular rate and rhythm, S1, S2 normal, no murmur, click, rub or gallop GI: soft, non-tender; bowel sounds normal; no masses,  no organomegaly Extremities: extremities normal, atraumatic, no cyanosis or edema Neurologic: Alert and oriented X 3, normal strength and tone. Normal symmetric reflexes. Normal coordination and gait  ECOG PERFORMANCE STATUS: 2 - Symptomatic, <50% confined to bed  Blood pressure 102/67, pulse 103, temperature 98.3 F (36.8 C), temperature source Oral, resp. rate 17, height '5\' 7"'$  (1.702 m), weight 111 lb 11.2 oz (50.667 kg), SpO2 94 %.  LABORATORY DATA: Lab Results  Component Value Date   WBC 11.0* 01/13/2015   HGB 12.1 01/13/2015   HCT 37.3 01/13/2015   MCV 77.9* 01/13/2015   PLT 593* 01/13/2015      Chemistry      Component Value Date/Time   NA 133* 12/30/2014 1107   NA 131* 09/29/2014 0540   K 4.1 12/30/2014 1107   K 5.1 09/29/2014 0540   CL 102 09/29/2014 0540   CO2 23 12/30/2014 1107   CO2 22 09/29/2014  0540   BUN 6.4* 12/30/2014 1107   BUN 21 09/29/2014 0540   CREATININE 0.6 12/30/2014 1107   CREATININE 0.56 09/29/2014 0540      Component Value Date/Time   CALCIUM 9.6 12/30/2014 1107   CALCIUM 9.5 09/29/2014 0540   ALKPHOS 133 12/30/2014 1107   ALKPHOS 81 09/27/2014 0550   AST 9 12/30/2014 1107   AST 14 09/27/2014 0550   ALT <6 12/30/2014 1107   ALT 6 09/27/2014 0550   BILITOT 0.29 12/30/2014 1107   BILITOT 0.7 09/27/2014 0550       RADIOGRAPHIC STUDIES: Ct Abdomen Pelvis Wo Contrast  01/07/2015   CLINICAL DATA:  Subsequent evaluation of a 49 year old female with history of non-small-cell lung cancer. Restaging examination.  EXAM: CT CHEST, ABDOMEN AND PELVIS WITHOUT CONTRAST  TECHNIQUE: Multidetector CT imaging of the chest, abdomen and pelvis was performed following the standard protocol without IV contrast.  COMPARISON:  PET-CT 10/22/2014. CT the abdomen and pelvis 09/28/2014. CT of the chest 09/18/2014.  FINDINGS: CT CHEST FINDINGS  Mediastinum/Lymph Nodes: Heart size is normal. Slight leftward shift of cardiomediastinal structures related to left-sided volume loss. Small amount of pericardial fluid and/or thickening, increased compared to the  prior study, but unlikely to be of hemodynamic significance at this time. No associated pericardial calcification. Mild ectasia of the ascending thoracic aorta (4 cm in diameter). Mild atherosclerotic calcifications in the thoracic aorta. Prominent soft tissue throughout the left hilar region, poorly evaluated on today's noncontrast CT, likely to reflect either pleural malignancy or malignant left hilar lymphadenopathy. No other definite mediastinal or right hilar lymphadenopathy on today's noncontrast CT examination. Esophagus is unremarkable in appearance. No axillary lymphadenopathy.  Lungs/Pleura: Complete atelectasis and consolidation of the left lung with extensive left-sided volume loss, and moderate left chronic pleural effusion. Multifocal  left pleural thickening, poorly evaluated on some days noncontrast CT examination, but compatible with progressive metastatic disease to the pleura. Specific examples include a 2 x 3.6 cm pleural mass posteriorly on the left side (image 49 of series 2), and a posterior left-sided pleural mass measuring approximately 6.0 x 2.6 cm, with extension into the overlying chest wall and associated pathologic fracture of the posterior aspect of the left ninth rib (Image 40 of series 2) which is mildly displaced. Previously described right lower lobe lesion is slightly smaller than the prior examination, currently measuring 2.1 x 1.2 cm. Additional pleural-based nodule in the right lower lobe (image 57 of series 4) also appears slightly smaller, currently measuring 11 x 6 mm. Mild diffuse bronchial wall thickening with moderate centrilobular emphysema throughout the right lung.  Musculoskeletal/Soft Tissues: Direct osseous invasion of the posterior left ninth rib with mildly displaced pathologic fracture, as above. In addition, large lytic lesion in the lateral aspect of the left eighth rib, with extension into the overlying soft tissues, estimated to measure approximately 2.9 x 3.4 cm (image 46 of series 4).  CT ABDOMEN AND PELVIS FINDINGS  Hepatobiliary: No definite cystic or solid hepatic lesion identified on today's noncontrast CT examination. The unenhanced appearance of the gallbladder is normal.  Pancreas: No pancreatic mass or peripancreatic inflammatory changes on today's noncontrast CT examination.  Spleen: Unremarkable.  Adrenals/Urinary Tract: The unenhanced appearance of the kidneys and bilateral adrenal glands is normal. No hydroureteronephrosis. Urinary bladder is nearly completely decompressed, but otherwise unremarkable in appearance.  Stomach/Bowel: The unenhanced appearance of the stomach is normal. No pathologic dilatation of small bowel or colon. Normal appendix.  Vascular/Lymphatic: Atherosclerosis  throughout the abdominal and pelvic vasculature, without definite aneurysm. No lymphadenopathy noted in the abdomen or pelvis on today's noncontrast CT examination.  Reproductive: Uterus is retroverted.  Ovaries are atrophic.  Other: No significant volume of ascites.  No pneumoperitoneum.  Musculoskeletal: There are no aggressive appearing lytic or blastic lesions noted in the visualized portions of the skeleton.  IMPRESSION: 1. 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 In addition, there is increasing pericardial fluid and/or thickening, which could indicate early pericardial involvement. 2. Small nodules in the right lower lobe appears slightly smaller than the prior study, which could indicate that they are in fact infectious or inflammatory in etiology, or could indicate that they have responded to therapy. 3. Additional incidental findings, as above.   Electronically Signed   By: Vinnie Langton M.D.   On: 01/07/2015 15:07   Ct Chest Wo Contrast  01/07/2015   CLINICAL DATA:  Subsequent evaluation of a 49 year old female with history of non-small-cell  lung cancer. Restaging examination.  EXAM: CT CHEST, ABDOMEN AND PELVIS WITHOUT CONTRAST  TECHNIQUE: Multidetector CT imaging of the chest, abdomen and pelvis was performed following the standard protocol without IV contrast.  COMPARISON:  PET-CT 10/22/2014. CT the abdomen and pelvis 09/28/2014. CT of the chest 09/18/2014.  FINDINGS: CT CHEST FINDINGS  Mediastinum/Lymph Nodes: Heart size is normal. Slight leftward shift of cardiomediastinal structures related to left-sided volume loss. Small amount of pericardial fluid and/or  thickening, increased compared to the prior study, but unlikely to be of hemodynamic significance at this time. No associated pericardial calcification. Mild ectasia of the ascending thoracic aorta (4 cm in diameter). Mild atherosclerotic calcifications in the thoracic aorta. Prominent soft tissue throughout the left hilar region, poorly evaluated on today's noncontrast CT, likely to reflect either pleural malignancy or malignant left hilar lymphadenopathy. No other definite mediastinal or right hilar lymphadenopathy on today's noncontrast CT examination. Esophagus is unremarkable in appearance. No axillary lymphadenopathy.  Lungs/Pleura: Complete atelectasis and consolidation of the left lung with extensive left-sided volume loss, and moderate left chronic pleural effusion. Multifocal left pleural thickening, poorly evaluated on some days noncontrast CT examination, but compatible with progressive metastatic disease to the pleura. Specific examples include a 2 x 3.6 cm pleural mass posteriorly on the left side (image 49 of series 2), and a posterior left-sided pleural mass measuring approximately 6.0 x 2.6 cm, with extension into the overlying chest wall and associated pathologic fracture of the posterior aspect of the left ninth rib (Image 40 of series 2) which is mildly displaced. Previously described right lower lobe lesion is slightly smaller than the prior examination, currently measuring 2.1 x 1.2 cm. Additional pleural-based nodule in the right lower lobe (image 57 of series 4) also appears slightly smaller, currently measuring 11 x 6 mm. Mild diffuse bronchial wall thickening with moderate centrilobular emphysema throughout the right lung.  Musculoskeletal/Soft Tissues: Direct osseous invasion of the posterior left ninth rib with mildly displaced pathologic fracture, as above. In addition, large lytic lesion in the lateral aspect of the left eighth rib, with extension into the overlying soft tissues,  estimated to measure approximately 2.9 x 3.4 cm (image 46 of series 4).  CT ABDOMEN AND PELVIS FINDINGS  Hepatobiliary: No definite cystic or solid hepatic lesion identified on today's noncontrast CT examination. The unenhanced appearance of the gallbladder is normal.  Pancreas: No pancreatic mass or peripancreatic inflammatory changes on today's noncontrast CT examination.  Spleen: Unremarkable.  Adrenals/Urinary Tract: The unenhanced appearance of the kidneys and bilateral adrenal glands is normal. No hydroureteronephrosis. Urinary bladder is nearly completely decompressed, but otherwise unremarkable in appearance.  Stomach/Bowel: The unenhanced appearance of the stomach is normal. No pathologic dilatation of small bowel or colon. Normal appendix.  Vascular/Lymphatic: Atherosclerosis throughout the abdominal and pelvic vasculature, without definite aneurysm. No lymphadenopathy noted in the abdomen or pelvis on today's noncontrast CT examination.  Reproductive: Uterus is retroverted.  Ovaries are atrophic.  Other: No significant volume of ascites.  No pneumoperitoneum.  Musculoskeletal: There are no aggressive appearing lytic or blastic lesions noted in the visualized portions of the skeleton.  IMPRESSION: 1. 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 In addition, there is increasing pericardial fluid and/or thickening, which could indicate early pericardial  involvement. 2. Small nodules in the right lower lobe appears slightly smaller than the prior study, which could indicate that they are in fact infectious or inflammatory in etiology, or could indicate that they have responded to  therapy. 3. Additional incidental findings, as above.   Electronically Signed   By: Vinnie Langton M.D.   On: 01/07/2015 15:07    ASSESSMENT AND PLAN: This is a very pleasant 49 years old white female with metastatic non-small cell lung cancer, squamous cell carcinoma status post a course of concurrent chemoradiation as well as palliative radiotherapy to the left side of the chest. The patient is currently undergoing treatment with immunotherapy with Nivolumab status post 4 cycles. She is tolerating her treatment fairly well with no significant adverse effects. The patient has improvement in her symptoms since starting the immunotherapy. Unfortunately the recent CT scan of the chest showed questionable disease progression but this was done without contrast. I discussed the scan results with the patient and her brother. I gave her the option of continuing immunotherapy with Nivolumab source 3 more rounds before repeating CT scan for restaging of her disease especially she is symptomatically feeling well and there could be concerning for pseudo-progression especially after the palliative radiotherapy to that area with increased consolidation and radiation effects. I also with the patient the option of discontinuing immunotherapy and treatment with other systemic chemotherapy options including docetaxel/Cyramza or single agent gemcitabine. After discussion of all the options, the patient and her brother would like to continue with immunotherapy for now. She will proceed with cycle #5 today as scheduled. She will come back for follow-up visit in 2 weeks for reevaluation with the next cycle of her treatment. For pain management the patient will continue on treatment with fentanyl patch, MS Contin as well as MSIR. She was given a refill of the fentanyl patch today. For the weight loss, I strongly encouraged the patient to increase her oral intake. The patient was advised to call immediately if she has any  concerning symptoms in the interval. The patient voices understanding of current disease status and treatment options and is in agreement with the current care plan.  All questions were answered. The patient knows to call the clinic with any problems, questions or concerns. We can certainly see the patient much sooner if necessary.  I spent 15 minutes counseling the patient face to face. The total time spent in the appointment was 25 minutes.  Disclaimer: This note was dictated with voice recognition software. Similar sounding words can inadvertently be transcribed and may not be corrected upon review.

## 2015-01-13 NOTE — Patient Instructions (Signed)
[facility] Discharge Instructions for Patients Receiving Chemotherapy  Today you received the following chemotherapy agents: Nivolumab  To help prevent nausea and vomiting after your treatment, we encourage you to take your nausea medication as prescribed by your physician.    If you develop nausea and vomiting that is not controlled by your nausea medication, call the clinic.   BELOW ARE SYMPTOMS THAT SHOULD BE REPORTED IMMEDIATELY:  *FEVER GREATER THAN 100.5 F  *CHILLS WITH OR WITHOUT FEVER  NAUSEA AND VOMITING THAT IS NOT CONTROLLED WITH YOUR NAUSEA MEDICATION  *UNUSUAL SHORTNESS OF BREATH  *UNUSUAL BRUISING OR BLEEDING  TENDERNESS IN MOUTH AND THROAT WITH OR WITHOUT PRESENCE OF ULCERS  *URINARY PROBLEMS  *BOWEL PROBLEMS  UNUSUAL RASH Items with * indicate a potential emergency and should be followed up as soon as possible.  Feel free to call the clinic you have any questions or concerns. The clinic phone number is [phone].  Please show the CHEMO ALERT CARD at check-in to the Emergency Department and triage nurse.   

## 2015-01-13 NOTE — Telephone Encounter (Signed)
per pof to sch pt appt-sent MW email to sch pt trmt-will call pt once reply

## 2015-01-13 NOTE — Telephone Encounter (Signed)
cld pt & left a message to adv of mext appt on 8/8

## 2015-01-24 ENCOUNTER — Other Ambulatory Visit: Payer: Self-pay | Admitting: Oncology

## 2015-01-26 ENCOUNTER — Other Ambulatory Visit (HOSPITAL_BASED_OUTPATIENT_CLINIC_OR_DEPARTMENT_OTHER): Payer: 59

## 2015-01-26 ENCOUNTER — Telehealth: Payer: Self-pay | Admitting: Internal Medicine

## 2015-01-26 ENCOUNTER — Ambulatory Visit (HOSPITAL_BASED_OUTPATIENT_CLINIC_OR_DEPARTMENT_OTHER): Payer: 59

## 2015-01-26 ENCOUNTER — Encounter: Payer: Self-pay | Admitting: Oncology

## 2015-01-26 ENCOUNTER — Ambulatory Visit (HOSPITAL_BASED_OUTPATIENT_CLINIC_OR_DEPARTMENT_OTHER): Payer: 59 | Admitting: Oncology

## 2015-01-26 VITALS — BP 124/84 | HR 106 | Temp 98.7°F | Resp 18 | Ht 67.0 in | Wt 114.4 lb

## 2015-01-26 DIAGNOSIS — Z5112 Encounter for antineoplastic immunotherapy: Secondary | ICD-10-CM | POA: Diagnosis not present

## 2015-01-26 DIAGNOSIS — R52 Pain, unspecified: Secondary | ICD-10-CM

## 2015-01-26 DIAGNOSIS — C782 Secondary malignant neoplasm of pleura: Secondary | ICD-10-CM

## 2015-01-26 DIAGNOSIS — C3492 Malignant neoplasm of unspecified part of left bronchus or lung: Secondary | ICD-10-CM

## 2015-01-26 LAB — CBC WITH DIFFERENTIAL/PLATELET
BASO%: 0.4 % (ref 0.0–2.0)
Basophils Absolute: 0 10*3/uL (ref 0.0–0.1)
EOS%: 3.3 % (ref 0.0–7.0)
Eosinophils Absolute: 0.3 10*3/uL (ref 0.0–0.5)
HEMATOCRIT: 33.3 % — AB (ref 34.8–46.6)
HGB: 10.7 g/dL — ABNORMAL LOW (ref 11.6–15.9)
LYMPH%: 2.4 % — AB (ref 14.0–49.7)
MCH: 24.7 pg — ABNORMAL LOW (ref 25.1–34.0)
MCHC: 32.2 g/dL (ref 31.5–36.0)
MCV: 76.6 fL — ABNORMAL LOW (ref 79.5–101.0)
MONO#: 0.7 10*3/uL (ref 0.1–0.9)
MONO%: 7.8 % (ref 0.0–14.0)
NEUT#: 8.2 10*3/uL — ABNORMAL HIGH (ref 1.5–6.5)
NEUT%: 86.1 % — AB (ref 38.4–76.8)
Platelets: 835 10*3/uL — ABNORMAL HIGH (ref 145–400)
RBC: 4.35 10*6/uL (ref 3.70–5.45)
RDW: 19 % — ABNORMAL HIGH (ref 11.2–14.5)
WBC: 9.5 10*3/uL (ref 3.9–10.3)
lymph#: 0.2 10*3/uL — ABNORMAL LOW (ref 0.9–3.3)

## 2015-01-26 LAB — COMPREHENSIVE METABOLIC PANEL (CC13)
ALK PHOS: 158 U/L — AB (ref 40–150)
ALT: <6 U/L (ref 0–55)
Albumin: 2.2 g/dL — ABNORMAL LOW (ref 3.5–5.0)
Anion Gap: 9 mEq/L (ref 3–11)
BUN: 5.5 mg/dL — AB (ref 7.0–26.0)
CO2: 27 meq/L (ref 22–29)
CREATININE: 0.6 mg/dL (ref 0.6–1.1)
Calcium: 9.4 mg/dL (ref 8.4–10.4)
Chloride: 98 mEq/L (ref 98–109)
EGFR: >90 mL/min/{1.73_m2} (ref >90)
Glucose: 155 mg/dl — ABNORMAL HIGH (ref 70–140)
Potassium: 3.4 mEq/L — ABNORMAL LOW (ref 3.5–5.1)
SODIUM: 134 meq/L — AB (ref 136–145)
Total Bilirubin: 0.24 mg/dL (ref 0.20–1.20)
Total Protein: 5.9 g/dL — ABNORMAL LOW (ref 6.4–8.3)

## 2015-01-26 MED ORDER — MORPHINE SULFATE 15 MG PO TABS
ORAL_TABLET | ORAL | Status: AC
  Filled 2015-01-26: qty 1

## 2015-01-26 MED ORDER — FERROUS SULFATE 325 (65 FE) MG PO TBEC: 325.0000 mg | DELAYED_RELEASE_TABLET | Freq: Two times a day (BID) | ORAL | Status: AC

## 2015-01-26 MED ORDER — MORPHINE SULFATE ER 15 MG PO TBCR: 15.0000 mg | EXTENDED_RELEASE_TABLET | Freq: Three times a day (TID) | ORAL | Status: DC

## 2015-01-26 MED ORDER — SODIUM CHLORIDE 0.9 % IV SOLN
Freq: Once | INTRAVENOUS | Status: AC
  Administered 2015-01-26: 16:00:00 via INTRAVENOUS

## 2015-01-26 MED ORDER — MORPHINE SULFATE 15 MG PO TABS
15.0000 mg | ORAL_TABLET | Freq: Once | ORAL | Status: AC
  Administered 2015-01-26: 15 mg via ORAL

## 2015-01-26 MED ORDER — MORPHINE SULFATE 15 MG PO TABS: 30.0000 mg | ORAL_TABLET | ORAL | Status: DC | PRN

## 2015-01-26 MED ORDER — MORPHINE SULFATE ER 30 MG PO TBCR: 30.0000 mg | EXTENDED_RELEASE_TABLET | Freq: Three times a day (TID) | ORAL | Status: DC

## 2015-01-26 MED ORDER — NIVOLUMAB CHEMO INJECTION 100 MG/10ML
3.0000 mg/kg | Freq: Once | INTRAVENOUS | Status: AC
  Administered 2015-01-26: 160 mg via INTRAVENOUS
  Filled 2015-01-26: qty 16

## 2015-01-26 NOTE — Telephone Encounter (Signed)
Gave adn printed appt sched and avs for pt for Aug adn Sept

## 2015-01-26 NOTE — Progress Notes (Signed)
[facility] [phone]   [phone]  OFFICE PROGRESS NOTE  No primary care provider on file. No primary provider on file.  DIAGNOSIS:  1) Metastatic non-small cell lung cancer, squamous cell carcinoma initially diagnosed as a stage IIIB diagnosed in March 2015. 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  PRIOR THERAPY:  1) Status post course of concurrent chemoradiation with weekly carboplatin and paclitaxel but the patient did not receive any consolidation chemotherapy.  CURRENT THERAPY: Immunotherapy with Nivolumab 3 MG/KG every 2 weeks. First dose 11/18/2014. Status post 5 cycles.   INTERVAL HISTORY: [name] 49 y.o. female returns to the clinic today for follow-up visit accompanied by her brother. The patient is feeling fine today with no specific complaints.Eating better and has gained 3 lbs. Her left-sided chest pain is well controlled with her current pain medication was fentanyl patch 100 g/hour every 3 days, MS Contin 45 mg by mouth every 8 hours in addition to MSIR IR for breakthrough pain. She takes around 2 tablets of MSIR per day. She continues to have mild shortness of breath as well as mild cough but no hemoptysis. She has no nausea or vomiting, no fever or chills. She is tolerating her treatment with Nivolumab fairly well. Symptomatically the patient has been feeling well since she started the immunotherapy.   MEDICAL HISTORY: Past Medical History  Diagnosis Date  . Cancer     left bronchioles  . Radiation 11/24/14-12/05/14    Left ribs 30 Gy in 10 fractions    ALLERGIES:  is allergic to penicillins.  MEDICATIONS:  Current Outpatient Prescriptions  Medication Sig Dispense Refill  . acetaminophen (TYLENOL) 325 MG tablet Take 2 tablets (650 mg total) by mouth every 6 (six) hours as needed for mild pain (or Fever >/= 101).    Marland Kitchen diclofenac (VOLTAREN) 50 MG EC tablet TK  1 T PO BID  0  . DULoxetine (CYMBALTA) 30 MG capsule Take 30 mg by mouth.    . fentaNYL (DURAGESIC - DOSED MCG/HR) 100 MCG/HR Place 1 patch (100 mcg total) onto the skin every 3 (three) days. 5 patch 0  . ipratropium (ATROVENT) 0.02 % nebulizer solution Take 2.5 mLs (0.5 mg total) by nebulization 3 (three) times daily. 75 mL 0  . LORazepam (ATIVAN) 1 MG tablet Take 1 tablet (1 mg total) by mouth every 6 (six) hours as needed for anxiety. 30 tablet 0  . morphine (MS CONTIN) 15 MG 12 hr tablet Take 1 tablet (15 mg total) by mouth 3 (three) times daily. 90 tablet 0  . morphine (MS CONTIN) 30 MG 12 hr tablet Take 1 tablet (30 mg total) by mouth every 8 (eight) hours. 90 tablet 0  . morphine (MSIR) 15 MG tablet Take 2 tablets (30 mg total) by mouth every 4 (four) hours as needed for severe pain. 90 tablet 0  . omeprazole (PRILOSEC) 20 MG capsule Take 1 capsule (20 mg total) by mouth 2 (two) times daily before a meal. 60 capsule 1  . ondansetron (ZOFRAN) 8 MG tablet TAKE 1 TABLET(8 MG) BY MOUTH EVERY 8 HOURS AS NEEDED FOR NAUSEA OR VOMITING 20 tablet 0  . polyethylene glycol (MIRALAX / GLYCOLAX) packet Take 17 g by mouth 3 (three) times daily. May decrease to twice daily if you have frequent bowel movements. 120 each 2  . senna-docusate (SENOKOT-S) 8.6-50 MG per tablet Take 1 tablet by mouth at bedtime.  30 tablet 2  . enoxaparin (LOVENOX) 80 MG/0.8ML injection ADMINISTER 0.8 ML UNDER THE SKIN DAILY 24 mL 0  . ferrous sulfate 325 (65 FE) MG EC tablet Take 1 tablet (325 mg total) by mouth 2 (two) times daily with a meal. 60 tablet 1   No current facility-administered medications for this visit.    SURGICAL HISTORY:  Past Surgical History  Procedure Laterality Date  . Esophagogastroduodenoscopy N/A 09/14/2014    Procedure: ESOPHAGOGASTRODUODENOSCOPY (EGD);  Surgeon: Teena Irani, MD;  Location: [facility];  Service: Endoscopy;  Laterality: N/A;    REVIEW OF SYSTEMS:  Constitutional: positive for  anorexia, fatigue and weight loss Eyes: negative Ears, nose, mouth, throat, and face: negative Respiratory: positive for cough and dyspnea on exertion Cardiovascular: negative Gastrointestinal: negative Genitourinary:negative Integument/breast: negative Hematologic/lymphatic: negative Musculoskeletal:negative Neurological: negative Behavioral/Psych: negative Endocrine: negative Allergic/Immunologic: negative   PHYSICAL EXAMINATION: General appearance: alert, cooperative, fatigued and no distress Head: Normocephalic, without obvious abnormality, atraumatic Neck: no adenopathy, no JVD, supple, symmetrical, trachea midline and thyroid not enlarged, symmetric, no tenderness/mass/nodules Lymph nodes: Cervical, supraclavicular, and axillary nodes normal. Resp: clear to auscultation bilaterally Back: symmetric, no curvature. ROM normal. No CVA tenderness. Cardio: regular rate and rhythm, S1, S2 normal, no murmur, click, rub or gallop GI: soft, non-tender; bowel sounds normal; no masses,  no organomegaly Extremities: extremities normal, atraumatic, no cyanosis or edema Neurologic: Alert and oriented X 3, normal strength and tone. Normal symmetric reflexes. Normal coordination and gait  ECOG PERFORMANCE STATUS: 2 - Symptomatic, <50% confined to bed  Blood pressure 124/84, pulse 106, temperature 98.7 F (37.1 C), temperature source Oral, resp. rate 18, height '5\' 7"'$  (1.702 m), weight 114 lb 6.4 oz (51.891 kg), SpO2 97 %.  LABORATORY DATA: Lab Results  Component Value Date   WBC 9.5 01/26/2015   HGB 10.7* 01/26/2015   HCT 33.3* 01/26/2015   MCV 76.6* 01/26/2015   PLT 835* 01/26/2015      Chemistry      Component Value Date/Time   NA 134* 01/26/2015 1302   NA 131* 09/29/2014 0540   K 3.4* 01/26/2015 1302   K 5.1 09/29/2014 0540   CL 102 09/29/2014 0540   CO2 27 01/26/2015 1302   CO2 22 09/29/2014 0540   BUN 5.5* 01/26/2015 1302   BUN 21 09/29/2014 0540   CREATININE 0.6  01/26/2015 1302   CREATININE 0.56 09/29/2014 0540      Component Value Date/Time   CALCIUM 9.4 01/26/2015 1302   CALCIUM 9.5 09/29/2014 0540   ALKPHOS 158* 01/26/2015 1302   ALKPHOS 81 09/27/2014 0550   AST <7 01/26/2015 1302   AST 14 09/27/2014 0550   ALT <6 01/26/2015 1302   ALT 6 09/27/2014 0550   BILITOT 0.24 01/26/2015 1302   BILITOT 0.7 09/27/2014 0550       RADIOGRAPHIC STUDIES: Ct Abdomen Pelvis Wo Contrast  01/07/2015   CLINICAL DATA:  Subsequent evaluation of a 49 year old female with history of non-small-cell lung cancer. Restaging examination.  EXAM: CT CHEST, ABDOMEN AND PELVIS WITHOUT CONTRAST  TECHNIQUE: Multidetector CT imaging of the chest, abdomen and pelvis was performed following the standard protocol without IV contrast.  COMPARISON:  PET-CT 10/22/2014. CT the abdomen and pelvis 09/28/2014. CT of the chest 09/18/2014.  FINDINGS: CT CHEST FINDINGS  Mediastinum/Lymph Nodes: Heart size is normal. Slight leftward shift of cardiomediastinal structures related to left-sided volume loss. Small amount of pericardial fluid and/or thickening, increased compared to the prior study, but unlikely to be of hemodynamic  significance at this time. No associated pericardial calcification. Mild ectasia of the ascending thoracic aorta (4 cm in diameter). Mild atherosclerotic calcifications in the thoracic aorta. Prominent soft tissue throughout the left hilar region, poorly evaluated on today's noncontrast CT, likely to reflect either pleural malignancy or malignant left hilar lymphadenopathy. No other definite mediastinal or right hilar lymphadenopathy on today's noncontrast CT examination. Esophagus is unremarkable in appearance. No axillary lymphadenopathy.  Lungs/Pleura: Complete atelectasis and consolidation of the left lung with extensive left-sided volume loss, and moderate left chronic pleural effusion. Multifocal left pleural thickening, poorly evaluated on some days noncontrast CT  examination, but compatible with progressive metastatic disease to the pleura. Specific examples include a 2 x 3.6 cm pleural mass posteriorly on the left side (image 49 of series 2), and a posterior left-sided pleural mass measuring approximately 6.0 x 2.6 cm, with extension into the overlying chest wall and associated pathologic fracture of the posterior aspect of the left ninth rib (Image 40 of series 2) which is mildly displaced. Previously described right lower lobe lesion is slightly smaller than the prior examination, currently measuring 2.1 x 1.2 cm. Additional pleural-based nodule in the right lower lobe (image 57 of series 4) also appears slightly smaller, currently measuring 11 x 6 mm. Mild diffuse bronchial wall thickening with moderate centrilobular emphysema throughout the right lung.  Musculoskeletal/Soft Tissues: Direct osseous invasion of the posterior left ninth rib with mildly displaced pathologic fracture, as above. In addition, large lytic lesion in the lateral aspect of the left eighth rib, with extension into the overlying soft tissues, estimated to measure approximately 2.9 x 3.4 cm (image 46 of series 4).  CT ABDOMEN AND PELVIS FINDINGS  Hepatobiliary: No definite cystic or solid hepatic lesion identified on today's noncontrast CT examination. The unenhanced appearance of the gallbladder is normal.  Pancreas: No pancreatic mass or peripancreatic inflammatory changes on today's noncontrast CT examination.  Spleen: Unremarkable.  Adrenals/Urinary Tract: The unenhanced appearance of the kidneys and bilateral adrenal glands is normal. No hydroureteronephrosis. Urinary bladder is nearly completely decompressed, but otherwise unremarkable in appearance.  Stomach/Bowel: The unenhanced appearance of the stomach is normal. No pathologic dilatation of small bowel or colon. Normal appendix.  Vascular/Lymphatic: Atherosclerosis throughout the abdominal and pelvic vasculature, without definite aneurysm.  No lymphadenopathy noted in the abdomen or pelvis on today's noncontrast CT examination.  Reproductive: Uterus is retroverted.  Ovaries are atrophic.  Other: No significant volume of ascites.  No pneumoperitoneum.  Musculoskeletal: There are no aggressive appearing lytic or blastic lesions noted in the visualized portions of the skeleton.  IMPRESSION: 1. 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 In addition, there is increasing pericardial fluid and/or thickening, which could indicate early pericardial involvement. 2. Small nodules in the right lower lobe appears slightly smaller than the prior study, which could indicate that they are in fact infectious or inflammatory in etiology, or could indicate that they have responded to therapy. 3. Additional incidental findings, as above.   Electronically Signed   By: Vinnie Langton M.D.   On: 01/07/2015 15:07   Ct Chest Wo Contrast  01/07/2015   CLINICAL DATA:  Subsequent evaluation of a 49 year old female with history of non-small-cell lung cancer. Restaging examination.  EXAM: CT CHEST,  ABDOMEN AND PELVIS WITHOUT CONTRAST  TECHNIQUE: Multidetector CT imaging of the chest, abdomen and pelvis was performed following the standard protocol without IV contrast.  COMPARISON:  PET-CT 10/22/2014. CT the abdomen and pelvis 09/28/2014. CT of the chest 09/18/2014.  FINDINGS: CT CHEST FINDINGS  Mediastinum/Lymph Nodes: Heart size is normal. Slight leftward shift of cardiomediastinal structures related to left-sided volume loss. Small amount of pericardial fluid and/or thickening, increased compared to the prior study, but unlikely to be of hemodynamic  significance at this time. No associated pericardial calcification. Mild ectasia of the ascending thoracic aorta (4 cm in diameter). Mild atherosclerotic calcifications in the thoracic aorta. Prominent soft tissue throughout the left hilar region, poorly evaluated on today's noncontrast CT, likely to reflect either pleural malignancy or malignant left hilar lymphadenopathy. No other definite mediastinal or right hilar lymphadenopathy on today's noncontrast CT examination. Esophagus is unremarkable in appearance. No axillary lymphadenopathy.  Lungs/Pleura: Complete atelectasis and consolidation of the left lung with extensive left-sided volume loss, and moderate left chronic pleural effusion. Multifocal left pleural thickening, poorly evaluated on some days noncontrast CT examination, but compatible with progressive metastatic disease to the pleura. Specific examples include a 2 x 3.6 cm pleural mass posteriorly on the left side (image 49 of series 2), and a posterior left-sided pleural mass measuring approximately 6.0 x 2.6 cm, with extension into the overlying chest wall and associated pathologic fracture of the posterior aspect of the left ninth rib (Image 40 of series 2) which is mildly displaced. Previously described right lower lobe lesion is slightly smaller than the prior examination, currently measuring 2.1 x 1.2 cm. Additional pleural-based nodule in the right lower lobe (image 57 of series 4) also appears slightly smaller, currently measuring 11 x 6 mm. Mild diffuse bronchial wall thickening with moderate centrilobular emphysema throughout the right lung.  Musculoskeletal/Soft Tissues: Direct osseous invasion of the posterior left ninth rib with mildly displaced pathologic fracture, as above. In addition, large lytic lesion in the lateral aspect of the left eighth rib, with extension into the overlying soft tissues, estimated to measure approximately 2.9 x 3.4 cm (image 46 of series 4).  CT ABDOMEN AND  PELVIS FINDINGS  Hepatobiliary: No definite cystic or solid hepatic lesion identified on today's noncontrast CT examination. The unenhanced appearance of the gallbladder is normal.  Pancreas: No pancreatic mass or peripancreatic inflammatory changes on today's noncontrast CT examination.  Spleen: Unremarkable.  Adrenals/Urinary Tract: The unenhanced appearance of the kidneys and bilateral adrenal glands is normal. No hydroureteronephrosis. Urinary bladder is nearly completely decompressed, but otherwise unremarkable in appearance.  Stomach/Bowel: The unenhanced appearance of the stomach is normal. No pathologic dilatation of small bowel or colon. Normal appendix.  Vascular/Lymphatic: Atherosclerosis throughout the abdominal and pelvic vasculature, without definite aneurysm. No lymphadenopathy noted in the abdomen or pelvis on today's noncontrast CT examination.  Reproductive: Uterus is retroverted.  Ovaries are atrophic.  Other: No significant volume of ascites.  No pneumoperitoneum.  Musculoskeletal: There are no aggressive appearing lytic or blastic lesions noted in the visualized portions of the skeleton.  IMPRESSION: 1. 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 In addition, there is increasing pericardial fluid and/or thickening, which could indicate early pericardial involvement. 2. Small nodules in the right lower  lobe appears slightly smaller than the prior study, which could indicate that they are in fact infectious or inflammatory in etiology, or could indicate that they have responded to therapy. 3. Additional incidental findings, as above.   Electronically Signed   By: Vinnie Langton M.D.   On: 01/07/2015 15:07    ASSESSMENT AND PLAN: This is a very pleasant 49 year old white female with metastatic non-small cell lung cancer, squamous cell carcinoma status post a course of concurrent chemoradiation as well as palliative radiotherapy to the left side of the chest. The patient is currently undergoing treatment with immunotherapy with Nivolumab status post 5 cycles. She is tolerating her treatment fairly well with no significant adverse effects. The patient has improvement in her symptoms since starting the immunotherapy. Unfortunately the recent CT scan of the chest showed questionable disease progression but this was done without contrast. The patient opted to continue Nivolumab instead of switching to chemo.  The patient was seen and discussed with  Dr. Julien Nordmann. She will proceed with cycle #6  of Nivolumab today as scheduled. She will come back for follow-up visit in 2 weeks for reevaluation with the next cycle of her treatment.  For pain management the patient will continue on treatment with fentanyl patch, MS Contin as well as MSIR. She was given a refill of the MS Contin and MSIR today.  Hemoglobin is noted to be drifting down with a low MCV. Patient will begin iron supplementations. Prescription was sent to her local pharmacy. Encouraged her to continue to take  MiraLax and stool softeners.  The patient voices understanding of current disease status and treatment options and is in agreement with the current care plan.  All questions were answered. The patient knows to call the clinic with any problems, questions or concerns. We can certainly see the patient much sooner if necessary.  Mikey Bussing, DNP, AGPCNP-BC, AOCNP   ADDENDUM: Hematology/Oncology Attending: I had a face to face encounter with the patient. I recommended her care plan. This is a very pleasant 49 years old white female with metastatic non-small cell lung cancer, squamous cell carcinoma was  currently undergoing treatment with immunotherapy with Nivolumab status post 5 cycles. The patient is tolerating her treatment well with no significant adverse effects. She denied having any nausea or vomiting, no diarrhea. Patient denied having any significant skin rash. She is currently trying to taper her pain medication but she still requiring high doses of narcotic including fentanyl patch, MS Contin and MSIR. We will proceed with cycle #6 today as scheduled. The patient would come back for follow-up visit in 2 weeks for reevaluation before starting cycle #7. She was advised to call immediately if she has any concerning symptoms in the interval.  Disclaimer: This note was dictated with voice recognition software. Similar sounding words can inadvertently be transcribed and may be missed upon review.  Eilleen Kempf., MD 01/28/2015

## 2015-02-09 ENCOUNTER — Other Ambulatory Visit: Payer: Self-pay | Admitting: Physician Assistant

## 2015-02-09 ENCOUNTER — Ambulatory Visit (HOSPITAL_BASED_OUTPATIENT_CLINIC_OR_DEPARTMENT_OTHER): Payer: 59 | Admitting: Physician Assistant

## 2015-02-09 ENCOUNTER — Ambulatory Visit (HOSPITAL_COMMUNITY)
Admission: RE | Admit: 2015-02-09 | Discharge: 2015-02-09 | Disposition: A | Discharge status: Home / Self Care | Payer: 59 | Source: Ambulatory Visit | Attending: Physician Assistant | Admitting: Physician Assistant

## 2015-02-09 ENCOUNTER — Inpatient Hospital Stay (HOSPITAL_COMMUNITY)
Admission: EM | Admit: 2015-02-09 | Discharge: 2015-02-14 | DRG: 469 | Disposition: A | Discharge status: Skilled Nursing Facility | Payer: 59 | Attending: Internal Medicine | Admitting: Internal Medicine

## 2015-02-09 ENCOUNTER — Ambulatory Visit: Payer: 59

## 2015-02-09 ENCOUNTER — Telehealth: Payer: Self-pay | Admitting: Internal Medicine

## 2015-02-09 ENCOUNTER — Other Ambulatory Visit (HOSPITAL_BASED_OUTPATIENT_CLINIC_OR_DEPARTMENT_OTHER): Payer: 59

## 2015-02-09 ENCOUNTER — Emergency Department (HOSPITAL_COMMUNITY): Payer: 59

## 2015-02-09 ENCOUNTER — Encounter: Payer: Self-pay | Admitting: Physician Assistant

## 2015-02-09 ENCOUNTER — Ambulatory Visit (HOSPITAL_BASED_OUTPATIENT_CLINIC_OR_DEPARTMENT_OTHER): Payer: 59

## 2015-02-09 ENCOUNTER — Encounter (HOSPITAL_COMMUNITY): Payer: Self-pay | Admitting: Emergency Medicine

## 2015-02-09 VITALS — BP 111/75 | HR 76 | Temp 97.6°F | Resp 18 | Ht 67.0 in

## 2015-02-09 DIAGNOSIS — R262 Difficulty in walking, not elsewhere classified: Secondary | ICD-10-CM | POA: Diagnosis present

## 2015-02-09 DIAGNOSIS — W19XXXA Unspecified fall, initial encounter: Secondary | ICD-10-CM | POA: Insufficient documentation

## 2015-02-09 DIAGNOSIS — C7802 Secondary malignant neoplasm of left lung: Secondary | ICD-10-CM | POA: Diagnosis not present

## 2015-02-09 DIAGNOSIS — S72012A Unspecified intracapsular fracture of left femur, initial encounter for closed fracture: Secondary | ICD-10-CM | POA: Diagnosis present

## 2015-02-09 DIAGNOSIS — C782 Secondary malignant neoplasm of pleura: Secondary | ICD-10-CM

## 2015-02-09 DIAGNOSIS — Z79899 Other long term (current) drug therapy: Secondary | ICD-10-CM

## 2015-02-09 DIAGNOSIS — Z681 Body mass index (BMI) 19 or less, adult: Secondary | ICD-10-CM | POA: Diagnosis not present

## 2015-02-09 DIAGNOSIS — C3492 Malignant neoplasm of unspecified part of left bronchus or lung: Secondary | ICD-10-CM | POA: Diagnosis not present

## 2015-02-09 DIAGNOSIS — I82210 Acute embolism and thrombosis of superior vena cava: Secondary | ICD-10-CM | POA: Diagnosis present

## 2015-02-09 DIAGNOSIS — R918 Other nonspecific abnormal finding of lung field: Secondary | ICD-10-CM | POA: Insufficient documentation

## 2015-02-09 DIAGNOSIS — C3490 Malignant neoplasm of unspecified part of unspecified bronchus or lung: Secondary | ICD-10-CM | POA: Diagnosis present

## 2015-02-09 DIAGNOSIS — D62 Acute posthemorrhagic anemia: Secondary | ICD-10-CM | POA: Diagnosis not present

## 2015-02-09 DIAGNOSIS — I82290 Acute embolism and thrombosis of other thoracic veins: Secondary | ICD-10-CM | POA: Diagnosis not present

## 2015-02-09 DIAGNOSIS — Z809 Family history of malignant neoplasm, unspecified: Secondary | ICD-10-CM

## 2015-02-09 DIAGNOSIS — D72829 Elevated white blood cell count, unspecified: Secondary | ICD-10-CM | POA: Diagnosis present

## 2015-02-09 DIAGNOSIS — C801 Malignant (primary) neoplasm, unspecified: Secondary | ICD-10-CM

## 2015-02-09 DIAGNOSIS — E871 Hypo-osmolality and hyponatremia: Secondary | ICD-10-CM | POA: Diagnosis not present

## 2015-02-09 DIAGNOSIS — Z96649 Presence of unspecified artificial hip joint: Secondary | ICD-10-CM

## 2015-02-09 DIAGNOSIS — Z7901 Long term (current) use of anticoagulants: Secondary | ICD-10-CM | POA: Diagnosis not present

## 2015-02-09 DIAGNOSIS — E43 Unspecified severe protein-calorie malnutrition: Secondary | ICD-10-CM | POA: Diagnosis present

## 2015-02-09 DIAGNOSIS — I82211 Chronic embolism and thrombosis of superior vena cava: Secondary | ICD-10-CM | POA: Diagnosis present

## 2015-02-09 DIAGNOSIS — E222 Syndrome of inappropriate secretion of antidiuretic hormone: Secondary | ICD-10-CM | POA: Diagnosis present

## 2015-02-09 DIAGNOSIS — S72002A Fracture of unspecified part of neck of left femur, initial encounter for closed fracture: Secondary | ICD-10-CM | POA: Diagnosis present

## 2015-02-09 DIAGNOSIS — W108XXA Fall (on) (from) other stairs and steps, initial encounter: Secondary | ICD-10-CM | POA: Diagnosis present

## 2015-02-09 DIAGNOSIS — Z9181 History of falling: Secondary | ICD-10-CM

## 2015-02-09 DIAGNOSIS — G893 Neoplasm related pain (acute) (chronic): Secondary | ICD-10-CM | POA: Diagnosis present

## 2015-02-09 DIAGNOSIS — Z5112 Encounter for antineoplastic immunotherapy: Secondary | ICD-10-CM

## 2015-02-09 DIAGNOSIS — Z87891 Personal history of nicotine dependence: Secondary | ICD-10-CM

## 2015-02-09 DIAGNOSIS — M25552 Pain in left hip: Secondary | ICD-10-CM | POA: Diagnosis present

## 2015-02-09 DIAGNOSIS — Z419 Encounter for procedure for purposes other than remedying health state, unspecified: Secondary | ICD-10-CM

## 2015-02-09 LAB — BASIC METABOLIC PANEL
Anion gap: 11 (ref 5–15)
BUN: 7 mg/dL (ref 6–20)
CALCIUM: 9.2 mg/dL (ref 8.9–10.3)
CO2: 25 mmol/L (ref 22–32)
CREATININE: 0.53 mg/dL (ref 0.44–1.00)
Chloride: 95 mmol/L — ABNORMAL LOW (ref 101–111)
GFR calc Af Amer: >60 mL/min (ref >60)
GFR calc non Af Amer: >60 mL/min (ref >60)
GLUCOSE: 101 mg/dL — AB (ref 65–99)
Potassium: 3.7 mmol/L (ref 3.5–5.1)
Sodium: 131 mmol/L — ABNORMAL LOW (ref 135–145)

## 2015-02-09 LAB — URINALYSIS, ROUTINE W REFLEX MICROSCOPIC
Glucose, UA: NEGATIVE mg/dL
HGB URINE DIPSTICK: NEGATIVE
KETONES UR: NEGATIVE mg/dL
Leukocytes, UA: NEGATIVE
NITRITE: NEGATIVE
PROTEIN: NEGATIVE mg/dL
SPECIFIC GRAVITY, URINE: 1.018 (ref 1.005–1.030)
UROBILINOGEN UA: 1 mg/dL (ref 0.0–1.0)
pH: 7 (ref 5.0–8.0)

## 2015-02-09 LAB — CBC WITH DIFFERENTIAL/PLATELET
BASO%: 0.4 % (ref 0.0–2.0)
BASOS ABS: 0 10*3/uL (ref 0.0–0.1)
Basophils Absolute: 0 10*3/uL (ref 0.0–0.1)
Basophils Relative: 0 % (ref 0–1)
EOS%: 2 % (ref 0.0–7.0)
Eosinophils Absolute: 0.2 10*3/uL (ref 0.0–0.5)
Eosinophils Absolute: 0.1 10*3/uL (ref 0.0–0.7)
Eosinophils Relative: 1 % (ref 0–5)
HCT: 30.3 % — ABNORMAL LOW (ref 34.8–46.6)
HEMATOCRIT: 33.6 % — AB (ref 36.0–46.0)
HEMOGLOBIN: 10 g/dL — AB (ref 11.6–15.9)
Hemoglobin: 10.7 g/dL — ABNORMAL LOW (ref 12.0–15.0)
LYMPH#: 0.2 10*3/uL — AB (ref 0.9–3.3)
LYMPH%: 2.5 % — ABNORMAL LOW (ref 14.0–49.7)
LYMPHS PCT: 4 % — AB (ref 12–46)
Lymphs Abs: 0.4 10*3/uL — ABNORMAL LOW (ref 0.7–4.0)
MCH: 24.6 pg — AB (ref 25.1–34.0)
MCH: 24.6 pg — ABNORMAL LOW (ref 26.0–34.0)
MCHC: 32.9 g/dL (ref 31.5–36.0)
MCHC: 31.8 g/dL (ref 30.0–36.0)
MCV: 74.6 fL — ABNORMAL LOW (ref 79.5–101.0)
MCV: 77.2 fL — AB (ref 78.0–100.0)
MONO ABS: 0.9 10*3/uL (ref 0.1–1.0)
MONO#: 0.8 10*3/uL (ref 0.1–0.9)
MONO%: 8.5 % (ref 0.0–14.0)
Monocytes Relative: 9 % (ref 3–12)
NEUT#: 8.2 10*3/uL — ABNORMAL HIGH (ref 1.5–6.5)
NEUT%: 86.6 % — ABNORMAL HIGH (ref 38.4–76.8)
NEUTROS ABS: 9.2 10*3/uL — AB (ref 1.7–7.7)
Neutrophils Relative %: 86 % — ABNORMAL HIGH (ref 43–77)
Platelets: 702 10*3/uL — ABNORMAL HIGH (ref 145–400)
Platelets: 564 10*3/uL — ABNORMAL HIGH (ref 150–400)
RBC: 4.05 10*6/uL (ref 3.70–5.45)
RBC: 4.35 MIL/uL (ref 3.87–5.11)
RDW: 19 % — AB (ref 11.2–14.5)
RDW: 17.2 % — AB (ref 11.5–15.5)
WBC: 9.5 10*3/uL (ref 3.9–10.3)
WBC: 10.6 10*3/uL — ABNORMAL HIGH (ref 4.0–10.5)

## 2015-02-09 LAB — COMPREHENSIVE METABOLIC PANEL (CC13)
ALT: <6 U/L (ref 0–55)
AST: 7 U/L (ref 5–34)
Albumin: 2.2 g/dL — ABNORMAL LOW (ref 3.5–5.0)
Alkaline Phosphatase: 150 U/L (ref 40–150)
Anion Gap: 10 mEq/L (ref 3–11)
BUN: 5.3 mg/dL — AB (ref 7.0–26.0)
CO2: 23 mEq/L (ref 22–29)
Calcium: 9.2 mg/dL (ref 8.4–10.4)
Chloride: 99 mEq/L (ref 98–109)
Creatinine: 0.6 mg/dL (ref 0.6–1.1)
GLUCOSE: 117 mg/dL (ref 70–140)
POTASSIUM: 3.7 meq/L (ref 3.5–5.1)
Sodium: 131 mEq/L — ABNORMAL LOW (ref 136–145)
Total Bilirubin: 0.41 mg/dL (ref 0.20–1.20)
Total Protein: 5.5 g/dL — ABNORMAL LOW (ref 6.4–8.3)

## 2015-02-09 LAB — TSH CHCC: TSH: 2.773 m[IU]/L (ref 0.308–3.960)

## 2015-02-09 MED ORDER — PROMETHAZINE HCL 25 MG/ML IJ SOLN
12.5000 mg | Freq: Four times a day (QID) | INTRAMUSCULAR | Status: DC | PRN
  Filled 2015-02-09: qty 1

## 2015-02-09 MED ORDER — MORPHINE SULFATE ER 30 MG PO TBCR: 30.0000 mg | EXTENDED_RELEASE_TABLET | Freq: Three times a day (TID) | ORAL | Status: DC

## 2015-02-09 MED ORDER — CHLORHEXIDINE GLUCONATE 0.12 % MT SOLN
15.0000 mL | Freq: Two times a day (BID) | OROMUCOSAL | Status: DC
  Administered 2015-02-09 – 2015-02-13 (×8): 15 mL via OROMUCOSAL
  Filled 2015-02-09 (×13): qty 15

## 2015-02-09 MED ORDER — MORPHINE SULFATE ER 15 MG PO TBCR
15.0000 mg | EXTENDED_RELEASE_TABLET | Freq: Three times a day (TID) | ORAL | Status: DC
  Administered 2015-02-09 – 2015-02-14 (×12): 15 mg via ORAL
  Filled 2015-02-09 (×12): qty 1

## 2015-02-09 MED ORDER — MORPHINE SULFATE 15 MG PO TABS: 30.0000 mg | ORAL_TABLET | ORAL | Status: DC | PRN

## 2015-02-09 MED ORDER — FENTANYL 100 MCG/HR TD PT72
100.0000 ug | MEDICATED_PATCH | TRANSDERMAL | Status: DC
  Administered 2015-02-10: 100 ug via TRANSDERMAL
  Filled 2015-02-09: qty 1

## 2015-02-09 MED ORDER — SODIUM CHLORIDE 0.9 % IV SOLN
Freq: Once | INTRAVENOUS | Status: AC
  Administered 2015-02-09: 13:00:00 via INTRAVENOUS

## 2015-02-09 MED ORDER — HEPARIN (PORCINE) IN NACL 100-0.45 UNIT/ML-% IJ SOLN
700.0000 [IU]/h | INTRAMUSCULAR | Status: DC
  Administered 2015-02-09: 700 [IU]/h via INTRAVENOUS
  Filled 2015-02-09: qty 250

## 2015-02-09 MED ORDER — DULOXETINE HCL 30 MG PO CPEP
30.0000 mg | ORAL_CAPSULE | Freq: Every day | ORAL | Status: DC
  Administered 2015-02-10 – 2015-02-14 (×5): 30 mg via ORAL
  Filled 2015-02-09 (×6): qty 1

## 2015-02-09 MED ORDER — HEPARIN BOLUS VIA INFUSION
2500.0000 [IU] | Freq: Once | INTRAVENOUS | Status: AC
  Administered 2015-02-09: 2500 [IU] via INTRAVENOUS
  Filled 2015-02-09: qty 2500

## 2015-02-09 MED ORDER — FENTANYL CITRATE (PF) 100 MCG/2ML IJ SOLN
50.0000 ug | Freq: Once | INTRAMUSCULAR | Status: AC
  Administered 2015-02-09: 50 ug via INTRAVENOUS
  Filled 2015-02-09: qty 2

## 2015-02-09 MED ORDER — MORPHINE SULFATE 15 MG PO TABS
30.0000 mg | ORAL_TABLET | ORAL | Status: DC | PRN
  Administered 2015-02-09 – 2015-02-11 (×7): 30 mg via ORAL
  Filled 2015-02-09 (×7): qty 2

## 2015-02-09 MED ORDER — FENTANYL 100 MCG/HR TD PT72: 100.0000 ug | MEDICATED_PATCH | TRANSDERMAL | Status: DC

## 2015-02-09 MED ORDER — SODIUM CHLORIDE 0.9 % IV BOLUS (SEPSIS)
1000.0000 mL | Freq: Once | INTRAVENOUS | Status: AC
  Administered 2015-02-09: 1000 mL via INTRAVENOUS

## 2015-02-09 MED ORDER — CETYLPYRIDINIUM CHLORIDE 0.05 % MT LIQD
7.0000 mL | Freq: Two times a day (BID) | OROMUCOSAL | Status: DC
  Administered 2015-02-10 – 2015-02-13 (×4): 7 mL via OROMUCOSAL

## 2015-02-09 MED ORDER — DEXTROSE-NACL 5-0.45 % IV SOLN
INTRAVENOUS | Status: DC
  Administered 2015-02-10 – 2015-02-13 (×3): via INTRAVENOUS

## 2015-02-09 MED ORDER — KETOROLAC TROMETHAMINE 15 MG/ML IJ SOLN
15.0000 mg | Freq: Three times a day (TID) | INTRAMUSCULAR | Status: DC | PRN
  Administered 2015-02-09 – 2015-02-10 (×2): 15 mg via INTRAVENOUS
  Filled 2015-02-09 (×2): qty 1

## 2015-02-09 MED ORDER — MORPHINE SULFATE ER 30 MG PO TBCR
30.0000 mg | EXTENDED_RELEASE_TABLET | Freq: Three times a day (TID) | ORAL | Status: DC
  Administered 2015-02-09 – 2015-02-14 (×13): 30 mg via ORAL
  Filled 2015-02-09 (×13): qty 1

## 2015-02-09 MED ORDER — SENNOSIDES-DOCUSATE SODIUM 8.6-50 MG PO TABS
1.0000 | ORAL_TABLET | Freq: Two times a day (BID) | ORAL | Status: DC
  Administered 2015-02-09 – 2015-02-14 (×10): 1 via ORAL
  Filled 2015-02-09 (×13): qty 1

## 2015-02-09 MED ORDER — POLYETHYLENE GLYCOL 3350 17 G PO PACK
17.0000 g | PACK | Freq: Every day | ORAL | Status: DC
  Administered 2015-02-11 – 2015-02-14 (×4): 17 g via ORAL

## 2015-02-09 MED ORDER — ACETAMINOPHEN 325 MG PO TABS
650.0000 mg | ORAL_TABLET | Freq: Four times a day (QID) | ORAL | Status: DC | PRN
  Administered 2015-02-10: 650 mg via ORAL
  Filled 2015-02-09: qty 2

## 2015-02-09 MED ORDER — SODIUM CHLORIDE 0.9 % IV SOLN
3.0000 mg/kg | Freq: Once | INTRAVENOUS | Status: AC
  Administered 2015-02-09: 160 mg via INTRAVENOUS
  Filled 2015-02-09: qty 16

## 2015-02-09 NOTE — Patient Instructions (Signed)
[facility] Discharge Instructions for Patients Receiving Chemotherapy  Today you received the following chemotherapy agents: Nivolumab  To help prevent nausea and vomiting after your treatment, we encourage you to take your nausea medication as prescribed by your physician.    If you develop nausea and vomiting that is not controlled by your nausea medication, call the clinic.   BELOW ARE SYMPTOMS THAT SHOULD BE REPORTED IMMEDIATELY:  *FEVER GREATER THAN 100.5 F  *CHILLS WITH OR WITHOUT FEVER  NAUSEA AND VOMITING THAT IS NOT CONTROLLED WITH YOUR NAUSEA MEDICATION  *UNUSUAL SHORTNESS OF BREATH  *UNUSUAL BRUISING OR BLEEDING  TENDERNESS IN MOUTH AND THROAT WITH OR WITHOUT PRESENCE OF ULCERS  *URINARY PROBLEMS  *BOWEL PROBLEMS  UNUSUAL RASH Items with * indicate a potential emergency and should be followed up as soon as possible.  Feel free to call the clinic you have any questions or concerns. The clinic phone number is [phone].  Please show the CHEMO ALERT CARD at check-in to the Emergency Department and triage nurse.   

## 2015-02-09 NOTE — ED Provider Notes (Signed)
CSN: [id number]     Arrival date & time 02/09/15  1514 History   First MD Initiated Contact with Patient 02/09/15 1541     Chief Complaint  Patient presents with  . Hip Pain     (Consider location/radiation/quality/duration/timing/severity/associated sxs/prior Treatment) HPI Comments: Patient with PMH of lung cancer currently getting chemo presents to the ED with a chief complaint of left hip pain.  She states that she walks with a walker and fell about a week ago after she tried to take 2 steps without the walker and lost her balancing falling and striking her left hip.  She states that she has been having persistent pain, but has been able to continue ambulating with a walker.  Patient states that when she saw her oncologist, it was recommended that she get an x-ray of her hip given the persistent pain.  This occurred today and showed a left subcapital hip fracture.  She was referred to the ED for further management from radiology.  Last oral intake was 3 hours ago.    The history is provided by the patient. No language interpreter was used.    Past Medical History  Diagnosis Date  . Cancer     left bronchioles  . Radiation 11/24/14-12/05/14    Left ribs 30 Gy in 10 fractions   Past Surgical History  Procedure Laterality Date  . Esophagogastroduodenoscopy N/A 09/14/2014    Procedure: ESOPHAGOGASTRODUODENOSCOPY (EGD);  Surgeon: Teena Irani, MD;  Location: [facility];  Service: Endoscopy;  Laterality: N/A;   Family History  Problem Relation Age of Onset  . Cancer Mother   . Heart failure Father    Social History  Substance Use Topics  . Smoking status: Former Smoker    Quit date: 09/07/2014  . Smokeless tobacco: None  . Alcohol Use: No   OB History    No data available     Review of Systems  Constitutional: Negative for fever and chills.  Respiratory: Negative for shortness of breath.   Cardiovascular: Negative for chest pain.  Gastrointestinal: Negative for nausea,  vomiting, diarrhea and constipation.  Genitourinary: Negative for dysuria.  Musculoskeletal: Positive for arthralgias.      Allergies  Penicillins  Home Medications   Prior to Admission medications   Medication Sig Start Date End Date Taking? Authorizing Provider  acetaminophen (TYLENOL) 325 MG tablet Take 2 tablets (650 mg total) by mouth every 6 (six) hours as needed for mild pain (or Fever >/= 101). 09/30/14   Bonnielee Haff, MD  diclofenac (VOLTAREN) 50 MG EC tablet TK 1 T PO BID 01/07/15   Historical Provider, MD  DULoxetine (CYMBALTA) 30 MG capsule Take 30 mg by mouth. 12/05/14   Historical Provider, MD  enoxaparin (LOVENOX) 80 MG/0.8ML injection ADMINISTER 0.8 ML UNDER THE SKIN DAILY 01/26/15   Maryanna Shape, NP  fentaNYL (DURAGESIC - DOSED MCG/HR) 100 MCG/HR Place 1 patch (100 mcg total) onto the skin every 3 (three) days. 02/09/15   Carlton Adam, PA-C  ferrous sulfate 325 (65 FE) MG EC tablet Take 1 tablet (325 mg total) by mouth 2 (two) times daily with a meal. 01/26/15   Maryanna Shape, NP  ipratropium (ATROVENT) 0.02 % nebulizer solution Take 2.5 mLs (0.5 mg total) by nebulization 3 (three) times daily. 11/18/14   Maryanna Shape, NP  LORazepam (ATIVAN) 1 MG tablet Take 1 tablet (1 mg total) by mouth every 6 (six) hours as needed for anxiety. Patient not taking: Reported on 02/09/2015 11/18/14  Maryanna Shape, NP  morphine (MS CONTIN) 15 MG 12 hr tablet Take 1 tablet (15 mg total) by mouth 3 (three) times daily. 01/26/15   Maryanna Shape, NP  morphine (MS CONTIN) 30 MG 12 hr tablet Take 1 tablet (30 mg total) by mouth every 8 (eight) hours. 02/09/15   Carlton Adam, PA-C  morphine (MSIR) 15 MG tablet Take 2 tablets (30 mg total) by mouth every 4 (four) hours as needed for severe pain. 02/09/15   Carlton Adam, PA-C  omeprazole (PRILOSEC) 20 MG capsule Take 1 capsule (20 mg total) by mouth 2 (two) times daily before a meal. 09/30/14   Bonnielee Haff, MD  ondansetron  (ZOFRAN) 8 MG tablet TAKE 1 TABLET(8 MG) BY MOUTH EVERY 8 HOURS AS NEEDED FOR NAUSEA OR VOMITING 11/26/14   Maryanna Shape, NP  polyethylene glycol (MIRALAX / GLYCOLAX) packet Take 17 g by mouth 3 (three) times daily. May decrease to twice daily if you have frequent bowel movements. 09/30/14   Bonnielee Haff, MD  senna-docusate (SENOKOT-S) 8.6-50 MG per tablet Take 1 tablet by mouth at bedtime. 09/30/14   Bonnielee Haff, MD   BP 113/79 mmHg  Pulse 98  Temp(Src) 98.7 F (37.1 C) (Oral)  Resp 16  SpO2 97% Physical Exam  Constitutional: She is oriented to person, place, and time. She appears well-developed and well-nourished.  HENT:  Head: Normocephalic and atraumatic.  Eyes: Conjunctivae and EOM are normal. Pupils are equal, round, and reactive to light.  Neck: Normal range of motion. Neck supple.  Cardiovascular: Normal rate, regular rhythm and intact distal pulses.  Exam reveals no gallop and no friction rub.   No murmur heard. Brisk cap refill  Pulmonary/Chest: Effort normal and breath sounds normal. No respiratory distress. She has no wheezes. She has no rales. She exhibits no tenderness.  Abdominal: Soft. Bowel sounds are normal. She exhibits no distension and no mass. There is no tenderness. There is no rebound and no guarding.  Musculoskeletal: Normal range of motion. She exhibits no edema or tenderness.  Left hip moderately ttp, mostly located in the left groin, ROM and strength limited 2/2 pain  Neurological: She is alert and oriented to person, place, and time.  Distal sensation intact  Skin: Skin is warm and dry.  Mild old contusion to left lateral upper thigh  Psychiatric: She has a normal mood and affect. Her behavior is normal. Judgment and thought content normal.  Nursing note and vitals reviewed.   ED Course  Procedures (including critical care time) Labs Review Labs Reviewed  CBC WITH DIFFERENTIAL/PLATELET - Abnormal; Notable for the following:    WBC 10.6 (*)     Hemoglobin 10.7 (*)    HCT 33.6 (*)    MCV 77.2 (*)    MCH 24.6 (*)    RDW 17.2 (*)    Platelets 564 (*)    Neutrophils Relative % 86 (*)    Neutro Abs 9.2 (*)    Lymphocytes Relative 4 (*)    Lymphs Abs 0.4 (*)    All other components within normal limits  BASIC METABOLIC PANEL - Abnormal; Notable for the following:    Sodium 131 (*)    Chloride 95 (*)    Glucose, Bld 101 (*)    All other components within normal limits    Imaging Review Dg Ribs Unilateral W/chest Left  02/09/2015   CLINICAL DATA:  Fall last Wednesday left mid axillary rib pain.  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 No confluent opacities on the right. No visible rib fracture. Heart is normal size.  IMPRESSION: Continued complete opacification of the left hemithorax as seen on prior chest CT.   Electronically Signed   By: Rolm Baptise M.D.   On: 02/09/2015 14:55   Dg Hip Unilat W Or W/o Pelvis 2-3 Views Left  02/09/2015   CLINICAL DATA:  Fall, left hip pain  EXAM: DG HIP (WITH OR WITHOUT PELVIS) 2-3V LEFT  COMPARISON:  None.  FINDINGS: Subcapital left hip fracture with foreshortening and varus angulation.  Bilateral hip joint spaces are symmetric.  Visualized bony pelvis appears intact.  IMPRESSION: Subcapital left hip fracture, as above.  These results will be called to the ordering clinician or representative by the Radiology Department at the imaging location.   Electronically Signed   By: Julian Hy M.D.   On: 02/09/2015 14:56   I have personally reviewed and evaluated these images and lab results as part of my medical decision-making.   EKG Interpretation None      MDM   Final diagnoses:  Subcapital fracture of hip, left, closed, initial encounter    Patient with metastatic lung cancer with recent fall.  Left hip plain films show  subcapital hip fracture today.  Will consult orthopedics.  Patient seen by and discussed with Dr. Thomasene Lot.  4:35 PM Patient discussed with Dr. Ninfa Linden of orthopedics, who recommends admission to hospitalist and CT of left hip.  Plan for surgery tomorrow evening.  5:12 PM Patient discussed with Dr. Erlinda Hong, who will admit the patient.   Montine Circle, PA-C 02/09/15 1713  Courteney Julio Alm, MD 02/09/15 [phone]

## 2015-02-09 NOTE — Progress Notes (Addendum)
[facility] [phone]   [phone]  OFFICE PROGRESS NOTE  No primary care provider on file. No primary provider on file.  DIAGNOSIS:  1) Metastatic non-small cell lung cancer, squamous cell carcinoma initially diagnosed as a stage IIIB diagnosed in March 2015. 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  PRIOR THERAPY:  1) Status post course of concurrent chemoradiation with weekly carboplatin and paclitaxel but the patient did not receive any consolidation chemotherapy.  CURRENT THERAPY: Immunotherapy with Nivolumab 3 MG/KG every 2 weeks. First dose 11/18/2014. Status post 5 cycles.   INTERVAL HISTORY: [name] 49 y.o. female returns to the clinic today for follow-up visit accompanied by her brother. The patient complains of increased pain in her left ribs and left hip pain after a fall last week. She was trying ambulate without her walker and fell. She has been usin her breakthrough pain medication more frequently as a result.  Her left-sided chest pain had been well controlled with her current pain medication was fentanyl patch 100 g/hour every 3 days, MS Contin 45 mg by mouth every 8 hours in addition to MSIR IR for breakthrough pain. She takes around 2 tablets of MSIR per day. She request refills for the Fentanyl patches, MSContin and MSIR. She continues to have mild shortness of breath as well as mild cough but no hemoptysis. She has no nausea or vomiting, no fever or chills. She is tolerating her treatment with Nivolumab fairly well. She denied any increase in her baseline shortness of breath, denied diarrhea or skin rashes.  Symptomatically the patient has been feeling well since she started the immunotherapy.   MEDICAL HISTORY: Past Medical History  Diagnosis Date  . Cancer     left bronchioles  . Radiation 11/24/14-12/05/14    Left ribs 30 Gy in 10 fractions    ALLERGIES:  is  allergic to penicillins.  MEDICATIONS:  Current Outpatient Prescriptions  Medication Sig Dispense Refill  . acetaminophen (TYLENOL) 325 MG tablet Take 2 tablets (650 mg total) by mouth every 6 (six) hours as needed for mild pain (or Fever >/= 101).    Marland Kitchen diclofenac (VOLTAREN) 50 MG EC tablet TK 1 T PO BID  0  . DULoxetine (CYMBALTA) 30 MG capsule Take 30 mg by mouth.    . enoxaparin (LOVENOX) 80 MG/0.8ML injection ADMINISTER 0.8 ML UNDER THE SKIN DAILY 24 mL 0  . fentaNYL (DURAGESIC - DOSED MCG/HR) 100 MCG/HR Place 1 patch (100 mcg total) onto the skin every 3 (three) days. 5 patch 0  . ferrous sulfate 325 (65 FE) MG EC tablet Take 1 tablet (325 mg total) by mouth 2 (two) times daily with a meal. 60 tablet 1  . ipratropium (ATROVENT) 0.02 % nebulizer solution Take 2.5 mLs (0.5 mg total) by nebulization 3 (three) times daily. 75 mL 0  . LORazepam (ATIVAN) 1 MG tablet Take 1 tablet (1 mg total) by mouth every 6 (six) hours as needed for anxiety. (Patient not taking: Reported on 02/09/2015) 30 tablet 0  . morphine (MS CONTIN) 15 MG 12 hr tablet Take 1 tablet (15 mg total) by mouth 3 (three) times daily. 90 tablet 0  . morphine (MS CONTIN) 30 MG 12 hr tablet Take 1 tablet (30 mg total) by mouth every 8 (eight) hours. 90 tablet 0  . morphine (MSIR) 15 MG tablet Take 2 tablets (30 mg total) by mouth every 4 (four) hours  as needed for severe pain. 90 tablet 0  . omeprazole (PRILOSEC) 20 MG capsule Take 1 capsule (20 mg total) by mouth 2 (two) times daily before a meal. 60 capsule 1  . ondansetron (ZOFRAN) 8 MG tablet TAKE 1 TABLET(8 MG) BY MOUTH EVERY 8 HOURS AS NEEDED FOR NAUSEA OR VOMITING 20 tablet 0  . polyethylene glycol (MIRALAX / GLYCOLAX) packet Take 17 g by mouth 3 (three) times daily. May decrease to twice daily if you have frequent bowel movements. 120 each 2  . senna-docusate (SENOKOT-S) 8.6-50 MG per tablet Take 1 tablet by mouth at bedtime. 30 tablet 2   No current facility-administered  medications for this visit.    SURGICAL HISTORY:  Past Surgical History  Procedure Laterality Date  . Esophagogastroduodenoscopy N/A 09/14/2014    Procedure: ESOPHAGOGASTRODUODENOSCOPY (EGD);  Surgeon: Teena Irani, MD;  Location: [facility];  Service: Endoscopy;  Laterality: N/A;    REVIEW OF SYSTEMS:  Constitutional: positive for anorexia, fatigue and weight loss Eyes: negative Ears, nose, mouth, throat, and face: negative Respiratory: positive for cough and dyspnea on exertion Cardiovascular: negative Gastrointestinal: negative Genitourinary:negative Integument/breast: negative Hematologic/lymphatic: negative Musculoskeletal:positive for arthralgias, bone pain and increased left rib pain and left hip pain related to a recent fall Neurological: negative Behavioral/Psych: negative Endocrine: negative Allergic/Immunologic: negative   PHYSICAL EXAMINATION: General appearance: alert, cooperative, fatigued and no distress Head: Normocephalic, without obvious abnormality, atraumatic Neck: no adenopathy, no JVD, supple, symmetrical, trachea midline and thyroid not enlarged, symmetric, no tenderness/mass/nodules Lymph nodes: Cervical, supraclavicular, and axillary nodes normal. Resp: clear to auscultation bilaterally Back: symmetric, no curvature. ROM normal. No CVA tenderness. Cardio: regular rate and rhythm, S1, S2 normal, no murmur, click, rub or gallop GI: soft, non-tender; bowel sounds normal; no masses,  no organomegaly Extremities: extremities normal, atraumatic, no cyanosis or edema Neurologic: Alert and oriented X 3, normal strength and tone. Normal symmetric reflexes. Normal coordination and gait Increased pain in left rib pain, point tenderness left hip/groin area  ECOG PERFORMANCE STATUS: 2 - Symptomatic, <50% confined to bed  Blood pressure 111/75, pulse 76, temperature 97.6 F (36.4 C), temperature source Oral, resp. rate 18, height '5\' 7"'$  (1.702 m), SpO2 98  %.  LABORATORY DATA: Lab Results  Component Value Date   WBC 9.5 02/09/2015   HGB 10.0* 02/09/2015   HCT 30.3* 02/09/2015   MCV 74.6* 02/09/2015   PLT 702* 02/09/2015      Chemistry      Component Value Date/Time   NA 131* 02/09/2015 1057   NA 131* 09/29/2014 0540   K 3.7 02/09/2015 1057   K 5.1 09/29/2014 0540   CL 102 09/29/2014 0540   CO2 23 02/09/2015 1057   CO2 22 09/29/2014 0540   BUN 5.3* 02/09/2015 1057   BUN 21 09/29/2014 0540   CREATININE 0.6 02/09/2015 1057   CREATININE 0.56 09/29/2014 0540      Component Value Date/Time   CALCIUM 9.2 02/09/2015 1057   CALCIUM 9.5 09/29/2014 0540   ALKPHOS 150 02/09/2015 1057   ALKPHOS 81 09/27/2014 0550   AST 7 02/09/2015 1057   AST 14 09/27/2014 0550   ALT <6 02/09/2015 1057   ALT 6 09/27/2014 0550   BILITOT 0.41 02/09/2015 1057   BILITOT 0.7 09/27/2014 0550       RADIOGRAPHIC STUDIES: No results found.  ASSESSMENT AND PLAN: This is a very pleasant 49 year old white female with metastatic non-small cell lung cancer, squamous cell carcinoma status post a course of concurrent  chemoradiation as well as palliative radiotherapy to the left side of the chest. The patient is currently undergoing treatment with immunotherapy with Nivolumab status post 5 cycles. She is tolerating her treatment fairly well with no significant adverse effects. The patient has improvement in her symptoms since starting the immunotherapy. Unfortunately the recent CT scan of the chest showed questionable disease progression but this was done without contrast. The patient opted to continue Nivolumab instead of switching to chemo.  The patient was discussed with and also seen by Dr. Julien Nordmann. She will proceed with cycle #7  of Nivolumab today as scheduled. She will come back for follow-up visit in 2 weeks for reevaluation with the next cycle of her treatment.  To further evaluate her complaints of increased left rib pain and left hip pain after her recent  fall we will obtain plain X-rays of her chest with left rib detail and left hip/pelvis -looking for possible fracture.  For pain management the patient will continue on treatment with fentanyl patch, MS Contin as well as MSIR. She was given a refill of the MS Contin,  MSIR and Fentanyl patches today.  Hemoglobin is noted to be drifting down with a low MCV. Patient will begin iron supplementations. Prescription was sent to her local pharmacy. Encouraged her to continue to take  MiraLax and stool softeners.  The patient voices understanding of current disease status and treatment options and is in agreement with the current care plan.  All questions were answered. The patient knows to call the clinic with any problems, questions or concerns. We can certainly see the patient much sooner if necessary.  Awilda Metro E, PA-C 02/09/2015    Addendum: Notified by radiology that patient had a left subcapital hip fracture. She will be sent to the [facility] emergency department for further evaluation and orthopedic management. Dr. Julien Nordmann aware.  Carlton Adam PA-C  ADDENDUM: Hematology/Oncology Attending: I had a face to face encounter with the patient. I recommended her care plan. This is a very pleasant 49 years old white female with metastatic non-small cell lung cancer, squamous cell carcinoma who is currently undergoing treatment with immunotherapy with Nivolumab status post 6 cycles. The patient has a fall at home a week ago. She did not seek any medical attention and that time. She is now complaining of increasing pain in the left rib and hip area. She is currently on several pain medication including fentanyl patch in addition to MS Contin as well as MSIR. She was in tears in the clinic because of her pain. We'll order a stat x-ray of the left hip which showed subcutaneous Left hip fracture. The patient will receive cycle #7 of her immunotherapy as scheduled and she was advised to go to  the emergency department for evaluation and management of the new left hip fracture. She would come back for follow-up visit in 2 weeks for reevaluation before starting the next cycle of her immunotherapy. The patient was advised to call immediately if she has any concerning symptoms in the interval.  Disclaimer: This note was dictated with voice recognition software. Similar sounding words can inadvertently be transcribed and may be missed upon review.  Eilleen Kempf., MD 02/09/2015

## 2015-02-09 NOTE — Patient Instructions (Signed)
Obtain x-ray of your left ribs and left hip as ordered Follow up in 2 weeks

## 2015-02-09 NOTE — Progress Notes (Signed)
ANTICOAGULATION CONSULT NOTE - Initial Consult  Pharmacy Consult for Heparin Indication: hx SVC thrombus  Allergies  Allergen Reactions  . Penicillins Rash    Patient Measurements:   Heparin dosing weight: 52 kg (based on weight from 01/26/15)  Vital Signs: Temp: 98.7 F (37.1 C) (08/22 1524) Temp Source: Oral (08/22 1524) BP: 113/79 mmHg (08/22 1524) Pulse Rate: 98 (08/22 1524)  Labs:  Recent Labs  02/09/15 1056 02/09/15 1057 02/09/15 1613  HGB 10.0*  --  10.7*  HCT 30.3*  --  33.6*  PLT 702*  --  564*  CREATININE  --  0.6 0.53    CrCl cannot be calculated (Unknown ideal weight.).   Medical History: Past Medical History  Diagnosis Date  . Cancer     left bronchioles  . Radiation 11/24/14-12/05/14    Left ribs 30 Gy in 10 fractions    Medications:   (Not in a hospital admission)  Assessment: 49 yo F who is receiving treatment for metastatic lung CA presented at [facility] today for Opdivo infusion and complaint of left hip pain from a fall a few days ago.  Xray + left hip fracture.  Plan for THA tomorrow afternoon/early evening. She has been on Lovenox '80mg'$  (1.'5mg'$ /kg) q24h since March 2016 for SVC thrombosis.  Last injection was 8/21 @ 7pm.   CBC and renal function are stable at patient's baseline.  No bleeding noted.  Pharmacy is asked to transition patient to IV heparin infusion perioperatively.   Goal of Therapy:  Heparin level 0.3-0.7 units/ml Monitor platelets by anticoagulation protocol: Yes   Plan:  Give 2500 units bolus x 1 Start heparin infusion at 700 units/hr Check anti-Xa level in 8 hours and daily while on heparin Continue to monitor H&H and platelets  F/U OR plans & timing to hold heparin pre-op.    Biagio Borg 02/09/2015,5:47 PM

## 2015-02-09 NOTE — H&P (Signed)
History and Physical  [name] JME:[id number] DOB: 08-28-1965 DOA: 02/09/2015  Referring physician: EDP PCP: No primary care provider on file.   Chief Complaint: left hip fracture  HPI: [name] is a 49 y.o. female  With metastatic squamous cell lung cancer, sustained a fall a few days ago, with left hip pain, today she is seen at the cancer therapy to received immunotherapy, hip x ray showed left hip fracture, she is referred to the ER.   ED course: vital stable, lab mild leukocytosis, mild hyponatremia, otherwise unremarkable. orthopedic surgery Dr. Ninfa Linden consulted and advised to get CT left hip, admit to hospitalist service, anticipate surgery tomorrow.  Patient otherwise stable at baseline, had chronic nonproductive cough, no fever, no dysuria, no diarrhea, she has chronic cancer pain which is controlled.   Review of Systems:  Detail per HPI, Review of systems are otherwise negative  Past Medical History  Diagnosis Date  . Cancer     left bronchioles  . Radiation 11/24/14-12/05/14    Left ribs 30 Gy in 10 fractions   Past Surgical History  Procedure Laterality Date  . Esophagogastroduodenoscopy N/A 09/14/2014    Procedure: ESOPHAGOGASTRODUODENOSCOPY (EGD);  Surgeon: Teena Irani, MD;  Location: [facility];  Service: Endoscopy;  Laterality: N/A;   Social History:  reports that she quit smoking about 5 months ago. She does not have any smokeless tobacco history on file. She reports that she does not drink alcohol or use illicit drugs. Patient lives at home & is able to participate in activities of daily living with a walker  Allergies  Allergen Reactions  . Penicillins Rash    Family History  Problem Relation Age of Onset  . Cancer Mother   . Heart failure Father       Prior to Admission medications   Medication Sig Start Date End Date Taking? Authorizing Provider  diclofenac (VOLTAREN) 50 MG EC tablet TK 1 T PO BID 01/07/15  Yes Historical  Provider, MD  DULoxetine (CYMBALTA) 30 MG capsule Take 30 mg by mouth daily.  12/05/14  Yes Historical Provider, MD  enoxaparin (LOVENOX) 80 MG/0.8ML injection ADMINISTER 0.8 ML UNDER THE SKIN DAILY 01/26/15  Yes Maryanna Shape, NP  ferrous sulfate 325 (65 FE) MG EC tablet Take 1 tablet (325 mg total) by mouth 2 (two) times daily with a meal. 01/26/15  Yes Maryanna Shape, NP  morphine (MS CONTIN) 15 MG 12 hr tablet Take 1 tablet (15 mg total) by mouth 3 (three) times daily. 01/26/15  Yes Maryanna Shape, NP  morphine (MS CONTIN) 30 MG 12 hr tablet Take 1 tablet (30 mg total) by mouth every 8 (eight) hours. 02/09/15  Yes Adrena E Johnson, PA-C  morphine (MSIR) 15 MG tablet Take 2 tablets (30 mg total) by mouth every 4 (four) hours as needed for severe pain. 02/09/15  Yes Adrena E Johnson, PA-C  ondansetron (ZOFRAN) 8 MG tablet TAKE 1 TABLET(8 MG) BY MOUTH EVERY 8 HOURS AS NEEDED FOR NAUSEA OR VOMITING 11/26/14  Yes Maryanna Shape, NP  polyethylene glycol (MIRALAX / GLYCOLAX) packet Take 17 g by mouth 3 (three) times daily. May decrease to twice daily if you have frequent bowel movements. Patient taking differently: Take 17 g by mouth daily.  09/30/14  Yes Bonnielee Haff, MD  senna-docusate (SENOKOT-S) 8.6-50 MG per tablet Take 1 tablet by mouth at bedtime. Patient taking differently: Take 1 tablet by mouth daily.  09/30/14  Yes Bonnielee Haff, MD  acetaminophen (  TYLENOL) 325 MG tablet Take 2 tablets (650 mg total) by mouth every 6 (six) hours as needed for mild pain (or Fever >/= 101). Patient not taking: Reported on 02/09/2015 09/30/14   Bonnielee Haff, MD  fentaNYL (DURAGESIC - DOSED MCG/HR) 100 MCG/HR Place 1 patch (100 mcg total) onto the skin every 3 (three) days. 02/09/15   Carlton Adam, PA-C  ipratropium (ATROVENT) 0.02 % nebulizer solution Take 2.5 mLs (0.5 mg total) by nebulization 3 (three) times daily. Patient not taking: Reported on 02/09/2015 11/18/14   Maryanna Shape, NP  LORazepam (ATIVAN)  1 MG tablet Take 1 tablet (1 mg total) by mouth every 6 (six) hours as needed for anxiety. Patient not taking: Reported on 02/09/2015 11/18/14   Maryanna Shape, NP  omeprazole (PRILOSEC) 20 MG capsule Take 1 capsule (20 mg total) by mouth 2 (two) times daily before a meal. Patient not taking: Reported on 02/09/2015 09/30/14   Bonnielee Haff, MD    Physical Exam: BP 113/79 mmHg  Pulse 98  Temp(Src) 98.7 F (37.1 C) (Oral)  Resp 16  SpO2 97%  General:  Frail, NAD, pleasant Eyes: PERRL ENT: alopecia Neck: supple, no JVD Cardiovascular: RRR Respiratory: CTABL Abdomen: soft/ND/ND, positive bowel sounds Skin: no rash Musculoskeletal:  Left leg shortened, tender to palpation left groin, left lateral thigh. Psychiatric: calm/cooperative Neurologic: no focal findings            Labs on Admission:  Basic Metabolic Panel:  Recent Labs Lab 02/09/15 1057 02/09/15 1613  NA 131* 131*  K 3.7 3.7  CL  --  95*  CO2 23 25  GLUCOSE 117 101*  BUN 5.3* 7  CREATININE 0.6 0.53  CALCIUM 9.2 9.2   Liver Function Tests:  Recent Labs Lab 02/09/15 1057  AST 7  ALT <6  ALKPHOS 150  BILITOT 0.41  PROT 5.5*  ALBUMIN 2.2*   No results for input(s): LIPASE, AMYLASE in the last 168 hours. No results for input(s): AMMONIA in the last 168 hours. CBC:  Recent Labs Lab 02/09/15 1056 02/09/15 1613  WBC 9.5 10.6*  NEUTROABS 8.2* 9.2*  HGB 10.0* 10.7*  HCT 30.3* 33.6*  MCV 74.6* 77.2*  PLT 702* 564*   Cardiac Enzymes: No results for input(s): CKTOTAL, CKMB, CKMBINDEX, TROPONINI in the last 168 hours.  BNP (last 3 results)  Recent Labs  09/22/14 0405  BNP 301.5*    ProBNP (last 3 results) No results for input(s): PROBNP in the last 8760 hours.  CBG: No results for input(s): GLUCAP in the last 168 hours.  Radiological Exams on Admission: Dg Ribs Unilateral W/chest Left  02/09/2015   CLINICAL DATA:  Fall last Wednesday left mid axillary rib pain.  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 No confluent opacities on the right. No visible rib fracture. Heart is normal size.  IMPRESSION: Continued complete opacification of the left hemithorax as seen on prior chest CT.   Electronically Signed   By: Rolm Baptise M.D.   On: 02/09/2015 14:55   Ct Hip Left Wo Contrast  02/09/2015   CLINICAL DATA:  Status post fall 6 days ago.  Left hip pain.  EXAM: CT OF THE LEFT HIP WITHOUT CONTRAST  TECHNIQUE: Multidetector CT imaging of the left hip was performed according to the standard protocol. Multiplanar CT image reconstructions were  also generated.  COMPARISON:  None.  FINDINGS: There is a comminuted, impacted and mildly displaced left subcapital fracture. There is no other fracture or dislocation. There is no lytic or sclerotic osseous lesion. The superior and inferior pubic rami are intact. The muscles are normal. There is no fluid collection or hematoma.  There is no pelvic free fluid.  Visualized bladder is normal.  IMPRESSION: Comminuted, impacted and mildly displaced left femoral subcapital fracture.   Electronically Signed   By: Kathreen Devoid   On: 02/09/2015 17:04   Dg Hip Unilat W Or W/o Pelvis 2-3 Views Left  02/09/2015   CLINICAL DATA:  Fall, left hip pain  EXAM: DG HIP (WITH OR WITHOUT PELVIS) 2-3V LEFT  COMPARISON:  None.  FINDINGS: Subcapital left hip fracture with foreshortening and varus angulation.  Bilateral hip joint spaces are symmetric.  Visualized bony pelvis appears intact.  IMPRESSION: Subcapital left hip fracture, as above.  These results will be called to the ordering clinician or representative by the Radiology Department at the imaging location.   Electronically Signed   By: Julian Hy M.D.   On: 02/09/2015 14:56     Assessment/Plan Present on Admission:  . Subcapital fracture of left hip . Closed  left hip fracture  Subcapital fracture of Left hip: comminuted, impacted, mildly displaced. Bedrest, orthopedics consulted, surgery tomorrow. Npo after midnight.  Chronic cancer pain, continue fentanyl172mgpatchq3d, mscontin '45mg'$  q8hr,MSIR '30mg'$  q4hr prn. Continue miralax/senokot-s.  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 Pharmacy to coordinate with OR to stop heparin drip prior to surgery.  Metastatic squamous cell lung cancer: s/o concurrent chemoradiation. In 2015, now on innumontherapy Nivolumab q2wk ,last dose 8/22. Defer to oncology.    DVT prophylaxis: on heparin drip  Consultants: orthopedics Dr. BNinfa Lindenby EDP  Code Status: DRI per prior documentation  Family Communication:  Patient and borther  Disposition Plan: admit to med surg  Time spent: 729ms  Aedyn Mckeon MD, PhD [facility] Pager [phone] 7PM-7AM, please contact night-coverage at [email or web address], password [email or web address]

## 2015-02-09 NOTE — ED Notes (Addendum)
Pt reported ambulating without walker and loss balanced and fell. Pt reported pain on inside of lt hip today. Noted bruising to lt upper posterior leg area, lt leg length discrepancies,  (+)PMS, CRT brisk, LROM, PWB status. Pt had X-ray done and resulted lt hip fx today. Pt denies hitting head, LOC, dizziness/ lightheadedness, visual disturbances, nausea or headaches.

## 2015-02-09 NOTE — Consult Note (Signed)
Reason for Consult:  Left hip femoral neck fracture Referring Physician: EDP - MD  [name] is an 49 y.o. female.  HPI:   49 yo female with a history of lung cancer who reports left hip pain after a mechanical fall last week.  Had increasing difficulty ambulating.  Reported this to her Oncologist today.  Xrays were obtained of her left hip showing an acute appearing left hip femoral neck fracture.  She does report hip pain.  Family is at the bedside.  Ortho is consulted to eval and treat her fracture.  Past Medical History  Diagnosis Date  . Cancer     left bronchioles  . Radiation 11/24/14-12/05/14    Left ribs 30 Gy in 10 fractions    Past Surgical History  Procedure Laterality Date  . Esophagogastroduodenoscopy N/A 09/14/2014    Procedure: ESOPHAGOGASTRODUODENOSCOPY (EGD);  Surgeon: Teena Irani, MD;  Location: [facility];  Service: Endoscopy;  Laterality: N/A;    Family History  Problem Relation Age of Onset  . Cancer Mother   . Heart failure Father     Social History:  reports that she quit smoking about 5 months ago. She does not have any smokeless tobacco history on file. She reports that she does not drink alcohol or use illicit drugs.  Allergies:  Allergies  Allergen Reactions  . Penicillins Rash    Medications: I have reviewed the patient's current medications.  Results for orders placed or performed during the hospital encounter of 02/09/15 (from the past 48 hour(s))  CBC with Differential/Platelet     Status: Abnormal   Collection Time: 02/09/15  4:13 PM  Result Value Ref Range   WBC 10.6 (H) 4.0 - 10.5 K/uL   RBC 4.35 3.87 - 5.11 MIL/uL   Hemoglobin 10.7 (L) 12.0 - 15.0 g/dL   HCT 33.6 (L) 36.0 - 46.0 %   MCV 77.2 (L) 78.0 - 100.0 fL   MCH 24.6 (L) 26.0 - 34.0 pg   MCHC 31.8 30.0 - 36.0 g/dL   RDW 17.2 (H) 11.5 - 15.5 %   Platelets 564 (H) 150 - 400 K/uL   Neutrophils Relative % 86 (H) 43 - 77 %   Neutro Abs 9.2 (H) 1.7 - 7.7 K/uL   Lymphocytes  Relative 4 (L) 12 - 46 %   Lymphs Abs 0.4 (L) 0.7 - 4.0 K/uL   Monocytes Relative 9 3 - 12 %   Monocytes Absolute 0.9 0.1 - 1.0 K/uL   Eosinophils Relative 1 0 - 5 %   Eosinophils Absolute 0.1 0.0 - 0.7 K/uL   Basophils Relative 0 0 - 1 %   Basophils Absolute 0.0 0.0 - 0.1 K/uL  Basic metabolic panel     Status: Abnormal   Collection Time: 02/09/15  4:13 PM  Result Value Ref Range   Sodium 131 (L) 135 - 145 mmol/L   Potassium 3.7 3.5 - 5.1 mmol/L   Chloride 95 (L) 101 - 111 mmol/L   CO2 25 22 - 32 mmol/L   Glucose, Bld 101 (H) 65 - 99 mg/dL   BUN 7 6 - 20 mg/dL   Creatinine, Ser 0.53 0.44 - 1.00 mg/dL   Calcium 9.2 8.9 - 10.3 mg/dL   GFR calc non Af Amer >60 >60 mL/min   GFR calc Af Amer >60 >60 mL/min    Comment: (NOTE) The eGFR has been calculated using the CKD EPI equation. This calculation has not been validated in all clinical situations. eGFR's persistently <60  mL/min signify possible Chronic Kidney Disease.    Anion gap 11 5 - 15    Dg Ribs Unilateral W/chest Left  02/09/2015   CLINICAL DATA:  Fall last Wednesday left mid axillary rib pain.  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 No confluent opacities on the right. No visible rib fracture. Heart is normal size.  IMPRESSION: Continued complete opacification of the left hemithorax as seen on prior chest CT.   Electronically Signed   By: Rolm Baptise M.D.   On: 02/09/2015 14:55   Ct Hip Left Wo Contrast  02/09/2015   CLINICAL DATA:  Status post fall 6 days ago.  Left hip pain.  EXAM: CT OF THE LEFT HIP WITHOUT CONTRAST  TECHNIQUE: Multidetector CT imaging of the left hip was performed according to the standard protocol. Multiplanar CT image reconstructions were also generated.  COMPARISON:  None.  FINDINGS: There is a comminuted, impacted and mildly displaced left  subcapital fracture. There is no other fracture or dislocation. There is no lytic or sclerotic osseous lesion. The superior and inferior pubic rami are intact. The muscles are normal. There is no fluid collection or hematoma.  There is no pelvic free fluid.  Visualized bladder is normal.  IMPRESSION: Comminuted, impacted and mildly displaced left femoral subcapital fracture.   Electronically Signed   By: Kathreen Devoid   On: 02/09/2015 17:04   Dg Hip Unilat W Or W/o Pelvis 2-3 Views Left  02/09/2015   CLINICAL DATA:  Fall, left hip pain  EXAM: DG HIP (WITH OR WITHOUT PELVIS) 2-3V LEFT  COMPARISON:  None.  FINDINGS: Subcapital left hip fracture with foreshortening and varus angulation.  Bilateral hip joint spaces are symmetric.  Visualized bony pelvis appears intact.  IMPRESSION: Subcapital left hip fracture, as above.  These results will be called to the ordering clinician or representative by the Radiology Department at the imaging location.   Electronically Signed   By: Julian Hy M.D.   On: 02/09/2015 14:56    Review of Systems  Constitutional: Positive for weight loss and malaise/fatigue.  Neurological: Positive for weakness.   Blood pressure 113/79, pulse 98, temperature 98.7 F (37.1 C), temperature source Oral, resp. rate 16, SpO2 97 %. Physical Exam  Constitutional: She is oriented to person, place, and time.  Cardiovascular: Normal rate.   GI: Soft.  Musculoskeletal:       Left hip: She exhibits decreased range of motion, decreased strength, tenderness, bony tenderness and deformity.  Neurological: She is alert and oriented to person, place, and time.    Assessment/Plan: Left hip with displaced femoral neck fracture 1)  This actually occurred about a week ago after a mechanical fall.  The CT scan did not show any concern for a lytic lesion around her hip - not a pathologic fracture.  I have spoken to her in length and have recommended a left hip hemiarthroplasty. Will plan on  surgery for late tomorrow afternoon/early evening.  Can be npo after breakfast tomorrow.  Risks and benefits of surgery thoroughly discussed.  Mcarthur Rossetti 02/09/2015, 5:48 PM

## 2015-02-09 NOTE — Telephone Encounter (Signed)
Gave adn printed appt sched and avs for pt for Sept °

## 2015-02-09 NOTE — ED Notes (Addendum)
Dr. Ninfa Linden at bedside and reported plans to do sx tomorrow and pt may eat a Regular diet tonight.

## 2015-02-09 NOTE — ED Notes (Signed)
Patient transported to CT 

## 2015-02-10 ENCOUNTER — Inpatient Hospital Stay (HOSPITAL_COMMUNITY): Payer: 59 | Admitting: Anesthesiology

## 2015-02-10 ENCOUNTER — Inpatient Hospital Stay (HOSPITAL_COMMUNITY): Payer: 59

## 2015-02-10 ENCOUNTER — Encounter (HOSPITAL_COMMUNITY): Payer: Self-pay | Admitting: Registered Nurse

## 2015-02-10 ENCOUNTER — Encounter (HOSPITAL_COMMUNITY)
Admission: EM | Disposition: A | Discharge status: Skilled Nursing Facility | Payer: Self-pay | Source: Home / Self Care | Attending: Internal Medicine

## 2015-02-10 DIAGNOSIS — S72002A Fracture of unspecified part of neck of left femur, initial encounter for closed fracture: Secondary | ICD-10-CM

## 2015-02-10 HISTORY — PX: TOTAL HIP ARTHROPLASTY: SHX124

## 2015-02-10 LAB — SURGICAL PCR SCREEN
MRSA, PCR: NEGATIVE
Staphylococcus aureus: NEGATIVE

## 2015-02-10 LAB — CBC
HEMATOCRIT: 31.9 % — AB (ref 36.0–46.0)
Hemoglobin: 10 g/dL — ABNORMAL LOW (ref 12.0–15.0)
MCH: 24.4 pg — ABNORMAL LOW (ref 26.0–34.0)
MCHC: 31.3 g/dL (ref 30.0–36.0)
MCV: 77.8 fL — ABNORMAL LOW (ref 78.0–100.0)
PLATELETS: 508 10*3/uL — AB (ref 150–400)
RBC: 4.1 MIL/uL (ref 3.87–5.11)
RDW: 17.5 % — AB (ref 11.5–15.5)
WBC: 9.2 10*3/uL (ref 4.0–10.5)

## 2015-02-10 LAB — COMPREHENSIVE METABOLIC PANEL
ALK PHOS: 137 U/L — AB (ref 38–126)
AST: 9 U/L — AB (ref 15–41)
Albumin: 2.4 g/dL — ABNORMAL LOW (ref 3.5–5.0)
Anion gap: 8 (ref 5–15)
BUN: 7 mg/dL (ref 6–20)
CALCIUM: 8.6 mg/dL — AB (ref 8.9–10.3)
CHLORIDE: 98 mmol/L — AB (ref 101–111)
CO2: 24 mmol/L (ref 22–32)
CREATININE: 0.61 mg/dL (ref 0.44–1.00)
GFR calc Af Amer: >60 mL/min (ref >60)
Glucose, Bld: 115 mg/dL — ABNORMAL HIGH (ref 65–99)
Potassium: 3.5 mmol/L (ref 3.5–5.1)
Sodium: 130 mmol/L — ABNORMAL LOW (ref 135–145)
Total Bilirubin: 0.5 mg/dL (ref 0.3–1.2)
Total Protein: 5.6 g/dL — ABNORMAL LOW (ref 6.5–8.1)

## 2015-02-10 LAB — PREPARE RBC (CROSSMATCH)

## 2015-02-10 LAB — HEPARIN LEVEL (UNFRACTIONATED)

## 2015-02-10 LAB — PROTIME-INR
INR: 1.06 (ref 0.00–1.49)
Prothrombin Time: 14 seconds (ref 11.6–15.2)

## 2015-02-10 LAB — ABO/RH: ABO/RH(D): O POS

## 2015-02-10 SURGERY — ARTHROPLASTY, HIP, TOTAL, ANTERIOR APPROACH
Anesthesia: Spinal | Site: Hip | Laterality: Left

## 2015-02-10 MED ORDER — CEFAZOLIN SODIUM-DEXTROSE 2-3 GM-% IV SOLR
2.0000 g | Freq: Once | INTRAVENOUS | Status: AC
  Administered 2015-02-10: 2 g via INTRAVENOUS

## 2015-02-10 MED ORDER — KETAMINE HCL 10 MG/ML IJ SOLN
INTRAMUSCULAR | Status: DC | PRN
  Administered 2015-02-10: 20 mg via INTRAVENOUS
  Administered 2015-02-10: 30 mg via INTRAVENOUS

## 2015-02-10 MED ORDER — PHENYLEPHRINE HCL 10 MG/ML IJ SOLN
INTRAMUSCULAR | Status: DC | PRN
  Administered 2015-02-10: 80 ug via INTRAVENOUS

## 2015-02-10 MED ORDER — KETOROLAC TROMETHAMINE 30 MG/ML IJ SOLN
INTRAMUSCULAR | Status: DC | PRN
  Administered 2015-02-10: 30 mg via INTRAVENOUS

## 2015-02-10 MED ORDER — ONDANSETRON HCL 4 MG PO TABS: 4.0000 mg | ORAL_TABLET | Freq: Four times a day (QID) | ORAL | Status: DC | PRN

## 2015-02-10 MED ORDER — FENTANYL CITRATE (PF) 100 MCG/2ML IJ SOLN
INTRAMUSCULAR | Status: AC
  Filled 2015-02-10: qty 4

## 2015-02-10 MED ORDER — SODIUM CHLORIDE 0.9 % IR SOLN
Status: DC | PRN
  Administered 2015-02-10: 1000 mL

## 2015-02-10 MED ORDER — PROPOFOL 10 MG/ML IV BOLUS
INTRAVENOUS | Status: DC | PRN
  Administered 2015-02-10: 120 mg via INTRAVENOUS

## 2015-02-10 MED ORDER — HYDROMORPHONE HCL 1 MG/ML IJ SOLN
INTRAMUSCULAR | Status: DC | PRN
  Administered 2015-02-10 (×2): 1 mg via INTRAVENOUS

## 2015-02-10 MED ORDER — CEFAZOLIN SODIUM-DEXTROSE 2-3 GM-% IV SOLR
2.0000 g | Freq: Four times a day (QID) | INTRAVENOUS | Status: AC
  Administered 2015-02-10 – 2015-02-11 (×2): 2 g via INTRAVENOUS
  Filled 2015-02-10 (×2): qty 50

## 2015-02-10 MED ORDER — ONDANSETRON HCL 4 MG/2ML IJ SOLN
INTRAMUSCULAR | Status: DC | PRN
Start: 2015-02-10 — End: 2015-02-10
  Administered 2015-02-10: 4 mg via INTRAVENOUS

## 2015-02-10 MED ORDER — MIDAZOLAM HCL 5 MG/5ML IJ SOLN
INTRAMUSCULAR | Status: DC | PRN
  Administered 2015-02-10: 1 mg via INTRAVENOUS
  Administered 2015-02-10: 2 mg via INTRAVENOUS
  Administered 2015-02-10: 1 mg via INTRAVENOUS

## 2015-02-10 MED ORDER — MIDAZOLAM HCL 2 MG/2ML IJ SOLN
INTRAMUSCULAR | Status: AC
  Filled 2015-02-10: qty 4

## 2015-02-10 MED ORDER — ASPIRIN EC 325 MG PO TBEC
325.0000 mg | DELAYED_RELEASE_TABLET | Freq: Every day | ORAL | Status: DC
  Administered 2015-02-11: 325 mg via ORAL
  Filled 2015-02-10 (×2): qty 1

## 2015-02-10 MED ORDER — FENTANYL CITRATE (PF) 100 MCG/2ML IJ SOLN
INTRAMUSCULAR | Status: AC
  Administered 2015-02-10: 25 ug via INTRAVENOUS
  Filled 2015-02-10: qty 2

## 2015-02-10 MED ORDER — ACETAMINOPHEN 325 MG PO TABS: 650.0000 mg | ORAL_TABLET | Freq: Four times a day (QID) | ORAL | Status: DC | PRN

## 2015-02-10 MED ORDER — METHOCARBAMOL 1000 MG/10ML IJ SOLN
500.0000 mg | Freq: Four times a day (QID) | INTRAMUSCULAR | Status: DC | PRN
  Administered 2015-02-10: 500 mg via INTRAVENOUS
  Filled 2015-02-10 (×2): qty 5

## 2015-02-10 MED ORDER — ACETAMINOPHEN 650 MG RE SUPP: 650.0000 mg | Freq: Four times a day (QID) | RECTAL | Status: DC | PRN

## 2015-02-10 MED ORDER — ONDANSETRON HCL 4 MG/2ML IJ SOLN: 4.0000 mg | Freq: Four times a day (QID) | INTRAMUSCULAR | Status: DC | PRN

## 2015-02-10 MED ORDER — ONDANSETRON HCL 4 MG/2ML IJ SOLN
INTRAMUSCULAR | Status: AC
  Filled 2015-02-10: qty 2

## 2015-02-10 MED ORDER — OXYCODONE HCL 5 MG PO TABS
5.0000 mg | ORAL_TABLET | ORAL | Status: DC | PRN
  Administered 2015-02-11 (×2): 10 mg via ORAL
  Administered 2015-02-11 – 2015-02-12 (×2): 15 mg via ORAL
  Administered 2015-02-12 (×3): 10 mg via ORAL
  Administered 2015-02-13 (×2): 15 mg via ORAL
  Administered 2015-02-13 – 2015-02-14 (×3): 10 mg via ORAL
  Filled 2015-02-10: qty 3
  Filled 2015-02-10: qty 2
  Filled 2015-02-10: qty 3
  Filled 2015-02-10: qty 2
  Filled 2015-02-10: qty 3
  Filled 2015-02-10 (×4): qty 2
  Filled 2015-02-10 (×2): qty 3
  Filled 2015-02-10: qty 2

## 2015-02-10 MED ORDER — KETOROLAC TROMETHAMINE 15 MG/ML IJ SOLN
7.5000 mg | Freq: Four times a day (QID) | INTRAMUSCULAR | Status: AC | PRN
  Administered 2015-02-12: 7.5 mg via INTRAVENOUS
  Filled 2015-02-10: qty 1

## 2015-02-10 MED ORDER — ROCURONIUM BROMIDE 100 MG/10ML IV SOLN
INTRAVENOUS | Status: AC
  Filled 2015-02-10: qty 1

## 2015-02-10 MED ORDER — METOCLOPRAMIDE HCL 10 MG PO TABS: 5.0000 mg | ORAL_TABLET | Freq: Three times a day (TID) | ORAL | Status: DC | PRN

## 2015-02-10 MED ORDER — PROPOFOL 10 MG/ML IV BOLUS
INTRAVENOUS | Status: AC
Start: 2015-02-10 — End: 2015-02-10
  Filled 2015-02-10: qty 20

## 2015-02-10 MED ORDER — ACETAMINOPHEN 10 MG/ML IV SOLN
INTRAVENOUS | Status: DC | PRN
  Administered 2015-02-10: 1000 mg via INTRAVENOUS

## 2015-02-10 MED ORDER — PROMETHAZINE HCL 25 MG/ML IJ SOLN: 6.2500 mg | INTRAMUSCULAR | Status: DC | PRN

## 2015-02-10 MED ORDER — FENTANYL 100 MCG/HR TD PT72
100.0000 ug | MEDICATED_PATCH | TRANSDERMAL | Status: DC
  Administered 2015-02-10 – 2015-02-13 (×2): 100 ug via TRANSDERMAL
  Filled 2015-02-10 (×2): qty 1

## 2015-02-10 MED ORDER — DEXAMETHASONE SODIUM PHOSPHATE 10 MG/ML IJ SOLN
INTRAMUSCULAR | Status: DC | PRN
Start: 2015-02-10 — End: 2015-02-10
  Administered 2015-02-10: 10 mg via INTRAVENOUS

## 2015-02-10 MED ORDER — FENTANYL CITRATE (PF) 250 MCG/5ML IJ SOLN
INTRAMUSCULAR | Status: AC
  Filled 2015-02-10: qty 25

## 2015-02-10 MED ORDER — LACTATED RINGERS IV SOLN
INTRAVENOUS | Status: DC | PRN
  Administered 2015-02-10 (×3): via INTRAVENOUS

## 2015-02-10 MED ORDER — METHOCARBAMOL 500 MG PO TABS
500.0000 mg | ORAL_TABLET | Freq: Four times a day (QID) | ORAL | Status: DC | PRN
  Administered 2015-02-11 – 2015-02-14 (×5): 500 mg via ORAL
  Filled 2015-02-10 (×5): qty 1

## 2015-02-10 MED ORDER — LIDOCAINE HCL (CARDIAC) 20 MG/ML IV SOLN
INTRAVENOUS | Status: DC | PRN
  Administered 2015-02-10: 25 mg via INTRATRACHEAL
  Administered 2015-02-10: 75 mg via INTRAVENOUS

## 2015-02-10 MED ORDER — ENOXAPARIN SODIUM 80 MG/0.8ML ~~LOC~~ SOLN
80.0000 mg | SUBCUTANEOUS | Status: DC
  Administered 2015-02-11 – 2015-02-14 (×4): 80 mg via SUBCUTANEOUS
  Filled 2015-02-10 (×4): qty 0.8

## 2015-02-10 MED ORDER — PHENOL 1.4 % MT LIQD: 1.0000 | OROMUCOSAL | Status: DC | PRN

## 2015-02-10 MED ORDER — HYDROMORPHONE HCL 2 MG/ML IJ SOLN
INTRAMUSCULAR | Status: AC
  Filled 2015-02-10: qty 1

## 2015-02-10 MED ORDER — DEXAMETHASONE SODIUM PHOSPHATE 10 MG/ML IJ SOLN
INTRAMUSCULAR | Status: AC
  Filled 2015-02-10: qty 1

## 2015-02-10 MED ORDER — ACETAMINOPHEN 10 MG/ML IV SOLN
INTRAVENOUS | Status: AC
  Filled 2015-02-10: qty 100

## 2015-02-10 MED ORDER — HEPARIN (PORCINE) IN NACL 100-0.45 UNIT/ML-% IJ SOLN
1000.0000 [IU]/h | INTRAMUSCULAR | Status: DC
  Administered 2015-02-10: 1000 [IU]/h via INTRAVENOUS
  Filled 2015-02-10: qty 250

## 2015-02-10 MED ORDER — FENTANYL CITRATE (PF) 100 MCG/2ML IJ SOLN
25.0000 ug | INTRAMUSCULAR | Status: DC | PRN
  Administered 2015-02-10: 50 ug via INTRAVENOUS
  Administered 2015-02-10: 25 ug via INTRAVENOUS

## 2015-02-10 MED ORDER — ZOLPIDEM TARTRATE 5 MG PO TABS: 5.0000 mg | ORAL_TABLET | Freq: Every evening | ORAL | Status: DC | PRN

## 2015-02-10 MED ORDER — MENTHOL 3 MG MT LOZG: 1.0000 | LOZENGE | OROMUCOSAL | Status: DC | PRN

## 2015-02-10 MED ORDER — CEFAZOLIN SODIUM-DEXTROSE 2-3 GM-% IV SOLR
INTRAVENOUS | Status: AC
  Filled 2015-02-10: qty 50

## 2015-02-10 MED ORDER — FENTANYL CITRATE (PF) 100 MCG/2ML IJ SOLN
INTRAMUSCULAR | Status: DC | PRN
  Administered 2015-02-10: 100 ug via INTRAVENOUS
  Administered 2015-02-10 (×6): 50 ug via INTRAVENOUS

## 2015-02-10 MED ORDER — SUCCINYLCHOLINE CHLORIDE 20 MG/ML IJ SOLN
INTRAMUSCULAR | Status: DC | PRN
  Administered 2015-02-10: 80 mg via INTRAVENOUS

## 2015-02-10 MED ORDER — ALUM & MAG HYDROXIDE-SIMETH 200-200-20 MG/5ML PO SUSP
30.0000 mL | ORAL | Status: DC | PRN
Start: 2015-02-10 — End: 2015-02-14

## 2015-02-10 MED ORDER — KETAMINE HCL 10 MG/ML IJ SOLN
INTRAMUSCULAR | Status: AC
  Filled 2015-02-10: qty 1

## 2015-02-10 MED ORDER — LIDOCAINE HCL (CARDIAC) 20 MG/ML IV SOLN
INTRAVENOUS | Status: AC
  Filled 2015-02-10: qty 5

## 2015-02-10 MED ORDER — METOCLOPRAMIDE HCL 5 MG/ML IJ SOLN: 5.0000 mg | Freq: Three times a day (TID) | INTRAMUSCULAR | Status: DC | PRN

## 2015-02-10 MED ORDER — HYDROMORPHONE HCL 1 MG/ML IJ SOLN
1.0000 mg | INTRAMUSCULAR | Status: DC | PRN
  Administered 2015-02-10 – 2015-02-13 (×2): 1 mg via INTRAVENOUS
  Filled 2015-02-10 (×2): qty 1

## 2015-02-10 SURGICAL SUPPLY — 43 items
BAG ZIPLOCK 12X15 (MISCELLANEOUS) IMPLANT
BENZOIN TINCTURE PRP APPL 2/3 (GAUZE/BANDAGES/DRESSINGS) IMPLANT
BLADE SAW SGTL 18X1.27X75 (BLADE) ×2 IMPLANT
BLADE SAW SGTL 18X1.27X75MM (BLADE) ×1
CAPT HIP HEMI 2 ×3 IMPLANT
CELLS DAT CNTRL 66122 CELL SVR (MISCELLANEOUS) ×1 IMPLANT
CLOSURE WOUND 1/2 X4 (GAUZE/BANDAGES/DRESSINGS)
COVER PERINEAL POST (MISCELLANEOUS) ×3 IMPLANT
DRAPE C-ARM 42X120 X-RAY (DRAPES) ×3 IMPLANT
DRAPE STERI IOBAN 125X83 (DRAPES) ×3 IMPLANT
DRAPE U-SHAPE 47X51 STRL (DRAPES) ×9 IMPLANT
DRSG AQUACEL AG ADV 3.5X10 (GAUZE/BANDAGES/DRESSINGS) ×3 IMPLANT
DRSG XEROFORM 1X8 (GAUZE/BANDAGES/DRESSINGS) ×3 IMPLANT
DURAPREP 26ML APPLICATOR (WOUND CARE) ×3 IMPLANT
ELECT BLADE TIP CTD 4 INCH (ELECTRODE) ×3 IMPLANT
ELECT REM PT RETURN 9FT ADLT (ELECTROSURGICAL) ×3
ELECTRODE REM PT RTRN 9FT ADLT (ELECTROSURGICAL) ×1 IMPLANT
FACESHIELD WRAPAROUND (MASK) ×12 IMPLANT
GAUZE XEROFORM 1X8 LF (GAUZE/BANDAGES/DRESSINGS) IMPLANT
GLOVE BIO SURGEON STRL SZ7.5 (GLOVE) ×3 IMPLANT
GLOVE BIOGEL PI IND STRL 8 (GLOVE) ×2 IMPLANT
GLOVE BIOGEL PI INDICATOR 8 (GLOVE) ×4
GLOVE ECLIPSE 8.0 STRL XLNG CF (GLOVE) ×3 IMPLANT
GOWN STRL REUS W/TWL XL LVL3 (GOWN DISPOSABLE) ×6 IMPLANT
HANDPIECE INTERPULSE COAX TIP (DISPOSABLE) ×2
KIT BASIN OR (CUSTOM PROCEDURE TRAY) ×3 IMPLANT
PACK TOTAL JOINT (CUSTOM PROCEDURE TRAY) ×3 IMPLANT
PEN SKIN MARKING BROAD (MISCELLANEOUS) ×3 IMPLANT
RTRCTR WOUND ALEXIS 18CM MED (MISCELLANEOUS) ×3
SET HNDPC FAN SPRY TIP SCT (DISPOSABLE) ×1 IMPLANT
STAPLER VISISTAT 35W (STAPLE) IMPLANT
STRIP CLOSURE SKIN 1/2X4 (GAUZE/BANDAGES/DRESSINGS) IMPLANT
SUT ETHIBOND NAB CT1 #1 30IN (SUTURE) ×3 IMPLANT
SUT MNCRL AB 4-0 PS2 18 (SUTURE) IMPLANT
SUT VIC AB 0 CT1 36 (SUTURE) ×3 IMPLANT
SUT VIC AB 1 CT1 36 (SUTURE) ×3 IMPLANT
SUT VIC AB 2-0 CT1 27 (SUTURE) ×4
SUT VIC AB 2-0 CT1 TAPERPNT 27 (SUTURE) ×2 IMPLANT
TOWEL OR 17X26 10 PK STRL BLUE (TOWEL DISPOSABLE) ×3 IMPLANT
TOWEL OR NON WOVEN STRL DISP B (DISPOSABLE) ×3 IMPLANT
TRAY FOLEY W/METER SILVER 14FR (SET/KITS/TRAYS/PACK) ×3 IMPLANT
TRAY FOLEY W/METER SILVER 16FR (SET/KITS/TRAYS/PACK) IMPLANT
YANKAUER SUCT BULB TIP 10FT TU (MISCELLANEOUS) ×3 IMPLANT

## 2015-02-10 NOTE — Progress Notes (Signed)
Utilization review completed.  

## 2015-02-10 NOTE — Clinical Social Work Note (Signed)
Clinical Social Work Assessment  Patient Details  Name: [name] MRN: [id number] Date of Birth: 07-17-1965  Date of referral:  02/10/15               Reason for consult:  Discharge Planning                Permission sought to share information with:  Chartered certified accountant granted to share information::  Yes, Verbal Permission Granted  Name::        Agency::     Relationship::     Contact Information:     Housing/Transportation Living arrangements for the past 2 months:  Single Family Home Source of Information:  Patient Patient Interpreter Needed:  None Criminal Activity/Legal Involvement Pertinent to Current Situation/Hospitalization:  No - Comment as needed Significant Relationships:  Siblings Lives with:    Do you feel safe going back to the place where you live?  Yes Need for family participation in patient care:  No (Coment)  Care giving concerns:  Pt reports she will need to be fairly independent using a walker in order to return home at d/c.    Social Worker assessment / plan:  Pt hospitalized on 02/09/15 with a hip fx. Pt is having hip surgery today. Pt has been receiving treatment for cancer. CSW met with pt to assist with d/c planning. Pt hopes to return home at d/c but will consider ST Rehab , if needed. CSW will meet again with pt following surgery and PT recommendations. Pt has [employer] which requires prior authorization. CSW will assist with authorization process if needed.  Employment status:  Unemployed Forensic scientist:  Managed Care PT Recommendations:  Not assessed at this time Information / Referral to community resources:  [place]  Patient/Family's Response to care:  Pt's disposition has not yet been determined.  Patient/Family's Understanding of and Emotional Response to Diagnosis, Current Treatment, and Prognosis:  Pt has a good understanding of her medical status. Pt reports that she has one  more chemo treatment. Pt is hopeful she will be able to return home at d/c.  Emotional Assessment Appearance:  Appears stated age Attitude/Demeanor/Rapport:  Other (cooperative) Affect (typically observed):  Pleasant, Calm Orientation:  Oriented to Self, Oriented to Place, Oriented to  Time, Oriented to Situation Alcohol / Substance use:  Not Applicable Psych involvement (Current and /or in the community):  No (Comment)  Discharge Needs  Concerns to be addressed:  Discharge Planning Concerns Readmission within the last 30 days:  No Current discharge risk:  None Barriers to Discharge:  No Barriers Identified   Luretha Rued, [place] 02/10/2015, 4:01 PM

## 2015-02-10 NOTE — Anesthesia Procedure Notes (Signed)
Procedure Name: Intubation Date/Time: 02/10/2015 4:55 PM Performed by: Lissa Morales Pre-anesthesia Checklist: Patient identified, Emergency Drugs available, Suction available and Patient being monitored Patient Re-evaluated:Patient Re-evaluated prior to inductionOxygen Delivery Method: Circle System Utilized Preoxygenation: Pre-oxygenation with 100% oxygen Intubation Type: IV induction Ventilation: Mask ventilation without difficulty Laryngoscope Size: Mac and 3 Grade View: Grade II Tube type: Oral Tube size: 7.0 mm Number of attempts: 1 Airway Equipment and Method: Stylet and Oral airway Placement Confirmation: ETT inserted through vocal cords under direct vision,  positive ETCO2 and breath sounds checked- equal and bilateral Secured at: 7 cm Tube secured with: Tape Dental Injury: Teeth and Oropharynx as per pre-operative assessment

## 2015-02-10 NOTE — Anesthesia Postprocedure Evaluation (Signed)
  Anesthesia Post-op Note  Patient: [name]  Procedure(s) Performed: Procedure(s) (LRB): TOTAL HIP ARTHROPLASTY ANTERIOR APPROACH/  LEFT HIP BIPOLAR HEMI ARTHROPLASTY (Left)  Patient Location: PACU  Anesthesia Type: General  Level of Consciousness: awake and alert   Airway and Oxygen Therapy: Patient Spontanous Breathing  Post-op Pain: mild  Post-op Assessment: Post-op Vital signs reviewed, Patient's Cardiovascular Status Stable, Respiratory Function Stable, Patent Airway and No signs of Nausea or vomiting  Last Vitals:  Filed Vitals:   02/10/15 2000  BP: 134/78  Pulse: 89  Temp:   Resp: 18    Post-op Vital Signs: stable   Complications: No apparent anesthesia complications

## 2015-02-10 NOTE — Progress Notes (Signed)
Patient ID: [name], female   DOB: 02-Jun-1966, 49 y.o.   MRN: [id number] I have spoken with her and her family again and it is understood that we are proceeding to the OR this afternoon for a left hip hemiarthroplasty to treat her displaced left hip femoral neck fracture.  Informed consent is obtained after a discussion of the surgery as well as the risks and benefits involved.

## 2015-02-10 NOTE — Brief Op Note (Signed)
02/09/2015 - 02/10/2015  5:52 PM  PATIENT:  [name]  49 y.o. female  PRE-OPERATIVE DIAGNOSIS:  fractured left hip  POST-OPERATIVE DIAGNOSIS:  fractured left hip  PROCEDURE:  Procedure(s): TOTAL HIP ARTHROPLASTY ANTERIOR APPROACH/  LEFT HIP BIPOLAR HEMI ARTHROPLASTY (Left)  SURGEON:  Surgeon(s) and Role:    * Mcarthur Rossetti, MD - Primary  PHYSICIAN ASSISTANT: Benita Stabile, PA-C  ANESTHESIA:   general  EBL:  Total I/O In: 2233 [I.V.:1475] Out: 525 [Urine:375; Blood:150]  BLOOD ADMINISTERED:none  DRAINS: none   LOCAL MEDICATIONS USED:  NONE  SPECIMEN:  No Specimen  DISPOSITION OF SPECIMEN:  N/A  COUNTS:  YES  TOURNIQUET:  * No tourniquets in log *  DICTATION: .Other Dictation: Dictation Number [id number]  PLAN OF CARE: Admit to inpatient   PATIENT DISPOSITION:  PACU - hemodynamically stable.   Delay start of Pharmacological VTE agent (>24hrs) due to surgical blood loss or risk of bleeding: no

## 2015-02-10 NOTE — Progress Notes (Signed)
Pt sleepy. Unable to perform neuro assessment at this time.

## 2015-02-10 NOTE — Anesthesia Preprocedure Evaluation (Addendum)
Anesthesia Evaluation  Patient identified by MRN, date of birth, ID band Patient awake    Reviewed: Allergy & Precautions, NPO status , Patient's Chart, lab work & pertinent test results  Airway Mallampati: II  TM Distance: <3 FB Neck ROM: Full    Dental  (+) Poor Dentition, Missing   Pulmonary former smoker,  Lung cancer breath sounds clear to auscultation  Pulmonary exam normal       Cardiovascular Exercise Tolerance: Good DVT Normal cardiovascular examRhythm:Regular Rate:Normal     Neuro/Psych negative neurological ROS  negative psych ROS   GI/Hepatic negative GI ROS, Neg liver ROS,   Endo/Other  negative endocrine ROS  Renal/GU negative Renal ROS     Musculoskeletal Cachectic   Abdominal   Peds  Hematology negative hematology ROS (+)   Anesthesia Other Findings Day of surgery medications reviewed with the patient.  Reproductive/Obstetrics                            Anesthesia Physical Anesthesia Plan  ASA: III  Anesthesia Plan: Spinal   Post-op Pain Management:    Induction:   Airway Management Planned: Nasal Cannula  Additional Equipment:   Intra-op Plan:   Post-operative Plan:   Informed Consent: I have reviewed the patients History and Physical, chart, labs and discussed the procedure including the risks, benefits and alternatives for the proposed anesthesia with the patient or authorized representative who has indicated his/her understanding and acceptance.   Dental advisory given  Plan Discussed with:   Anesthesia Plan Comments:         Anesthesia Quick Evaluation

## 2015-02-10 NOTE — Progress Notes (Signed)
Patient in OR, chart review vital stable, labs unremarkable.

## 2015-02-10 NOTE — Progress Notes (Signed)
ANTICOAGULATION CONSULT NOTE - Follow Up Consult  Pharmacy Consult for Heparin Indication: hx of SVC thrombus  Allergies  Allergen Reactions  . Penicillins Rash    Patient Measurements: Height: '5\' 7"'$  (170.2 cm) Weight: 114 lb 6.7 oz (51.9 kg) (weight from 01/26/15- too weak to stand today) IBW/kg (Calculated) : 61.6 Heparin Dosing Weight:   Vital Signs: Temp: 98.1 F (36.7 C) (08/22 2042) Temp Source: Oral (08/22 2042) BP: 111/76 mmHg (08/22 2042) Pulse Rate: 91 (08/22 2042)  Labs:  Recent Labs  02/09/15 1056 02/09/15 1057 02/09/15 1613 02/10/15 0325  HGB 10.0*  --  10.7* 10.0*  HCT 30.3*  --  33.6* 31.9*  PLT 702*  --  564* 508*  LABPROT  --   --   --  14.0  INR  --   --   --  1.06  HEPARINUNFRC  --   --   --  <0.10*  CREATININE  --  0.6 0.53 0.61    Estimated Creatinine Clearance: 71.2 mL/min (by C-G formula based on Cr of 0.61).   Medications:  Infusions:  . dextrose 5 % and 0.45% NaCl 50 mL/hr at 02/10/15 0041  . heparin      Assessment: Patient with low heparin level.  No heparin issues per RN.  Goal of Therapy:  Heparin level 0.3-0.7 units/ml Monitor platelets by anticoagulation protocol: Yes   Plan:  Increase heparin to 1000 units/hr Recheck level at [address] Shea Stakes [place] 02/10/2015,5:37 AM

## 2015-02-10 NOTE — Transfer of Care (Signed)
Immediate Anesthesia Transfer of Care Note  Patient: [name]  Procedure(s) Performed: Procedure(s): TOTAL HIP ARTHROPLASTY ANTERIOR APPROACH/  LEFT HIP BIPOLAR HEMI ARTHROPLASTY (Left)  Patient Location: PACU  Anesthesia Type:General  Level of Consciousness: sedated  Airway & Oxygen Therapy: Patient Spontanous Breathing and Patient connected to face mask oxygen  Post-op Assessment: Report given to RN, Post -op Vital signs reviewed and stable and Patient moving all extremities X 4  Post vital signs: stable  Last Vitals:  Filed Vitals:   02/10/15 1811  BP: 135/79  Pulse: 80  Temp: 36.6 C  Resp:     Complications: No apparent anesthesia complications

## 2015-02-10 NOTE — Progress Notes (Signed)
Spoke with Dr. Lorin Mercy (on-call for Dr. Ninfa Linden) regarding post-op anticoagulation plan.  Orders received to resume full-dose Lovenox 1.'5mg'$ /kg sq q24h tomorrow at 10:00 AM.  No heparin bridge tonight.  Peggyann Juba, PharmD, BCPS Pager: [phone] 02/10/2015 9:40 PM

## 2015-02-11 ENCOUNTER — Encounter (HOSPITAL_COMMUNITY): Payer: Self-pay | Admitting: Orthopaedic Surgery

## 2015-02-11 DIAGNOSIS — I82290 Acute embolism and thrombosis of other thoracic veins: Secondary | ICD-10-CM

## 2015-02-11 LAB — BASIC METABOLIC PANEL
Anion gap: 2 — ABNORMAL LOW (ref 5–15)
BUN: 6 mg/dL (ref 6–20)
CALCIUM: 8.1 mg/dL — AB (ref 8.9–10.3)
CO2: 27 mmol/L (ref 22–32)
CREATININE: 0.47 mg/dL (ref 0.44–1.00)
Chloride: 101 mmol/L (ref 101–111)
GFR calc Af Amer: >60 mL/min (ref >60)
GLUCOSE: 126 mg/dL — AB (ref 65–99)
POTASSIUM: 3.7 mmol/L (ref 3.5–5.1)
SODIUM: 130 mmol/L — AB (ref 135–145)

## 2015-02-11 LAB — PREPARE RBC (CROSSMATCH)

## 2015-02-11 LAB — CBC
HCT: 24.5 % — ABNORMAL LOW (ref 36.0–46.0)
Hemoglobin: 7.6 g/dL — ABNORMAL LOW (ref 12.0–15.0)
MCH: 24.1 pg — AB (ref 26.0–34.0)
MCHC: 31 g/dL (ref 30.0–36.0)
MCV: 77.8 fL — ABNORMAL LOW (ref 78.0–100.0)
PLATELETS: 408 10*3/uL — AB (ref 150–400)
RBC: 3.15 MIL/uL — AB (ref 3.87–5.11)
RDW: 17.5 % — AB (ref 11.5–15.5)
WBC: 12.7 10*3/uL — AB (ref 4.0–10.5)

## 2015-02-11 LAB — MAGNESIUM: MAGNESIUM: 1.5 mg/dL — AB (ref 1.7–2.4)

## 2015-02-11 MED ORDER — SODIUM CHLORIDE 0.9 % IV SOLN: Freq: Once | INTRAVENOUS | Status: AC

## 2015-02-11 NOTE — Op Note (Signed)
NAMEJONEISHA, MILES NO.:  [id number]  MEDICAL RECORD NO.:  [id number]  LOCATION:  [place]                         FACILITY:  [facility]  PHYSICIAN:  Lind Guest. Ninfa Linden, M.D.DATE OF BIRTH:  Oct 19, 1965  DATE OF PROCEDURE:  02/10/2015 DATE OF DISCHARGE:                              OPERATIVE REPORT   PREOPERATIVE DIAGNOSIS:  Left hip displaced femoral neck fracture.  POSTOPERATIVE DIAGNOSIS:  Left hip displaced femoral neck fracture.  PROCEDURE:  Left hip bipolar hemiarthroplasty.  IMPLANTS:  DePuy Corail femoral component, size 11 with standard offset, size 45 unipolar head with +1 28 bipolar component.  SURGEON:  Lind Guest. Ninfa Linden, M.D.  ASSISTANT:  Erskine Emery, PA-C.  ANESTHESIA:  General.  ANTIBIOTICS:  2 g of IV Ancef.  BLOOD LOSS:  150-200 mL.  COMPLICATIONS:  None.  INDICATIONS:  [name] is a 49 year old with severe end-stage lung cancer.  She sustained a mechanical fall about a week ago on her home and had difficulty ambulating using her walker.  She saw her oncologist yesterday, who then referred to the emergency room for x-rays and x-ray showed a displaced left hip femoral neck fracture.  She has had obvious shortening and Orthopedic Surgery was consulted.  She was admitted to the Medicine Service.  I talked to her and her family about the surgical versus nonsurgical options.  She is someone who has definitely have a significant amount of pain and even in light of her cancer, quality of life is definitely important.  I have recommended a bipolar hemiarthroplasty through direct anterior approach.  The risks and benefits of the surgery were explained to her in detail and she did understand the reason behind proceeding with surgery.  PROCEDURE DESCRIPTION:  After informed consent was obtained, appropriate left hip was marked.  She was brought to the operating room.  General anesthesia was obtained while she was on the stretcher.   Traction boots were placed on both of her feet.  Next, she was placed supine on the Hana fracture table with perineal post in place and both legs in inline skeletal traction devices, but no traction applied.  The left operative hip was prepped and draped with DuraPrep and sterile drapes.  Time-out was called and she was identified as correct patient and correct left hip.  I then made an incision just inferior and posterior to the anterior superior iliac spine and carried this obliquely down the leg. We dissected down to the tensor fascia lata muscle and tensor fascia was then divided longitudinally, so we could proceed with a direct anterior approach to the hip.  We identified and cauterized the lateral femoral circumflex vessels and then identified our femoral neck.  We placed the Cobra retractors around the femoral neck and then opened up our hip capsule, finding a large hematoma consistent with a fracture.  We then placed the Cobra retractors within the hip capsule.  We then made our femoral neck cut with an oscillating saw just distal to her fracture, completed this with an osteotome.  We placed a corkscrew guide in the femoral head and removed the femoral head in its entirety and measured this for a size 45 femoral head.  We then  cleaned the acetabulum of bone fragments and the fovea and then, I turned attention to the femur.  With the leg externally rotated to 100 degrees, extended and adducted, we were able to use a Mueller retractor medially and a Hohmann retractor behind the greater trochanter.  We released the lateral joint capsule, used a box cutting osteotome to enter the femoral canal and a rongeur to lateralize.  We then began broaching from a size 8 broach using the Corail broaching system from DePuy up to a size 11.  With the size 11, we trialed a 45/28+ 1 femoral head with a standard neck and reduced this in the acetabulum.  We were pleased with leg lengths and stability  with range of motion.  We then dislocated the hip and removed the trial components.  We placed the real size 11 Corail femoral component with the size 11 with standard offset and the real 45/28+ 1 hip ball.  We reduced this into the acetabulum and again it was stable.  We then copiously irrigated the soft tissue with normal saline solution and the joint using pulsatile lavage.  We closed the joint capsule with interrupted #1 Ethibond suture followed by running #1 Vicryl in the tensor fascia, 0 Vicryl in the deep tissue, 2-0 Vicryl in the subcutaneous tissue, and staples on the skin.  Xeroform and Aquacel dressing were applied.  She was taken off the Hana table, awakened, extubated and taken to the recovery room in stable condition.  Of note, Erskine Emery, PA-C assisted in the entire case and his assistance was crucial for facilitating all aspects of this case.     Lind Guest. Ninfa Linden, M.D.     CYB/MEDQ  D:  02/10/2015  T:  02/11/2015  Job:  [id number]

## 2015-02-11 NOTE — Progress Notes (Signed)
PT Cancellation Note  Patient Details Name: [name] MRN: [id number] DOB: 1965-06-21   Cancelled Treatment:     PT eval order received but deferred x 2 this am 2* pt fatigue/nausea and ongoing blood transfusion.  Will continue to follow and progress as pt tolerates.   Alessandra Sawdey 02/11/2015, 2:23 PM

## 2015-02-11 NOTE — Progress Notes (Signed)
Initial Nutrition Assessment  DOCUMENTATION CODES:   Severe malnutrition in context of chronic illness  INTERVENTION:  Patient declined supplements at this time.   NUTRITION DIAGNOSIS:   Malnutrition related to chronic illness, poor appetite as evidenced by per patient/family report, moderate depletions of muscle mass, moderate depletion of body fat, percent weight loss.    GOAL:   Patient will meet greater than or equal to 90% of their needs    MONITOR:   PO intake, Skin, I & O's, Weight trends, Labs  REASON FOR ASSESSMENT:   Other (Comment) (Low BMI)    ASSESSMENT:   PT presents with metastatic lung cancer, undergoing chemo and radiation therapy, exhibits poor po intake due to lack of appetite, taste changes.  48yowf exhibits severe malnutrition with 18#/13% wt loss over 3 mos. Severe muscle/fat depletion throughout body.  Pt has poor appetite related to chemo & radiation therapy. Family states it fluctuates. Pt enjoys cheese, will provide snacks to accommodate, hopefully increase PO intake.   Diet Order:  Diet regular Room service appropriate?: Yes; Fluid consistency:: Thin  Skin:  Wound (see comment) (Surgical incision to left hip.)  Last BM:  02/08/2015  Height:   Ht Readings from Last 1 Encounters:  02/09/15 '5\' 7"'$  (1.702 m)    Weight:   Wt Readings from Last 1 Encounters:  02/09/15 114 lb 6.7 oz (51.9 kg)    Ideal Body Weight:  61.36 kg  BMI:  Body mass index is 17.92 kg/(m^2).  Estimated Nutritional Needs:   Kcal:  1600-1800  Protein:  60-80  Fluid:  >=/ 1.6L  EDUCATION NEEDS:   No education needs identified at this time  Natalie Anis. Bianey Tesoro, MS, RD LDN After Hours/Weekend Pager [phone]

## 2015-02-11 NOTE — Progress Notes (Signed)
PROGRESS NOTE  [name] ZOX:[id number] DOB: [birth year] DOA: 02/09/2015 PCP: No primary care provider on file.  HPI: With metastatic squamous cell lung cancer, sustained a fall a few days ago, with left hip pain, she was seen at the cancer center for received immunotherapy, hip x ray showed left hip fracture, she was referred to the ER.   Subjective / 24 H Interval events - feeling better, ongoing pain at the hip  Assessment/Plan: Active Problems:   Squamous cell lung cancer   Thrombosis of superior vena cava   Protein-calorie malnutrition, severe   Subcapital fracture of left hip   Closed left hip fracture   Subcapital fracture of Left hip - management per orthopedic surgery, she is s/p TOTAL HIP ARTHROPLASTY ANTERIOR APPROACH/ LEFT HIP BIPOLAR HEMI ARTHROPLASTY on 8/23 - PT evaluation pending  Acute blood loss anemia - Hb with drop to 7.6 from 10, transfuse 2U pRBC today   Chronic cancer pain - continue fentanyl160mgpatchq3d, mscontin '45mg'$  q8hr,MSIR '30mg'$  q4hr prn.  - Continue miralax/senokot-s.  SVC thrombosis (diganosed 08/2014)  - continue Lovenox  Metastatic squamous cell lung cancer:  - outpatient oncology follow up, currently on immunotherapy with Nivolumab q2wk ,last dose 8/22.   Diet: Diet regular Room service appropriate?: Yes; Fluid consistency:: Thin Fluids: none DVT Prophylaxis: Lovenox  Code Status: Full Code Family Communication: no family bedside  Disposition Plan: remain inpatient  Consultants:  Orthopedic surgery   Procedures:  THA left 8/23   Antibiotics  Anti-infectives    Start     Dose/Rate Route Frequency Ordered Stop   02/10/15 2200  ceFAZolin (ANCEF) IVPB 2 g/50 mL premix     2 g 100 mL/hr over 30 Minutes Intravenous Every 6 hours 02/10/15 2052 02/11/15 0500   02/10/15 1645  ceFAZolin (ANCEF) IVPB 2 g/50 mL premix     2 g 100 mL/hr over 30 Minutes Intravenous  Once 02/10/15 1639 02/10/15 1646      Studies  Dg  Ribs Unilateral W/chest Left  02/09/2015   CLINICAL DATA:  Fall last Wednesday left mid axillary rib pain.  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 No confluent opacities on the right. No visible rib fracture. Heart is normal size.  IMPRESSION: Continued complete opacification of the left hemithorax as seen on prior chest CT.   Electronically Signed   By: KRolm BaptiseM.D.   On: 02/09/2015 14:55   Pelvis Portable  02/10/2015   CLINICAL DATA:  Patient status post left hip hemiarthroplasty.  EXAM: PORTABLE PELVIS 1-2 VIEWS  COMPARISON:  CT pelvis 02/09/2015  FINDINGS: Patient status post left hip hemiarthroplasty. AP portable pelvic images demonstrate hardware to be in appropriate position. Overlying surgical staple line. Soft tissue gas compatible with postoperative state.  IMPRESSION: Patient status post left hip hemiarthroplasty.   Electronically Signed   By: DLovey NewcomerM.D.   On: 02/10/2015 18:47   Ct Hip Left Wo Contrast  02/09/2015   CLINICAL DATA:  Status post fall 6 days ago.  Left hip pain.  EXAM: CT OF THE LEFT HIP WITHOUT CONTRAST  TECHNIQUE: Multidetector CT imaging of the left hip was performed according to the standard protocol. Multiplanar CT image reconstructions were also generated.  COMPARISON:  None.  FINDINGS: There is a comminuted, impacted and mildly displaced left subcapital fracture. There is no other fracture  or dislocation. There is no lytic or sclerotic osseous lesion. The superior and inferior pubic rami are intact. The muscles are normal. There is no fluid collection or hematoma.  There is no pelvic free fluid.  Visualized bladder is normal.  IMPRESSION: Comminuted, impacted and mildly displaced left femoral subcapital fracture.   Electronically Signed   By: Kathreen Devoid   On: 02/09/2015 17:04   Dg C-arm 1-60 Min-no  Report  02/10/2015   CLINICAL DATA: surgery   C-ARM 1-60 MINUTES  Fluoroscopy was utilized by the requesting physician.  No radiographic  interpretation.    Dg Hip Operative Unilat With Pelvis Left  02/10/2015   CLINICAL DATA:  Patient with left hip fracture.  EXAM: OPERATIVE LEFT HIP (WITH PELVIS IF PERFORMED) 2 VIEWS  TECHNIQUE: Fluoroscopic spot image(s) were submitted for interpretation post-operatively.  COMPARISON:  Head CT 10/22/2014; left hip CT 02/09/2015  FINDINGS: Patient status post left hip arthroplasty. Hardware appears in appropriate position.  IMPRESSION: Patient status post left hip arthroplasty.   Electronically Signed   By: Lovey Newcomer M.D.   On: 02/10/2015 18:01   Dg Hip Unilat W Or W/o Pelvis 2-3 Views Left  02/09/2015   CLINICAL DATA:  Fall, left hip pain  EXAM: DG HIP (WITH OR WITHOUT PELVIS) 2-3V LEFT  COMPARISON:  None.  FINDINGS: Subcapital left hip fracture with foreshortening and varus angulation.  Bilateral hip joint spaces are symmetric.  Visualized bony pelvis appears intact.  IMPRESSION: Subcapital left hip fracture, as above.  These results will be called to the ordering clinician or representative by the Radiology Department at the imaging location.   Electronically Signed   By: Julian Hy M.D.   On: 02/09/2015 14:56   Objective  Filed Vitals:   02/11/15 0958 02/11/15 1028 02/11/15 1232 02/11/15 1247  BP: 120/77 132/79 118/76 131/80  Pulse: 84 80 86 83  Temp: 98.1 F (36.7 C) 97.9 F (36.6 C) 98.1 F (36.7 C) 98.1 F (36.7 C)  TempSrc: Oral Oral Oral Oral  Resp: '14 14 14 14  '$ Height:      Weight:      SpO2: 95% 100% 100% 100%    Intake/Output Summary (Last 24 hours) at 02/11/15 1317 Last data filed at 02/11/15 1238  Gross per 24 hour  Intake 3814.17 ml  Output   1225 ml  Net 2589.17 ml   Filed Weights   02/09/15 1700  Weight: 51.9 kg (114 lb 6.7 oz)   Exam:  GENERAL: NAD  HEENT: head NCAT, no scleral icterus.   NECK: Supple. No  carotid bruits.  LUNGS: Clear to auscultation. No wheezing or crackles  HEART: Regular rate and rhythm without murmur. 2+ pulses, no JVD, no peripheral edema  ABDOMEN: Soft, nontender, and nondistended. Positive bowel sounds.   EXTREMITIES: Without any cyanosis, clubbing, rash, lesions or edema.  NEUROLOGIC: Alert and oriented x3. Strength 5/5 in all 4.  Data Reviewed: Basic Metabolic Panel:  Recent Labs Lab 02/09/15 1057 02/09/15 1613 02/10/15 0325 02/11/15 0443  NA 131* 131* 130* 130*  K 3.7 3.7 3.5 3.7  CL  --  95* 98* 101  CO2 '23 25 24 27  '$ GLUCOSE 117 101* 115* 126*  BUN 5.3* '7 7 6  '$ CREATININE 0.6 0.53 0.61 0.47  CALCIUM 9.2 9.2 8.6* 8.1*  MG  --   --   --  1.5*   Liver Function Tests:  Recent Labs Lab 02/09/15 1057 02/10/15 0325  AST 7 9*  ALT <6 <5*  ALKPHOS 150 137*  BILITOT 0.41 0.5  PROT 5.5* 5.6*  ALBUMIN 2.2* 2.4*   CBC:  Recent Labs Lab 02/09/15 1056 02/09/15 1613 02/10/15 0325 02/11/15 0443  WBC 9.5 10.6* 9.2 12.7*  NEUTROABS 8.2* 9.2*  --   --   HGB 10.0* 10.7* 10.0* 7.6*  HCT 30.3* 33.6* 31.9* 24.5*  MCV 74.6* 77.2* 77.8* 77.8*  PLT 702* 564* 508* 408*   BNP (last 3 results)  Recent Labs  09/22/14 0405  BNP 301.5*    Recent Results (from the past 240 hour(s))  Surgical pcr screen     Status: None   Collection Time: 02/10/15  3:52 PM  Result Value Ref Range Status   MRSA, PCR NEGATIVE NEGATIVE Final   Staphylococcus aureus NEGATIVE NEGATIVE Final    Comment:        The Xpert SA Assay (FDA approved for NASAL specimens in patients over 63 years of age), is one component of a comprehensive surveillance program.  Test performance has been validated by [facility] for patients greater than or equal to 33 year old. It is not intended to diagnose infection nor to guide or monitor treatment.      Scheduled Meds: . antiseptic oral rinse  7 mL Mouth Rinse q12n4p  . chlorhexidine  15 mL Mouth Rinse BID  . DULoxetine  30 mg  Oral Daily  . enoxaparin (LOVENOX) injection  80 mg Subcutaneous Q24H  . fentaNYL  100 mcg Transdermal Q72H  . morphine  15 mg Oral TID  . morphine  30 mg Oral 3 times per day  . polyethylene glycol  17 g Oral Daily  . senna-docusate  1 tablet Oral BID   Continuous Infusions: . dextrose 5 % and 0.45% NaCl 50 mL/hr at 02/10/15 2135    Marzetta Board, MD [facility] Pager [phone]. If 7 PM - 7 AM, please contact night-coverage at [email or web address], password [facility] 02/11/2015, 1:17 PM  LOS: 2 days

## 2015-02-11 NOTE — Progress Notes (Addendum)
ANTICOAGULATION CONSULT NOTE   Pharmacy Consult for Lovenox Indication: hx SVC thrombus  Allergies  Allergen Reactions  . Penicillins Rash    Patient Measurements: Height: '5\' 7"'$  (170.2 cm) Weight: 114 lb 6.7 oz (51.9 kg) (weight from 01/26/15- too weak to stand today) IBW/kg (Calculated) : 61.6  Vital Signs: Temp: 97.9 F (36.6 C) (08/24 0612) Temp Source: Oral (08/24 0612) BP: 131/86 mmHg (08/24 0612) Pulse Rate: 78 (08/24 0612)  Labs:  Recent Labs  02/09/15 1613 02/10/15 0325 02/11/15 0443  HGB 10.7* 10.0* 7.6*  HCT 33.6* 31.9* 24.5*  PLT 564* 508* 408*  LABPROT  --  14.0  --   INR  --  1.06  --   HEPARINUNFRC  --  <0.10*  --   CREATININE 0.53 0.61 0.47    Estimated Creatinine Clearance: 71.2 mL/min (by C-G formula based on Cr of 0.47).   Medical History: Past Medical History  Diagnosis Date  . Cancer     left bronchioles  . Radiation 11/24/14-12/05/14    Left ribs 30 Gy in 10 fractions    Assessment: 49 yo F who is receiving treatment for metastatic lung CA presented at [facility] 8/22 for Opdivo infusion and complaint of left hip pain from a fall a few days ago.  Xray + left hip fracture.  She has been on Lovenox '80mg'$  (1.'5mg'$ /kg) q24h since March 2016 for SVC thrombosis.  Last injection was 8/21 @ 7pm.   Patient underwent L hip hemiarthroplasty 8/23 for which patient was bridged with heparin infusion perioperatively. Heparin infusion d/c'ed at 1300 8/23 prior to procedure. Patient now being resumed on lovenox therapy.  Today, 02/11/2015  SCr 0.47 with CrCl ~ 71 ml/min CG  CBC: Hgb down to 7.6 (from 10 pre-op), Pltc elevated  Patient to receive 2 units PRBCs today  No bleeding issues noted per nursing/PA   Goal of Therapy:  Anti-Xa level 0.6-1 units/ml 4hrs after LMWH dose given Monitor platelets by anticoagulation protocol: Yes   Plan:   Resume patient's home dose of Lovenox 80 mg (1.5 mg/kg) SQ daily.  D/C ASA per d/w Dr. Ninfa Linden.  Monitor renal  function, CBC, and for s/s of bleeding.   Lindell Spar, PharmD, BCPS Pager: [phone] 02/11/2015 8:57 AM

## 2015-02-11 NOTE — Progress Notes (Signed)
Subjective: 1 Day Post-Op Procedure(s) (LRB): TOTAL HIP ARTHROPLASTY ANTERIOR APPROACH/  LEFT HIP BIPOLAR HEMI ARTHROPLASTY (Left) Patient reports pain as moderate.  States the burning pain she was having pre-op is gone.  Objective: Vital signs in last 24 hours: Temp:  [96.9 F (36.1 C)-98.5 F (36.9 C)] 97.9 F (36.6 C) (08/24 0612) Pulse Rate:  [78-97] 78 (08/24 0612) Resp:  [12-21] 14 (08/24 0612) BP: (123-140)/(72-93) 131/86 mmHg (08/24 0612) SpO2:  [92 %-100 %] 100 % (08/24 0612)  Intake/Output from previous day: 08/23 0701 - 08/24 0700 In: 3980.8 [P.O.:540; I.V.:3340.8; IV Piggyback:100] Out: 1025 [Urine:875; Blood:150] Intake/Output this shift:     Recent Labs  02/09/15 1056 02/09/15 1613 02/10/15 0325 02/11/15 0443  HGB 10.0* 10.7* 10.0* 7.6*    Recent Labs  02/10/15 0325 02/11/15 0443  WBC 9.2 12.7*  RBC 4.10 3.15*  HCT 31.9* 24.5*  PLT 508* 408*    Recent Labs  02/10/15 0325 02/11/15 0443  NA 130* 130*  K 3.5 3.7  CL 98* 101  CO2 24 27  BUN 7 6  CREATININE 0.61 0.47  GLUCOSE 115* 126*  CALCIUM 8.6* 8.1*    Recent Labs  02/10/15 0325  INR 1.06   Left lower extremity:  Sensation intact distally Intact pulses distally Dorsiflexion/Plantar flexion intact Incision: scant drainage Compartment soft  Assessment/Plan: 1 Day Post-Op Procedure(s) (LRB): TOTAL HIP ARTHROPLASTY ANTERIOR APPROACH/  LEFT HIP BIPOLAR HEMI ARTHROPLASTY (Left) Post-op anemia secondary to surgery primary team giving PRBC's today   [name], [place] 02/11/2015, 7:59 AM

## 2015-02-11 NOTE — Progress Notes (Signed)
OT Cancellation Note  Patient Details Name: [name] MRN: [id number] DOB: Feb 08, 1966   Cancelled Treatment:    Reason Eval/Treat Not Completed: Other (comment) Pt getting blood.  Spoke with RN. Will attempt eval in afternoon.    Kari Baars, [place]  Payton Mccallum D 02/11/2015, 10:17 AM

## 2015-02-12 DIAGNOSIS — D62 Acute posthemorrhagic anemia: Secondary | ICD-10-CM

## 2015-02-12 LAB — TYPE AND SCREEN
ABO/RH(D): O POS
Antibody Screen: NEGATIVE
UNIT DIVISION: 0
Unit division: 0

## 2015-02-12 LAB — BASIC METABOLIC PANEL
Anion gap: 6 (ref 5–15)
CHLORIDE: 98 mmol/L — AB (ref 101–111)
CO2: 26 mmol/L (ref 22–32)
CREATININE: 0.51 mg/dL (ref 0.44–1.00)
Calcium: 8.9 mg/dL (ref 8.9–10.3)
GFR calc Af Amer: >60 mL/min (ref >60)
GFR calc non Af Amer: >60 mL/min (ref >60)
Glucose, Bld: 94 mg/dL (ref 65–99)
Potassium: 4.6 mmol/L (ref 3.5–5.1)
Sodium: 130 mmol/L — ABNORMAL LOW (ref 135–145)

## 2015-02-12 LAB — CBC
HEMATOCRIT: 32.1 % — AB (ref 36.0–46.0)
HEMOGLOBIN: 10.5 g/dL — AB (ref 12.0–15.0)
MCH: 25.3 pg — AB (ref 26.0–34.0)
MCHC: 32.7 g/dL (ref 30.0–36.0)
MCV: 77.3 fL — AB (ref 78.0–100.0)
Platelets: 401 10*3/uL — ABNORMAL HIGH (ref 150–400)
RBC: 4.15 MIL/uL (ref 3.87–5.11)
RDW: 17.1 % — ABNORMAL HIGH (ref 11.5–15.5)
WBC: 10.8 10*3/uL — ABNORMAL HIGH (ref 4.0–10.5)

## 2015-02-12 NOTE — Evaluation (Addendum)
Physical Therapy Evaluation Patient Details Name: [name] MRN: [id number] DOB: May 17, 1966 Today's Date: 02/12/2015   History of Present Illness  pt has a h/o metastatic lung CA who fell and sustained a subcapital fx of L hip; s/p L hemiarthroplasty  Clinical Impression  Pt s/p L hip fx with hemiarthroplasty repair and ongoing metastatic lung CA presents with decreased L LE strength/ROM and post op pain limiting functional mobility.  Pt would benefit from follow up rehab at SNF level to maximize IND and safety prior to return home with ltd assist.    Follow Up Recommendations SNF    Equipment Recommendations  None recommended by PT    Recommendations for Other Services OT consult     Precautions / Restrictions Precautions Precautions: Fall Restrictions Weight Bearing Restrictions: No LLE Weight Bearing: Weight bearing as tolerated      Mobility  Bed Mobility Overal bed mobility: Needs Assistance;+2 for physical assistance Bed Mobility: Supine to Sit     Supine to sit: Min assist;+2 for safety/equipment Sit to supine: Mod assist;+2 for physical assistance   General bed mobility comments: cues for sequence; assist for LLE and light assistance for trunk to sit up.  Assist for legs and trunk to lie back down  Transfers                 General transfer comment: NT - pt ltd by increased pain in sitting and requesting back to bed  Ambulation/Gait                Stairs            Wheelchair Mobility    Modified Rankin (Stroke Patients Only)       Balance                                             Pertinent Vitals/Pain Pain Assessment: Faces Faces Pain Scale: Hurts whole lot Pain Location: Trunk/L side chest. Pain Intervention(s): Limited activity within patient's tolerance;Premedicated before session;Monitored during session;Patient requesting pain meds-RN notified    Home Living Family/patient expects to be  discharged to:: Private residence Living Arrangements: Other relatives Available Help at Discharge: Family Type of Home: House Home Access: Stairs to enter Entrance Stairs-Rails: Right Entrance Stairs-Number of Steps: 3 Home Layout: Two level;Bed/bath upstairs Home Equipment: Bedside commode;Grab bars - toilet;Hand held shower head;Walker - 2 wheels Additional Comments: just moved in with brother   recently    Prior Function Level of Independence: Independent               Hand Dominance        Extremity/Trunk Assessment   Upper Extremity Assessment: Overall WFL for tasks assessed           Lower Extremity Assessment: LLE deficits/detail   LLE Deficits / Details: Strength at hip 2/5 with AAROM to 80 flex and 15 abd     Communication   Communication: No difficulties  Cognition Arousal/Alertness: Awake/alert Behavior During Therapy: WFL for tasks assessed/performed Overall Cognitive Status: Within Functional Limits for tasks assessed                      General Comments      Exercises Total Joint Exercises Ankle Circles/Pumps: AROM;Both;15 reps;Supine Quad Sets: AROM;Both;10 reps;Supine Heel Slides: AAROM;Left;15 reps;Supine Hip ABduction/ADduction: AAROM;10 reps;Left;Supine      Assessment/Plan  PT Assessment Patient needs continued PT services  PT Diagnosis Difficulty walking   PT Problem List Decreased strength;Decreased range of motion;Decreased activity tolerance;Decreased mobility;Decreased knowledge of use of DME;Pain  PT Treatment Interventions DME instruction;Gait training;Stair training;Therapeutic activities;Functional mobility training;Therapeutic exercise;Patient/family education   PT Goals (Current goals can be found in the Care Plan section) Acute Rehab PT Goals Patient Stated Goal: get back to being independent and decreased pain PT Goal Formulation: With patient Time For Goal Achievement: 02/19/15 Potential to Achieve  Goals: Fair    Frequency 7X/week   Barriers to discharge        Co-evaluation PT/OT/SLP Co-Evaluation/Treatment: Yes Reason for Co-Treatment: For patient/therapist safety PT goals addressed during session: Mobility/safety with mobility OT goals addressed during session: ADL's and self-care       End of Session Equipment Utilized During Treatment: Gait belt Activity Tolerance: Patient limited by pain Patient left: in bed;with call bell/phone within reach Nurse Communication: Mobility status         Time: 9373-4287 PT Time Calculation (min) (ACUTE ONLY): 34 min   Charges:   PT Evaluation $Initial PT Evaluation Tier I: 1 Procedure     PT G Codes:        Maitland Muhlbauer 28-Feb-2015, 4:07 PM

## 2015-02-12 NOTE — Discharge Instructions (Signed)
Full weight bearing as tolerated left leg; only up with assistance. You can get your current dressing wet daily in the shower. Ice as needed for swelling. INSTRUCTIONS AFTER JOINT REPLACEMENT   o Remove items at home which could result in a fall. This includes throw rugs or furniture in walking pathways o ICE to the affected joint every three hours while awake for 30 minutes at a time, for at least the first 3-5 days, and then as needed for pain and swelling.  Continue to use ice for pain and swelling. You may notice swelling that will progress down to the foot and ankle.  This is normal after surgery.  Elevate your leg when you are not up walking on it.   o Continue to use the breathing machine you got in the hospital (incentive spirometer) which will help keep your temperature down.  It is common for your temperature to cycle up and down following surgery, especially at night when you are not up moving around and exerting yourself.  The breathing machine keeps your lungs expanded and your temperature down.   DIET:  As you were doing prior to hospitalization, we recommend a well-balanced diet.  DRESSING / WOUND CARE / SHOWERING  Keep the surgical dressing until follow up.  The dressing is water proof, so you can shower without any extra covering.  IF THE DRESSING FALLS OFF or the wound gets wet inside, change the dressing with sterile gauze.  Please use good hand washing techniques before changing the dressing.  Do not use any lotions or creams on the incision until instructed by your surgeon.    ACTIVITY  o Increase activity slowly as tolerated, but follow the weight bearing instructions below.   o No driving for 6 weeks or until further direction given by your physician.  You cannot drive while taking narcotics.  o No lifting or carrying greater than 10 lbs. until further directed by your surgeon. o Avoid periods of inactivity such as sitting longer than an hour when not asleep. This helps  prevent blood clots.  o You may return to work once you are authorized by your doctor.     WEIGHT BEARING   Weight bearing as tolerated with assist device (walker, cane, etc) as directed, use it as long as suggested by your surgeon or therapist, typically at least 4-6 weeks. Left leg weight bearing as tolerated no hip precautions.    EXERCISES  Results after joint replacement surgery are often greatly improved when you follow the exercise, range of motion and muscle strengthening exercises prescribed by your doctor. Safety measures are also important to protect the joint from further injury. Any time any of these exercises cause you to have increased pain or swelling, decrease what you are doing until you are comfortable again and then slowly increase them. If you have problems or questions, call your caregiver or physical therapist for advice.   Rehabilitation is important following a joint replacement. After just a few days of immobilization, the muscles of the leg can become weakened and shrink (atrophy).  These exercises are designed to build up the tone and strength of the thigh and leg muscles and to improve motion. Often times heat used for twenty to thirty minutes before working out will loosen up your tissues and help with improving the range of motion but do not use heat for the first two weeks following surgery (sometimes heat can increase post-operative swelling).   These exercises can be done on a  training (exercise) mat, on the floor, on a table or on a bed. Use whatever works the best and is most comfortable for you.    Use music or television while you are exercising so that the exercises are a pleasant break in your day. This will make your life better with the exercises acting as a break in your routine that you can look forward to.   Perform all exercises about fifteen times, three times per day or as directed.  You should exercise both the operative leg and the other leg as  well.  Exercises include:    Quad Sets - Tighten up the muscle on the front of the thigh (Quad) and hold for 5-10 seconds.    Straight Leg Raises - With your knee straight (if you were given a brace, keep it on), lift the leg to 60 degrees, hold for 3 seconds, and slowly lower the leg.  Perform this exercise against resistance later as your leg gets stronger.   Leg Slides: Lying on your back, slowly slide your foot toward your buttocks, bending your knee up off the floor (only go as far as is comfortable). Then slowly slide your foot back down until your leg is flat on the floor again.   Angel Wings: Lying on your back spread your legs to the side as far apart as you can without causing discomfort.   Hamstring Strength:  Lying on your back, push your heel against the floor with your leg straight by tightening up the muscles of your buttocks.  Repeat, but this time bend your knee to a comfortable angle, and push your heel against the floor.  You may put a pillow under the heel to make it more comfortable if necessary.   A rehabilitation program following joint replacement surgery can speed recovery and prevent re-injury in the future due to weakened muscles. Contact your doctor or a physical therapist for more information on knee rehabilitation.    CONSTIPATION  Constipation is defined medically as fewer than three stools per week and severe constipation as less than one stool per week.  Even if you have a regular bowel pattern at home, your normal regimen is likely to be disrupted due to multiple reasons following surgery.  Combination of anesthesia, postoperative narcotics, change in appetite and fluid intake all can affect your bowels.   YOU MUST use at least one of the following options; they are listed in order of increasing strength to get the job done.  They are all available over the counter, and you may need to use some, POSSIBLY even all of these options:    Drink plenty of fluids  (prune juice may be helpful) and high fiber foods Colace 100 mg by mouth twice a day  Senokot for constipation as directed and as needed Dulcolax (bisacodyl), take with full glass of water  Miralax (polyethylene glycol) once or twice a day as needed.  If you have tried all these things and are unable to have a bowel movement in the first 3-4 days after surgery call either your surgeon or your primary doctor.    If you experience loose stools or diarrhea, hold the medications until you stool forms back up.  If your symptoms do not get better within 1 week or if they get worse, check with your doctor.  If you experience "the worst abdominal pain ever" or develop nausea or vomiting, please contact the office immediately for further recommendations for treatment.   ITCHING:  If you experience itching with your medications, try taking only a single pain pill, or even half a pain pill at a time.  You can also use Benadryl over the counter for itching or also to help with sleep.   TED HOSE STOCKINGS:  Use stockings on both legs until for at least 2 weeks or as directed by physician office. They may be removed at night for sleeping.  MEDICATIONS:  See your medication summary on the After Visit Summary that nursing will review with you.  You may have some home medications which will be placed on hold until you complete the course of blood thinner medication.  It is important for you to complete the blood thinner medication as prescribed.  PRECAUTIONS:  If you experience chest pain or shortness of breath - call 911 immediately for transfer to the hospital emergency department.   If you develop a fever greater that 101 F, purulent drainage from wound, increased redness or drainage from wound, foul odor from the wound/dressing, or calf pain - CONTACT YOUR SURGEON.                                                   FOLLOW-UP APPOINTMENTS:  If you do not already have a post-op appointment, please call the  office for an appointment to be seen by your surgeon.  Guidelines for how soon to be seen are listed in your After Visit Summary, but are typically between 1-4 weeks after surgery.  OTHER INSTRUCTIONS:   Knee Replacement:  Do not place pillow under knee, focus on keeping the knee straight while resting. CPM instructions: 0-90 degrees, 2 hours in the morning, 2 hours in the afternoon, and 2 hours in the evening. Place foam block, curve side up under heel at all times except when in CPM or when walking.  DO NOT modify, tear, cut, or change the foam block in any way.  MAKE SURE YOU:   Understand these instructions.   Get help right away if you are not doing well or get worse.    Thank you for letting us be a part of your medical care team.  It is a privilege we respect greatly.  We hope these instructions will help you stay on track for a fast and full recovery!

## 2015-02-12 NOTE — Progress Notes (Signed)
PROGRESS NOTE  [name] ZSW:[id number] DOB: 02/15/1966 DOA: 02/09/2015 PCP: No primary care provider on file.  HPI: With metastatic squamous cell lung cancer, sustained a fall a few days ago, with left hip pain, she was seen at the cancer center for received immunotherapy, hip x ray showed left hip fracture, she was referred to the ER.   Subjective / 24 H Interval events - felt quite weak last night and could not work with PT, improved today - ongoing hip pain  Assessment/Plan: Active Problems:   Squamous cell lung cancer   Thrombosis of superior vena cava   Protein-calorie malnutrition, severe   Subcapital fracture of left hip   Closed left hip fracture  Subcapital fracture of Left hip - management per orthopedic surgery, she is s/p TOTAL HIP ARTHROPLASTY ANTERIOR APPROACH/ LEFT HIP BIPOLAR HEMI ARTHROPLASTY on 8/23 - PT evaluation pending, could not work last night  Acute blood loss anemia - Hb with drop to 7.6 from 10 s/p transfusion 2U pRBC on 8/24 - Hb improved to 10.5 today, ,continue to monitor  Chronic cancer pain - continue fentanyl155mgpatchq3d, mscontin '45mg'$  q8hr,MSIR '30mg'$  q4hr prn.  - Continue miralax/senokot-s.  SVC thrombosis (diganosed 08/2014)  - continue Lovenox  Metastatic squamous cell lung cancer:  - outpatient oncology follow up, currently on immunotherapy with Nivolumab q2wk ,last dose 8/22.   Diet: Diet regular Room service appropriate?: Yes; Fluid consistency:: Thin Fluids: none DVT Prophylaxis: Lovenox  Code Status: Full Code Family Communication: no family bedside  Disposition Plan: remain inpatient  Consultants:  Orthopedic surgery   Procedures:  THA left 8/23   Antibiotics  Anti-infectives    Start     Dose/Rate Route Frequency Ordered Stop   02/10/15 2200  ceFAZolin (ANCEF) IVPB 2 g/50 mL premix     2 g 100 mL/hr over 30 Minutes Intravenous Every 6 hours 02/10/15 2052 02/11/15 0500   02/10/15 1645  ceFAZolin  (ANCEF) IVPB 2 g/50 mL premix     2 g 100 mL/hr over 30 Minutes Intravenous  Once 02/10/15 1639 02/10/15 1646      Studies  Pelvis Portable  02/10/2015   CLINICAL DATA:  Patient status post left hip hemiarthroplasty.  EXAM: PORTABLE PELVIS 1-2 VIEWS  COMPARISON:  CT pelvis 02/09/2015  FINDINGS: Patient status post left hip hemiarthroplasty. AP portable pelvic images demonstrate hardware to be in appropriate position. Overlying surgical staple line. Soft tissue gas compatible with postoperative state.  IMPRESSION: Patient status post left hip hemiarthroplasty.   Electronically Signed   By: DLovey NewcomerM.D.   On: 02/10/2015 18:47   Dg C-arm 1-60 Min-no Report  02/10/2015   CLINICAL DATA: surgery   C-ARM 1-60 MINUTES  Fluoroscopy was utilized by the requesting physician.  No radiographic  interpretation.    Dg Hip Operative Unilat With Pelvis Left  02/10/2015   CLINICAL DATA:  Patient with left hip fracture.  EXAM: OPERATIVE LEFT HIP (WITH PELVIS IF PERFORMED) 2 VIEWS  TECHNIQUE: Fluoroscopic spot image(s) were submitted for interpretation post-operatively.  COMPARISON:  Head CT 10/22/2014; left hip CT 02/09/2015  FINDINGS: Patient status post left hip arthroplasty. Hardware appears in appropriate position.  IMPRESSION: Patient status post left hip arthroplasty.   Electronically Signed   By: DLovey NewcomerM.D.   On: 02/10/2015 18:01   Objective  Filed Vitals:   02/11/15 1247 02/11/15 1513 02/11/15 2025 02/12/15 0501  BP: 131/80 135/80 121/78 124/78  Pulse: 83 86 85 88  Temp: 98.1 F (36.7 C)  98.1 F (36.7 C) 98.4 F (36.9 C) 98.2 F (36.8 C)  TempSrc: Oral Oral Oral Oral  Resp: '14 14 16 16  '$ Height:      Weight:      SpO2: 100% 100% 100% 100%    Intake/Output Summary (Last 24 hours) at 02/12/15 1143 Last data filed at 02/12/15 1023  Gross per 24 hour  Intake 1478.33 ml  Output   1350 ml  Net 128.33 ml   Filed Weights   02/09/15 1700  Weight: 51.9 kg (114 lb 6.7 oz)    Exam:  GENERAL: NAD  HEENT: head NCAT, no scleral icterus.   NECK: Supple. No carotid bruits.  LUNGS: Clear to auscultation. No wheezing or crackles  HEART: Regular rate and rhythm without murmur. 2+ pulses, no JVD, no peripheral edema  ABDOMEN: Soft, nontender, and nondistended. Positive bowel sounds.   Data Reviewed: Basic Metabolic Panel:  Recent Labs Lab 02/09/15 1057 02/09/15 1613 02/10/15 0325 02/11/15 0443 02/12/15 0456  NA 131* 131* 130* 130* 130*  K 3.7 3.7 3.5 3.7 4.6  CL  --  95* 98* 101 98*  CO2 '23 25 24 27 26  '$ GLUCOSE 117 101* 115* 126* 94  BUN 5.3* '7 7 6 '$ <5*  CREATININE 0.6 0.53 0.61 0.47 0.51  CALCIUM 9.2 9.2 8.6* 8.1* 8.9  MG  --   --   --  1.5*  --    Liver Function Tests:  Recent Labs Lab 02/09/15 1057 02/10/15 0325  AST 7 9*  ALT <6 <5*  ALKPHOS 150 137*  BILITOT 0.41 0.5  PROT 5.5* 5.6*  ALBUMIN 2.2* 2.4*   CBC:  Recent Labs Lab 02/09/15 1056 02/09/15 1613 02/10/15 0325 02/11/15 0443 02/12/15 0456  WBC 9.5 10.6* 9.2 12.7* 10.8*  NEUTROABS 8.2* 9.2*  --   --   --   HGB 10.0* 10.7* 10.0* 7.6* 10.5*  HCT 30.3* 33.6* 31.9* 24.5* 32.1*  MCV 74.6* 77.2* 77.8* 77.8* 77.3*  PLT 702* 564* 508* 408* 401*   BNP (last 3 results)  Recent Labs  09/22/14 0405  BNP 301.5*    Recent Results (from the past 240 hour(s))  Surgical pcr screen     Status: None   Collection Time: 02/10/15  3:52 PM  Result Value Ref Range Status   MRSA, PCR NEGATIVE NEGATIVE Final   Staphylococcus aureus NEGATIVE NEGATIVE Final    Comment:        The Xpert SA Assay (FDA approved for NASAL specimens in patients over 40 years of age), is one component of a comprehensive surveillance program.  Test performance has been validated by [facility] for patients greater than or equal to 2 year old. It is not intended to diagnose infection nor to guide or monitor treatment.      Scheduled Meds: . antiseptic oral rinse  7 mL Mouth Rinse q12n4p  .  chlorhexidine  15 mL Mouth Rinse BID  . DULoxetine  30 mg Oral Daily  . enoxaparin (LOVENOX) injection  80 mg Subcutaneous Q24H  . fentaNYL  100 mcg Transdermal Q72H  . morphine  15 mg Oral TID  . morphine  30 mg Oral 3 times per day  . polyethylene glycol  17 g Oral Daily  . senna-docusate  1 tablet Oral BID   Continuous Infusions: . dextrose 5 % and 0.45% NaCl 50 mL/hr at 02/10/15 2135    Marzetta Board, MD [facility] Pager [phone]. If 7 PM - 7 AM, please contact night-coverage at [email or web address],  password [facility] 02/12/2015, 11:43 AM  LOS: 3 days

## 2015-02-12 NOTE — Care Management Note (Signed)
Case Management Note  Patient Details  Name: Kale Dols MRN: [id number] Date of Birth: [birth year]  Subjective/Objective:                   TOTAL HIP ARTHROPLASTY ANTERIOR APPROACH/ LEFT HIP BIPOLAR HEMI ARTHROPLASTY (Left) Action/Plan: Discharge planning  Expected Discharge Date:   (unknown)               Expected Discharge Plan:  [place]  In-House Referral:     Discharge planning Services  CM Consult  Post Acute Care Choice:    Choice offered to:     DME Arranged:    DME Agency:     HH Arranged:    HH Agency:  [employer]  Status of Service:  In process, will continue to follow  Medicare Important Message Given:    Date Medicare IM Given:    Medicare IM give by:    Date Additional Medicare IM Given:    Additional Medicare Important Message give by:     If discussed at [place] of Stay Meetings, dates discussed:    Additional Comments: CM met with pt in room and discussed home health.  Pt is active with [facility] for HHPT.  CM called [facility] rep, Sheppard Evens and explained decision has not been made as to disposition and Sheppard Evens will follow.  Pt has rolling walker and 3n1 at home.  Pt MAY go to SNF for rehab; however, if pt goes home resumption HHPT orders need to be placed and [facility] will need to be notified of discharge: [phone]. CM will continue to follow for disposition. Dellie Catholic, RN 02/12/2015, 2:32 PM

## 2015-02-12 NOTE — Evaluation (Addendum)
Occupational Therapy Evaluation Patient Details Name: [name] MRN: [id number] DOB: [birth year] Today's Date: 02/12/2015    History of Present Illness pt has a h/o metastatic lung CA who fell and sustained a subcapital fx of L hip; s/p L hemiarthroplasty   Clinical Impression   This 49 year old female was admitted for the above.  At baseline, she was independent with adls.  At time of evaluation, she was limited by trunk pain and could only tolerate sitting EOB briefly.  She will benefit from skilled OT to increase safety and independence with adls.  Goals in acute are for overall min A. She plans to return to her brother's home with his assistance after discharge.     Follow Up Recommendations  SNF unless pt makes significant progress and can return to brother's   Equipment Recommendations  None recommended by OT (likely)    Recommendations for Other Services       Precautions / Restrictions Precautions Precautions: Fall Restrictions LLE Weight Bearing: Weight bearing as tolerated      Mobility Bed Mobility Overal bed mobility: Needs Assistance Bed Mobility: Supine to Sit     Supine to sit: Min assist;+2 for safety/equipment Sit to supine: Mod assist;+2 for physical assistance   General bed mobility comments: cues for sequence; assist for LLE and light assistance for trunk to sit up.  Assist for legs and trunk to lie back down  Transfers                 General transfer comment: pt could not tolerate today    Balance                                            ADL Overall ADL's : Needs assistance/impaired                                       General ADL Comments: pt sat EOB but felt dizzy.  BP 142/84.  Trunk pain became worse and pt requested to lie back down.  clinical judgment for ADLs from bed level are:  set up for grooming and UB adls and mod A for LB bathing/max A for LB dressing.  Pt does have a  reacher:  explained ADL uses for this.     Vision     Perception     Praxis      Pertinent Vitals/Pain Pain Assessment: Faces Faces Pain Scale: Hurts whole lot Pain Location: trunk/L side of chest Pain Intervention(s): Limited activity within patient's tolerance;Monitored during session;Premedicated before session;Repositioned     Hand Dominance     Extremity/Trunk Assessment Upper Extremity Assessment Upper Extremity Assessment: Overall WFL for tasks assessed (L shoulder painful; )           Communication Communication Communication: No difficulties   Cognition Arousal/Alertness: Awake/alert Behavior During Therapy: WFL for tasks assessed/performed Overall Cognitive Status: Within Functional Limits for tasks assessed                     General Comments       Exercises       Shoulder Instructions      Home Living Family/patient expects to be discharged to:: Private residence Living Arrangements: Other relatives (brother)  Bathroom Shower/Tub: Risk analyst characteristics: Architectural technologist: Handicapped height     Home Equipment: Bedside commode;Grab bars - toilet;Hand held shower head          Prior Functioning/Environment Level of Independence: Independent             OT Diagnosis: Generalized weakness;Acute pain   OT Problem List: Decreased strength;Decreased activity tolerance;Decreased knowledge of use of DME or AE;Pain   OT Treatment/Interventions: Self-care/ADL training;DME and/or AE instruction;Patient/family education;Balance training    OT Goals(Current goals can be found in the care plan section) Acute Rehab OT Goals Patient Stated Goal: get back to being independent and decreased pain OT Goal Formulation: With patient Time For Goal Achievement: 02/19/15 Potential to Achieve Goals: Good ADL Goals Pt Will Perform Lower Body Bathing: with min assist;with adaptive equipment;sit  to/from stand Pt Will Perform Lower Body Dressing: with min assist;with adaptive equipment;sit to/from stand (pants) Pt Will Transfer to Toilet: with min assist;bedside commode;stand pivot transfer Pt Will Perform Toileting - Clothing Manipulation and hygiene: with min assist;sit to/from stand  OT Frequency: Min 2X/week   Barriers to D/C:            Co-evaluation PT/OT/SLP Co-Evaluation/Treatment: Yes Reason for Co-Treatment: For patient/therapist safety PT goals addressed during session: Mobility/safety with mobility OT goals addressed during session: ADL's and self-care      End of Session    Activity Tolerance: Patient limited by pain Patient left: in bed;with call bell/phone within reach   Time: 1191-4782 OT Time Calculation (min): 18 min Charges:  OT General Charges $OT Visit: 1 Procedure OT Evaluation $Initial OT Evaluation Tier I: 1 Procedure G-Codes:    Danyal Adorno 03/10/2015, 12:50 PM  Lesle Chris, OTR/L [phone] 03-10-15

## 2015-02-12 NOTE — Progress Notes (Signed)
Subjective: 2 Days Post-Op Procedure(s) (LRB):   LEFT HIP BIPOLAR HEMI ARTHROPLASTY (Left) Patient reports pain as moderate .  Has not been up with PT today.  Objective: Vital signs in last 24 hours: Temp:  [98.1 F (36.7 C)-98.4 F (36.9 C)] 98.2 F (36.8 C) (08/25 0501) Pulse Rate:  [83-88] 88 (08/25 0501) Resp:  [14-16] 16 (08/25 0501) BP: (118-135)/(76-80) 124/78 mmHg (08/25 0501) SpO2:  [100 %] 100 % (08/25 0501)  Intake/Output from previous day: 08/24 0701 - 08/25 0700 In: 1478.3 [P.O.:120; I.V.:758.3; Blood:600] Out: 1425 [Urine:1425] Intake/Output this shift: Total I/O In: -  Out: 25 [Urine:25]   Recent Labs  02/09/15 1613 02/10/15 0325 02/11/15 0443 02/12/15 0456  HGB 10.7* 10.0* 7.6* 10.5*    Recent Labs  02/11/15 0443 02/12/15 0456  WBC 12.7* 10.8*  RBC 3.15* 4.15  HCT 24.5* 32.1*  PLT 408* 401*    Recent Labs  02/11/15 0443 02/12/15 0456  NA 130* 130*  K 3.7 4.6  CL 101 98*  CO2 27 26  BUN 6 <5*  CREATININE 0.47 0.51  GLUCOSE 126* 94  CALCIUM 8.1* 8.9    Recent Labs  02/10/15 0325  INR 1.06    Sensation intact distally Intact pulses distally Dorsiflexion/Plantar flexion intact Incision: scant drainage Compartment soft  Assessment/Plan: 2 Days Post-Op Procedure(s) (LRB):   LEFT HIP BIPOLAR HEMI ARTHROPLASTY (Left)  Hgb improved s/p PRBC's Up with therapy  Left hip dressing changed today Weight bearing as tolerated left leg Follow up with Dr. Ninfa Linden in office in 2 weeks.  [name], [place] 02/12/2015, 11:40 AM

## 2015-02-13 ENCOUNTER — Encounter (HOSPITAL_COMMUNITY): Payer: Self-pay | Admitting: Orthopedic Surgery

## 2015-02-13 DIAGNOSIS — E871 Hypo-osmolality and hyponatremia: Secondary | ICD-10-CM

## 2015-02-13 LAB — BASIC METABOLIC PANEL
Anion gap: 4 — ABNORMAL LOW (ref 5–15)
BUN: 6 mg/dL (ref 6–20)
CO2: 27 mmol/L (ref 22–32)
CREATININE: 0.37 mg/dL — AB (ref 0.44–1.00)
Calcium: 8.5 mg/dL — ABNORMAL LOW (ref 8.9–10.3)
Chloride: 96 mmol/L — ABNORMAL LOW (ref 101–111)
GFR calc Af Amer: >60 mL/min (ref >60)
GLUCOSE: 98 mg/dL (ref 65–99)
POTASSIUM: 3.5 mmol/L (ref 3.5–5.1)
SODIUM: 127 mmol/L — AB (ref 135–145)

## 2015-02-13 LAB — CBC
HCT: 30.9 % — ABNORMAL LOW (ref 36.0–46.0)
Hemoglobin: 10 g/dL — ABNORMAL LOW (ref 12.0–15.0)
MCH: 24.7 pg — AB (ref 26.0–34.0)
MCHC: 32.4 g/dL (ref 30.0–36.0)
MCV: 76.3 fL — AB (ref 78.0–100.0)
PLATELETS: 414 10*3/uL — AB (ref 150–400)
RBC: 4.05 MIL/uL (ref 3.87–5.11)
RDW: 17 % — ABNORMAL HIGH (ref 11.5–15.5)
WBC: 9.9 10*3/uL (ref 4.0–10.5)

## 2015-02-13 MED ORDER — FENTANYL 100 MCG/HR TD PT72: 100.0000 ug | MEDICATED_PATCH | TRANSDERMAL | Status: DC

## 2015-02-13 MED ORDER — DICLOFENAC SODIUM 50 MG PO TBEC
50.0000 mg | DELAYED_RELEASE_TABLET | Freq: Two times a day (BID) | ORAL | Status: DC
  Administered 2015-02-13 – 2015-02-14 (×3): 50 mg via ORAL
  Filled 2015-02-13 (×4): qty 1

## 2015-02-13 MED ORDER — SODIUM CHLORIDE 0.9 % IV SOLN: INTRAVENOUS | Status: DC

## 2015-02-13 NOTE — Progress Notes (Signed)
CSW assisting with d/c planning. [employer] has provided authorization for ST Rehab at [facility]. MD is hoping to d/c pt on SAT. Weekend CSW will assist with d/c planning.  Werner Lean LCSW [phone]

## 2015-02-13 NOTE — Progress Notes (Signed)
Physical Therapy Treatment Patient Details Name: [name] MRN: [id number] DOB: 1966/05/02 Today's Date: 02/13/2015    History of Present Illness pt has a h/o metastatic lung CA who fell and sustained a subcapital fx of L hip; s/p L hemiarthroplasty    PT Comments    Pt progressing slowly with mobility.  Pt demonstrating sufficient strength for task completion but limited by pain.  Follow Up Recommendations  SNF     Equipment Recommendations  None recommended by PT    Recommendations for Other Services OT consult     Precautions / Restrictions Precautions Precautions: Fall Restrictions Weight Bearing Restrictions: No LLE Weight Bearing: Weight bearing as tolerated    Mobility  Bed Mobility Overal bed mobility: Needs Assistance Bed Mobility: Supine to Sit Rolling: Min guard Sidelying to sit: Min assist   Sit to supine: Mod assist;+2 for physical assistance   General bed mobility comments: cues to bend legs and use of rail to roll to R; min A for trunk to sit up  Transfers Overall transfer level: Needs assistance Equipment used: Rolling walker (2 wheeled) Transfers: Sit to/from Stand Sit to Stand: Min assist;+2 safety/equipment         General transfer comment: pt pulled up on walker, and walker was blocked by therapists  Ambulation/Gait Ambulation/Gait assistance: Min assist;+2 safety/equipment Ambulation Distance (Feet): 12 Feet Assistive device: Rolling walker (2 wheeled) Gait Pattern/deviations: Step-to pattern;Decreased step length - right;Decreased step length - left;Shuffle;Trunk flexed     General Gait Details: cues for posture, position from RW and sequence - ltd by pain/fatigue   Stairs            Wheelchair Mobility    Modified Rankin (Stroke Patients Only)       Balance                                    Cognition Arousal/Alertness: Awake/alert Behavior During Therapy: WFL for tasks  assessed/performed Overall Cognitive Status: Within Functional Limits for tasks assessed                      Exercises Total Joint Exercises Ankle Circles/Pumps: AROM;Both;15 reps;Supine Quad Sets: AROM;Both;10 reps;Supine Heel Slides: AAROM;Left;15 reps;Supine Hip ABduction/ADduction: AAROM;10 reps;Left;Supine    General Comments        Pertinent Vitals/Pain Pain Assessment: 0-10 Pain Score: 6  Pain Location: left side trunk to leg Pain Intervention(s): Limited activity within patient's tolerance;Monitored during session;Premedicated before session;Repositioned    Home Living                      Prior Function            PT Goals (current goals can now be found in the care plan section) Acute Rehab PT Goals Patient Stated Goal: get back to being independent and decreased pain PT Goal Formulation: With patient Time For Goal Achievement: 02/19/15 Potential to Achieve Goals: Fair Progress towards PT goals: Progressing toward goals    Frequency  7X/week    PT Plan Discharge plan needs to be updated    Co-evaluation PT/OT/SLP Co-Evaluation/Treatment: Yes Reason for Co-Treatment: For patient/therapist safety PT goals addressed during session: Mobility/safety with mobility OT goals addressed during session: ADL's and self-care     End of Session Equipment Utilized During Treatment: Gait belt Activity Tolerance: Patient limited by pain Patient left: in bed;with call bell/phone within reach  Time: 4799-8721 PT Time Calculation (min) (ACUTE ONLY): 35 min  Charges:  $Therapeutic Activity: 8-22 mins                    G Codes:      [name] 12-Mar-2015, 1:22 PM

## 2015-02-13 NOTE — Progress Notes (Signed)
Occupational Therapy Treatment Patient Details Name: Lyana Asbill MRN: [id number] DOB: May 24, 1966 Today's Date: 02/13/2015    History of present illness pt has a h/o metastatic lung CA who fell and sustained a subcapital fx of L hip; s/p L hemiarthroplasty   OT comments  Pt willing to participate and work within pain tolerance.  AE from bed level as that is where she is most comfortable; she did take some steps in the room prior to this  Follow Up Recommendations  SNF    Equipment Recommendations  None recommended by OT    Recommendations for Other Services      Precautions / Restrictions Precautions Precautions: Fall Restrictions Weight Bearing Restrictions: No LLE Weight Bearing: Weight bearing as tolerated       Mobility Bed Mobility     Rolling: Min guard Sidelying to sit: Min assist       General bed mobility comments: cues to bend legs and use of rail to roll to R; min A for trunk to sit up  Transfers Overall transfer level: Needs assistance Equipment used: Rolling walker (2 wheeled) Transfers: Sit to/from Stand Sit to Stand: Min assist;+2 safety/equipment         General transfer comment: pt pulled up on walker, and walker was blocked by therapists    Balance                                   ADL Overall ADL's : Needs assistance/impaired                                       General ADL Comments: overlapped session with PT for safety with mobility.  Pt was able to take 5 steps forward and backwards with min A, +2 safety, but she requested to return to supine for comfort.  Educated on transfer to 3:1, but she did not tolerate during this session.  Educated on AE and she practiced with this from bed level with min A. (sock aide on R foot  and reacher)      Vision                     Perception     Praxis      Cognition   Behavior During Therapy: WFL for tasks assessed/performed Overall Cognitive  Status: Within Functional Limits for tasks assessed                       Extremity/Trunk Assessment               Exercises     Shoulder Instructions       General Comments      Pertinent Vitals/ Pain       Pain Assessment: 0-10 Pain Score: 6  Pain Location: left side trunk to leg Pain Intervention(s): Limited activity within patient's tolerance;Monitored during session;Premedicated before session;Repositioned  Home Living                                          Prior Functioning/Environment              Frequency Min 2X/week     Progress Toward Goals  OT Goals(current goals can  now be found in the care plan section)  Progress towards OT goals: Progressing toward goals     Plan Discharge plan needs to be updated    Co-evaluation                 End of Session     Activity Tolerance Patient limited by pain   Patient Left in bed;with call bell/phone within reach   Nurse Communication          Time: 1208-1224 OT Time Calculation (min): 16 min  Charges: OT General Charges $OT Visit: 1 Procedure OT Treatments $Self Care/Home Management : 8-22 mins  Kaydyn Sayas 02/13/2015, 1:09 PM   Lesle Chris, OTR/L [phone] 02/13/2015

## 2015-02-13 NOTE — Progress Notes (Signed)
PROGRESS NOTE  [name] LFY:[id number] DOB: December 23, 1965 DOA: 02/09/2015 PCP: No primary care provider on file.  HPI: With metastatic squamous cell lung cancer, sustained a fall a few days ago, with left hip pain, she was seen at the cancer center for received immunotherapy, hip x ray showed left hip fracture, she was referred to the ER. She underwent repair on 8/23  Subjective / 24 H Interval events - she feels better, still with hip pain, she is afraid to place weight on it - agrees to SNF  Assessment/Plan: Active Problems:   Squamous cell lung cancer   Thrombosis of superior vena cava   Protein-calorie malnutrition, severe   Subcapital fracture of left hip   Closed left hip fracture  Subcapital fracture of Left hip - management per orthopedic surgery, she is s/p TOTAL HIP ARTHROPLASTY ANTERIOR APPROACH/ LEFT HIP BIPOLAR HEMI ARTHROPLASTY on 8/23 - SNF placement pending  Hyponatremia - stable to 130s in the past 2 days, however Na dipped to 127 this morning. 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 Will discontinue D5 1/2 NS, monitor. If Na stable tomorrow, d/c to SNF  Acute blood loss anemia - Hb with drop to 7.6 from 10 s/p transfusion 2U pRBC on 8/24 - this is stable  Chronic cancer pain - continue fentanyl150mgpatchq3d, mscontin '45mg'$  q8hr,MSIR '30mg'$  q4hr prn.  - Continue miralax/senokot-s.  SVC thrombosis (diganosed 08/2014)  - continue Lovenox  Metastatic squamous cell lung cancer:  - outpatient oncology follow up, currently on immunotherapy with Nivolumab q2wk ,last dose 8/22.   Diet: Diet regular Room service appropriate?: Yes; Fluid consistency:: Thin Fluids: none DVT Prophylaxis: Lovenox  Code Status: Full Code Family Communication: no family bedside  Disposition Plan: remain inpatient  Consultants:  Orthopedic surgery   Procedures:  THA left 8/23   Antibiotics  Anti-infectives    Start     Dose/Rate Route Frequency  Ordered Stop   02/10/15 2200  ceFAZolin (ANCEF) IVPB 2 g/50 mL premix     2 g 100 mL/hr over 30 Minutes Intravenous Every 6 hours 02/10/15 2052 02/11/15 0500   02/10/15 1645  ceFAZolin (ANCEF) IVPB 2 g/50 mL premix     2 g 100 mL/hr over 30 Minutes Intravenous  Once 02/10/15 1639 02/10/15 1646      Studies  No results found. Objective  Filed Vitals:   02/12/15 0501 02/12/15 1359 02/12/15 2200 02/13/15 0701  BP: 124/78 131/76 134/83 138/74  Pulse: 88 82 85 78  Temp: 98.2 F (36.8 C) 98.3 F (36.8 C) 98.5 F (36.9 C) 98.1 F (36.7 C)  TempSrc: Oral Oral Oral Oral  Resp: '16 16 15 16  '$ Height:      Weight:      SpO2: 100% 95% 95% 95%    Intake/Output Summary (Last 24 hours) at 02/13/15 1139 Last data filed at 02/13/15 1000  Gross per 24 hour  Intake 1410.83 ml  Output   1275 ml  Net 135.83 ml   Filed Weights   02/09/15 1700  Weight: 51.9 kg (114 lb 6.7 oz)   Exam:  GENERAL: NAD  HEENT: head NCAT, no scleral icterus.   NECK: Supple. No carotid bruits.  LUNGS: Clear to auscultation. No wheezing or crackles  HEART: Regular rate and rhythm without murmur. 2+ pulses, no JVD, no peripheral edema  Data Reviewed: Basic Metabolic Panel:  Recent Labs Lab 02/09/15 1613 02/10/15 0325 02/11/15 0443 02/12/15 0456 02/13/15 0549  NA 131* 130* 130* 130* 127*  K 3.7 3.5 3.7 4.6 3.5  CL 95* 98* 101 98* 96*  CO2 '25 24 27 26 27  '$ GLUCOSE 101* 115* 126* 94 98  BUN '7 7 6 '$ <5* 6  CREATININE 0.53 0.61 0.47 0.51 0.37*  CALCIUM 9.2 8.6* 8.1* 8.9 8.5*  MG  --   --  1.5*  --   --    Liver Function Tests:  Recent Labs Lab 02/09/15 1057 02/10/15 0325  AST 7 9*  ALT <6 <5*  ALKPHOS 150 137*  BILITOT 0.41 0.5  PROT 5.5* 5.6*  ALBUMIN 2.2* 2.4*   CBC:  Recent Labs Lab 02/09/15 1056 02/09/15 1613 02/10/15 0325 02/11/15 0443 02/12/15 0456 02/13/15 0549  WBC 9.5 10.6* 9.2 12.7* 10.8* 9.9  NEUTROABS 8.2* 9.2*  --   --   --   --   HGB 10.0* 10.7* 10.0* 7.6* 10.5*  10.0*  HCT 30.3* 33.6* 31.9* 24.5* 32.1* 30.9*  MCV 74.6* 77.2* 77.8* 77.8* 77.3* 76.3*  PLT 702* 564* 508* 408* 401* 414*   BNP (last 3 results)  Recent Labs  09/22/14 0405  BNP 301.5*    Recent Results (from the past 240 hour(s))  Surgical pcr screen     Status: None   Collection Time: 02/10/15  3:52 PM  Result Value Ref Range Status   MRSA, PCR NEGATIVE NEGATIVE Final   Staphylococcus aureus NEGATIVE NEGATIVE Final    Comment:        The Xpert SA Assay (FDA approved for NASAL specimens in patients over 17 years of age), is one component of a comprehensive surveillance program.  Test performance has been validated by [facility] for patients greater than or equal to 63 year old. It is not intended to diagnose infection nor to guide or monitor treatment.      Scheduled Meds: . antiseptic oral rinse  7 mL Mouth Rinse q12n4p  . chlorhexidine  15 mL Mouth Rinse BID  . DULoxetine  30 mg Oral Daily  . enoxaparin (LOVENOX) injection  80 mg Subcutaneous Q24H  . fentaNYL  100 mcg Transdermal Q72H  . morphine  15 mg Oral TID  . morphine  30 mg Oral 3 times per day  . polyethylene glycol  17 g Oral Daily  . senna-docusate  1 tablet Oral BID   Continuous Infusions:    Marzetta Board, MD [facility] Pager [phone]. If 7 PM - 7 AM, please contact night-coverage at [email or web address], password [facility] 02/13/2015, 11:39 AM  LOS: 4 days

## 2015-02-14 DIAGNOSIS — E871 Hypo-osmolality and hyponatremia: Secondary | ICD-10-CM | POA: Diagnosis present

## 2015-02-14 DIAGNOSIS — E43 Unspecified severe protein-calorie malnutrition: Secondary | ICD-10-CM

## 2015-02-14 LAB — BASIC METABOLIC PANEL
Anion gap: 7 (ref 5–15)
BUN: 6 mg/dL (ref 6–20)
CALCIUM: 8.6 mg/dL — AB (ref 8.9–10.3)
CHLORIDE: 96 mmol/L — AB (ref 101–111)
CO2: 25 mmol/L (ref 22–32)
CREATININE: 0.36 mg/dL — AB (ref 0.44–1.00)
GFR calc non Af Amer: >60 mL/min (ref >60)
GLUCOSE: 76 mg/dL (ref 65–99)
Potassium: 3.6 mmol/L (ref 3.5–5.1)
Sodium: 128 mmol/L — ABNORMAL LOW (ref 135–145)

## 2015-02-14 MED ORDER — FENTANYL 100 MCG/HR TD PT72: 100.0000 ug | MEDICATED_PATCH | TRANSDERMAL | Status: DC

## 2015-02-14 MED ORDER — MORPHINE SULFATE 15 MG PO TABS: 30.0000 mg | ORAL_TABLET | ORAL | Status: DC | PRN

## 2015-02-14 MED ORDER — MORPHINE SULFATE ER 15 MG PO TBCR: 15.0000 mg | EXTENDED_RELEASE_TABLET | Freq: Three times a day (TID) | ORAL | Status: DC

## 2015-02-14 NOTE — Progress Notes (Signed)
Report called to [facility], taken by [place]. Jeramyah Goodpasture, [employer]

## 2015-02-14 NOTE — Progress Notes (Signed)
ANTICOAGULATION CONSULT NOTE   Pharmacy Consult for Lovenox Indication: hx SVC thrombus  Allergies  Allergen Reactions  . Penicillins Rash    Patient Measurements: Height: '5\' 7"'$  (170.2 cm) Weight: 114 lb 6.7 oz (51.9 kg) (weight from 01/26/15- too weak to stand today) IBW/kg (Calculated) : 61.6  Vital Signs: Temp: 98.5 F (36.9 C) (08/27 0620) Temp Source: Oral (08/27 0620) BP: 121/74 mmHg (08/27 0620) Pulse Rate: 72 (08/27 0620)  Labs:  Recent Labs  02/12/15 0456 02/13/15 0549 02/14/15 0456  HGB 10.5* 10.0*  --   HCT 32.1* 30.9*  --   PLT 401* 414*  --   CREATININE 0.51 0.37* 0.36*    Estimated Creatinine Clearance: 71.2 mL/min (by C-G formula based on Cr of 0.36).   Medical History: Past Medical History  Diagnosis Date  . Cancer     left bronchioles  . Radiation 11/24/14-12/05/14    Left ribs 30 Gy in 10 fractions    Assessment: 49 yo F who is receiving treatment for metastatic lung CA presented at [facility] 8/22 for Opdivo infusion and complaint of left hip pain from a fall a few days ago.  Xray + left hip fracture.  She has been on Lovenox '80mg'$  (1.'5mg'$ /kg) q24h since March 2016 for SVC thrombosis.  Last injection was 8/21 @ 7pm.   Patient underwent L hip hemiarthroplasty 8/23 for which patient was bridged with heparin infusion perioperatively. Heparin infusion d/c'ed at 1300 8/23 prior to procedure. Patient now being resumed on lovenox therapy.  Today, 02/14/2015  SCr 0.36 with CrCl ~ 71 ml/min CG  CBC: Hgb improved to 10 gm/dl, Pltc elevated 414K  Patient received 2 units PRBCs 8/26  No bleeding issues noted per nursing/PA   Goal of Therapy:  Anti-Xa level 0.6-1 units/ml 4hrs after LMWH dose given Monitor platelets by anticoagulation protocol: Yes   Plan:   Continue patient's home dose of Lovenox 80 mg (1.5 mg/kg) SQ daily.  D/C ASA per d/w Dr. Ninfa Linden.  Monitor renal function, CBC, and for s/s of bleeding.  Plan home today  Minda Ditto  PharmD Pager [phone] 02/14/2015, 8:02 AM

## 2015-02-14 NOTE — Plan of Care (Signed)
Problem: Discharge Progression Outcomes Goal: Discharge plan in place and appropriate Outcome: Completed/Met Date Met:  02/14/15 SNF

## 2015-02-14 NOTE — Discharge Summary (Signed)
Physician Discharge Summary  [name] YPP:[id number] DOB: May 27, 1966 DOA: 02/09/2015  PCP: No primary care provider on file.  Admit date: 02/09/2015 Discharge date: 02/14/2015  Time spent: > 35 minutes  Recommendations for Outpatient Follow-up:  1. Follow up with Dr. Julien Nordmann as scheduled  2. Follow up with Dr. Ninfa Linden in 2 weeks   Discharge Diagnoses:  Active Problems:   Squamous cell lung cancer   Thrombosis of superior vena cava   Protein-calorie malnutrition, severe   Subcapital fracture of left hip   Closed left hip fracture   Hyponatremia  Discharge Condition: stable  Diet recommendation: regular  Filed Weights   02/09/15 1700  Weight: 51.9 kg (114 lb 6.7 oz)    History of present illness:  [name] is a 49 y.o. female With metastatic squamous cell lung cancer, sustained a fall a few days ago, with left hip pain, today she is seen at the cancer therapy to received immunotherapy, hip x ray showed left hip fracture, she is referred to the ER. ED course: vital stable, lab mild leukocytosis, mild hyponatremia, otherwise unremarkable. orthopedic surgery Dr. Ninfa Linden consulted and advised to get CT left hip, admit to hospitalist service, anticipate surgery tomorrow. Patient otherwise stable at baseline, had chronic nonproductive cough, no fever, no dysuria, no diarrhea, she has chronic cancer pain which is controlled.  Hospital Course:  Subcapital fracture of Left hip - management per orthopedic surgery, she is s/p TOTAL HIP ARTHROPLASTY ANTERIOR APPROACH/ LEFT HIP BIPOLAR HEMI ARTHROPLASTY on 8/23. She recovered well postoperatively, continue rehab at SNF, to see Dr. Ninfa Linden in office in 2 weeks.  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 Repeat BMP in 3-4 days at SNF Acute blood loss  anemia - Hb with drop to 7.6 from 10 s/p transfusion 2U pRBC on 8/24, hemoglobin has remained stable.  Chronic cancer pain - continue home regimen SVC thrombosis (diganosed 08/2014) - continue Lovenox Metastatic squamous cell lung cancer: - outpatient oncology follow up, currently on immunotherapy with Nivolumab q2wk ,last dose 8/22.  Procedures:  THA left 8/23   Consultations:  Orthopedic surgery   Discharge Exam: Filed Vitals:   02/13/15 0701 02/13/15 1651 02/13/15 2120 02/14/15 0620  BP: 138/74 132/79 122/81 121/74  Pulse: 78 75 72 72  Temp: 98.1 F (36.7 C) 98.4 F (36.9 C) 98.4 F (36.9 C) 98.5 F (36.9 C)  TempSrc: Oral Oral Oral Oral  Resp: '16 16 15 15  '$ Height:      Weight:      SpO2: 95% 97% 96% 92%    General: NAD Cardiovascular: RRR Respiratory: CTA biL  Discharge Instructions  Discharge Instructions    Weight bearing as tolerated    Complete by:  As directed   Laterality:  left  Extremity:  Lower            Medication List    TAKE these medications        acetaminophen 325 MG tablet  Commonly known as:  TYLENOL  Take 2 tablets (650 mg total) by mouth every 6 (six) hours as needed for mild pain (or Fever >/= 101).     diclofenac 50 MG EC tablet  Commonly known as:  VOLTAREN  TK 1 T PO BID     DULoxetine 30 MG capsule  Commonly known as:  CYMBALTA  Take 30 mg by mouth daily.     enoxaparin 80 MG/0.8ML injection  Commonly known as:  LOVENOX  ADMINISTER 0.8 ML UNDER THE SKIN DAILY     fentaNYL 100 MCG/HR  Commonly known as:  DURAGESIC - dosed mcg/hr  Place 1 patch (100 mcg total) onto the skin every 3 (three) days.     ferrous sulfate 325 (65 FE) MG EC tablet  Take 1 tablet (325 mg total) by mouth 2 (two) times daily with a meal.     ipratropium 0.02 % nebulizer solution  Commonly known as:  ATROVENT  Take 2.5 mLs (0.5 mg total) by nebulization 3 (three) times daily.     LORazepam 1 MG tablet  Commonly known as:  ATIVAN  Take 1  tablet (1 mg total) by mouth every 6 (six) hours as needed for anxiety.     morphine 30 MG 12 hr tablet  Commonly known as:  MS CONTIN  Take 1 tablet (30 mg total) by mouth every 8 (eight) hours.     morphine 15 MG 12 hr tablet  Commonly known as:  MS CONTIN  Take 1 tablet (15 mg total) by mouth 3 (three) times daily.     morphine 15 MG tablet  Commonly known as:  MSIR  Take 2 tablets (30 mg total) by mouth every 4 (four) hours as needed for severe pain.     omeprazole 20 MG capsule  Commonly known as:  PRILOSEC  Take 1 capsule (20 mg total) by mouth 2 (two) times daily before a meal.     ondansetron 8 MG tablet  Commonly known as:  ZOFRAN  TAKE 1 TABLET(8 MG) BY MOUTH EVERY 8 HOURS AS NEEDED FOR NAUSEA OR VOMITING     polyethylene glycol packet  Commonly known as:  MIRALAX / GLYCOLAX  Take 17 g by mouth 3 (three) times daily. May decrease to twice daily if you have frequent bowel movements.     senna-docusate 8.6-50 MG per tablet  Commonly known as:  Senokot-S  Take 1 tablet by mouth at bedtime.           Follow-up Information    Follow up with Mcarthur Rossetti, MD. Schedule an appointment as soon as possible for a visit in 2 weeks.   Specialty:  Orthopedic Surgery   Contact information:   [place] [address] [phone]       Follow up with Mcarthur Rossetti, MD. Schedule an appointment as soon as possible for a visit in 2 weeks.   Specialty:  Orthopedic Surgery   Contact information:   [place] [address] [phone]       Follow up with Eilleen Kempf., MD. Schedule an appointment as soon as possible for a visit in 1 week.   Specialty:  Oncology   Contact information:   [address] [place] [address] [phone]       The results of significant diagnostics from this hospitalization (including imaging, microbiology, ancillary and laboratory) are listed below for reference.     Significant Diagnostic Studies: Dg Ribs Unilateral W/chest Left  02/09/2015   CLINICAL DATA:  Fall last Wednesday left mid axillary rib pain.  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 No confluent opacities on the right. No visible rib fracture. Heart  is normal size.  IMPRESSION: Continued complete opacification of the left hemithorax as seen on prior chest CT.   Electronically Signed   By: Rolm Baptise M.D.   On: 02/09/2015 14:55   Pelvis Portable  02/10/2015   CLINICAL DATA:  Patient status post left hip hemiarthroplasty.  EXAM: PORTABLE PELVIS 1-2 VIEWS  COMPARISON:  CT pelvis 02/09/2015  FINDINGS: Patient status post left hip hemiarthroplasty. AP portable pelvic images demonstrate hardware to be in appropriate position. Overlying surgical staple line. Soft tissue gas compatible with postoperative state.  IMPRESSION: Patient status post left hip hemiarthroplasty.   Electronically Signed   By: Lovey Newcomer M.D.   On: 02/10/2015 18:47   Ct Hip Left Wo Contrast  02/09/2015   CLINICAL DATA:  Status post fall 6 days ago.  Left hip pain.  EXAM: CT OF THE LEFT HIP WITHOUT CONTRAST  TECHNIQUE: Multidetector CT imaging of the left hip was performed according to the standard protocol. Multiplanar CT image reconstructions were also generated.  COMPARISON:  None.  FINDINGS: There is a comminuted, impacted and mildly displaced left subcapital fracture. There is no other fracture or dislocation. There is no lytic or sclerotic osseous lesion. The superior and inferior pubic rami are intact. The muscles are normal. There is no fluid collection or hematoma.  There is no pelvic free fluid.  Visualized bladder is normal.  IMPRESSION: Comminuted, impacted and mildly displaced left femoral subcapital fracture.   Electronically Signed   By: Kathreen Devoid   On:  02/09/2015 17:04   Dg C-arm 1-60 Min-no Report  02/10/2015   CLINICAL DATA: surgery   C-ARM 1-60 MINUTES  Fluoroscopy was utilized by the requesting physician.  No radiographic  interpretation.    Dg Hip Operative Unilat With Pelvis Left  02/10/2015   CLINICAL DATA:  Patient with left hip fracture.  EXAM: OPERATIVE LEFT HIP (WITH PELVIS IF PERFORMED) 2 VIEWS  TECHNIQUE: Fluoroscopic spot image(s) were submitted for interpretation post-operatively.  COMPARISON:  Head CT 10/22/2014; left hip CT 02/09/2015  FINDINGS: Patient status post left hip arthroplasty. Hardware appears in appropriate position.  IMPRESSION: Patient status post left hip arthroplasty.   Electronically Signed   By: Lovey Newcomer M.D.   On: 02/10/2015 18:01   Dg Hip Unilat W Or W/o Pelvis 2-3 Views Left  02/09/2015   CLINICAL DATA:  Fall, left hip pain  EXAM: DG HIP (WITH OR WITHOUT PELVIS) 2-3V LEFT  COMPARISON:  None.  FINDINGS: Subcapital left hip fracture with foreshortening and varus angulation.  Bilateral hip joint spaces are symmetric.  Visualized bony pelvis appears intact.  IMPRESSION: Subcapital left hip fracture, as above.  These results will be called to the ordering clinician or representative by the Radiology Department at the imaging location.   Electronically Signed   By: Julian Hy M.D.   On: 02/09/2015 14:56    Microbiology: Recent Results (from the past 240 hour(s))  Surgical pcr screen     Status: None   Collection Time: 02/10/15  3:52 PM  Result Value Ref Range Status   MRSA, PCR NEGATIVE NEGATIVE Final   Staphylococcus aureus NEGATIVE NEGATIVE Final    Comment:        The Xpert SA Assay (FDA approved for NASAL specimens in patients over 45 years of age), is one component of a comprehensive surveillance program.  Test performance has been validated by [facility] for patients greater than or equal to 50 year old. It is not intended to diagnose infection  nor to guide or monitor treatment.       Labs: Basic Metabolic Panel:  Recent Labs Lab 02/09/15 1057 02/09/15 1613 02/10/15 0325 02/11/15 0443 02/12/15 0456 02/13/15 0549 02/14/15 0456  NA 131* 131* 130* 130* 130* 127* 128*  K 3.7 3.7 3.5 3.7 4.6 3.5 3.6  CL  --  95* 98* 101 98* 96* 96*  CO2 '23 25 24 27 26 27 25  '$ GLUCOSE 117 101* 115* 126* 94 98 76  BUN 5.3* '7 7 6 '$ <5* 6 6  CREATININE 0.6 0.53 0.61 0.47 0.51 0.37* 0.36*  CALCIUM 9.2 9.2 8.6* 8.1* 8.9 8.5* 8.6*  MG  --   --   --  1.5*  --   --   --    Liver Function Tests:  Recent Labs Lab 02/09/15 1057 02/10/15 0325  AST 7 9*  ALT <6 <5*  ALKPHOS 150 137*  BILITOT 0.41 0.5  PROT 5.5* 5.6*  ALBUMIN 2.2* 2.4*   CBC:  Recent Labs Lab 02/09/15 1056 02/09/15 1613 02/10/15 0325 02/11/15 0443 02/12/15 0456 02/13/15 0549  WBC 9.5 10.6* 9.2 12.7* 10.8* 9.9  NEUTROABS 8.2* 9.2*  --   --   --   --   HGB 10.0* 10.7* 10.0* 7.6* 10.5* 10.0*  HCT 30.3* 33.6* 31.9* 24.5* 32.1* 30.9*  MCV 74.6* 77.2* 77.8* 77.8* 77.3* 76.3*  PLT 702* 564* 508* 408* 401* 414*   BNP: BNP (last 3 results)  Recent Labs  09/22/14 0405  BNP 301.5*    Signed:  Aliyah Abeyta  [facility] 02/14/2015, 7:45 AM

## 2015-02-14 NOTE — Progress Notes (Signed)
Patient currently being transferred to [facility]

## 2015-02-14 NOTE — Progress Notes (Signed)
Discharged from floor via stretcher, belongings with pt, accomp by [facility] EMT. No changes in assessment. Weltha Cathy, [employer]

## 2015-02-14 NOTE — Clinical Social Work Placement (Signed)
   CLINICAL SOCIAL WORK PLACEMENT  NOTE  Date:  02/14/2015  Patient Details  Name: Kevina Piloto MRN: [id number] Date of Birth: 08/20/1965  Clinical Social Work is seeking post-discharge placement for this patient at the   level of care (*CSW will initial, date and re-position this form in  chart as items are completed):      Patient/family provided with [facility] Work Department's list of facilities offering this level of care within the geographic area requested by the patient (or if unable, by the patient's family).      Patient/family informed of their freedom to choose among providers that offer the needed level of care, that participate in Medicare, Medicaid or managed care program needed by the patient, have an available bed and are willing to accept the patient.      Patient/family informed of [facility]'s ownership interest in [facility] and [facility], as well as of the fact that they are under no obligation to receive care at these facilities.  PASRR submitted to EDS on       PASRR number received on       Existing PASRR number confirmed on       FL2 transmitted to all facilities in geographic area requested by pt/family on       FL2 transmitted to all facilities within larger geographic area on       Patient informed that his/her managed care company has contracts with or will negotiate with certain facilities, including the following:            Patient/family informed of bed offers received.  Patient chooses bed at   [facility]    Physician recommends and patient chooses bed at      Patient to be transferred to  [facility] on  .February 14, 2015  Patient to be transferred to facility by  ambulance     Patient family notified on   February 14, 2015 of transfer.  Name of family member notified:     Richard/brother   PHYSICIAN       Additional Comment:    _______________________________________________ Carlean Jews,  LCSW 02/14/2015, 4:17 PM

## 2015-02-16 ENCOUNTER — Non-Acute Institutional Stay: Payer: 59 | Admitting: Adult Health

## 2015-02-16 DIAGNOSIS — C3490 Malignant neoplasm of unspecified part of unspecified bronchus or lung: Secondary | ICD-10-CM

## 2015-02-16 DIAGNOSIS — F419 Anxiety disorder, unspecified: Secondary | ICD-10-CM

## 2015-02-16 DIAGNOSIS — S72002S Fracture of unspecified part of neck of left femur, sequela: Secondary | ICD-10-CM | POA: Diagnosis not present

## 2015-02-16 DIAGNOSIS — F32A Depression, unspecified: Secondary | ICD-10-CM

## 2015-02-16 DIAGNOSIS — I82290 Acute embolism and thrombosis of other thoracic veins: Secondary | ICD-10-CM

## 2015-02-16 DIAGNOSIS — F329 Major depressive disorder, single episode, unspecified: Secondary | ICD-10-CM | POA: Diagnosis not present

## 2015-02-16 DIAGNOSIS — K219 Gastro-esophageal reflux disease without esophagitis: Secondary | ICD-10-CM | POA: Diagnosis not present

## 2015-02-16 DIAGNOSIS — E871 Hypo-osmolality and hyponatremia: Secondary | ICD-10-CM | POA: Diagnosis not present

## 2015-02-16 DIAGNOSIS — K5903 Drug induced constipation: Secondary | ICD-10-CM

## 2015-02-16 DIAGNOSIS — K5909 Other constipation: Secondary | ICD-10-CM

## 2015-02-16 DIAGNOSIS — T402X5A Adverse effect of other opioids, initial encounter: Secondary | ICD-10-CM

## 2015-02-16 DIAGNOSIS — D62 Acute posthemorrhagic anemia: Secondary | ICD-10-CM | POA: Diagnosis not present

## 2015-02-16 DIAGNOSIS — I82210 Acute embolism and thrombosis of superior vena cava: Secondary | ICD-10-CM

## 2015-02-18 ENCOUNTER — Encounter: Payer: Self-pay | Admitting: Adult Health

## 2015-02-18 NOTE — Progress Notes (Signed)
Patient ID: [name], female   DOB: 10/19/1965, 49 y.o.   MRN: [id number]    DATE:  02/16/15 MRN:  [id number]  BIRTHDAY: [birth year]  Facility:  Nursing Home Location:  [place] Room Number: 907-P  LEVEL OF CARE:  SNF (31)  Contact Information    Name Relation Home Work [place] Brother [phone]  [phone]       Chief Complaint  Patient presents with  . Hospitalization Follow-up    Left hip fracture S/P left hip hemiarthroplasty; hyponatremia, anemia, SVC thrombosis, metastatic squamous cell lung cancer, depression, constipation, anxiety and GERD    HISTORY OF PRESENT ILLNESS:  This is a 49 year old female who has been admitted to [facility] on 02/14/15 from [facility]. She has metastatic squamous cell lung cancer and currently undergoing treatments. She had a fall and started to have left hip. She was being seen at the cancer center for therapy. X-ray showed left hip fracture. She was brought to ER. She had left total hip arthroplasty anterior approach/left hip bipolar hemiarthroplasty on 02/10/15.  She was seen today and noted to be in tears due to left hip pain. Noted left hip to be very tender even with slight movement.  He has been admitted for a short-term rehabilitation.   PAST MEDICAL HISTORY:  Past Medical History  Diagnosis Date  . Cancer     left bronchioles  . Radiation 11/24/14-12/05/14    Left ribs 30 Gy in 10 fractions     CURRENT MEDICATIONS: Reviewed  Patient's Medications  New Prescriptions   No medications on file  Previous Medications   ACETAMINOPHEN (TYLENOL) 325 MG TABLET    Take 2 tablets (650 mg total) by mouth every 6 (six) hours as needed for mild pain (or Fever >/= 101).   DICLOFENAC (VOLTAREN) 50 MG EC TABLET    TK 1 T PO BID   DULOXETINE (CYMBALTA) 30 MG CAPSULE    Take 30 mg by mouth daily.    ENOXAPARIN (LOVENOX) 80 MG/0.8ML INJECTION    ADMINISTER 0.8 ML UNDER  THE SKIN DAILY   FENTANYL (DURAGESIC - DOSED MCG/HR) 100 MCG/HR    Place 1 patch (100 mcg total) onto the skin every 3 (three) days.   FERROUS SULFATE 325 (65 FE) MG EC TABLET    Take 1 tablet (325 mg total) by mouth 2 (two) times daily with a meal.   IPRATROPIUM (ATROVENT) 0.02 % NEBULIZER SOLUTION    Take 2.5 mLs (0.5 mg total) by nebulization 3 (three) times daily.   LORAZEPAM (ATIVAN) 1 MG TABLET    Take 1 tablet (1 mg total) by mouth every 6 (six) hours as needed for anxiety.   METHOCARBAMOL (ROBAXIN) 500 MG TABLET    Take 500 mg by mouth 3 (three) times daily.   MORPHINE (MS CONTIN) 15 MG 12 HR TABLET    Take 1 tablet (15 mg total) by mouth 3 (three) times daily.   MORPHINE (MS CONTIN) 30 MG 12 HR TABLET    Take 1 tablet (30 mg total) by mouth every 8 (eight) hours.   MORPHINE (MSIR) 15 MG TABLET    Take 2 tablets (30 mg total) by mouth every 4 (four) hours as needed for severe pain.   OMEPRAZOLE (PRILOSEC) 20 MG CAPSULE    Take 1 capsule (20 mg total) by mouth 2 (two) times daily before a meal.   ONDANSETRON (ZOFRAN) 8 MG TABLET    TAKE  1 TABLET(8 MG) BY MOUTH EVERY 8 HOURS AS NEEDED FOR NAUSEA OR VOMITING   POLYETHYLENE GLYCOL (MIRALAX / GLYCOLAX) PACKET    Take 17 g by mouth 3 (three) times daily. May decrease to twice daily if you have frequent bowel movements.   SENNA-DOCUSATE (SENOKOT-S) 8.6-50 MG PER TABLET    Take 1 tablet by mouth at bedtime.  Modified Medications   No medications on file  Discontinued Medications   No medications on file     Allergies  Allergen Reactions  . Penicillins Rash     REVIEW OF SYSTEMS:  GENERAL: no change in appetite, no fatigue, no weight changes, no fever, chills or weakness EYES: Denies change in vision, dry eyes, eye pain, itching or discharge EARS: Denies change in hearing, ringing in ears, or earache NOSE: Denies nasal congestion or epistaxis MOUTH and THROAT: Denies oral discomfort, gingival pain or bleeding, pain from teeth or  hoarseness   RESPIRATORY: no cough, SOB, DOE, wheezing, hemoptysis CARDIAC: no chest pain, edema or palpitations GI: no abdominal pain, diarrhea, constipation, heart burn, nausea or vomiting GU: Denies dysuria, frequency, hematuria, incontinence, or discharge PSYCHIATRIC: Denies feeling of depression or anxiety. No report of hallucinations, insomnia, paranoia, or agitation   PHYSICAL EXAMINATION  GENERAL APPEARANCE:  Normal body habitus SKIN:  Left hip surgical site is covered with aquacel dressing HEAD: Normal in size and contour. No evidence of trauma EYES: Lids open and close normally. No blepharitis, entropion or ectropion. PERRL. Conjunctivae are clear and sclerae are white. Lenses are without opacity EARS: Pinnae are normal. Patient hears normal voice tunes of the examiner MOUTH and THROAT: Lips are without lesions. Oral mucosa is moist and without lesions. Tongue is normal in shape, size, and color and without lesions NECK: supple, trachea midline, no neck masses, no thyroid tenderness, no thyromegaly LYMPHATICS: no LAN in the neck, no supraclavicular LAN RESPIRATORY: breathing is even & unlabored, BS CTAB CARDIAC: RRR, no murmur,no extra heart sounds, no edema GI: abdomen soft, normal BS, no masses, no tenderness, no hepatomegaly, no splenomegaly EXTREMITIES:  Able to move 4 extremities with some limitations on LLE due to pain PSYCHIATRIC: Alert and oriented X 3. Affect and behavior are appropriate  LABS/RADIOLOGY: Labs reviewed: Basic Metabolic Panel:  Recent Labs  09/16/14 0100  09/19/14 0500  02/11/15 0443 02/12/15 0456 02/13/15 0549 02/14/15 0456  NA 131*  < > 132*  < > 130* 130* 127* 128*  K 2.9*  < > 3.7  < > 3.7 4.6 3.5 3.6  CL 93*  < > 94*  < > 101 98* 96* 96*  CO2 31  < > 32  < > '27 26 27 25  '$ GLUCOSE 93  < > 138*  < > 126* 94 98 76  BUN 8  < > 6  < > 6 <5* 6 6  CREATININE 0.43*  < > 0.40*  < > 0.47 0.51 0.37* 0.36*  CALCIUM 7.9*  < > 8.2*  < > 8.1* 8.9  8.5* 8.6*  MG 1.4*  --  1.6  --  1.5*  --   --   --   PHOS  --   --  2.3  --   --   --   --   --   < > = values in this interval not displayed. Liver Function Tests:  Recent Labs  01/26/15 1302 02/09/15 1057 02/10/15 0325  AST <7 7 9*  ALT <6 <6 <5*  ALKPHOS 158* 150 137*  BILITOT 0.24 0.41 0.5  PROT 5.9* 5.5* 5.6*  ALBUMIN 2.2* 2.2* 2.4*    Recent Labs  09/10/14 1915  LIPASE 13   CBC:  Recent Labs  01/26/15 1302 02/09/15 1056 02/09/15 1613  02/11/15 0443 02/12/15 0456 02/13/15 0549  WBC 9.5 9.5 10.6*  < > 12.7* 10.8* 9.9  NEUTROABS 8.2* 8.2* 9.2*  --   --   --   --   HGB 10.7* 10.0* 10.7*  < > 7.6* 10.5* 10.0*  HCT 33.3* 30.3* 33.6*  < > 24.5* 32.1* 30.9*  MCV 76.6* 74.6* 77.2*  < > 77.8* 77.3* 76.3*  PLT 835* 702* 564*  < > 408* 401* 414*  < > = values in this interval not displayed.  Cardiac Enzymes:  Recent Labs  09/13/14 1712 09/13/14 2259 09/14/14 0330  TROPONINI <0.03 <0.03 <0.03   CBG:  Recent Labs  10/22/14 1419  GLUCAP 85     Dg Ribs Unilateral W/chest Left  02/09/2015   CLINICAL DATA:  Fall last Wednesday left mid axillary rib pain.  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 No confluent opacities on the right. No visible rib fracture. Heart is normal size.  IMPRESSION: Continued complete opacification of the left hemithorax as seen on prior chest CT.   Electronically Signed   By: Rolm Baptise M.D.   On: 02/09/2015 14:55   Pelvis Portable  02/10/2015   CLINICAL DATA:  Patient status post left hip hemiarthroplasty.  EXAM: PORTABLE PELVIS 1-2 VIEWS  COMPARISON:  CT pelvis 02/09/2015  FINDINGS: Patient status post left hip hemiarthroplasty. AP portable pelvic images demonstrate hardware to be in appropriate position. Overlying surgical staple line. Soft tissue gas compatible with  postoperative state.  IMPRESSION: Patient status post left hip hemiarthroplasty.   Electronically Signed   By: Lovey Newcomer M.D.   On: 02/10/2015 18:47   Ct Hip Left Wo Contrast  02/09/2015   CLINICAL DATA:  Status post fall 6 days ago.  Left hip pain.  EXAM: CT OF THE LEFT HIP WITHOUT CONTRAST  TECHNIQUE: Multidetector CT imaging of the left hip was performed according to the standard protocol. Multiplanar CT image reconstructions were also generated.  COMPARISON:  None.  FINDINGS: There is a comminuted, impacted and mildly displaced left subcapital fracture. There is no other fracture or dislocation. There is no lytic or sclerotic osseous lesion. The superior and inferior pubic rami are intact. The muscles are normal. There is no fluid collection or hematoma.  There is no pelvic free fluid.  Visualized bladder is normal.  IMPRESSION: Comminuted, impacted and mildly displaced left femoral subcapital fracture.   Electronically Signed   By: Kathreen Devoid   On: 02/09/2015 17:04   Dg C-arm 1-60 Min-no Report  02/10/2015   CLINICAL DATA: surgery   C-ARM 1-60 MINUTES  Fluoroscopy was utilized by the requesting physician.  No radiographic  interpretation.    Dg Hip Operative Unilat With Pelvis Left  02/10/2015   CLINICAL DATA:  Patient with left hip fracture.  EXAM: OPERATIVE LEFT HIP (WITH PELVIS IF PERFORMED) 2 VIEWS  TECHNIQUE: Fluoroscopic spot image(s) were submitted for interpretation post-operatively.  COMPARISON:  Head CT 10/22/2014; left hip CT 02/09/2015  FINDINGS: Patient status post left hip arthroplasty. Hardware appears in appropriate position.  IMPRESSION: Patient status post left hip arthroplasty.   Electronically Signed  By: Lovey Newcomer M.D.   On: 02/10/2015 18:01   Dg Hip Unilat W Or W/o Pelvis 2-3 Views Left  02/09/2015   CLINICAL DATA:  Fall, left hip pain  EXAM: DG HIP (WITH OR WITHOUT PELVIS) 2-3V LEFT  COMPARISON:  None.  FINDINGS: Subcapital left hip fracture with foreshortening and  varus angulation.  Bilateral hip joint spaces are symmetric.  Visualized bony pelvis appears intact.  IMPRESSION: Subcapital left hip fracture, as above.  These results will be called to the ordering clinician or representative by the Radiology Department at the imaging location.   Electronically Signed   By: Julian Hy M.D.   On: 02/09/2015 14:56    ASSESSMENT/PLAN:  Left hip fracture S/P left total hip arthroplasty anterior approach/left hip bipolar hemiarthroplasty - for rehabilitation; continue fentanyl 100 g/hour 1 patch on to skin every 3 days, morphine 30 mg 12 hour 1 tab by mouth every 8 hours, morphine 15 mg 12 hour 1 tab by mouth 3 times a day and morphine sulfate 15 mg IR 2 tabs by mouth every 4 hours when necessary for pain; physiatry consult and treat for pain management; start Robaxin 500 mg 1 tab by mouth 3 times a day for muscle spasm  Hyponatremia - sodium 128; check BMP  Anemia, acute blood loss - hemoglobin 10.0; continue ferrous sulfate 325 mg by mouth twice a day; check CBC  SVC thrombosis (diagnosed  09/07/14) - continue Lovenox 80 mg subcutaneous daily  Metastatic squamous cell lung cancer - follow-up with oncology; continue Atrovent 0.02% neb inhaled 0.5 mg 3 times a day  Depression - continue Cymbalta 30 mg by mouth daily  Constipation due to opioid therapy - continue MiraLAX 17 g by mouth 3 times a day and senna S1 tab by mouth daily at bedtime  Anxiety - continue Ativan 1 mg 1 tab by mouth every 6 hours when necessary  GERD - continue omeprazole 20 mg 1 tab by mouth twice a day     Goals of care:  Short-term rehabilitation    [facility], [facility] [phone]

## 2015-02-19 ENCOUNTER — Non-Acute Institutional Stay (SKILLED_NURSING_FACILITY): Payer: 59 | Admitting: Internal Medicine

## 2015-02-19 ENCOUNTER — Encounter: Payer: Self-pay | Admitting: Internal Medicine

## 2015-02-19 DIAGNOSIS — D62 Acute posthemorrhagic anemia: Secondary | ICD-10-CM | POA: Diagnosis not present

## 2015-02-19 DIAGNOSIS — E871 Hypo-osmolality and hyponatremia: Secondary | ICD-10-CM

## 2015-02-19 DIAGNOSIS — E43 Unspecified severe protein-calorie malnutrition: Secondary | ICD-10-CM

## 2015-02-19 DIAGNOSIS — S72002S Fracture of unspecified part of neck of left femur, sequela: Secondary | ICD-10-CM

## 2015-02-19 DIAGNOSIS — I82210 Acute embolism and thrombosis of superior vena cava: Secondary | ICD-10-CM

## 2015-02-19 DIAGNOSIS — K219 Gastro-esophageal reflux disease without esophagitis: Secondary | ICD-10-CM

## 2015-02-19 DIAGNOSIS — K5903 Drug induced constipation: Secondary | ICD-10-CM

## 2015-02-19 DIAGNOSIS — R5381 Other malaise: Secondary | ICD-10-CM | POA: Diagnosis not present

## 2015-02-19 DIAGNOSIS — F329 Major depressive disorder, single episode, unspecified: Secondary | ICD-10-CM | POA: Diagnosis not present

## 2015-02-19 DIAGNOSIS — I82290 Acute embolism and thrombosis of other thoracic veins: Secondary | ICD-10-CM

## 2015-02-19 DIAGNOSIS — T402X5A Adverse effect of other opioids, initial encounter: Secondary | ICD-10-CM | POA: Diagnosis not present

## 2015-02-19 DIAGNOSIS — C3490 Malignant neoplasm of unspecified part of unspecified bronchus or lung: Secondary | ICD-10-CM | POA: Diagnosis not present

## 2015-02-19 DIAGNOSIS — R2681 Unsteadiness on feet: Secondary | ICD-10-CM

## 2015-02-19 DIAGNOSIS — K5909 Other constipation: Secondary | ICD-10-CM

## 2015-02-19 DIAGNOSIS — F32A Depression, unspecified: Secondary | ICD-10-CM

## 2015-02-19 NOTE — Progress Notes (Signed)
Patient ID: [name], female   DOB: [birth year], 49 y.o.   MRN: [id number]     [place]  PCP: No primary care provider on file.  Code Status: DNR  Allergies  Allergen Reactions  . Penicillins Rash    Chief Complaint  Patient presents with  . New Admit To SNF    New Admission      HPI:  49 y.o. patient is here for short term rehabilitation post hospital admission from fall with left hip pain. She had left hip fracture and underwent left total hip arthroplasty on 02/10/15. 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 She required 2 u prbc transfusion. 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 She is seen in her room today. Her pain is not under control with current regimen. She mentions being followed by palliative care with [facility] Borders at home  Review of Systems:  Constitutional: positive for fatigue. Negative for fever, chills, diaphoresis.  HENT: Negative for headache, congestion, nasal discharge Eyes: Negative for eye pain, blurred vision, double vision and discharge.  Respiratory: Negative for cough, wheezing.   Cardiovascular: Negative for chest pain, palpitations, leg swelling.  Gastrointestinal: Negative for heartburn, nausea, vomiting, abdominal pain. Has regular bowel movement Genitourinary: Negative for dysuria Musculoskeletal: Negative for falls in the facility    Skin: Negative for rash.  Neurological: positive for weakness,dizziness Psychiatric/Behavioral: Negative for depression  Past Medical History  Diagnosis Date  . Cancer     left bronchioles  . Radiation 11/24/14-12/05/14    Left ribs 30 Gy in 10 fractions   Past Surgical History  Procedure Laterality Date  . Esophagogastroduodenoscopy N/A 09/14/2014    Procedure: ESOPHAGOGASTRODUODENOSCOPY (EGD);  Surgeon: Teena Irani, MD;  Location: [facility];  Service:  Endoscopy;  Laterality: N/A;  . Total hip arthroplasty Left 02/10/2015    Procedure:   LEFT HIP BIPOLAR HEMI ARTHROPLASTY;  Surgeon: Mcarthur Rossetti, MD;  Location: [facility];  Service: Orthopedics;  Laterality: Left;   Social History:   reports that she quit smoking about 5 months ago. She does not have any smokeless tobacco history on file. She reports that she does not drink alcohol or use illicit drugs.  Family History  Problem Relation Age of Onset  . Cancer Mother   . Heart failure Father     Medications:   Medication List       This list is accurate as of: 02/19/15 10:47 AM.  Always use your most recent med list.               acetaminophen 325 MG tablet  Commonly known as:  TYLENOL  Take 2 tablets (650 mg total) by mouth every 6 (six) hours as needed for mild pain (or Fever >/= 101).     DULoxetine 30 MG capsule  Commonly known as:  CYMBALTA  Take 30 mg by mouth daily.     enoxaparin 80 MG/0.8ML injection  Commonly known as:  LOVENOX  ADMINISTER 0.8 ML UNDER THE SKIN DAILY     fentaNYL 100 MCG/HR  Commonly known as:  DURAGESIC - dosed mcg/hr  Place 1 patch (100 mcg total) onto the skin every 3 (three) days.     ferrous sulfate 325 (65 FE) MG EC tablet  Take 1 tablet (325 mg total) by mouth 2 (two) times daily with a meal.  ipratropium 0.02 % nebulizer solution  Commonly known as:  ATROVENT  Take 2.5 mLs (0.5 mg total) by nebulization 3 (three) times daily.     LIDODERM 5 %  Generic drug:  lidocaine  1 patch. To left ant chest wall, 12 hours on/12 hours off, Remove & Discard patch within 12 hours or as directed by MD     LORazepam 1 MG tablet  Commonly known as:  ATIVAN  Take 1 tablet (1 mg total) by mouth every 6 (six) hours as needed for anxiety.     methocarbamol 500 MG tablet  Commonly known as:  ROBAXIN  Take 500 mg by mouth 3 (three) times daily.     morphine 30 MG 12 hr tablet  Commonly known as:  MS CONTIN  Take 1 tablet (30 mg total) by  mouth every 8 (eight) hours.     morphine 15 MG 12 hr tablet  Commonly known as:  MS CONTIN  Take 1 tablet (15 mg total) by mouth 3 (three) times daily.     morphine 15 MG tablet  Commonly known as:  MSIR  Take 2 tablets (30 mg total) by mouth every 4 (four) hours as needed for severe pain.     omeprazole 20 MG capsule  Commonly known as:  PRILOSEC  Take 1 capsule (20 mg total) by mouth 2 (two) times daily before a meal.     ondansetron 8 MG tablet  Commonly known as:  ZOFRAN  TAKE 1 TABLET(8 MG) BY MOUTH EVERY 8 HOURS AS NEEDED FOR NAUSEA OR VOMITING     polyethylene glycol packet  Commonly known as:  MIRALAX / GLYCOLAX  Three times daily for constipation     senna-docusate 8.6-50 MG per tablet  Commonly known as:  Senokot-S  Take 1 tablet by mouth at bedtime.         Physical Exam: Filed Vitals:   02/19/15 1038  BP: 126/86  Pulse: 88  Temp: 97 F (36.1 C)  TempSrc: Oral  Resp: 20  Height: '5\' 7"'$  (1.702 m)  Weight: 110 lb 3.2 oz (49.986 kg)  SpO2: 95%    General- adult female, thin built, frail and ill appearing, in no acute distress Head- normocephalic, atraumatic Nose- normal nasal mucosa, no maxillary or frontal sinus tenderness, no nasal discharge Throat- moist mucus membrane Eyes- PERRLA, EOMI, no pallor, no icterus, no discharge, normal conjunctiva, normal sclera Neck- no cervical lymphadenopathy Cardiovascular- normal s1,s2, no murmurs, trace leg edema Respiratory- bilateral poor air entry  Abdomen- bowel sounds present, soft, non tender Musculoskeletal- able to move all 4 extremities, generalized weakness with ROM limited to left leg Neurological- no focal deficit, alert and oriented to person, place and time Skin- warm and dry, aquacel dressing to left hip Psychiatry- normal mood and affect    Labs reviewed: Basic Metabolic Panel:  Recent Labs  09/16/14 0100  09/19/14 0500  02/11/15 0443 02/12/15 0456 02/13/15 0549 02/14/15 0456  NA 131*   < > 132*  < > 130* 130* 127* 128*  K 2.9*  < > 3.7  < > 3.7 4.6 3.5 3.6  CL 93*  < > 94*  < > 101 98* 96* 96*  CO2 31  < > 32  < > '27 26 27 25  '$ GLUCOSE 93  < > 138*  < > 126* 94 98 76  BUN 8  < > 6  < > 6 <5* 6 6  CREATININE 0.43*  < > 0.40*  < >  0.47 0.51 0.37* 0.36*  CALCIUM 7.9*  < > 8.2*  < > 8.1* 8.9 8.5* 8.6*  MG 1.4*  --  1.6  --  1.5*  --   --   --   PHOS  --   --  2.3  --   --   --   --   --   < > = values in this interval not displayed. Liver Function Tests:  Recent Labs  01/26/15 1302 02/09/15 1057 02/10/15 0325  AST <7 7 9*  ALT <6 <6 <5*  ALKPHOS 158* 150 137*  BILITOT 0.24 0.41 0.5  PROT 5.9* 5.5* 5.6*  ALBUMIN 2.2* 2.2* 2.4*    Recent Labs  09/10/14 1915  LIPASE 13   No results for input(s): AMMONIA in the last 8760 hours. CBC:  Recent Labs  01/26/15 1302 02/09/15 1056 02/09/15 1613  02/11/15 0443 02/12/15 0456 02/13/15 0549  WBC 9.5 9.5 10.6*  < > 12.7* 10.8* 9.9  NEUTROABS 8.2* 8.2* 9.2*  --   --   --   --   HGB 10.7* 10.0* 10.7*  < > 7.6* 10.5* 10.0*  HCT 33.3* 30.3* 33.6*  < > 24.5* 32.1* 30.9*  MCV 76.6* 74.6* 77.2*  < > 77.8* 77.3* 76.3*  PLT 835* 702* 564*  < > 408* 401* 414*  < > = values in this interval not displayed. Cardiac Enzymes:  Recent Labs  09/13/14 1712 09/13/14 2259 09/14/14 0330  TROPONINI <0.03 <0.03 <0.03   BNP: Invalid input(s): POCBNP CBG:  Recent Labs  10/22/14 1419  GLUCAP 85    Radiological Exams: Dg Ribs Unilateral W/chest Left  02/09/2015   CLINICAL DATA:  Fall last Wednesday left mid axillary rib pain.  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 No confluent opacities on the right. No visible rib fracture. Heart is normal size.  IMPRESSION: Continued complete opacification of the left hemithorax as seen on prior chest CT.   Electronically Signed    By: Rolm Baptise M.D.   On: 02/09/2015 14:55   Pelvis Portable  02/10/2015   CLINICAL DATA:  Patient status post left hip hemiarthroplasty.  EXAM: PORTABLE PELVIS 1-2 VIEWS  COMPARISON:  CT pelvis 02/09/2015  FINDINGS: Patient status post left hip hemiarthroplasty. AP portable pelvic images demonstrate hardware to be in appropriate position. Overlying surgical staple line. Soft tissue gas compatible with postoperative state.  IMPRESSION: Patient status post left hip hemiarthroplasty.   Electronically Signed   By: Lovey Newcomer M.D.   On: 02/10/2015 18:47   Ct Hip Left Wo Contrast  02/09/2015   CLINICAL DATA:  Status post fall 6 days ago.  Left hip pain.  EXAM: CT OF THE LEFT HIP WITHOUT CONTRAST  TECHNIQUE: Multidetector CT imaging of the left hip was performed according to the standard protocol. Multiplanar CT image reconstructions were also generated.  COMPARISON:  None.  FINDINGS: There is a comminuted, impacted and mildly displaced left subcapital fracture. There is no other fracture or dislocation. There is no lytic or sclerotic osseous lesion. The superior and inferior pubic rami are intact. The muscles are normal. There is no fluid collection or hematoma.  There is no pelvic free fluid.  Visualized bladder is normal.  IMPRESSION: Comminuted, impacted and mildly displaced left femoral subcapital fracture.   Electronically Signed   By: Kathreen Devoid  On: 02/09/2015 17:04   Dg C-arm 1-60 Min-no Report  02/10/2015   CLINICAL DATA: surgery   C-ARM 1-60 MINUTES  Fluoroscopy was utilized by the requesting physician.  No radiographic  interpretation.    Dg Hip Operative Unilat With Pelvis Left  02/10/2015   CLINICAL DATA:  Patient with left hip fracture.  EXAM: OPERATIVE LEFT HIP (WITH PELVIS IF PERFORMED) 2 VIEWS  TECHNIQUE: Fluoroscopic spot image(s) were submitted for interpretation post-operatively.  COMPARISON:  Head CT 10/22/2014; left hip CT 02/09/2015  FINDINGS: Patient status post left hip  arthroplasty. Hardware appears in appropriate position.  IMPRESSION: Patient status post left hip arthroplasty.   Electronically Signed   By: Lovey Newcomer M.D.   On: 02/10/2015 18:01   Dg Hip Unilat W Or W/o Pelvis 2-3 Views Left  02/09/2015   CLINICAL DATA:  Fall, left hip pain  EXAM: DG HIP (WITH OR WITHOUT PELVIS) 2-3V LEFT  COMPARISON:  None.  FINDINGS: Subcapital left hip fracture with foreshortening and varus angulation.  Bilateral hip joint spaces are symmetric.  Visualized bony pelvis appears intact.  IMPRESSION: Subcapital left hip fracture, as above.  These results will be called to the ordering clinician or representative by the Radiology Department at the imaging location.   Electronically Signed   By: Julian Hy M.D.   On: 02/09/2015 14:56    Assessment/Plan  Unsteady gait Post left hip fracture. Will have patient work with PT/OT as tolerated to regain strength and restore function.  Fall precautions are in place.  Physical deconditioning With her recent fall and hip fracture along with her metastatic lung cancer. Poor prognosis. Will have her work with physical therapy and occupational therapy team to help with gait training and muscle strengthening exercises.fall precautions. Skin care. Encourage to be out of bed. Consider palliative care services in the facility if no improvement  Left hip fracture  S/P left total hip arthroplasty anterior approach. continue fentanyl 100 mcg patch q3 days. Continue morphine 45 mg po tid and morphine sulfate IR 15 mg 2 tabs q4h prn breakthrough pain.  Continue Robaxin 500 mg tid for muscle spasm. Pending physiatry consult  Blood loss anemia Continue ferrous sulfate 325 mg bid and monitor cbc  Protein calorie malnutrition Monitor po intake, encourage po intake and monitor weight. 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 Monitor clinically  SVC thrombosis  continue Lovenox 80 mg subcutaneous  daily  Constipation Continue miralax tid with senna s daily and monitor  Metastatic squamous cell lung cancer follow-up with oncology. continue Atrovent 0.02% neb tid  Depression continue Cymbalta 30 mg daily  GERD continue omeprazole 20 mg bid   Goals of care: short term rehabilitation   Labs/tests ordered: cbc, bmp  Family/ staff Communication: reviewed care plan with patient and nursing supervisor    Blanchie Serve, MD  [facility] [phone] (Monday-Friday 8 am - 5 pm) [phone] (afterhours)

## 2015-02-24 ENCOUNTER — Other Ambulatory Visit: Payer: Self-pay | Admitting: Physician Assistant

## 2015-02-24 ENCOUNTER — Ambulatory Visit: Payer: 59

## 2015-02-24 ENCOUNTER — Other Ambulatory Visit (HOSPITAL_BASED_OUTPATIENT_CLINIC_OR_DEPARTMENT_OTHER): Payer: 59

## 2015-02-24 ENCOUNTER — Other Ambulatory Visit: Payer: Self-pay | Admitting: *Deleted

## 2015-02-24 ENCOUNTER — Ambulatory Visit (HOSPITAL_BASED_OUTPATIENT_CLINIC_OR_DEPARTMENT_OTHER): Payer: 59 | Admitting: Physician Assistant

## 2015-02-24 ENCOUNTER — Encounter: Payer: Self-pay | Admitting: Physician Assistant

## 2015-02-24 VITALS — BP 89/63 | HR 105 | Temp 98.1°F | Resp 18

## 2015-02-24 DIAGNOSIS — E43 Unspecified severe protein-calorie malnutrition: Secondary | ICD-10-CM

## 2015-02-24 DIAGNOSIS — C3492 Malignant neoplasm of unspecified part of left bronchus or lung: Secondary | ICD-10-CM

## 2015-02-24 DIAGNOSIS — C782 Secondary malignant neoplasm of pleura: Secondary | ICD-10-CM

## 2015-02-24 DIAGNOSIS — S72012D Unspecified intracapsular fracture of left femur, subsequent encounter for closed fracture with routine healing: Secondary | ICD-10-CM

## 2015-02-24 DIAGNOSIS — R630 Anorexia: Secondary | ICD-10-CM

## 2015-02-24 DIAGNOSIS — G893 Neoplasm related pain (acute) (chronic): Secondary | ICD-10-CM

## 2015-02-24 DIAGNOSIS — C3490 Malignant neoplasm of unspecified part of unspecified bronchus or lung: Secondary | ICD-10-CM

## 2015-02-24 LAB — CBC WITH DIFFERENTIAL/PLATELET
BASO%: 0.5 % (ref 0.0–2.0)
BASOS ABS: 0.1 10*3/uL (ref 0.0–0.1)
EOS%: 0.6 % (ref 0.0–7.0)
Eosinophils Absolute: 0.1 10*3/uL (ref 0.0–0.5)
HEMATOCRIT: 36.8 % (ref 34.8–46.6)
HGB: 12 g/dL (ref 11.6–15.9)
LYMPH%: 3.4 % — ABNORMAL LOW (ref 14.0–49.7)
MCH: 24.7 pg — AB (ref 25.1–34.0)
MCHC: 32.6 g/dL (ref 31.5–36.0)
MCV: 75.9 fL — ABNORMAL LOW (ref 79.5–101.0)
MONO#: 1 10*3/uL — ABNORMAL HIGH (ref 0.1–0.9)
MONO%: 8 % (ref 0.0–14.0)
NEUT#: 11 10*3/uL — ABNORMAL HIGH (ref 1.5–6.5)
NEUT%: 87.5 % — AB (ref 38.4–76.8)
Platelets: 975 10*3/uL — ABNORMAL HIGH (ref 145–400)
RBC: 4.85 10*6/uL (ref 3.70–5.45)
RDW: 18.5 % — ABNORMAL HIGH (ref 11.2–14.5)
WBC: 12.6 10*3/uL — ABNORMAL HIGH (ref 3.9–10.3)
lymph#: 0.4 10*3/uL — ABNORMAL LOW (ref 0.9–3.3)

## 2015-02-24 LAB — COMPREHENSIVE METABOLIC PANEL (CC13)
ALT: <6 U/L (ref 0–55)
AST: 12 U/L (ref 5–34)
Albumin: 2.6 g/dL — ABNORMAL LOW (ref 3.5–5.0)
Alkaline Phosphatase: 165 U/L — ABNORMAL HIGH (ref 40–150)
Anion Gap: 10 mEq/L (ref 3–11)
BUN: 5.4 mg/dL — AB (ref 7.0–26.0)
CALCIUM: 9.8 mg/dL (ref 8.4–10.4)
CHLORIDE: 97 meq/L — AB (ref 98–109)
CO2: 23 mEq/L (ref 22–29)
Creatinine: 0.6 mg/dL (ref 0.6–1.1)
EGFR: >90 mL/min/{1.73_m2} (ref >90)
Glucose: 120 mg/dl (ref 70–140)
POTASSIUM: 3.8 meq/L (ref 3.5–5.1)
SODIUM: 130 meq/L — AB (ref 136–145)
Total Bilirubin: 0.67 mg/dL (ref 0.20–1.20)
Total Protein: 6 g/dL — ABNORMAL LOW (ref 6.4–8.3)

## 2015-02-24 MED ORDER — HYDROMORPHONE HCL 4 MG/ML IJ SOLN
2.0000 mg | INTRAMUSCULAR | Status: AC
  Administered 2015-02-24: 2 mg via INTRAMUSCULAR

## 2015-02-24 MED ORDER — LORAZEPAM 1 MG PO TABS: 1.0000 mg | ORAL_TABLET | Freq: Four times a day (QID) | ORAL | Status: AC | PRN

## 2015-02-24 MED ORDER — HYDROMORPHONE HCL 4 MG/ML IJ SOLN
INTRAMUSCULAR | Status: AC
  Filled 2015-02-24: qty 1

## 2015-02-24 NOTE — Telephone Encounter (Signed)
[facility] 

## 2015-02-24 NOTE — Progress Notes (Addendum)
[place] [phone]   [phone]  OFFICE PROGRESS NOTE  No primary care provider on file. No primary provider on file.  DIAGNOSIS:  1) Metastatic non-small cell lung cancer, squamous cell carcinoma initially diagnosed as a stage IIIB diagnosed in March 2015. 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  PRIOR THERAPY:  1) Status post course of concurrent chemoradiation with weekly carboplatin and paclitaxel but the patient did not receive any consolidation chemotherapy.  CURRENT THERAPY: Immunotherapy with Nivolumab 3 MG/KG every 2 weeks. First dose 11/18/2014. Status post 7 cycles.   INTERVAL HISTORY: [name] 49 y.o. female returns to the clinic today for follow-up visit accompanied by her brother. The patient complains of increased pain in her left ribs and left hip pain. She is status post left hip arthroplasty due to a subcapital fracture. She is currently at [facility] for rehabilitation. Her pain medications have been adjusted due to some muscular tightness she was placed on Robaxin and diclofenac was discontinued. She previously had reasonable control of her pain with a diclofenac MS Contin and MSIR. Her brother notes that since she had been started on the Robaxin she seems to be hallucinating. They both feel that she is back at [address],  with her pain completely out of control. She is unable to sit for long period of time and is not up to receiving cycle #8 of her immunotherapy today as scheduled. She is tearful and appears miserable and uncomfortable. She is not eating or drinking well at all. She was unable to stand to obtain a weight today. She continues to have mild shortness of breath as well as mild cough but no hemoptysis. She has no nausea or vomiting, no fever or chills. She is tolerating her treatment with Nivolumab fairly well. She denied any increase in her baseline  shortness of breath, denied diarrhea or skin rashes. Her overall performance status has diminished significantly. She reports that her CODE STATUS is DO NOT RESUSCITATE.  MEDICAL HISTORY: Past Medical History  Diagnosis Date  . Cancer     left bronchioles  . Radiation 11/24/14-12/05/14    Left ribs 30 Gy in 10 fractions    ALLERGIES:  is allergic to penicillins.  MEDICATIONS:  Current Outpatient Prescriptions  Medication Sig Dispense Refill  . acetaminophen (TYLENOL) 325 MG tablet Take 2 tablets (650 mg total) by mouth every 6 (six) hours as needed for mild pain (or Fever >/= 101).    . DULoxetine (CYMBALTA) 30 MG capsule Take 30 mg by mouth daily.     Marland Kitchen enoxaparin (LOVENOX) 80 MG/0.8ML injection ADMINISTER 0.8 ML UNDER THE SKIN DAILY 24 mL 0  . fentaNYL (DURAGESIC - DOSED MCG/HR) 100 MCG/HR Place 1 patch (100 mcg total) onto the skin every 3 (three) days. 5 patch 0  . ferrous sulfate 325 (65 FE) MG EC tablet Take 1 tablet (325 mg total) by mouth 2 (two) times daily with a meal. 60 tablet 1  . ipratropium (ATROVENT) 0.02 % nebulizer solution Take 2.5 mLs (0.5 mg total) by nebulization 3 (three) times daily. 75 mL 0  . lidocaine (LIDODERM) 5 % 1 patch. To left ant chest wall, 12 hours on/12 hours off, Remove & Discard patch within 12 hours or as directed by MD    . LORazepam (ATIVAN) 1 MG tablet Take 1 tablet (1 mg total) by mouth every 6 (six) hours as needed for anxiety.  30 tablet 0  . methocarbamol (ROBAXIN) 500 MG tablet Take 500 mg by mouth 3 (three) times daily.    Marland Kitchen morphine (MS CONTIN) 15 MG 12 hr tablet Take 1 tablet (15 mg total) by mouth 3 (three) times daily. 60 tablet 0  . morphine (MS CONTIN) 30 MG 12 hr tablet Take 1 tablet (30 mg total) by mouth every 8 (eight) hours. 90 tablet 0  . morphine (MSIR) 15 MG tablet Take 2 tablets (30 mg total) by mouth every 4 (four) hours as needed for severe pain. 60 tablet 0  . omeprazole (PRILOSEC) 20 MG capsule Take 1 capsule (20 mg total) by  mouth 2 (two) times daily before a meal. 60 capsule 1  . ondansetron (ZOFRAN) 8 MG tablet TAKE 1 TABLET(8 MG) BY MOUTH EVERY 8 HOURS AS NEEDED FOR NAUSEA OR VOMITING 20 tablet 0  . polyethylene glycol (MIRALAX / GLYCOLAX) packet Three times daily for constipation    . senna-docusate (SENOKOT-S) 8.6-50 MG per tablet Take 1 tablet by mouth at bedtime. (Patient taking differently: Take 1 tablet by mouth daily. ) 30 tablet 2   No current facility-administered medications for this visit.    SURGICAL HISTORY:  Past Surgical History  Procedure Laterality Date  . Esophagogastroduodenoscopy N/A 09/14/2014    Procedure: ESOPHAGOGASTRODUODENOSCOPY (EGD);  Surgeon: Teena Irani, MD;  Location: [facility];  Service: Endoscopy;  Laterality: N/A;  . Total hip arthroplasty Left 02/10/2015    Procedure:   LEFT HIP BIPOLAR HEMI ARTHROPLASTY;  Surgeon: Mcarthur Rossetti, MD;  Location: [facility];  Service: Orthopedics;  Laterality: Left;    REVIEW OF SYSTEMS:  Constitutional: positive for anorexia, fatigue, weight loss and Appears cachectic Eyes: negative Ears, nose, mouth, throat, and face: negative Respiratory: positive for cough and dyspnea on exertion Cardiovascular: negative Gastrointestinal: negative Genitourinary:negative Integument/breast: negative Hematologic/lymphatic: negative Musculoskeletal:positive for arthralgias, bone pain and increased left rib pain and left hip pain both worse and uncontrolled with her current pain medications.  Neurological: negative Behavioral/Psych: negative Endocrine: negative Allergic/Immunologic: negative   PHYSICAL EXAMINATION: General appearance: alert, cooperative, appears older than stated age, cachectic, fatigued and Tearful, sobbing, obviously in severe discomfort Head: Normocephalic, without obvious abnormality, atraumatic Neck: no adenopathy, no JVD, supple, symmetrical, trachea midline and thyroid not enlarged, symmetric, no  tenderness/mass/nodules Lymph nodes: Cervical, supraclavicular, and axillary nodes normal. Resp: clear to auscultation bilaterally Back: symmetric, no curvature. ROM normal. No CVA tenderness. Cardio: regular rate and rhythm, S1, S2 normal, no murmur, click, rub or gallop GI: soft, non-tender; bowel sounds normal; no masses,  no organomegaly Extremities: extremities normal, atraumatic, no cyanosis or edema Neurologic: Alert and oriented X 3, normal strength and tone. Normal symmetric reflexes. Normal coordination and gait Increased pain in left rib pain, point tenderness left hip/groin area  ECOG PERFORMANCE STATUS: 2 - Symptomatic, <50% confined to bed  Blood pressure 89/63, pulse 105, temperature 98.1 F (36.7 C), temperature source Oral, resp. rate 18, SpO2 100 %.  LABORATORY DATA: Lab Results  Component Value Date   WBC 12.6* 02/24/2015   HGB 12.0 02/24/2015   HCT 36.8 02/24/2015   MCV 75.9* 02/24/2015   PLT 975* 02/24/2015      Chemistry      Component Value Date/Time   NA 130* 02/24/2015 1053   NA 128* 02/14/2015 0456   K 3.8 02/24/2015 1053   K 3.6 02/14/2015 0456   CL 96* 02/14/2015 0456   CO2 23 02/24/2015 1053   CO2 25 02/14/2015 0456  BUN 5.4* 02/24/2015 1053   BUN 6 02/14/2015 0456   CREATININE 0.6 02/24/2015 1053   CREATININE 0.36* 02/14/2015 0456      Component Value Date/Time   CALCIUM 9.8 02/24/2015 1053   CALCIUM 8.6* 02/14/2015 0456   ALKPHOS 165* 02/24/2015 1053   ALKPHOS 137* 02/10/2015 0325   AST 12 02/24/2015 1053   AST 9* 02/10/2015 0325   ALT <6 02/24/2015 1053   ALT <5* 02/10/2015 0325   BILITOT 0.67 02/24/2015 1053   BILITOT 0.5 02/10/2015 0325       RADIOGRAPHIC STUDIES: Dg Ribs Unilateral W/chest Left  02/09/2015   CLINICAL DATA:  Fall last Wednesday left mid axillary rib pain.  EXAM: LEFT RIBS AND CHEST - 3+ VIEW  COMPARISON:  01/07/2015  FINDINGS: There is complete opacification of the left hemithorax as seen on prior CT, likely  related to known pleural metastatic disease. No confluent opacities on the right. No visible rib fracture. Heart is normal size.  IMPRESSION: Continued complete opacification of the left hemithorax as seen on prior chest CT.   Electronically Signed   By: Rolm Baptise M.D.   On: 02/09/2015 14:55   Pelvis Portable  02/10/2015   CLINICAL DATA:  Patient status post left hip hemiarthroplasty.  EXAM: PORTABLE PELVIS 1-2 VIEWS  COMPARISON:  CT pelvis 02/09/2015  FINDINGS: Patient status post left hip hemiarthroplasty. AP portable pelvic images demonstrate hardware to be in appropriate position. Overlying surgical staple line. Soft tissue gas compatible with postoperative state.  IMPRESSION: Patient status post left hip hemiarthroplasty.   Electronically Signed   By: Lovey Newcomer M.D.   On: 02/10/2015 18:47   Ct Hip Left Wo Contrast  02/09/2015   CLINICAL DATA:  Status post fall 6 days ago.  Left hip pain.  EXAM: CT OF THE LEFT HIP WITHOUT CONTRAST  TECHNIQUE: Multidetector CT imaging of the left hip was performed according to the standard protocol. Multiplanar CT image reconstructions were also generated.  COMPARISON:  None.  FINDINGS: There is a comminuted, impacted and mildly displaced left subcapital fracture. There is no other fracture or dislocation. There is no lytic or sclerotic osseous lesion. The superior and inferior pubic rami are intact. The muscles are normal. There is no fluid collection or hematoma.  There is no pelvic free fluid.  Visualized bladder is normal.  IMPRESSION: Comminuted, impacted and mildly displaced left femoral subcapital fracture.   Electronically Signed   By: Kathreen Devoid   On: 02/09/2015 17:04   Dg C-arm 1-60 Min-no Report  02/10/2015   CLINICAL DATA: surgery   C-ARM 1-60 MINUTES  Fluoroscopy was utilized by the requesting physician.  No radiographic  interpretation.    Dg Hip Operative Unilat With Pelvis Left  02/10/2015   CLINICAL DATA:  Patient with left hip fracture.  EXAM:  OPERATIVE LEFT HIP (WITH PELVIS IF PERFORMED) 2 VIEWS  TECHNIQUE: Fluoroscopic spot image(s) were submitted for interpretation post-operatively.  COMPARISON:  Head CT 10/22/2014; left hip CT 02/09/2015  FINDINGS: Patient status post left hip arthroplasty. Hardware appears in appropriate position.  IMPRESSION: Patient status post left hip arthroplasty.   Electronically Signed   By: Lovey Newcomer M.D.   On: 02/10/2015 18:01   Dg Hip Unilat W Or W/o Pelvis 2-3 Views Left  02/09/2015   CLINICAL DATA:  Fall, left hip pain  EXAM: DG HIP (WITH OR WITHOUT PELVIS) 2-3V LEFT  COMPARISON:  None.  FINDINGS: Subcapital left hip fracture with foreshortening and varus angulation.  Bilateral  hip joint spaces are symmetric.  Visualized bony pelvis appears intact.  IMPRESSION: Subcapital left hip fracture, as above.  These results will be called to the ordering clinician or representative by the Radiology Department at the imaging location.   Electronically Signed   By: Julian Hy M.D.   On: 02/09/2015 14:56    ASSESSMENT AND PLAN: This is a very pleasant 49 year old white female with metastatic non-small cell lung cancer, squamous cell carcinoma status post a course of concurrent chemoradiation as well as palliative radiotherapy to the left side of the chest. The patient is currently undergoing treatment with immunotherapy with Nivolumab status post 7 cycles. She is tolerating her treatment fairly well with no significant adverse effects. The patient has improvement in her symptoms since starting the immunotherapy. Unfortunately the recent CT scan of the chest showed questionable disease progression but this was done without contrast. The patient opted to continue Nivolumab instead of switching to chemo.  The patient was discussed with and also seen by Dr. Julien Nordmann. Her oral performance status has decreased significantly. Her pain is now as she states "unbearable". She would like to discontinue treatment and is in  agreement with hospice referral. She would best be served by being placed in a hospice facility i.e. [facility]. We will discontinue her immunotherapy treatment and she will would not be strong enough to tolerate any systemic chemotherapy. Her brother is in agreement with referral [facility] services with the facility placement if at all possible. In the interim regarding her pain medication. We will restart the diclofenac at 50 mg by mouth twice daily in addition to her current pain medications. Further pain management will be driven by hospice and comfort care. Dr. Julien Nordmann will be the physician of record for the hospice referral with hospice physicians providing symptom management.   The patient voices understanding of current disease status and treatment options and is in agreement with the current care plan.  All questions were answered. The patient knows to call the clinic with any problems, questions or concerns. We can certainly see the patient much sooner if necessary.  Carlton Adam, PA-C 02/24/2015    ADDENDUM: Hematology/Oncology Attending: I had a face to face encounter with the patient. I recommended her care plan. This is a very pleasant 49 years old white female with metastatic non-small cell lung cancer, squamous cell carcinoma status post concurrent chemoradiation followed by palliative radiation and currently undergoing treatment with immunotherapy with Nivolumab. She has been tolerating her treatment fairly well but the patient has significant deterioration of her general status and severe pain after fracture of her left hip recently. After initial improvement in her pain management her pain is currently uncontrolled after the recent hospitalization. I had a lengthy discussion with the patient and her brother today about her current condition and further treatment options. I do think the patient would be a good candidate for any future treatment at this point especially with  the deterioration of her general condition. I strongly recommended for her to consider palliative care and hospice at this point. The patient may benefit from transferred to [facility] facility. For pain management we will restart her on diclofenac 50 mg by mouth twice a day in addition to her current pain medication with fentanyl patch, MS Contin and MSIR. The patient and her son agreed to the current plan. We will see her on as-needed basis at this point. She was advised to call if she has any other  concerning symptoms in the interval.  Disclaimer: This note was dictated with voice recognition software. Similar sounding words can inadvertently be transcribed and may be missed upon review. Eilleen Kempf., MD 02/25/2015

## 2015-02-24 NOTE — Patient Instructions (Signed)
Per your question being referred for full hospice services and all immunotherapy and chemotherapy is being discontinued

## 2015-02-25 ENCOUNTER — Telehealth: Payer: Self-pay | Admitting: *Deleted

## 2015-02-25 ENCOUNTER — Other Ambulatory Visit: Payer: Self-pay | Admitting: *Deleted

## 2015-02-25 ENCOUNTER — Telehealth: Payer: Self-pay | Admitting: Internal Medicine

## 2015-02-25 DIAGNOSIS — C3490 Malignant neoplasm of unspecified part of unspecified bronchus or lung: Secondary | ICD-10-CM

## 2015-02-25 DIAGNOSIS — C799 Secondary malignant neoplasm of unspecified site: Secondary | ICD-10-CM

## 2015-02-25 NOTE — Telephone Encounter (Signed)
Call placed to [facility] s/w Maudie Mercury, RN advised hospice referral has been made for pt.

## 2015-02-25 NOTE — Telephone Encounter (Signed)
Hospice referral made. Faxed referral to [phone]

## 2015-02-25 NOTE — Telephone Encounter (Signed)
cx appt per pof pt on hospice

## 2015-02-27 ENCOUNTER — Other Ambulatory Visit: Payer: Self-pay | Admitting: *Deleted

## 2015-02-27 MED ORDER — OXYCODONE HCL 15 MG PO TABS: ORAL_TABLET | ORAL | Status: DC

## 2015-02-27 NOTE — Telephone Encounter (Signed)
[facility] 

## 2015-03-02 ENCOUNTER — Telehealth: Payer: Self-pay | Admitting: Medical Oncology

## 2015-03-02 ENCOUNTER — Emergency Department (HOSPITAL_COMMUNITY): Payer: 59

## 2015-03-02 ENCOUNTER — Inpatient Hospital Stay (HOSPITAL_COMMUNITY)
Admission: EM | Admit: 2015-03-02 | Discharge: 2015-03-05 | DRG: 501 | Disposition: A | Discharge status: Skilled Nursing Facility | Payer: 59 | Attending: Orthopaedic Surgery | Admitting: Orthopaedic Surgery

## 2015-03-02 ENCOUNTER — Encounter (HOSPITAL_COMMUNITY): Payer: Self-pay | Admitting: Emergency Medicine

## 2015-03-02 DIAGNOSIS — Z9221 Personal history of antineoplastic chemotherapy: Secondary | ICD-10-CM | POA: Diagnosis not present

## 2015-03-02 DIAGNOSIS — T84021A Dislocation of internal left hip prosthesis, initial encounter: Secondary | ICD-10-CM | POA: Diagnosis present

## 2015-03-02 DIAGNOSIS — W06XXXA Fall from bed, initial encounter: Secondary | ICD-10-CM | POA: Diagnosis present

## 2015-03-02 DIAGNOSIS — Z880 Allergy status to penicillin: Secondary | ICD-10-CM | POA: Diagnosis not present

## 2015-03-02 DIAGNOSIS — Z87891 Personal history of nicotine dependence: Secondary | ICD-10-CM

## 2015-03-02 DIAGNOSIS — Z923 Personal history of irradiation: Secondary | ICD-10-CM | POA: Diagnosis not present

## 2015-03-02 DIAGNOSIS — Y92129 Unspecified place in nursing home as the place of occurrence of the external cause: Secondary | ICD-10-CM

## 2015-03-02 DIAGNOSIS — Z9889 Other specified postprocedural states: Secondary | ICD-10-CM

## 2015-03-02 DIAGNOSIS — C3490 Malignant neoplasm of unspecified part of unspecified bronchus or lung: Secondary | ICD-10-CM | POA: Diagnosis present

## 2015-03-02 DIAGNOSIS — Y792 Prosthetic and other implants, materials and accessory orthopedic devices associated with adverse incidents: Secondary | ICD-10-CM | POA: Diagnosis present

## 2015-03-02 DIAGNOSIS — S73005A Unspecified dislocation of left hip, initial encounter: Secondary | ICD-10-CM | POA: Diagnosis present

## 2015-03-02 DIAGNOSIS — M25559 Pain in unspecified hip: Secondary | ICD-10-CM

## 2015-03-02 DIAGNOSIS — T8130XA Disruption of wound, unspecified, initial encounter: Secondary | ICD-10-CM | POA: Diagnosis present

## 2015-03-02 DIAGNOSIS — Z419 Encounter for procedure for purposes other than remedying health state, unspecified: Secondary | ICD-10-CM

## 2015-03-02 DIAGNOSIS — G8929 Other chronic pain: Secondary | ICD-10-CM | POA: Diagnosis present

## 2015-03-02 DIAGNOSIS — D649 Anemia, unspecified: Secondary | ICD-10-CM | POA: Diagnosis present

## 2015-03-02 DIAGNOSIS — C3492 Malignant neoplasm of unspecified part of left bronchus or lung: Secondary | ICD-10-CM

## 2015-03-02 DIAGNOSIS — Z79899 Other long term (current) drug therapy: Secondary | ICD-10-CM

## 2015-03-02 DIAGNOSIS — W19XXXA Unspecified fall, initial encounter: Secondary | ICD-10-CM

## 2015-03-02 LAB — BASIC METABOLIC PANEL
Anion gap: 7 (ref 5–15)
BUN: 15 mg/dL (ref 6–20)
CHLORIDE: 98 mmol/L — AB (ref 101–111)
CO2: 26 mmol/L (ref 22–32)
CREATININE: 0.5 mg/dL (ref 0.44–1.00)
Calcium: 8.1 mg/dL — ABNORMAL LOW (ref 8.9–10.3)
GFR calc Af Amer: >60 mL/min (ref >60)
GFR calc non Af Amer: >60 mL/min (ref >60)
GLUCOSE: 106 mg/dL — AB (ref 65–99)
Potassium: 3.1 mmol/L — ABNORMAL LOW (ref 3.5–5.1)
Sodium: 131 mmol/L — ABNORMAL LOW (ref 135–145)

## 2015-03-02 LAB — CBC WITH DIFFERENTIAL/PLATELET
Basophils Absolute: 0 10*3/uL (ref 0.0–0.1)
Basophils Relative: 0 % (ref 0–1)
Eosinophils Absolute: 0.4 10*3/uL (ref 0.0–0.7)
Eosinophils Relative: 4 % (ref 0–5)
HEMATOCRIT: 34.2 % — AB (ref 36.0–46.0)
HEMOGLOBIN: 11 g/dL — AB (ref 12.0–15.0)
LYMPHS ABS: 0.5 10*3/uL — AB (ref 0.7–4.0)
Lymphocytes Relative: 4 % — ABNORMAL LOW (ref 12–46)
MCH: 25 pg — AB (ref 26.0–34.0)
MCHC: 32.2 g/dL (ref 30.0–36.0)
MCV: 77.7 fL — AB (ref 78.0–100.0)
MONO ABS: 1 10*3/uL (ref 0.1–1.0)
MONOS PCT: 10 % (ref 3–12)
NEUTROS ABS: 8.4 10*3/uL — AB (ref 1.7–7.7)
NEUTROS PCT: 82 % — AB (ref 43–77)
Platelets: 632 10*3/uL — ABNORMAL HIGH (ref 150–400)
RBC: 4.4 MIL/uL (ref 3.87–5.11)
RDW: 18.2 % — ABNORMAL HIGH (ref 11.5–15.5)
WBC: 10.3 10*3/uL (ref 4.0–10.5)

## 2015-03-02 MED ORDER — HYDROMORPHONE HCL 1 MG/ML IJ SOLN
1.0000 mg | Freq: Once | INTRAMUSCULAR | Status: AC
  Administered 2015-03-02: 1 mg via INTRAVENOUS
  Filled 2015-03-02: qty 1

## 2015-03-02 MED ORDER — FENTANYL CITRATE (PF) 100 MCG/2ML IJ SOLN
50.0000 ug | Freq: Once | INTRAMUSCULAR | Status: AC
  Administered 2015-03-02: 50 ug via INTRAVENOUS
  Filled 2015-03-02: qty 2

## 2015-03-02 MED ORDER — SODIUM CHLORIDE 0.9 % IV BOLUS (SEPSIS)
1000.0000 mL | Freq: Once | INTRAVENOUS | Status: AC
  Administered 2015-03-02: 1000 mL via INTRAVENOUS

## 2015-03-02 MED ORDER — POTASSIUM CHLORIDE CRYS ER 20 MEQ PO TBCR
40.0000 meq | EXTENDED_RELEASE_TABLET | Freq: Once | ORAL | Status: AC
  Administered 2015-03-02: 40 meq via ORAL
  Filled 2015-03-02: qty 2

## 2015-03-02 MED ORDER — MORPHINE SULFATE (PF) 2 MG/ML IV SOLN
2.0000 mg | Freq: Once | INTRAVENOUS | Status: AC
  Administered 2015-03-02: 2 mg via INTRAVENOUS
  Filled 2015-03-02: qty 1

## 2015-03-02 MED ORDER — PROPOFOL 10 MG/ML IV BOLUS
1.0000 mg/kg | Freq: Once | INTRAVENOUS | Status: AC
  Administered 2015-03-02: 50 mg via INTRAVENOUS
  Administered 2015-03-02: 25 mg via INTRAVENOUS
  Administered 2015-03-02: 50 mg via INTRAVENOUS
  Filled 2015-03-02: qty 1

## 2015-03-02 NOTE — ED Notes (Signed)
Patient to radiology.

## 2015-03-02 NOTE — ED Notes (Signed)
Pt from [facility] rehab after L hip replacement. Had a fall on Friday and had an x ray which showed L hip dislocation and possible fracture. Pt has been moved back and forth into wheelchairs in intervening time. Pt has fentanyl patch from facility on R deltoid and was given morphine at the facility prior to transport. Also has hx of lung cancer, but not currently on chemo.

## 2015-03-02 NOTE — ED Notes (Signed)
PA at bedside.

## 2015-03-02 NOTE — ED Provider Notes (Signed)
CSN: [id number]     Arrival date & time 03/02/15  1739 History   First MD Initiated Contact with Patient 03/02/15 1741     Chief Complaint  Patient presents with  . Hip Pain     (Consider location/radiation/quality/duration/timing/severity/associated sxs/prior Treatment) HPI   Please see midlevel note  Past Medical History  Diagnosis Date  . Cancer     left bronchioles  . Radiation 11/24/14-12/05/14    Left ribs 30 Gy in 10 fractions   Past Surgical History  Procedure Laterality Date  . Esophagogastroduodenoscopy N/A 09/14/2014    Procedure: ESOPHAGOGASTRODUODENOSCOPY (EGD);  Surgeon: Teena Irani, MD;  Location: [facility];  Service: Endoscopy;  Laterality: N/A;  . Total hip arthroplasty Left 02/10/2015    Procedure:   LEFT HIP BIPOLAR HEMI ARTHROPLASTY;  Surgeon: Mcarthur Rossetti, MD;  Location: [facility];  Service: Orthopedics;  Laterality: Left;   Family History  Problem Relation Age of Onset  . Cancer Mother   . Heart failure Father    Social History  Substance Use Topics  . Smoking status: Former Smoker    Quit date: 09/07/2014  . Smokeless tobacco: None  . Alcohol Use: No   OB History    No data available     Review of Systems  Please see midlevel note  Allergies  Penicillins  Home Medications   Prior to Admission medications   Medication Sig Start Date End Date Taking? Authorizing Provider  acetaminophen (TYLENOL) 325 MG tablet Take 2 tablets (650 mg total) by mouth every 6 (six) hours as needed for mild pain (or Fever >/= 101). 09/30/14  Yes Bonnielee Haff, MD  DULoxetine (CYMBALTA) 30 MG capsule Take 30 mg by mouth daily.  12/05/14  Yes Historical Provider, MD  enoxaparin (LOVENOX) 80 MG/0.8ML injection ADMINISTER 0.8 ML UNDER THE SKIN DAILY 01/26/15  Yes Maryanna Shape, NP  fentaNYL (DURAGESIC - DOSED MCG/HR) 100 MCG/HR Place 1 patch (100 mcg total) onto the skin every 3 (three) days. 02/14/15  Yes Costin Karlyne Greenspan, MD  ferrous sulfate 325 (65 FE) MG  EC tablet Take 1 tablet (325 mg total) by mouth 2 (two) times daily with a meal. 01/26/15  Yes Maryanna Shape, NP  ipratropium (ATROVENT) 0.02 % nebulizer solution Take 2.5 mLs (0.5 mg total) by nebulization 3 (three) times daily. 11/18/14  Yes Maryanna Shape, NP  lidocaine (LIDODERM) 5 % 1 patch. To left ant chest wall, 12 hours on/12 hours off, Remove & Discard patch within 12 hours or as directed by MD   Yes Historical Provider, MD  LORazepam (ATIVAN) 1 MG tablet Take 1 tablet (1 mg total) by mouth every 6 (six) hours as needed for anxiety. 02/24/15  Yes Estill Dooms, MD  morphine (MS CONTIN) 15 MG 12 hr tablet Take 1 tablet (15 mg total) by mouth 3 (three) times daily. 02/14/15  Yes Costin Karlyne Greenspan, MD  morphine (MS CONTIN) 30 MG 12 hr tablet Take 1 tablet (30 mg total) by mouth every 8 (eight) hours. 02/09/15  Yes Adrena E Johnson, PA-C  morphine (MSIR) 15 MG tablet Take 2 tablets (30 mg total) by mouth every 4 (four) hours as needed for severe pain. 02/14/15  Yes Costin Karlyne Greenspan, MD  omeprazole (PRILOSEC) 20 MG capsule Take 1 capsule (20 mg total) by mouth 2 (two) times daily before a meal. 09/30/14  Yes Bonnielee Haff, MD  ondansetron (ZOFRAN) 8 MG tablet TAKE 1 TABLET(8 MG) BY MOUTH EVERY 8 HOURS AS  NEEDED FOR NAUSEA OR VOMITING 11/26/14  Yes Maryanna Shape, NP  polyethylene glycol (MIRALAX / GLYCOLAX) packet Give 6-8 oz Three times daily for constipation, if BM are more frequent change to BID 02/19/15  Yes Mahima Pandey, MD  senna-docusate (SENOKOT-S) 8.6-50 MG per tablet Take 1 tablet by mouth at bedtime. 09/30/14  Yes Bonnielee Haff, MD  oxyCODONE (ROXICODONE) 15 MG immediate release tablet Take one to two tablets by mouth every 4 hours as needed for breakthrough pain Patient not taking: Reported on 03/02/2015 02/27/15   Gildardo Cranker, DO   BP 96/61 mmHg  Pulse 82  Temp(Src) 98.2 F (36.8 C) (Oral)  Resp 18  SpO2 98% Physical Exam  ED Course  Procedural sedation Date/Time: 03/02/2015 9:17  PM Performed by: Deno Etienne Authorized by: Deno Etienne Consent: Verbal consent obtained. Written consent obtained. Risks and benefits: risks, benefits and alternatives were discussed Consent given by: patient Patient understanding: patient states understanding of the procedure being performed Patient consent: the patient's understanding of the procedure matches consent given Required items: required blood products, implants, devices, and special equipment available Patient identity confirmed: verbally with patient Time out: Immediately prior to procedure a "time out" was called to verify the correct patient, procedure, equipment, support staff and site/side marked as required. Local anesthesia used: no Patient sedated: yes Sedation type: moderate (conscious) sedation Sedatives: propofol Vitals: Vital signs were monitored during sedation. Patient tolerance: Patient tolerated the procedure well with no immediate complications Comments: Patient given '150mg'$  of propofol in '50mg'$  doses until achieved deep sedation.   ORTHOPEDIC INJURY TREATMENT Date/Time: 03/03/2015 1:35 AM Performed by: Tyrone Nine Maralee Higuchi Authorized by: Deno Etienne Consent: Verbal consent obtained. Written consent obtained. Risks and benefits: risks, benefits and alternatives were discussed Consent given by: patient Time out: Immediately prior to procedure a "time out" was called to verify the correct patient, procedure, equipment, support staff and site/side marked as required. Injury location: hip Location details: left hip Injury type: dislocation Dislocation type: posterior Spontaneous dislocation: no Prosthesis: yes Pre-procedure neurovascular assessment: neurovascularly intact Pre-procedure distal perfusion: normal Pre-procedure neurological function: normal Pre-procedure range of motion: reduced Pre-procedure range of motion comment: due to pain Local anesthesia used: no Patient sedated: yes (please see  sedation) Manipulation performed: yes Reduction method: Whistler maneuver (captain morgan) Reduction successful: no Post-procedure neurovascular assessment: post-procedure neurovascularly intact Post-procedure distal perfusion: normal Post-procedure neurological function: normal Post-procedure range of motion: normal Comments: Discussed by midlevel with Dr. Percell Miller prior to procedure, recommended [place] try reduction in the ED.  Patient in deep sedation, failed attempt x4.  Patient with worsening pain post procedure.    (including critical care time) Labs Review Labs Reviewed  CBC WITH DIFFERENTIAL/PLATELET - Abnormal; Notable for the following:    Hemoglobin 11.0 (*)    HCT 34.2 (*)    MCV 77.7 (*)    MCH 25.0 (*)    RDW 18.2 (*)    Platelets 632 (*)    Neutrophils Relative % 82 (*)    Neutro Abs 8.4 (*)    Lymphocytes Relative 4 (*)    Lymphs Abs 0.5 (*)    All other components within normal limits  BASIC METABOLIC PANEL - Abnormal; Notable for the following:    Sodium 131 (*)    Potassium 3.1 (*)    Chloride 98 (*)    Glucose, Bld 106 (*)    Calcium 8.1 (*)    All other components within normal limits    Imaging Review Dg Knee 1-2 Views  Left  03/02/2015   CLINICAL DATA:  Left thigh pain after fall at nursing home 4 days ago.  EXAM: LEFT KNEE - 1-2 VIEW  COMPARISON:  None.  FINDINGS: There is no evidence of fracture, dislocation, or joint effusion. There is no evidence of arthropathy or other focal bone abnormality. Soft tissues are unremarkable.  IMPRESSION: Negative.   Electronically Signed   By: Marin Olp M.D.   On: 03/02/2015 20:15   Ct Head Wo Contrast  03/02/2015   CLINICAL DATA:  Fall on Friday. Dislocation of left hip replacement. Possible head injury at the time of fall. History of lung cancer.  EXAM: CT HEAD WITHOUT CONTRAST  TECHNIQUE: Contiguous axial images were obtained from the base of the skull through the vertex without intravenous contrast.  COMPARISON:   10/22/2014  FINDINGS: The brainstem, cerebellum, cerebral peduncles, thalamus, basal ganglia, basilar cisterns, and ventricular system appear within normal limits. No intracranial hemorrhage, mass lesion, or acute CVA.  IMPRESSION: 1. No significant intracranial abnormality is identified. No current evidence of intracranial metastatic disease on today's noncontrast CT head.   Electronically Signed   By: Van Clines M.D.   On: 03/02/2015 19:56   Dg Hip Unilat With Pelvis Min 4 Views Left  03/02/2015   CLINICAL DATA:  49 year old female with hip pain  EXAM: DG HIP (WITH OR WITHOUT PELVIS) 4+V LEFT  COMPARISON:  Radiograph dated 02/10/2015  FINDINGS: There is a total left hip arthroplasty. There is complete dislocation of the left hip with proximal migration of the left femoral arthroplasty above the acetabulum. No definite acute fracture identified.  IMPRESSION: Dislocated left hip arthroplasty with proximal migration.   Electronically Signed   By: Anner Crete M.D.   On: 03/02/2015 19:26   I have personally reviewed and evaluated these images and lab results as part of my medical decision-making.   EKG Interpretation None      MDM   Final diagnoses:  Hip pain  Fall    Entered as procedure note, please see midlevel note for more info of visit.    Deno Etienne, DO 03/03/15 0139

## 2015-03-02 NOTE — ED Notes (Signed)
Bed: WA25 Expected date:  Expected time:  Means of arrival:  Comments: EMS  

## 2015-03-02 NOTE — Telephone Encounter (Signed)
FYI-Pt is being followed by palliative care services not hospice because out of network. [facility] does not have contract with [facility].

## 2015-03-02 NOTE — Telephone Encounter (Signed)
Transferred hospice services to [facility]. [place].

## 2015-03-02 NOTE — ED Notes (Signed)
Patient transported to X-ray 

## 2015-03-02 NOTE — ED Provider Notes (Signed)
CSN: [id number]     Arrival date & time 03/02/15  1739 History   First MD Initiated Contact with Patient 03/02/15 1741     Chief Complaint  Patient presents with  . Hip Pain     HPI   [name] is a 49 y.o. female with a PMH of metastatic non-small cell lung cancer, squamous cell carcinoma s/p chemoradiation who presents to the ED with hip pain. Patient underwent left hip bipolar hemiarthroplasty of displaced femoral neck fracture 02/10/15 with Dr. Ninfa Linden. She states she started taking robaxin last Thursday, and had hallucinations and "weird" dreams. She reports she woke up in the middle of the night Thursday and was found on the floor the next morning. She states she does not remember this event, and is unsure if she hit her head. She reports constant left hip pain since that time. She states movement exacerbates her pain, and she has taken her home pain medication for symptom relief, which has been minimally effective. She denies fever, chills, lightheadedness. She reports dizziness when she tries to stand. She denies chest pain, shortness of breath, abdominal pain. She reports intermittent diarrhea and constipation, and states this is normal for her. She reports numbness and paresthesia to her bilateral lower extremities, which is unchanged from baseline. She reports fatigue, generalized weakness, and difficulty ambulating due to hip pain.   Past Medical History  Diagnosis Date  . Cancer     left bronchioles  . Radiation 11/24/14-12/05/14    Left ribs 30 Gy in 10 fractions   Past Surgical History  Procedure Laterality Date  . Esophagogastroduodenoscopy N/A 09/14/2014    Procedure: ESOPHAGOGASTRODUODENOSCOPY (EGD);  Surgeon: Teena Irani, MD;  Location: [facility];  Service: Endoscopy;  Laterality: N/A;  . Total hip arthroplasty Left 02/10/2015    Procedure:   LEFT HIP BIPOLAR HEMI ARTHROPLASTY;  Surgeon: Mcarthur Rossetti, MD;  Location: [facility];  Service: Orthopedics;   Laterality: Left;   Family History  Problem Relation Age of Onset  . Cancer Mother   . Heart failure Father    Social History  Substance Use Topics  . Smoking status: Former Smoker    Quit date: 09/07/2014  . Smokeless tobacco: None  . Alcohol Use: No   OB History    No data available      Review of Systems  Constitutional: Positive for fatigue. Negative for fever, chills, activity change and appetite change.  Respiratory: Negative for shortness of breath.   Cardiovascular: Negative for chest pain, palpitations and leg swelling.  Gastrointestinal: Positive for diarrhea and constipation. Negative for nausea, vomiting, abdominal pain and abdominal distention.  Genitourinary: Negative for dysuria, urgency and frequency.  Musculoskeletal: Positive for arthralgias and gait problem. Negative for myalgias, back pain, neck pain and neck stiffness.  Skin: Negative for color change, pallor, rash and wound.  Neurological: Positive for dizziness, weakness and numbness. Negative for syncope, light-headedness and headaches.  Psychiatric/Behavioral: Positive for hallucinations and sleep disturbance.  All other systems reviewed and are negative.     Allergies  Penicillins  Home Medications   Prior to Admission medications   Medication Sig Start Date End Date Taking? Authorizing Provider  acetaminophen (TYLENOL) 325 MG tablet Take 2 tablets (650 mg total) by mouth every 6 (six) hours as needed for mild pain (or Fever >/= 101). 09/30/14   Bonnielee Haff, MD  DULoxetine (CYMBALTA) 30 MG capsule Take 30 mg by mouth daily.  12/05/14   Historical Provider, MD  enoxaparin (LOVENOX)  80 MG/0.8ML injection ADMINISTER 0.8 ML UNDER THE SKIN DAILY 01/26/15   Maryanna Shape, NP  fentaNYL (DURAGESIC - DOSED MCG/HR) 100 MCG/HR Place 1 patch (100 mcg total) onto the skin every 3 (three) days. 02/14/15   Costin Karlyne Greenspan, MD  ferrous sulfate 325 (65 FE) MG EC tablet Take 1 tablet (325 mg total) by mouth 2  (two) times daily with a meal. 01/26/15   Maryanna Shape, NP  ipratropium (ATROVENT) 0.02 % nebulizer solution Take 2.5 mLs (0.5 mg total) by nebulization 3 (three) times daily. 11/18/14   Maryanna Shape, NP  lidocaine (LIDODERM) 5 % 1 patch. To left ant chest wall, 12 hours on/12 hours off, Remove & Discard patch within 12 hours or as directed by MD    Historical Provider, MD  LORazepam (ATIVAN) 1 MG tablet Take 1 tablet (1 mg total) by mouth every 6 (six) hours as needed for anxiety. 02/24/15   Estill Dooms, MD  methocarbamol (ROBAXIN) 500 MG tablet Take 500 mg by mouth 3 (three) times daily.    Historical Provider, MD  morphine (MS CONTIN) 15 MG 12 hr tablet Take 1 tablet (15 mg total) by mouth 3 (three) times daily. 02/14/15   Costin Karlyne Greenspan, MD  morphine (MS CONTIN) 30 MG 12 hr tablet Take 1 tablet (30 mg total) by mouth every 8 (eight) hours. 02/09/15   [facility] Adam, PA-C  morphine (MSIR) 15 MG tablet Take 2 tablets (30 mg total) by mouth every 4 (four) hours as needed for severe pain. 02/14/15   Costin Karlyne Greenspan, MD  omeprazole (PRILOSEC) 20 MG capsule Take 1 capsule (20 mg total) by mouth 2 (two) times daily before a meal. 09/30/14   Bonnielee Haff, MD  ondansetron (ZOFRAN) 8 MG tablet TAKE 1 TABLET(8 MG) BY MOUTH EVERY 8 HOURS AS NEEDED FOR NAUSEA OR VOMITING 11/26/14   Maryanna Shape, NP  oxyCODONE (ROXICODONE) 15 MG immediate release tablet Take one to two tablets by mouth every 4 hours as needed for breakthrough pain 02/27/15   Gildardo Cranker, DO  polyethylene glycol [facility] / Floria Raveling) packet Three times daily for constipation 02/19/15   Blanchie Serve, MD  senna-docusate (SENOKOT-S) 8.6-50 MG per tablet Take 1 tablet by mouth at bedtime. Patient taking differently: Take 1 tablet by mouth daily.  09/30/14   Bonnielee Haff, MD    BP 97/64 mmHg  Pulse 85  Temp(Src) 98.5 F (36.9 C) (Oral)  Resp 18  SpO2 96% Physical Exam  Constitutional: She is oriented to person, place, and time. No  distress.  Chronically ill appearing female in no acute distress.  HENT:  Head: Normocephalic and atraumatic.  Right Ear: External ear normal.  Left Ear: External ear normal.  Nose: Nose normal.  Mouth/Throat: Uvula is midline, oropharynx is clear and moist and mucous membranes are normal.  Eyes: Conjunctivae, EOM and lids are normal. Pupils are equal, round, and reactive to light. Right eye exhibits no discharge. Left eye exhibits no discharge. No scleral icterus.  Neck: Normal range of motion. Neck supple.  Cardiovascular: Normal rate, regular rhythm, normal heart sounds, intact distal pulses and normal pulses.   Pulmonary/Chest: Effort normal. No respiratory distress. She has no wheezes. She has no rales.  Abdominal: Soft. Normal appearance and bowel sounds are normal. She exhibits no distension and no mass. There is no tenderness. There is no rigidity, no rebound and no guarding.  Musculoskeletal: She exhibits tenderness. She exhibits no edema.  TTP of  lateral aspect of left hip. Decreased range of motion of LLE due to pain. LLE with mild internal rotation and shortening.  Neurological: She is alert and oriented to person, place, and time. No cranial nerve deficit or sensory deficit.  Decreased strength of LLE due to pain.  Skin: Skin is warm, dry and intact. No rash noted. She is not diaphoretic. No erythema. No pallor.  Psychiatric: She has a normal mood and affect. Her speech is normal and behavior is normal. Judgment and thought content normal.  Nursing note and vitals reviewed.   ED Course  Procedures (including critical care time)  Labs Review Labs Reviewed  CBC WITH DIFFERENTIAL/PLATELET - Abnormal; Notable for the following:    Hemoglobin 11.0 (*)    HCT 34.2 (*)    MCV 77.7 (*)    MCH 25.0 (*)    RDW 18.2 (*)    Platelets 632 (*)    Neutrophils Relative % 82 (*)    Neutro Abs 8.4 (*)    Lymphocytes Relative 4 (*)    Lymphs Abs 0.5 (*)    All other components within  normal limits  BASIC METABOLIC PANEL - Abnormal; Notable for the following:    Sodium 131 (*)    Potassium 3.1 (*)    Chloride 98 (*)    Glucose, Bld 106 (*)    Calcium 8.1 (*)    All other components within normal limits    Imaging Review Dg Knee 1-2 Views Left  03/02/2015   CLINICAL DATA:  Left thigh pain after fall at nursing home 4 days ago.  EXAM: LEFT KNEE - 1-2 VIEW  COMPARISON:  None.  FINDINGS: There is no evidence of fracture, dislocation, or joint effusion. There is no evidence of arthropathy or other focal bone abnormality. Soft tissues are unremarkable.  IMPRESSION: Negative.   Electronically Signed   By: Marin Olp M.D.   On: 03/02/2015 20:15   Ct Head Wo Contrast  03/02/2015   CLINICAL DATA:  Fall on Friday. Dislocation of left hip replacement. Possible head injury at the time of fall. History of lung cancer.  EXAM: CT HEAD WITHOUT CONTRAST  TECHNIQUE: Contiguous axial images were obtained from the base of the skull through the vertex without intravenous contrast.  COMPARISON:  10/22/2014  FINDINGS: The brainstem, cerebellum, cerebral peduncles, thalamus, basal ganglia, basilar cisterns, and ventricular system appear within normal limits. No intracranial hemorrhage, mass lesion, or acute CVA.  IMPRESSION: 1. No significant intracranial abnormality is identified. No current evidence of intracranial metastatic disease on today's noncontrast CT head.   Electronically Signed   By: Van Clines M.D.   On: 03/02/2015 19:56   Dg Hip Unilat With Pelvis Min 4 Views Left  03/02/2015   CLINICAL DATA:  49 year old female with hip pain  EXAM: DG HIP (WITH OR WITHOUT PELVIS) 4+V LEFT  COMPARISON:  Radiograph dated 02/10/2015  FINDINGS: There is a total left hip arthroplasty. There is complete dislocation of the left hip with proximal migration of the left femoral arthroplasty above the acetabulum. No definite acute fracture identified.  IMPRESSION: Dislocated left hip arthroplasty with  proximal migration.   Electronically Signed   By: Anner Crete M.D.   On: 03/02/2015 19:26     I have personally reviewed and evaluated these images and lab results as part of my medical decision-making.   EKG Interpretation None      MDM   Final diagnoses:  Hip pain  Fall  Hip dislocation, left, initial  encounter    49 year old female presents with left hip pain s/p fall last Thursday; states she does not remember falling and is unsure if she hit her head. Denies fever, chills, lightheadedness. Reports dizziness when she tries to stand. Denies chest pain, shortness of breath, abdominal pain.   Patient is afebrile. TTP of left lateral aspect of hip, decreased ROM and strength of LLE due to pain. LLE with mild internal rotation and shortening. Sensation intact. Distal pulses intact.   CBC with anemia, appears stable from baseline. BMP with low potassium, replaced in the ED. Hip x-ray with complete dislocation of left hip with proximal migration of left femoral arthroplasty above the acetabulum. Head CT negative for acute intracranial abnormality, no current evidence of intracranial metastatic disease.  Dilaudid given for pain control.  Ortho consulted given left hip dislocation in the setting of recent surgical fixation. Advised to reduce hip in the ED under conscious sedation. Dr. Tyrone Nine attempted reduction in the ED, but was unable to reduce the patient's left hip. Ortho consulted again. Spoke with [facility] orthopedist, who requested the patient be admitted to [facility]; he will see the patient tomorrow. Patient to be NPO at midnight tonight.  Called to bedside due to patient's family member being upset given patient's significant pain and pending transfer. Discussed plan at length with patient's family member, who continued to express frustration that we "yanked on her leg." Additional dose of dilaudid given, which was not effective in controlling the patient's pain. Fentanyl given for  pain control.  Patient's pain still not controlled. Additional dose of fentanyl given.  Patient reports no improvement in pain control. 1 mg dilaudid given.  Patient to be transported to [facility].   BP 97/64 mmHg  Pulse 85  Temp(Src) 98.5 F (36.9 C) (Oral)  Resp 18  SpO2 96%   Marella Chimes, PA-C 03/03/15 [place], DO 03/03/15 0148

## 2015-03-03 ENCOUNTER — Inpatient Hospital Stay (HOSPITAL_COMMUNITY): Payer: 59 | Admitting: Anesthesiology

## 2015-03-03 ENCOUNTER — Inpatient Hospital Stay (HOSPITAL_COMMUNITY): Payer: 59

## 2015-03-03 ENCOUNTER — Encounter (HOSPITAL_COMMUNITY): Payer: Self-pay | Admitting: Anesthesiology

## 2015-03-03 ENCOUNTER — Encounter (HOSPITAL_COMMUNITY)
Admission: EM | Disposition: A | Discharge status: Skilled Nursing Facility | Payer: Self-pay | Source: Home / Self Care | Attending: Orthopaedic Surgery

## 2015-03-03 HISTORY — PX: HIP CLOSED REDUCTION: SHX983

## 2015-03-03 SURGERY — CLOSED MANIPULATION, JOINT, HIP
Anesthesia: General | Site: Hip | Laterality: Left

## 2015-03-03 MED ORDER — SUGAMMADEX SODIUM 200 MG/2ML IV SOLN
INTRAVENOUS | Status: DC | PRN
  Administered 2015-03-03: 100 mg via INTRAVENOUS

## 2015-03-03 MED ORDER — SUGAMMADEX SODIUM 200 MG/2ML IV SOLN
INTRAVENOUS | Status: AC
Start: 2015-03-03 — End: 2015-03-03
  Filled 2015-03-03: qty 2

## 2015-03-03 MED ORDER — PHENYLEPHRINE HCL 10 MG/ML IJ SOLN
INTRAMUSCULAR | Status: DC | PRN
  Administered 2015-03-03 (×2): 80 ug via INTRAVENOUS
  Administered 2015-03-03: 40 ug via INTRAVENOUS

## 2015-03-03 MED ORDER — VANCOMYCIN HCL IN DEXTROSE 1-5 GM/200ML-% IV SOLN
INTRAVENOUS | Status: AC
  Administered 2015-03-03: 1000 mg via INTRAVENOUS
  Filled 2015-03-03: qty 200

## 2015-03-03 MED ORDER — ONDANSETRON HCL 4 MG/2ML IJ SOLN: 4.0000 mg | Freq: Once | INTRAMUSCULAR | Status: DC | PRN

## 2015-03-03 MED ORDER — KETOROLAC TROMETHAMINE 15 MG/ML IJ SOLN
15.0000 mg | Freq: Four times a day (QID) | INTRAMUSCULAR | Status: AC | PRN
  Administered 2015-03-03 – 2015-03-05 (×4): 15 mg via INTRAVENOUS
  Filled 2015-03-03 (×4): qty 1

## 2015-03-03 MED ORDER — LACTATED RINGERS IV SOLN
INTRAVENOUS | Status: DC
  Administered 2015-03-03: 15:00:00 via INTRAVENOUS

## 2015-03-03 MED ORDER — ROCURONIUM BROMIDE 100 MG/10ML IV SOLN
INTRAVENOUS | Status: DC | PRN
  Administered 2015-03-03: 10 mg via INTRAVENOUS
  Administered 2015-03-03: 30 mg via INTRAVENOUS
  Administered 2015-03-03: 10 mg via INTRAVENOUS

## 2015-03-03 MED ORDER — MIDAZOLAM HCL 2 MG/2ML IJ SOLN
INTRAMUSCULAR | Status: AC
  Filled 2015-03-03: qty 4

## 2015-03-03 MED ORDER — PROPOFOL 10 MG/ML IV BOLUS
INTRAVENOUS | Status: DC | PRN
  Administered 2015-03-03 (×2): 50 mg via INTRAVENOUS

## 2015-03-03 MED ORDER — FENTANYL CITRATE (PF) 100 MCG/2ML IJ SOLN
25.0000 ug | INTRAMUSCULAR | Status: DC | PRN
Start: 2015-03-03 — End: 2015-03-03
  Administered 2015-03-03 (×4): 25 ug via INTRAVENOUS

## 2015-03-03 MED ORDER — FENTANYL CITRATE (PF) 100 MCG/2ML IJ SOLN
INTRAMUSCULAR | Status: AC
  Filled 2015-03-03: qty 2

## 2015-03-03 MED ORDER — LACTATED RINGERS IV SOLN
INTRAVENOUS | Status: DC | PRN
  Administered 2015-03-03: 16:00:00 via INTRAVENOUS

## 2015-03-03 MED ORDER — FENTANYL CITRATE (PF) 100 MCG/2ML IJ SOLN
INTRAMUSCULAR | Status: DC | PRN
Start: 2015-03-03 — End: 2015-03-03
  Administered 2015-03-03 (×5): 50 ug via INTRAVENOUS

## 2015-03-03 MED ORDER — FENTANYL CITRATE (PF) 100 MCG/2ML IJ SOLN
100.0000 ug | Freq: Once | INTRAMUSCULAR | Status: AC
  Administered 2015-03-03: 100 ug via INTRAVENOUS
  Filled 2015-03-03: qty 2

## 2015-03-03 MED ORDER — FENTANYL CITRATE (PF) 250 MCG/5ML IJ SOLN
INTRAMUSCULAR | Status: AC
  Filled 2015-03-03: qty 5

## 2015-03-03 MED ORDER — MIDAZOLAM HCL 5 MG/5ML IJ SOLN
INTRAMUSCULAR | Status: DC | PRN
  Administered 2015-03-03 (×2): 1 mg via INTRAVENOUS

## 2015-03-03 MED ORDER — ONDANSETRON HCL 4 MG/2ML IJ SOLN
INTRAMUSCULAR | Status: DC | PRN
  Administered 2015-03-03: 4 mg via INTRAVENOUS

## 2015-03-03 MED ORDER — PROPOFOL 10 MG/ML IV BOLUS
INTRAVENOUS | Status: AC
  Filled 2015-03-03: qty 20

## 2015-03-03 MED ORDER — HYDROMORPHONE HCL 1 MG/ML IJ SOLN
1.0000 mg | Freq: Once | INTRAMUSCULAR | Status: AC
  Administered 2015-03-03: 1 mg via INTRAVENOUS
  Filled 2015-03-03: qty 1

## 2015-03-03 MED ORDER — ONDANSETRON HCL 4 MG/2ML IJ SOLN
INTRAMUSCULAR | Status: AC
  Filled 2015-03-03: qty 2

## 2015-03-03 MED ORDER — MORPHINE SULFATE (PF) 2 MG/ML IV SOLN
1.0000 mg | INTRAVENOUS | Status: DC | PRN
  Administered 2015-03-03 (×3): 2 mg via INTRAVENOUS
  Filled 2015-03-03 (×3): qty 1

## 2015-03-03 MED ORDER — LIDOCAINE HCL (CARDIAC) 20 MG/ML IV SOLN
INTRAVENOUS | Status: DC | PRN
  Administered 2015-03-03: 50 mg via INTRAVENOUS

## 2015-03-03 MED ORDER — 0.9 % SODIUM CHLORIDE (POUR BTL) OPTIME
TOPICAL | Status: DC | PRN
  Administered 2015-03-03: 1000 mL

## 2015-03-03 MED ORDER — HYDROMORPHONE HCL 1 MG/ML IJ SOLN
1.0000 mg | INTRAMUSCULAR | Status: DC | PRN
  Administered 2015-03-03: 1 mg via INTRAVENOUS
  Administered 2015-03-03: 2 mg via INTRAVENOUS
  Administered 2015-03-03 – 2015-03-04 (×5): 1 mg via INTRAVENOUS
  Administered 2015-03-04: 2 mg via INTRAVENOUS
  Administered 2015-03-04: 1 mg via INTRAVENOUS
  Administered 2015-03-04 (×2): 2 mg via INTRAVENOUS
  Administered 2015-03-05 (×2): 1 mg via INTRAVENOUS
  Administered 2015-03-05: 2 mg via INTRAVENOUS
  Filled 2015-03-03: qty 2
  Filled 2015-03-03 (×2): qty 1
  Filled 2015-03-03: qty 2
  Filled 2015-03-03: qty 1
  Filled 2015-03-03: qty 2
  Filled 2015-03-03: qty 1
  Filled 2015-03-03: qty 2
  Filled 2015-03-03: qty 1
  Filled 2015-03-03 (×2): qty 2
  Filled 2015-03-03: qty 1
  Filled 2015-03-03: qty 2
  Filled 2015-03-03: qty 1

## 2015-03-03 MED ORDER — SODIUM CHLORIDE 0.9 % IV SOLN
INTRAVENOUS | Status: DC
  Administered 2015-03-03 (×2): via INTRAVENOUS

## 2015-03-03 SURGICAL SUPPLY — 53 items
BENZOIN TINCTURE PRP APPL 2/3 (GAUZE/BANDAGES/DRESSINGS) IMPLANT
BLADE SAW SGTL 18X1.27X75 (BLADE) IMPLANT
BLADE SAW SGTL 18X1.27X75MM (BLADE)
BLADE SURG ROTATE 9660 (MISCELLANEOUS) IMPLANT
CELLS DAT CNTRL 66122 CELL SVR (MISCELLANEOUS) IMPLANT
CLOSURE WOUND 1/2 X4 (GAUZE/BANDAGES/DRESSINGS)
COVER SURGICAL LIGHT HANDLE (MISCELLANEOUS) ×8 IMPLANT
DRAPE C-ARM 42X72 X-RAY (DRAPES) IMPLANT
DRAPE STERI IOBAN 125X83 (DRAPES) IMPLANT
DRAPE U-SHAPE 47X51 STRL (DRAPES) IMPLANT
DRSG AQUACEL AG ADV 3.5X10 (GAUZE/BANDAGES/DRESSINGS) IMPLANT
DRSG MEPILEX BORDER 4X8 (GAUZE/BANDAGES/DRESSINGS) ×4 IMPLANT
DURAPREP 26ML APPLICATOR (WOUND CARE) IMPLANT
ELECT BLADE 4.0 EZ CLEAN MEGAD (MISCELLANEOUS)
ELECT BLADE 6.5 EXT (BLADE) IMPLANT
ELECT REM PT RETURN 9FT ADLT (ELECTROSURGICAL)
ELECTRODE BLDE 4.0 EZ CLN MEGD (MISCELLANEOUS) IMPLANT
ELECTRODE REM PT RTRN 9FT ADLT (ELECTROSURGICAL) IMPLANT
FACESHIELD WRAPAROUND (MASK) IMPLANT
GLOVE BIO SURGEON STRL SZ8 (GLOVE) ×4 IMPLANT
GLOVE BIOGEL PI IND STRL 8 (GLOVE) IMPLANT
GLOVE BIOGEL PI INDICATOR 8 (GLOVE)
GLOVE ECLIPSE 8.0 STRL XLNG CF (GLOVE) IMPLANT
GLOVE ORTHO TXT STRL SZ7.5 (GLOVE) IMPLANT
GOWN STRL REUS W/ TWL LRG LVL3 (GOWN DISPOSABLE) IMPLANT
GOWN STRL REUS W/ TWL XL LVL3 (GOWN DISPOSABLE) IMPLANT
GOWN STRL REUS W/TWL LRG LVL3 (GOWN DISPOSABLE)
GOWN STRL REUS W/TWL XL LVL3 (GOWN DISPOSABLE)
HANDPIECE INTERPULSE COAX TIP (DISPOSABLE)
KIT BASIN OR (CUSTOM PROCEDURE TRAY) IMPLANT
KIT ROOM TURNOVER OR (KITS) ×4 IMPLANT
MANIFOLD NEPTUNE II (INSTRUMENTS) IMPLANT
NS IRRIG 1000ML POUR BTL (IV SOLUTION) ×4 IMPLANT
PACK TOTAL JOINT (CUSTOM PROCEDURE TRAY) IMPLANT
PACK UNIVERSAL I (CUSTOM PROCEDURE TRAY) IMPLANT
PAD ARMBOARD 7.5X6 YLW CONV (MISCELLANEOUS) ×8 IMPLANT
RTRCTR WOUND ALEXIS 18CM MED (MISCELLANEOUS)
SET HNDPC FAN SPRY TIP SCT (DISPOSABLE) IMPLANT
STAPLER VISISTAT 35W (STAPLE) IMPLANT
STRIP CLOSURE SKIN 1/2X4 (GAUZE/BANDAGES/DRESSINGS) IMPLANT
SUT ETHIBOND NAB CT1 #1 30IN (SUTURE) IMPLANT
SUT ETHILON 2 0 FS 18 (SUTURE) ×4 IMPLANT
SUT MNCRL AB 4-0 PS2 18 (SUTURE) IMPLANT
SUT VIC AB 0 CT1 27 (SUTURE)
SUT VIC AB 0 CT1 27XBRD ANBCTR (SUTURE) IMPLANT
SUT VIC AB 1 CT1 27 (SUTURE)
SUT VIC AB 1 CT1 27XBRD ANBCTR (SUTURE) IMPLANT
SUT VIC AB 2-0 CT1 27 (SUTURE)
SUT VIC AB 2-0 CT1 TAPERPNT 27 (SUTURE) IMPLANT
TOWEL OR 17X24 6PK STRL BLUE (TOWEL DISPOSABLE) IMPLANT
TOWEL OR 17X26 10 PK STRL BLUE (TOWEL DISPOSABLE) IMPLANT
TRAY FOLEY CATH 16FRSI W/METER (SET/KITS/TRAYS/PACK) IMPLANT
WATER STERILE IRR 1000ML POUR (IV SOLUTION) ×4 IMPLANT

## 2015-03-03 NOTE — Anesthesia Procedure Notes (Signed)
Procedure Name: Intubation Date/Time: 03/03/2015 3:53 PM Performed by: Susa Loffler Pre-anesthesia Checklist: Patient identified, Timeout performed, Emergency Drugs available, Suction available and Patient being monitored Patient Re-evaluated:Patient Re-evaluated prior to inductionOxygen Delivery Method: Circle system utilized Preoxygenation: Pre-oxygenation with 100% oxygen Intubation Type: IV induction Ventilation: Mask ventilation without difficulty Laryngoscope Size: Mac and 3 Grade View: Grade I Tube type: Oral Tube size: 7.0 mm Number of attempts: 1 Airway Equipment and Method: Stylet Placement Confirmation: ETT inserted through vocal cords under direct vision,  positive ETCO2 and breath sounds checked- equal and bilateral Secured at: 21 cm Tube secured with: Tape Dental Injury: Teeth and Oropharynx as per pre-operative assessment

## 2015-03-03 NOTE — ED Notes (Signed)
Gave report to Jackelyn Poling, RN with [facility].

## 2015-03-03 NOTE — Progress Notes (Addendum)
Received patient from [facility] ED via [place]. Patient AOx4, VS stable and pain 9/10. Patient oriented to room, bed controls and call light.  Brother at bedside. Paged admitting Ortho MD of patient's arrival to floor.  On call Ortho MD notified and called back for new orders.

## 2015-03-03 NOTE — H&P (Signed)
[name] is an 49 y.o. female.   Chief Complaint: Dislocated left hip hemiarthroplasty HPI: Patient is a 49 year old woman who is status post left hip hemiarthroplasty on 02/10/2015. Patient was recently seen in the office and had no complicating features. Last night patient fell out of bed sustaining a dislocation to the left hip. Emergency room physicians attempted reduction and were unsuccessful last night. Patient is admitted this time for closed versus open reduction of the left hip hemiarthroplasty.  Past Medical History  Diagnosis Date  . Cancer     left bronchioles  . Radiation 11/24/14-12/05/14    Left ribs 30 Gy in 10 fractions    Past Surgical History  Procedure Laterality Date  . Esophagogastroduodenoscopy N/A 09/14/2014    Procedure: ESOPHAGOGASTRODUODENOSCOPY (EGD);  Surgeon: Teena Irani, MD;  Location: [facility];  Service: Endoscopy;  Laterality: N/A;  . Total hip arthroplasty Left 02/10/2015    Procedure:   LEFT HIP BIPOLAR HEMI ARTHROPLASTY;  Surgeon: Mcarthur Rossetti, MD;  Location: [facility];  Service: Orthopedics;  Laterality: Left;    Family History  Problem Relation Age of Onset  . Cancer Mother   . Heart failure Father    Social History:  reports that she quit smoking about 5 months ago. She does not have any smokeless tobacco history on file. She reports that she does not drink alcohol or use illicit drugs.  Allergies:  Allergies  Allergen Reactions  . Penicillins Rash    On MAR unable to answer questions    Medications Prior to Admission  Medication Sig Dispense Refill  . acetaminophen (TYLENOL) 325 MG tablet Take 2 tablets (650 mg total) by mouth every 6 (six) hours as needed for mild pain (or Fever >/= 101).    . DULoxetine (CYMBALTA) 30 MG capsule Take 30 mg by mouth daily.     Marland Kitchen enoxaparin (LOVENOX) 80 MG/0.8ML injection ADMINISTER 0.8 ML UNDER THE SKIN DAILY 24 mL 0  . fentaNYL (DURAGESIC - DOSED MCG/HR) 100 MCG/HR Place 1 patch (100  mcg total) onto the skin every 3 (three) days. 5 patch 0  . ferrous sulfate 325 (65 FE) MG EC tablet Take 1 tablet (325 mg total) by mouth 2 (two) times daily with a meal. 60 tablet 1  . ipratropium (ATROVENT) 0.02 % nebulizer solution Take 2.5 mLs (0.5 mg total) by nebulization 3 (three) times daily. 75 mL 0  . lidocaine (LIDODERM) 5 % 1 patch. To left ant chest wall, 12 hours on/12 hours off, Remove & Discard patch within 12 hours or as directed by MD    . LORazepam (ATIVAN) 1 MG tablet Take 1 tablet (1 mg total) by mouth every 6 (six) hours as needed for anxiety. 120 tablet 5  . morphine (MS CONTIN) 15 MG 12 hr tablet Take 1 tablet (15 mg total) by mouth 3 (three) times daily. 60 tablet 0  . morphine (MS CONTIN) 30 MG 12 hr tablet Take 1 tablet (30 mg total) by mouth every 8 (eight) hours. 90 tablet 0  . morphine (MSIR) 15 MG tablet Take 2 tablets (30 mg total) by mouth every 4 (four) hours as needed for severe pain. 60 tablet 0  . omeprazole (PRILOSEC) 20 MG capsule Take 1 capsule (20 mg total) by mouth 2 (two) times daily before a meal. 60 capsule 1  . ondansetron (ZOFRAN) 8 MG tablet TAKE 1 TABLET(8 MG) BY MOUTH EVERY 8 HOURS AS NEEDED FOR NAUSEA OR VOMITING 20 tablet 0  . polyethylene  glycol (MIRALAX / GLYCOLAX) packet Give 6-8 oz Three times daily for constipation, if BM are more frequent change to BID    . senna-docusate (SENOKOT-S) 8.6-50 MG per tablet Take 1 tablet by mouth at bedtime. 30 tablet 2  . oxyCODONE (ROXICODONE) 15 MG immediate release tablet Take one to two tablets by mouth every 4 hours as needed for breakthrough pain (Patient not taking: Reported on 03/02/2015) 360 tablet 0    Results for orders placed or performed during the hospital encounter of 03/02/15 (from the past 48 hour(s))  CBC with Differential     Status: Abnormal   Collection Time: 03/02/15  6:25 PM  Result Value Ref Range   WBC 10.3 4.0 - 10.5 K/uL   RBC 4.40 3.87 - 5.11 MIL/uL   Hemoglobin 11.0 (L) 12.0 -  15.0 g/dL   HCT 34.2 (L) 36.0 - 46.0 %   MCV 77.7 (L) 78.0 - 100.0 fL   MCH 25.0 (L) 26.0 - 34.0 pg   MCHC 32.2 30.0 - 36.0 g/dL   RDW 18.2 (H) 11.5 - 15.5 %   Platelets 632 (H) 150 - 400 K/uL   Neutrophils Relative % 82 (H) 43 - 77 %   Neutro Abs 8.4 (H) 1.7 - 7.7 K/uL   Lymphocytes Relative 4 (L) 12 - 46 %   Lymphs Abs 0.5 (L) 0.7 - 4.0 K/uL   Monocytes Relative 10 3 - 12 %   Monocytes Absolute 1.0 0.1 - 1.0 K/uL   Eosinophils Relative 4 0 - 5 %   Eosinophils Absolute 0.4 0.0 - 0.7 K/uL   Basophils Relative 0 0 - 1 %   Basophils Absolute 0.0 0.0 - 0.1 K/uL  Basic metabolic panel     Status: Abnormal   Collection Time: 03/02/15  6:25 PM  Result Value Ref Range   Sodium 131 (L) 135 - 145 mmol/L   Potassium 3.1 (L) 3.5 - 5.1 mmol/L   Chloride 98 (L) 101 - 111 mmol/L   CO2 26 22 - 32 mmol/L   Glucose, Bld 106 (H) 65 - 99 mg/dL   BUN 15 6 - 20 mg/dL   Creatinine, Ser 0.50 0.44 - 1.00 mg/dL   Calcium 8.1 (L) 8.9 - 10.3 mg/dL   GFR calc non Af Amer >60 >60 mL/min   GFR calc Af Amer >60 >60 mL/min    Comment: (NOTE) The eGFR has been calculated using the CKD EPI equation. This calculation has not been validated in all clinical situations. eGFR's persistently <60 mL/min signify possible Chronic Kidney Disease.    Anion gap 7 5 - 15   Dg Knee 1-2 Views Left  03/02/2015   CLINICAL DATA:  Left thigh pain after fall at nursing home 4 days ago.  EXAM: LEFT KNEE - 1-2 VIEW  COMPARISON:  None.  FINDINGS: There is no evidence of fracture, dislocation, or joint effusion. There is no evidence of arthropathy or other focal bone abnormality. Soft tissues are unremarkable.  IMPRESSION: Negative.   Electronically Signed   By: Marin Olp M.D.   On: 03/02/2015 20:15   Ct Head Wo Contrast  03/02/2015   CLINICAL DATA:  Fall on Friday. Dislocation of left hip replacement. Possible head injury at the time of fall. History of lung cancer.  EXAM: CT HEAD WITHOUT CONTRAST  TECHNIQUE: Contiguous axial  images were obtained from the base of the skull through the vertex without intravenous contrast.  COMPARISON:  10/22/2014  FINDINGS: The brainstem, cerebellum, cerebral peduncles, thalamus,  basal ganglia, basilar cisterns, and ventricular system appear within normal limits. No intracranial hemorrhage, mass lesion, or acute CVA.  IMPRESSION: 1. No significant intracranial abnormality is identified. No current evidence of intracranial metastatic disease on today's noncontrast CT head.   Electronically Signed   By: Van Clines M.D.   On: 03/02/2015 19:56   Dg Hip Unilat With Pelvis Min 4 Views Left  03/02/2015   CLINICAL DATA:  49 year old female with hip pain  EXAM: DG HIP (WITH OR WITHOUT PELVIS) 4+V LEFT  COMPARISON:  Radiograph dated 02/10/2015  FINDINGS: There is a total left hip arthroplasty. There is complete dislocation of the left hip with proximal migration of the left femoral arthroplasty above the acetabulum. No definite acute fracture identified.  IMPRESSION: Dislocated left hip arthroplasty with proximal migration.   Electronically Signed   By: Anner Crete M.D.   On: 03/02/2015 19:26    Review of Systems  All other systems reviewed and are negative.   Blood pressure 99/53, pulse 86, temperature 98 F (36.7 C), temperature source Oral, resp. rate 18, height _0  (1.702 m), weight 53.1 kg (117 lb 1 oz), SpO2 95 %. Physical Exam  On examination patient's left lower extremity is shortened and internally rotated. There are no skin abrasions or breakdown. There is no drainage. Radiographs shows a superior dislocated left hip hemiarthroplasty with no evidence of fracture. Assessment/Plan Assessment: Dislocated left hip hemiarthroplasty.  Plan: I will consult Dr. Ninfa Linden for evaluation for closed versus open reduction of the left hip hemiarthroplasty dislocation. Patient is nothing by mouth at this time. Patient underwent an anterior approach for the surgery.  Saeed [name]  V 03/03/2015, 6:44 AM

## 2015-03-03 NOTE — Anesthesia Preprocedure Evaluation (Addendum)
Anesthesia Evaluation  Patient identified by MRN, date of birth, ID band Patient awake    Reviewed: Allergy & Precautions, NPO status , Patient's Chart, lab work & pertinent test results  History of Anesthesia Complications Negative for: history of anesthetic complications  Airway Mallampati: II  TM Distance: >3 FB Neck ROM: Full    Dental  (+) Dental Advisory Given, Poor Dentition   Pulmonary former smoker,  Lung cancer with extension to chest wall: XRT   breath sounds clear to auscultation       Cardiovascular negative cardio ROS   Rhythm:Regular Rate:Normal  3/16 ECHO: EF 60%, valves OK   Neuro/Psych    GI/Hepatic negative GI ROS, Elevated LFTs   Endo/Other  negative endocrine ROS  Renal/GU negative Renal ROS     Musculoskeletal Dislocated THR   Abdominal   Peds  Hematology negative hematology ROS (+)   Anesthesia Other Findings   Reproductive/Obstetrics                            Anesthesia Physical Anesthesia Plan  ASA: IV  Anesthesia Plan: General   Post-op Pain Management:    Induction: Intravenous  Airway Management Planned: Oral ETT  Additional Equipment:   Intra-op Plan:   Post-operative Plan: Extubation in OR  Informed Consent: I have reviewed the patients History and Physical, chart, labs and discussed the procedure including the risks, benefits and alternatives for the proposed anesthesia with the patient or authorized representative who has indicated his/her understanding and acceptance.   Dental advisory given  Plan Discussed with: CRNA and Surgeon  Anesthesia Plan Comments: (Plan routine monitors, GETA)        Anesthesia Quick Evaluation

## 2015-03-03 NOTE — Progress Notes (Signed)
Utilization review completed. Ewell Benassi, RN, [name]. 

## 2015-03-03 NOTE — Progress Notes (Signed)
Patient ID: [name], female   DOB: [birth year], 49 y.o.   MRN: [id number] I have spoken to the patient and her husband.  She actually fell this past Friday and her left hip has likely been dislocated since then.  This is quite a difficult situation.  We will need to proceed to the OR today (this afternoon) for a closed vs open reduction under general anesthesia.  Thee is a high likelihood that an open reduction is needed given her hip position and that this is a bipolar hemiarthroplasty.  Again, this likely occurred several days ago while she has been at a skilled nursing facility.  The risks and benefits of surgery have been discussed in detail.

## 2015-03-03 NOTE — Progress Notes (Signed)
Attempted to call report on patient going to the OR. Told by staff they would call me back.

## 2015-03-03 NOTE — Transfer of Care (Signed)
Immediate Anesthesia Transfer of Care Note  Patient: [name]  Procedure(s) Performed: Procedure(s): CLOSED MANIPULATION HIP (Left) TOTAL HIP ARTHROPLASTY ANTERIOR APPROACH (Left)  Patient Location: PACU  Anesthesia Type:General  Level of Consciousness: awake, alert  and oriented  Airway & Oxygen Therapy: Patient Spontanous Breathing and Patient connected to nasal cannula oxygen  Post-op Assessment: Report given to RN and Post -op Vital signs reviewed and stable  Post vital signs: Reviewed and stable  Last Vitals:  Filed Vitals:   03/03/15 1415  BP: 123/80  Pulse: 81  Temp: 36.8 C  Resp: 18    Complications: No apparent anesthesia complications

## 2015-03-03 NOTE — Anesthesia Postprocedure Evaluation (Signed)
  Anesthesia Post-op Note  Patient: [name]  Procedure(s) Performed: Procedure(s) (LRB): CLOSED MANIPULATION HIP (Left)  Patient Location: PACU  Anesthesia Type: General  Level of Consciousness: awake and alert   Airway and Oxygen Therapy: Patient Spontanous Breathing  Post-op Pain: mild  Post-op Assessment: Post-op Vital signs reviewed, Patient's Cardiovascular Status Stable, Respiratory Function Stable, Patent Airway and No signs of Nausea or vomiting  Last Vitals:  Filed Vitals:   03/03/15 1821  BP: 124/81  Pulse: 87  Temp: 36.8 C  Resp: 23    Post-op Vital Signs: stable   Complications: No apparent anesthesia complications

## 2015-03-03 NOTE — Brief Op Note (Signed)
03/02/2015 - 03/03/2015  4:45 PM  PATIENT:  [name]  49 y.o. female  PRE-OPERATIVE DIAGNOSIS:  Left Hip dislocation of hemiarthroplasty  POST-OPERATIVE DIAGNOSIS:  * No post-op diagnosis entered *  PROCEDURE:  Closed reduction of left hip dislocation  SURGEON:  Surgeon(s) and Role:    * Mcarthur Rossetti, MD - Primary  PHYSICIAN ASSISTANT: Benita Stabile, PA-C  ANESTHESIA:   general  EBL:  Total I/O In: 600 [I.V.:600] Out: 750 [Urine:750]  BLOOD ADMINISTERED:none  DRAINS: none   LOCAL MEDICATIONS USED:  NONE  SPECIMEN:  No Specimen  DISPOSITION OF SPECIMEN:  N/A  COUNTS:  YES  TOURNIQUET:  * No tourniquets in log *  DICTATION: .Other Dictation: Dictation Number [id number]  PLAN OF CARE: Admit to inpatient   PATIENT DISPOSITION:  PACU - hemodynamically stable.   Delay start of Pharmacological VTE agent (>24hrs) due to surgical blood loss or risk of bleeding: no

## 2015-03-04 ENCOUNTER — Encounter (HOSPITAL_COMMUNITY): Payer: Self-pay | Admitting: Orthopaedic Surgery

## 2015-03-04 MED ORDER — PANTOPRAZOLE SODIUM 40 MG PO TBEC
40.0000 mg | DELAYED_RELEASE_TABLET | Freq: Every day | ORAL | Status: DC
  Administered 2015-03-04 – 2015-03-05 (×2): 40 mg via ORAL
  Filled 2015-03-04 (×2): qty 1

## 2015-03-04 MED ORDER — FERROUS SULFATE 325 (65 FE) MG PO TABS
325.0000 mg | ORAL_TABLET | Freq: Two times a day (BID) | ORAL | Status: DC
  Administered 2015-03-04 – 2015-03-05 (×3): 325 mg via ORAL
  Filled 2015-03-04 (×3): qty 1

## 2015-03-04 MED ORDER — IPRATROPIUM BROMIDE 0.02 % IN SOLN: 0.2500 mg | Freq: Three times a day (TID) | RESPIRATORY_TRACT | Status: DC

## 2015-03-04 MED ORDER — ONDANSETRON HCL 4 MG PO TABS: 8.0000 mg | ORAL_TABLET | Freq: Three times a day (TID) | ORAL | Status: DC | PRN

## 2015-03-04 MED ORDER — IPRATROPIUM BROMIDE 0.02 % IN SOLN
2.5000 mL | RESPIRATORY_TRACT | Status: DC
  Administered 2015-03-04: 0.5 mg via RESPIRATORY_TRACT
  Filled 2015-03-04: qty 2.5

## 2015-03-04 MED ORDER — OXYCODONE HCL 5 MG PO TABS
15.0000 mg | ORAL_TABLET | ORAL | Status: DC | PRN
  Administered 2015-03-05 (×2): 15 mg via ORAL
  Filled 2015-03-04 (×2): qty 3

## 2015-03-04 MED ORDER — LORAZEPAM 1 MG PO TABS
1.0000 mg | ORAL_TABLET | Freq: Four times a day (QID) | ORAL | Status: DC | PRN
Start: 2015-03-04 — End: 2015-03-05

## 2015-03-04 MED ORDER — FENTANYL 50 MCG/HR TD PT72
100.0000 ug | MEDICATED_PATCH | TRANSDERMAL | Status: DC
  Administered 2015-03-04: 100 ug via TRANSDERMAL
  Filled 2015-03-04: qty 2

## 2015-03-04 MED ORDER — DULOXETINE HCL 30 MG PO CPEP
30.0000 mg | ORAL_CAPSULE | Freq: Every day | ORAL | Status: DC
  Administered 2015-03-04 – 2015-03-05 (×2): 30 mg via ORAL
  Filled 2015-03-04 (×2): qty 1

## 2015-03-04 MED ORDER — SENNA 8.6 MG PO TABS
1.0000 | ORAL_TABLET | Freq: Every day | ORAL | Status: DC
Start: 2015-03-04 — End: 2015-03-05
  Filled 2015-03-04 (×2): qty 1

## 2015-03-04 MED ORDER — ENOXAPARIN SODIUM 80 MG/0.8ML ~~LOC~~ SOLN
1.5000 mg/kg | SUBCUTANEOUS | Status: DC
  Administered 2015-03-04 – 2015-03-05 (×2): 80 mg via SUBCUTANEOUS
  Filled 2015-03-04 (×2): qty 0.8

## 2015-03-04 MED ORDER — MORPHINE SULFATE ER 30 MG PO TBCR
30.0000 mg | EXTENDED_RELEASE_TABLET | Freq: Three times a day (TID) | ORAL | Status: DC
  Administered 2015-03-04 – 2015-03-05 (×4): 30 mg via ORAL
  Filled 2015-03-04 (×4): qty 1

## 2015-03-04 MED ORDER — IPRATROPIUM BROMIDE 0.02 % IN SOLN
2.5000 mL | Freq: Three times a day (TID) | RESPIRATORY_TRACT | Status: DC
  Administered 2015-03-05 (×2): 0.5 mg via RESPIRATORY_TRACT
  Filled 2015-03-04 (×4): qty 2.5

## 2015-03-04 NOTE — Progress Notes (Signed)
PT Cancellation Note  Patient Details Name: [name] MRN: [id number] DOB: May 09, 1966   Cancelled Treatment:    Reason Eval/Treat Not Completed: Fatigue/lethargy limiting ability to participate Pt recently received pain medication but is difficult to arouse. Will follow-up this afternoon to attempt PT evaluation.  Ellouise Newer 03/04/2015, 2:14 PM Camille Bal [place], [place]

## 2015-03-04 NOTE — Clinical Social Work Note (Addendum)
CSW received consult for patient from SNF, patient is from [facility].  Formal assessment to follow, FL2 on chart awaiting signature for physician.  PT has been ordered by physician awaiting PT evaluation to begin insurance approval for SNF.  Jones Broom. [place], MSW, [place] 03/04/2015 6:28 PM

## 2015-03-04 NOTE — Progress Notes (Signed)
Patient ID: [name], female   DOB: [birth year], 49 y.o.   MRN: [id number] Left hip hemiarthroplasty is in place (reduced).  Can be up with therapy mainly just to get to wheelchair.  She can put full weight on her left hip.  Posterior hip precautions.  Will keep in knee immobilizer for a while.  Needs social work consult for discharge planning.  Brother wants her to go to hospice-type of facility.

## 2015-03-04 NOTE — Clinical Social Work Note (Addendum)
Clinical Social Work Assessment  Patient Details  Name: [name] MRN: [id number] Date of Birth: 1965-09-16  Date of referral:  03/04/15               Reason for consult:  Facility Placement                Permission sought to share information with:  Facility Art therapist granted to share information::  Yes, Verbal Permission Granted  Name::      Howell Rucks patient's brother  Agency::   SNF admissions  Relationship::     Contact Information:     Housing/Transportation Living arrangements for the past 2 months:  Single Family Home Source of Information:  Patient Patient Interpreter Needed:  None Criminal Activity/Legal Involvement Pertinent to Current Situation/Hospitalization:  No - Comment as needed Significant Relationships:  Siblings Lives with:  Self Do you feel safe going back to the place where you live?  Yes Need for family participation in patient care:  No (Coment)  Care giving concerns:  Patient was at [facility] and she did not have any issues.   Social Worker assessment / plan:  Patient was at [facility], plan is to return once she is medically ready.  Patient did not really want to speak with CSW very much but she requested to have CSW speak to her brother.  Patient did not express any concerns or issues about her care that was provided at [facility].  Employment status:  Unemployed Forensic scientist:  Managed Care PT Recommendations:  [place] / Referral to community resources:  [place]  Patient/Family's Response to care:  Patient would like to return back to [employer]  Patient/Family's Understanding of and Emotional Response to Diagnosis, Current Treatment, and Prognosis:  Patient and family are aware of patient's prognosis and the current treatment plan.  Emotional Assessment Appearance:  Appears older than stated age Attitude/Demeanor/Rapport:    Affect  (typically observed):  Calm, Pleasant Orientation:  Oriented to Place, Oriented to  Time, Oriented to Situation, Oriented to Self Alcohol / Substance use:  Not Applicable Psych involvement (Current and /or in the community):  No (Comment)  Discharge Needs  Concerns to be addressed:  Discharge Planning Concerns Readmission within the last 30 days:  Yes 02/14/15 to [facility] SN[facility] Current discharge risk:  None Barriers to Discharge:  No Barriers Identified   Ross Ludwig, LCSWA 03/04/2015, 4:30pm

## 2015-03-04 NOTE — Evaluation (Signed)
Physical Therapy Evaluation Patient Details Name: [name] MRN: [id number] DOB: [birth year] Today's Date: 03/04/2015   History of Present Illness  Hx of metastatic lung CA, recent hemiarthroplasty Lt hip, had an additonal fall resulting in dislocation of Lt hip. S/p reduction of Lt hip with CLOSED MANIPULATION.  Clinical Impression  Pt admitted with the above complications and underwent the above procedure. Pt currently with functional limitations due to the deficits listed below (see PT Problem List). Reviewed hip precautions and safety with mobility. Limited to bed mobility due to pain. Required min assist to sit EOB and maintain Lt hip posterior precautions throughout. Tolerated exercises fairly well.  Pt will benefit from skilled PT to increase their independence and safety with mobility to allow discharge to the venue listed below.       Follow Up Recommendations SNF    Equipment Recommendations  None recommended by PT    Recommendations for Other Services       Precautions / Restrictions Precautions Precautions: Fall;Posterior Hip Precaution Booklet Issued: Yes (comment) Precaution Comments: Reviewed precautions Required Braces or Orthoses: Knee Immobilizer - Left Knee Immobilizer - Left: On at all times Restrictions Weight Bearing Restrictions: Yes LLE Weight Bearing: Weight bearing as tolerated      Mobility  Bed Mobility Overal bed mobility: Needs Assistance Bed Mobility: Supine to Sit;Sit to Supine     Supine to sit: Min assist Sit to supine: Min assist   General bed mobility comments: Min assist for LLE support in and out of bed. Truncal support to rise to edge of bed.  Very guarded and became tearful with movement. Cues throughout for safe guarding of posterior hip precautions.  Transfers                    Ambulation/Gait                Stairs            Wheelchair Mobility    Modified Rankin (Stroke Patients Only)        Balance Overall balance assessment: Needs assistance Sitting-balance support: Single extremity supported;Feet supported Sitting balance-Leahy Scale: Poor Sitting balance - Comments: Sat EOB for several minutes but guarded due to pain with weight on Lt hip. Went through safe motions in seated position to demonstrate safe mobility without breaking hip precautions.                                      Pertinent Vitals/Pain Pain Assessment: Faces Faces Pain Scale: Hurts whole lot Pain Location: Lt hip Pain Descriptors / Indicators: Crying;Grimacing;Guarding Pain Intervention(s): Monitored during session;Repositioned;Premedicated before session;Limited activity within patient's tolerance;Ice applied    Home Living Family/patient expects to be discharged to:: Skilled nursing facility ([facility]) Living Arrangements:  (SNF) Available Help at Discharge: [place]: Bedside commode;Grab bars - toilet;Hand held shower head;Walker - 2 wheels Additional Comments: Has transitioned to SNF following hemiarthroplasty    Prior Function Level of Independence: Needs assistance   Gait / Transfers Assistance Needed: Ambulating with RW and some assist. Able to transfer on own           Hand Dominance        Extremity/Trunk Assessment   Upper Extremity Assessment: Defer to OT evaluation           Lower Extremity  Assessment: LLE deficits/detail   LLE Deficits / Details: Decreased strength and ROM, limited assessment due to precautions and pain.     Communication   Communication: No difficulties  Cognition Arousal/Alertness: Awake/alert Behavior During Therapy: WFL for tasks assessed/performed Overall Cognitive Status: Within Functional Limits for tasks assessed                      General Comments      Exercises Total Joint Exercises Ankle Circles/Pumps: AROM;Both;Supine;10 reps;15 reps Quad Sets:  Both;Supine;Strengthening;5 reps Hip ABduction/ADduction: AAROM;10 reps;Left;Supine      Assessment/Plan    PT Assessment Patient needs continued PT services  PT Diagnosis Difficulty walking;Acute pain   PT Problem List Decreased strength;Decreased range of motion;Decreased activity tolerance;Decreased mobility;Decreased knowledge of use of DME;Pain;Decreased balance;Decreased knowledge of precautions  PT Treatment Interventions DME instruction;Gait training;Stair training;Therapeutic activities;Functional mobility training;Therapeutic exercise;Patient/family education;Balance training;Neuromuscular re-education;Modalities   PT Goals (Current goals can be found in the Care Plan section) Acute Rehab PT Goals Patient Stated Goal: Return to rehab to gain independence PT Goal Formulation: With patient Time For Goal Achievement: 03/18/15 Potential to Achieve Goals: Fair    Frequency Min 3X/week   Barriers to discharge        Co-evaluation               End of Session Equipment Utilized During Treatment: Gait belt Activity Tolerance: Patient limited by pain Patient left: in bed;with call bell/phone within reach Nurse Communication: Mobility status         Time: 5284-1324 PT Time Calculation (min) (ACUTE ONLY): 21 min   Charges:   PT Evaluation $Initial PT Evaluation Tier I: 1 Procedure     PT G CodesEllouise Newer 03/04/2015, 6:32 PM Camille Bal [place], [place]

## 2015-03-04 NOTE — Op Note (Signed)
[name], [place] NO.:  [id number]  MEDICAL RECORD NO.:  [id number]  LOCATION:  6N07C                        FACILITY:  [facility]  PHYSICIAN:  Lind Guest. Ninfa Linden, M.D.DATE OF BIRTH:  [birth year]  DATE OF PROCEDURE:  03/03/2015 DATE OF DISCHARGE:                              OPERATIVE REPORT   PREOPERATIVE DIAGNOSIS:  Left bipolar hip hemiarthroplasty dislocation.  POSTOPERATIVE DIAGNOSES: 1. Left bipolar hip hemiarthroplasty dislocation. 2. Superficial left hip wound dehiscence.  PROCEDURE: 1. Closed reduction under general anesthesia of left prosthetic hip     hemiarthroplasty dislocation. 2. Primary closure of left hip wound dehiscence with 2-0 nylon     sutures.  SURGEON:  Lind Guest. Ninfa Linden, M.D.  ASSISTANT:  Erskine Emery, P.A.  ANESTHESIA:  General.  BLOOD LOSS:  Minimal.  COMPLICATIONS:  None.  ANTIBIOTICS:  1 g IV vancomycin.  INDICATIONS:  Ms. 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  Over 2 weeks ago, she underwent a left hip hemiarthroplasty for femoral neck fracture that was done through a direct anterior approach.  On Friday, she was at a nursing center and whether she was sleepwalking or not she was confused and got up and actually fell hard out of bed.  She had the inability to ambulate over the weekend and x-rays were obtained on Monday which showed the hip dislocation.  She has been admitted for a closed versus open reduction of this hip being the difficulty of getting under sedation due to her chronic pain medicine needs and the fact that this is bipolar arthroplasty and may make her closed reduction difficult.  Her and her family understand the need to proceed to the operating room for closed versus open reduction of her hip dislocation. Informed consent was obtained, and her left  total leg was marked.  DESCRIPTION OF PROCEDURE:  After informed consent obtained, appropriate left leg was marked,  she was brought to the operating room.  General anesthesia was obtained while she was on her stretcher.  We attempted to reduce her hip while she was on her stretcher but it was too difficult. I put traction boots on both of her feet and placed her supine on the Hana fracture table.  Time-out was called and she was identified as the correct patient and correct left hip.  We did __________ superficial wound dehiscence at the proximal aspect of her wound.  With traction, internal rotation, and extra fine traction on the Hana table, we were able to reduce the hip.  We then assessed her under direct fluoroscopy and it was stable.  I cleaned the small wound with Betadine and then placed a few simple 2-0 sutures in the wound per the dehisced.  Placed into Xeroform well-padded sterile dressing.  She was then taken off the Hana table, awakened, extubated, and taken to the recovery room in stable condition.  All final counts were correct.  There were no complications noted.  Postoperatively, we will let her weight-bear as tolerated, but we will keep her in a knee immobilizer for a  few weeks with hip precautions as well.     Lind Guest. Ninfa Linden, M.D.     CYB/MEDQ  D:  03/03/2015  T:  03/04/2015  Job:  [id number]

## 2015-03-05 MED ORDER — MORPHINE SULFATE ER 30 MG PO TBCR: 30.0000 mg | EXTENDED_RELEASE_TABLET | Freq: Three times a day (TID) | ORAL | Status: DC

## 2015-03-05 MED ORDER — MORPHINE SULFATE 15 MG PO TABS: 30.0000 mg | ORAL_TABLET | ORAL | Status: DC | PRN

## 2015-03-05 MED ORDER — FENTANYL 100 MCG/HR TD PT72: 100.0000 ug | MEDICATED_PATCH | TRANSDERMAL | Status: AC

## 2015-03-05 NOTE — Discharge Summary (Signed)
Patient ID: Bahar Shelden MRN: [id number] DOB/AGE: [birth year] 48 y.o.  Admit date: 03/02/2015 Discharge date: 03/05/2015  Admission Diagnoses:  Active Problems:   Hip dislocation, left   Discharge Diagnoses:  Same  Past Medical History  Diagnosis Date  . Cancer     left bronchioles  . Radiation 11/24/14-12/05/14    Left ribs 30 Gy in 10 fractions    Surgeries: Procedure(s): CLOSED MANIPULATION HIP on 03/02/2015 - 03/03/2015   Consultants:    Discharged Condition: Improved  Hospital Course: Verba Ainley is an 49 y.o. female who was admitted 03/02/2015 for operative treatment of<principal problem not specified>. Patient has severe unremitting pain that affects sleep, daily activities, and work/hobbies. After pre-op clearance the patient was taken to the operating room on 03/02/2015 - 03/03/2015 and underwent  Procedure(s): [place].    Patient was given perioperative antibiotics: Anti-infectives    Start     Dose/Rate Route Frequency Ordered Stop   03/03/15 1529  vancomycin (VANCOCIN) 1 GM/200ML IVPB    Comments:  O'Laughlin, Karen   : cabinet override      03/03/15 1529 03/03/15 1700       Patient was given sequential compression devices, early ambulation, and chemoprophylaxis to prevent DVT.  Patient benefited maximally from hospital stay and there were no complications.    Recent vital signs: Patient Vitals for the past 24 hrs:  BP Temp Temp src Pulse Resp SpO2  03/05/15 0856 - - - - - 99 %  03/04/15 2227 129/80 mmHg 97.5 F (36.4 C) Oral 80 17 97 %  03/04/15 1500 122/78 mmHg 97.5 F (36.4 C) Oral 77 18 100 %  03/04/15 1009 - - - - - 100 %     Recent laboratory studies:  Recent Labs  03/02/15 1825  WBC 10.3  HGB 11.0*  HCT 34.2*  PLT 632*  NA 131*  K 3.1*  CL 98*  CO2 26  BUN 15  CREATININE 0.50  GLUCOSE 106*  CALCIUM 8.1*     Discharge Medications:     Medication List    STOP taking these medications         oxyCODONE 15 MG immediate release tablet  Commonly known as:  ROXICODONE      TAKE these medications        acetaminophen 325 MG tablet  Commonly known as:  TYLENOL  Take 2 tablets (650 mg total) by mouth every 6 (six) hours as needed for mild pain (or Fever >/= 101).     DULoxetine 30 MG capsule  Commonly known as:  CYMBALTA  Take 30 mg by mouth daily.     enoxaparin 80 MG/0.8ML injection  Commonly known as:  LOVENOX  ADMINISTER 0.8 ML UNDER THE SKIN DAILY     fentaNYL 100 MCG/HR  Commonly known as:  DURAGESIC - dosed mcg/hr  Place 1 patch (100 mcg total) onto the skin every 3 (three) days.     ferrous sulfate 325 (65 FE) MG EC tablet  Take 1 tablet (325 mg total) by mouth 2 (two) times daily with a meal.     ipratropium 0.02 % nebulizer solution  Commonly known as:  ATROVENT  Take 2.5 mLs (0.5 mg total) by nebulization 3 (three) times daily.     LIDODERM 5 %  Generic drug:  lidocaine  1 patch. To left ant chest wall, 12 hours on/12 hours off, Remove & Discard patch within 12 hours or as directed by MD  LORazepam 1 MG tablet  Commonly known as:  ATIVAN  Take 1 tablet (1 mg total) by mouth every 6 (six) hours as needed for anxiety.     morphine 15 MG tablet  Commonly known as:  MSIR  Take 2 tablets (30 mg total) by mouth every 4 (four) hours as needed for severe pain.     morphine 30 MG 12 hr tablet  Commonly known as:  MS CONTIN  Take 1 tablet (30 mg total) by mouth every 8 (eight) hours.     omeprazole 20 MG capsule  Commonly known as:  PRILOSEC  Take 1 capsule (20 mg total) by mouth 2 (two) times daily before a meal.     ondansetron 8 MG tablet  Commonly known as:  ZOFRAN  TAKE 1 TABLET(8 MG) BY MOUTH EVERY 8 HOURS AS NEEDED FOR NAUSEA OR VOMITING     polyethylene glycol packet  Commonly known as:  MIRALAX / GLYCOLAX  Give 6-8 oz Three times daily for constipation, if BM are more frequent change to BID     senna-docusate 8.6-50 MG per tablet  Commonly  known as:  Senokot-S  Take 1 tablet by mouth at bedtime.        Diagnostic Studies: Dg Ribs Unilateral W/chest Left  02/09/2015   CLINICAL DATA:  Fall last Wednesday left mid axillary rib pain.  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 No confluent opacities on the right. No visible rib fracture. Heart is normal size.  IMPRESSION: Continued complete opacification of the left hemithorax as seen on prior chest CT.   Electronically Signed   By: Rolm Baptise M.D.   On: 02/09/2015 14:55   Dg Knee 1-2 Views Left  03/02/2015   CLINICAL DATA:  Left thigh pain after fall at nursing home 4 days ago.  EXAM: LEFT KNEE - 1-2 VIEW  COMPARISON:  None.  FINDINGS: There is no evidence of fracture, dislocation, or joint effusion. There is no evidence of arthropathy or other focal bone abnormality. Soft tissues are unremarkable.  IMPRESSION: Negative.   Electronically Signed   By: Marin Olp M.D.   On: 03/02/2015 20:15   Ct Head Wo Contrast  03/02/2015   CLINICAL DATA:  Fall on Friday. Dislocation of left hip replacement. Possible head injury at the time of fall. History of lung cancer.  EXAM: CT HEAD WITHOUT CONTRAST  TECHNIQUE: Contiguous axial images were obtained from the base of the skull through the vertex without intravenous contrast.  COMPARISON:  10/22/2014  FINDINGS: The brainstem, cerebellum, cerebral peduncles, thalamus, basal ganglia, basilar cisterns, and ventricular system appear within normal limits. No intracranial hemorrhage, mass lesion, or acute CVA.  IMPRESSION: 1. No significant intracranial abnormality is identified. No current evidence of intracranial metastatic disease on today's noncontrast CT head.   Electronically Signed   By: Van Clines M.D.   On: 03/02/2015 19:56   Dg Pelvis Portable  03/03/2015   CLINICAL DATA:  Post  close reduction of a dislocated left hip prosthesis.  EXAM: PORTABLE PELVIS 1-2 VIEWS  COMPARISON:  Immediate postoperative AP pelvis x-rays 02/10/2015.  FINDINGS: Anatomic alignment of the bipolar left hip arthroplasty in the AP projection. When compared to the immediate postoperative examination, the acetabular cup has rotated into a more horizontal orientation. No acute fractures. Contralateral right hip joint intact.  IMPRESSION:  Anatomic alignment of the left hip prosthesis in the AP projection post reduction. Interval rotation of the acetabular cup into a more horizontal orientation since the immediate postoperative exam 02/10/2015.   Electronically Signed   By: Evangeline Dakin M.D.   On: 03/03/2015 19:10   Pelvis Portable  02/10/2015   CLINICAL DATA:  Patient status post left hip hemiarthroplasty.  EXAM: PORTABLE PELVIS 1-2 VIEWS  COMPARISON:  CT pelvis 02/09/2015  FINDINGS: Patient status post left hip hemiarthroplasty. AP portable pelvic images demonstrate hardware to be in appropriate position. Overlying surgical staple line. Soft tissue gas compatible with postoperative state.  IMPRESSION: Patient status post left hip hemiarthroplasty.   Electronically Signed   By: Lovey Newcomer M.D.   On: 02/10/2015 18:47   Ct Hip Left Wo Contrast  02/09/2015   CLINICAL DATA:  Status post fall 6 days ago.  Left hip pain.  EXAM: CT OF THE LEFT HIP WITHOUT CONTRAST  TECHNIQUE: Multidetector CT imaging of the left hip was performed according to the standard protocol. Multiplanar CT image reconstructions were also generated.  COMPARISON:  None.  FINDINGS: There is a comminuted, impacted and mildly displaced left subcapital fracture. There is no other fracture or dislocation. There is no lytic or sclerotic osseous lesion. The superior and inferior pubic rami are intact. The muscles are normal. There is no fluid collection or hematoma.  There is no pelvic free fluid.  Visualized bladder is normal.  IMPRESSION: Comminuted,  impacted and mildly displaced left femoral subcapital fracture.   Electronically Signed   By: Kathreen Devoid   On: 02/09/2015 17:04   Dg C-arm 1-60 Min-no Report  02/10/2015   CLINICAL DATA: surgery   C-ARM 1-60 MINUTES  Fluoroscopy was utilized by the requesting physician.  No radiographic  interpretation.    Dg Hip Operative Unilat With Pelvis Left  03/03/2015   CLINICAL DATA:  Left hip replacement.  EXAM: OPERATIVE LEFT HIP (WITH PELVIS IF PERFORMED) 2 VIEWS  TECHNIQUE: Fluoroscopic spot image(s) were submitted for interpretation post-operatively.  COMPARISON:  03/02/2015.  FINDINGS: Left hip replacement with good anatomic alignment. Hardware intact. No acute abnormality.  IMPRESSION: Left hip replacement with good anatomic alignment . Hardware intact .   Electronically Signed   By: Marcello Moores  Register   On: 03/03/2015 17:24   Dg Hip Operative Unilat With Pelvis Left  02/10/2015   CLINICAL DATA:  Patient with left hip fracture.  EXAM: OPERATIVE LEFT HIP (WITH PELVIS IF PERFORMED) 2 VIEWS  TECHNIQUE: Fluoroscopic spot image(s) were submitted for interpretation post-operatively.  COMPARISON:  Head CT 10/22/2014; left hip CT 02/09/2015  FINDINGS: Patient status post left hip arthroplasty. Hardware appears in appropriate position.  IMPRESSION: Patient status post left hip arthroplasty.   Electronically Signed   By: Lovey Newcomer M.D.   On: 02/10/2015 18:01   Dg Hip Unilat W Or W/o Pelvis 2-3 Views Left  02/09/2015   CLINICAL DATA:  Fall, left hip pain  EXAM: DG HIP (WITH OR WITHOUT PELVIS) 2-3V LEFT  COMPARISON:  None.  FINDINGS: Subcapital left hip fracture with foreshortening and varus angulation.  Bilateral hip joint spaces are symmetric.  Visualized bony pelvis appears intact.  IMPRESSION: Subcapital left hip fracture, as above.  These results will be called to the ordering clinician or representative by the Radiology Department at the imaging location.   Electronically Signed   By: Julian Hy M.D.    On: 02/09/2015 14:56   Dg Hip Unilat With Pelvis Min 4 Views Left  03/02/2015   CLINICAL DATA:  49 year old female with hip pain  EXAM: DG HIP (WITH OR WITHOUT PELVIS) 4+V LEFT  COMPARISON:  Radiograph dated 02/10/2015  FINDINGS: There is a total left hip arthroplasty. There is complete dislocation of the left hip with proximal migration of the left femoral arthroplasty above the acetabulum. No definite acute fracture identified.  IMPRESSION: Dislocated left hip arthroplasty with proximal migration.   Electronically Signed   By: Anner Crete M.D.   On: 03/02/2015 19:26    Disposition: 03-Skilled Nursing Facility      Discharge Instructions    Call MD / Call 911    Complete by:  As directed   If you experience chest pain or shortness of breath, CALL 911 and be transported to the hospital emergency room.  If you develope a fever above 101 F, pus (white drainage) or increased drainage or redness at the wound, or calf pain, call your surgeon's office.     Constipation Prevention    Complete by:  As directed   Drink plenty of fluids.  Prune juice may be helpful.  You may use a stool softener, such as Colace (over the counter) 100 mg twice a day.  Use MiraLax (over the counter) for constipation as needed.     Diet - low sodium heart healthy    Complete by:  As directed      Discharge patient    Complete by:  As directed      Full weight bearing    Complete by:  As directed      Increase activity slowly as tolerated    Complete by:  As directed      Posterior total hip precautions    Complete by:  As directed            Follow-up Information    Follow up with Mcarthur Rossetti, MD. Schedule an appointment as soon as possible for a visit in 2 weeks.   Specialty:  Orthopedic Surgery   Contact information:   [place] [address] [phone]        Signed: Mcarthur Rossetti 03/05/2015, 9:26 AM   !

## 2015-03-05 NOTE — Progress Notes (Signed)
Patient ID: [name], female   DOB: Nov 01, 1965, 49 y.o.   MRN: [id number] Doing well.  Left hip stable and located.  Knee immobilizer in place.  Can discharge to [facility] today.

## 2015-03-05 NOTE — Progress Notes (Signed)
IV's dc'd.  Pt discharged to [facility] place via transport.  Report called and given to Kathlee Nations.  Belongings sent with pt

## 2015-03-05 NOTE — Discharge Instructions (Signed)
Can get incision wet daily in the shower. New dry dressing left hip every other day. Full weight bearing as tolerated. Stay in knee immobilizer until outpatient ortho follow-up. Posterior hip precautions

## 2015-03-05 NOTE — Progress Notes (Signed)
Physical Therapy Treatment Patient Details Name: [name] MRN: [id number] DOB: 1966-02-03 Today's Date: 03/05/2015    History of Present Illness Hx of metastatic lung CA, recent hemiarthroplasty Lt hip, had an additonal fall resulting in dislocation of Lt hip. S/p reduction of Lt hip with CLOSED MANIPULATION.    PT Comments    Good progress and tolerance to treatment today. Min assist for bed mobility and transfers. Ambulated up to 10 feet with close guard assist. Needs assist to maintain posterior hip precautions at times although verbally recalls with 3/3 accuracy. Performed therapeutic exercises well. Patient will continue to benefit from skilled physical therapy services to further improve independence with functional mobility.  Follow Up Recommendations  SNF     Equipment Recommendations  None recommended by PT    Recommendations for Other Services       Precautions / Restrictions Precautions Precautions: Fall;Posterior Hip Precaution Booklet Issued: Yes (comment) Precaution Comments: Reviewed precautions Required Braces or Orthoses: Knee Immobilizer - Left Restrictions Weight Bearing Restrictions: Yes LLE Weight Bearing: Weight bearing as tolerated    Mobility  Bed Mobility Overal bed mobility: Needs Assistance Bed Mobility: Supine to Sit Rolling: Min assist   Supine to sit: Min assist     General bed mobility comments: Min assist for LLE support only. Able to bring trunk to seated position. Pain with WB through Lt hip while sitting. Cues for precautions throughout.  Transfers Overall transfer level: Needs assistance Equipment used: Rolling walker (2 wheeled) Transfers: Sit to/from Stand Sit to Stand: Min assist         General transfer comment: VC for hand placement and to maintain hip precautions with transition to stand. Min assist for boost initiatlly. Good stability once upright.  Ambulation/Gait Ambulation/Gait assistance: Min  guard Ambulation Distance (Feet): 10 Feet Assistive device: Rolling walker (2 wheeled) Gait Pattern/deviations: Step-to pattern;Step-through pattern;Decreased stance time - left;Decreased stride length;Antalgic;Trendelenburg Gait velocity: decreased Gait velocity interpretation: Below normal speed for age/gender General Gait Details: Tactile cues for glute medius activation in Lt stance phase of gait. VC for walker control and to increase step length and tolerated. No buckling noted. Bears majority of weight through LLE.   Stairs            Wheelchair Mobility    Modified Rankin (Stroke Patients Only)       Balance Overall balance assessment: Needs assistance Sitting-balance support: No upper extremity supported;Feet supported Sitting balance-Leahy Scale: Fair   Postural control: Right lateral lean;Posterior lean Standing balance support: Bilateral upper extremity supported Standing balance-Leahy Scale: Poor Standing balance comment: Pre-gait balance activity in standing with focus on weight shifting and unloading foot from floor.                    Cognition Arousal/Alertness: Awake/alert Behavior During Therapy: WFL for tasks assessed/performed Overall Cognitive Status: Within Functional Limits for tasks assessed                      Exercises Total Joint Exercises Ankle Circles/Pumps: AROM;Both;Supine;10 reps Quad Sets: Both;Supine;Strengthening;10 reps Gluteal Sets: Strengthening;Both;10 reps;Seated    General Comments General comments (skin integrity, edema, etc.): Recalled 3/3 precautions. Needs assist to maintain with transfers though.      Pertinent Vitals/Pain Pain Assessment: 0-10 Pain Score: 5  Faces Pain Scale: Hurts whole lot Pain Location: Lt hip Pain Descriptors / Indicators: Discomfort;Grimacing;Guarding Pain Intervention(s): Limited activity within patient's tolerance;Monitored during session;Repositioned;Patient requesting pain  meds-RN notified    Home  Living                      Prior Function            PT Goals (current goals can now be found in the care plan section) Acute Rehab PT Goals Patient Stated Goal: Return to rehab to gain independence PT Goal Formulation: With patient Time For Goal Achievement: 03/18/15 Potential to Achieve Goals: Fair Progress towards PT goals: Progressing toward goals    Frequency  Min 3X/week    PT Plan Current plan remains appropriate    Co-evaluation             End of Session Equipment Utilized During Treatment: Gait belt Activity Tolerance: Patient tolerated treatment well Patient left: with call bell/phone within reach;in chair     Time: 0349-1791 PT Time Calculation (min) (ACUTE ONLY): 20 min  Charges:  $Therapeutic Activity: 8-22 mins                    G Codes:      Ellouise Newer 04/03/15, 12:26 PM  Camille Bal [place], [place]

## 2015-03-06 ENCOUNTER — Other Ambulatory Visit: Payer: Self-pay | Admitting: *Deleted

## 2015-03-06 ENCOUNTER — Encounter: Payer: Self-pay | Admitting: Adult Health

## 2015-03-06 ENCOUNTER — Non-Acute Institutional Stay: Payer: 59 | Admitting: Adult Health

## 2015-03-06 DIAGNOSIS — F329 Major depressive disorder, single episode, unspecified: Secondary | ICD-10-CM

## 2015-03-06 DIAGNOSIS — D62 Acute posthemorrhagic anemia: Secondary | ICD-10-CM

## 2015-03-06 DIAGNOSIS — F32A Depression, unspecified: Secondary | ICD-10-CM

## 2015-03-06 DIAGNOSIS — T402X5A Adverse effect of other opioids, initial encounter: Secondary | ICD-10-CM

## 2015-03-06 DIAGNOSIS — C3490 Malignant neoplasm of unspecified part of unspecified bronchus or lung: Secondary | ICD-10-CM

## 2015-03-06 DIAGNOSIS — F419 Anxiety disorder, unspecified: Secondary | ICD-10-CM

## 2015-03-06 DIAGNOSIS — C3492 Malignant neoplasm of unspecified part of left bronchus or lung: Secondary | ICD-10-CM

## 2015-03-06 DIAGNOSIS — K5903 Drug induced constipation: Secondary | ICD-10-CM

## 2015-03-06 DIAGNOSIS — K219 Gastro-esophageal reflux disease without esophagitis: Secondary | ICD-10-CM

## 2015-03-06 DIAGNOSIS — S73005S Unspecified dislocation of left hip, sequela: Secondary | ICD-10-CM

## 2015-03-06 DIAGNOSIS — E43 Unspecified severe protein-calorie malnutrition: Secondary | ICD-10-CM

## 2015-03-06 MED ORDER — MORPHINE SULFATE 15 MG PO TABS: 30.0000 mg | ORAL_TABLET | ORAL | Status: DC | PRN

## 2015-03-06 NOTE — Telephone Encounter (Signed)
[facility] 

## 2015-03-08 NOTE — Progress Notes (Signed)
Patient ID: [name], female   DOB: 1966-04-02, 49 y.o.   MRN: [id number]      DATE:  03/06/15 MRN:  [id number]  BIRTHDAY: 1966-06-18  Facility:  Nursing Home Location:  [place] Room Number: 907-P  LEVEL OF CARE:  SNF (31)  Contact Information    Name Relation Home Work [place] Brother [phone]  [phone]       Chief Complaint  Patient presents with  . Hospitalization Follow-up    Left hip dislocation S/P closed reduction, depression, anemia, anxiety, GERD, constipation, lung cancer and protein calorie malnutrition     HISTORY OF PRESENT ILLNESS:  This is a 49 year old female who has been readmitted to [facility] on 03/05/15 from [facility]. She had a recent left hip hemiarthroplasty for a femoral neck fracture. She was discharged to [facility]  On 8/27 for rehabilitation. 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 She fell out of bed and sustained a left hip dislocation of hemiarthroplasty. She had close reduction of left hip dislocation on 9/14.  PAST MEDICAL HISTORY:  Past Medical History  Diagnosis Date  . Cancer     left bronchioles  . Radiation 11/24/14-12/05/14    Left ribs 30 Gy in 10 fractions     CURRENT MEDICATIONS: Reviewed  Patient's Medications  New Prescriptions   No medications on file  Previous Medications   ACETAMINOPHEN (TYLENOL) 325 MG TABLET    Take 2 tablets (650 mg total) by mouth every 6 (six) hours as needed for mild pain (or Fever >/= 101).   DULOXETINE (CYMBALTA) 30 MG CAPSULE    Take 30 mg by mouth daily.    ENOXAPARIN (LOVENOX) 80 MG/0.8ML INJECTION    ADMINISTER 0.8 ML UNDER THE SKIN DAILY   FENTANYL (DURAGESIC - DOSED MCG/HR) 100 MCG/HR    Place 1 patch (100 mcg total) onto the skin every 3 (three) days.   FERROUS SULFATE 325 (65 FE) MG EC TABLET    Take 1 tablet (325 mg total) by mouth 2 (two) times daily with a meal.   IPRATROPIUM (ATROVENT) 0.02 % NEBULIZER SOLUTION    Take 2.5 mLs (0.5 mg total) by nebulization 3 (three) times daily.   LIDOCAINE (LIDODERM) 5 %    1 patch. To left ant chest wall, 12 hours on/12 hours off, Remove & Discard patch within 12 hours or as directed by MD   LORAZEPAM (ATIVAN) 1 MG TABLET    Take 1 tablet (1 mg total) by mouth every 6 (six) hours as needed for anxiety.   MORPHINE (MS CONTIN) 30 MG 12 HR TABLET    Take 1 tablet (30 mg total) by mouth every 8 (eight) hours.   MORPHINE (MSIR) 15 MG TABLET    Take 2 tablets (30 mg total) by mouth every 4 (four) hours as needed for severe pain.   OMEPRAZOLE (PRILOSEC) 20 MG CAPSULE    Take 1 capsule (20 mg total) by mouth 2 (two) times daily before a meal.   ONDANSETRON (ZOFRAN) 8 MG TABLET    TAKE 1 TABLET(8 MG) BY MOUTH EVERY 8 HOURS AS NEEDED FOR NAUSEA OR VOMITING   POLYETHYLENE GLYCOL (MIRALAX / GLYCOLAX) PACKET    Give 6-8 oz Three times daily for constipation, if BM are more frequent change to BID   SENNA-DOCUSATE (SENOKOT-S) 8.6-50 MG PER TABLET    Take 1 tablet by mouth at bedtime.  Modified  Medications   No medications on file  Discontinued Medications   No medications on file     Allergies  Allergen Reactions  . Penicillins Rash    On MAR unable to answer questions     REVIEW OF SYSTEMS:  GENERAL: no change in appetite, no fatigue, no fever, chills or weakness EYES: Denies change in vision, dry eyes, eye pain, itching or discharge EARS: Denies change in hearing, ringing in ears, or earache NOSE: Denies nasal congestion or epistaxis MOUTH and THROAT: Denies oral discomfort, gingival pain or bleeding, pain from teeth or hoarseness   RESPIRATORY: no cough, SOB, DOE, wheezing, hemoptysis CARDIAC: no chest pain, edema or palpitations GI: no abdominal pain, diarrhea, constipation, heart burn, nausea or vomiting GU: Denies dysuria, frequency, hematuria, incontinence, or discharge PSYCHIATRIC:  No report of hallucinations,  insomnia, paranoia, or agitation   PHYSICAL EXAMINATION  GENERAL APPEARANCE: Well nourished. In no acute distress.  SKIN:  Wound dehiscence with 4 sutures on left hip HEAD: Normal in size and contour. No evidence of trauma EYES: Lids open and close normally. No blepharitis, entropion or ectropion. PERRL. Conjunctivae are clear and sclerae are white. Lenses are without opacity EARS: Pinnae are normal. Patient hears normal voice tunes of the examiner MOUTH and THROAT: Lips are without lesions. Oral mucosa is moist and without lesions. Tongue is normal in shape, size, and color and without lesions NECK: supple, trachea midline, no neck masses, no thyroid tenderness, no thyromegaly LYMPHATICS: no LAN in the neck, no supraclavicular LAN RESPIRATORY: breathing is even & unlabored, BS CTAB CARDIAC: RRR, no murmur,no extra heart sounds, no edema GI: abdomen soft, normal BS, no masses, no tenderness, no hepatomegaly, no splenomegaly EXTREMITIES:  Able to move 4 extremities; LLE immobilizer PSYCHIATRIC: Alert and oriented X 3. Affect and behavior are appropriate  LABS/RADIOLOGY: Labs reviewed: Basic Metabolic Panel:  Recent Labs  09/16/14 0100  09/19/14 0500  02/11/15 0443  02/13/15 0549 02/14/15 0456 02/24/15 1053 03/02/15 1825  NA 131*  < > 132*  < > 130*  < > 127* 128* 130* 131*  K 2.9*  < > 3.7  < > 3.7  < > 3.5 3.6 3.8 3.1*  CL 93*  < > 94*  < > 101  < > 96* 96*  --  98*  CO2 31  < > 32  < > 27  < > '27 25 23 26  '$ GLUCOSE 93  < > 138*  < > 126*  < > 98 76 120 106*  BUN 8  < > 6  < > 6  < > 6 6 5.4* 15  CREATININE 0.43*  < > 0.40*  < > 0.47  < > 0.37* 0.36* 0.6 0.50  CALCIUM 7.9*  < > 8.2*  < > 8.1*  < > 8.5* 8.6* 9.8 8.1*  MG 1.4*  --  1.6  --  1.5*  --   --   --   --   --   PHOS  --   --  2.3  --   --   --   --   --   --   --   < > = values in this interval not displayed. Liver Function Tests:  Recent Labs  02/09/15 1057 02/10/15 0325 02/24/15 1053  AST 7 9* 12  ALT <6  <5* <6  ALKPHOS 150 137* 165*  BILITOT 0.41 0.5 0.67  PROT 5.5* 5.6* 6.0*  ALBUMIN 2.2* 2.4* 2.6*    Recent Labs  09/10/14 1915  LIPASE 13   CBC:  Recent Labs  02/09/15 1613  02/13/15 0549 02/24/15 1053 03/02/15 1825  WBC 10.6*  < > 9.9 12.6* 10.3  NEUTROABS 9.2*  --   --  11.0* 8.4*  HGB 10.7*  < > 10.0* 12.0 11.0*  HCT 33.6*  < > 30.9* 36.8 34.2*  MCV 77.2*  < > 76.3* 75.9* 77.7*  PLT 564*  < > 414* 975* 632*  < > = values in this interval not displayed.  Cardiac Enzymes:  Recent Labs  09/13/14 1712 09/13/14 2259 09/14/14 0330  TROPONINI <0.03 <0.03 <0.03   CBG:  Recent Labs  10/22/14 1419  GLUCAP 85     Dg Ribs Unilateral W/chest Left  02/09/2015   CLINICAL DATA:  Fall last Wednesday left mid axillary rib pain.  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 No confluent opacities on the right. No visible rib fracture. Heart is normal size.  IMPRESSION: Continued complete opacification of the left hemithorax as seen on prior chest CT.   Electronically Signed   By: Rolm Baptise M.D.   On: 02/09/2015 14:55   Dg Knee 1-2 Views Left  03/02/2015   CLINICAL DATA:  Left thigh pain after fall at nursing home 4 days ago.  EXAM: LEFT KNEE - 1-2 VIEW  COMPARISON:  None.  FINDINGS: There is no evidence of fracture, dislocation, or joint effusion. There is no evidence of arthropathy or other focal bone abnormality. Soft tissues are unremarkable.  IMPRESSION: Negative.   Electronically Signed   By: Marin Olp M.D.   On: 03/02/2015 20:15   Ct Head Wo Contrast  03/02/2015   CLINICAL DATA:  Fall on Friday. Dislocation of left hip replacement. Possible head injury at the time of fall. History of lung cancer.  EXAM: CT HEAD WITHOUT CONTRAST  TECHNIQUE: Contiguous axial images were obtained from the base of the skull through  the vertex without intravenous contrast.  COMPARISON:  10/22/2014  FINDINGS: The brainstem, cerebellum, cerebral peduncles, thalamus, basal ganglia, basilar cisterns, and ventricular system appear within normal limits. No intracranial hemorrhage, mass lesion, or acute CVA.  IMPRESSION: 1. No significant intracranial abnormality is identified. No current evidence of intracranial metastatic disease on today's noncontrast CT head.   Electronically Signed   By: Van Clines M.D.   On: 03/02/2015 19:56   Dg Pelvis Portable  03/03/2015   CLINICAL DATA:  Post close reduction of a dislocated left hip prosthesis.  EXAM: PORTABLE PELVIS 1-2 VIEWS  COMPARISON:  Immediate postoperative AP pelvis x-rays 02/10/2015.  FINDINGS: Anatomic alignment of the bipolar left hip arthroplasty in the AP projection. When compared to the immediate postoperative examination, the acetabular cup has rotated into a more horizontal orientation. No acute fractures. Contralateral right hip joint intact.  IMPRESSION: Anatomic alignment of the left hip prosthesis in the AP projection post reduction. Interval rotation of the acetabular cup into a more horizontal orientation since the immediate postoperative exam 02/10/2015.   Electronically Signed   By: Evangeline Dakin M.D.   On: 03/03/2015 19:10   Pelvis Portable  02/10/2015   CLINICAL DATA:  Patient status post left hip hemiarthroplasty.  EXAM: PORTABLE PELVIS 1-2 VIEWS  COMPARISON:  CT pelvis 02/09/2015  FINDINGS: Patient status post left hip hemiarthroplasty. AP portable pelvic images demonstrate hardware to be in appropriate  position. Overlying surgical staple line. Soft tissue gas compatible with postoperative state.  IMPRESSION: Patient status post left hip hemiarthroplasty.   Electronically Signed   By: Lovey Newcomer M.D.   On: 02/10/2015 18:47   Ct Hip Left Wo Contrast  02/09/2015   CLINICAL DATA:  Status post fall 6 days ago.  Left hip pain.  EXAM: CT OF THE LEFT HIP WITHOUT  CONTRAST  TECHNIQUE: Multidetector CT imaging of the left hip was performed according to the standard protocol. Multiplanar CT image reconstructions were also generated.  COMPARISON:  None.  FINDINGS: There is a comminuted, impacted and mildly displaced left subcapital fracture. There is no other fracture or dislocation. There is no lytic or sclerotic osseous lesion. The superior and inferior pubic rami are intact. The muscles are normal. There is no fluid collection or hematoma.  There is no pelvic free fluid.  Visualized bladder is normal.  IMPRESSION: Comminuted, impacted and mildly displaced left femoral subcapital fracture.   Electronically Signed   By: Kathreen Devoid   On: 02/09/2015 17:04   Dg C-arm 1-60 Min-no Report  02/10/2015   CLINICAL DATA: surgery   C-ARM 1-60 MINUTES  Fluoroscopy was utilized by the requesting physician.  No radiographic  interpretation.    Dg Hip Operative Unilat With Pelvis Left  03/03/2015   CLINICAL DATA:  Left hip replacement.  EXAM: OPERATIVE LEFT HIP (WITH PELVIS IF PERFORMED) 2 VIEWS  TECHNIQUE: Fluoroscopic spot image(s) were submitted for interpretation post-operatively.  COMPARISON:  03/02/2015.  FINDINGS: Left hip replacement with good anatomic alignment. Hardware intact. No acute abnormality.  IMPRESSION: Left hip replacement with good anatomic alignment . Hardware intact .   Electronically Signed   By: Marcello Moores  Register   On: 03/03/2015 17:24   Dg Hip Operative Unilat With Pelvis Left  02/10/2015   CLINICAL DATA:  Patient with left hip fracture.  EXAM: OPERATIVE LEFT HIP (WITH PELVIS IF PERFORMED) 2 VIEWS  TECHNIQUE: Fluoroscopic spot image(s) were submitted for interpretation post-operatively.  COMPARISON:  Head CT 10/22/2014; left hip CT 02/09/2015  FINDINGS: Patient status post left hip arthroplasty. Hardware appears in appropriate position.  IMPRESSION: Patient status post left hip arthroplasty.   Electronically Signed   By: Lovey Newcomer M.D.   On: 02/10/2015  18:01   Dg Hip Unilat W Or W/o Pelvis 2-3 Views Left  02/09/2015   CLINICAL DATA:  Fall, left hip pain  EXAM: DG HIP (WITH OR WITHOUT PELVIS) 2-3V LEFT  COMPARISON:  None.  FINDINGS: Subcapital left hip fracture with foreshortening and varus angulation.  Bilateral hip joint spaces are symmetric.  Visualized bony pelvis appears intact.  IMPRESSION: Subcapital left hip fracture, as above.  These results will be called to the ordering clinician or representative by the Radiology Department at the imaging location.   Electronically Signed   By: Julian Hy M.D.   On: 02/09/2015 14:56   Dg Hip Unilat With Pelvis Min 4 Views Left  03/02/2015   CLINICAL DATA:  50 year old female with hip pain  EXAM: DG HIP (WITH OR WITHOUT PELVIS) 4+V LEFT  COMPARISON:  Radiograph dated 02/10/2015  FINDINGS: There is a total left hip arthroplasty. There is complete dislocation of the left hip with proximal migration of the left femoral arthroplasty above the acetabulum. No definite acute fracture identified.  IMPRESSION: Dislocated left hip arthroplasty with proximal migration.   Electronically Signed   By: Anner Crete M.D.   On: 03/02/2015 19:26    ASSESSMENT/PLAN:  Left hip  dislocation  - S/P closed reduction of left hip dislocation - for rehabilitation; LLE for weightbearing; continue immobilizer on LLE; increase MS Contin 30 mg and 15 mg by mouth every 6AM, 2PM and 10 PM, fentanyl 100 g/hour topically every 3 days, Lidoderm 5% 1 patch transdermally daily, morphine 15 mg 2 tablets by mouth every 4 hours when necessary and start diclofenac 50 mg EC 1 tab by mouth twice a day for pain; Lovenox 80 mg subcutaneous daily for DVT prophylaxis; physiatry consult and treat; check CBC and BMP  Depression -  mood is stable; continue Cymbalta 30 mg 1 capsule by mouth daily  Anemia, acute blood loss - hemoglobin 11.0; continue ferrous sulfate 325 mg 1 tab by mouth twice a day  Anxiety - continue Ativan 1 mg 1 tab by  mouth every 6 hours when necessary  GERD - continue Prilosec 20 mg 1 capsule by mouth twice a day  Constipation - continue MiraLAX 17 mg by mouth 3 times a day and senna S 1 tab by mouth daily at bedtime  Lung cancer - end-stage; for palliative consult with [facility]  Protein calorie malnutrition, severe - albumin 2.4; continue supplementation      Goals of care:  Short-term rehabilitation and Palliative care   [facility], NP [facility] [phone]

## 2015-03-09 ENCOUNTER — Ambulatory Visit: Payer: 59

## 2015-03-09 ENCOUNTER — Ambulatory Visit: Payer: 59 | Admitting: Internal Medicine

## 2015-03-09 ENCOUNTER — Other Ambulatory Visit: Payer: 59

## 2015-03-10 ENCOUNTER — Other Ambulatory Visit: Payer: 59

## 2015-03-20 ENCOUNTER — Encounter: Payer: Self-pay | Admitting: Adult Health

## 2015-03-20 ENCOUNTER — Non-Acute Institutional Stay: Payer: 59 | Admitting: Adult Health

## 2015-03-20 DIAGNOSIS — S73005S Unspecified dislocation of left hip, sequela: Secondary | ICD-10-CM

## 2015-03-20 DIAGNOSIS — F32A Depression, unspecified: Secondary | ICD-10-CM

## 2015-03-20 DIAGNOSIS — C3490 Malignant neoplasm of unspecified part of unspecified bronchus or lung: Secondary | ICD-10-CM

## 2015-03-20 DIAGNOSIS — E43 Unspecified severe protein-calorie malnutrition: Secondary | ICD-10-CM | POA: Diagnosis not present

## 2015-03-20 DIAGNOSIS — K5903 Drug induced constipation: Secondary | ICD-10-CM

## 2015-03-20 DIAGNOSIS — T402X5A Adverse effect of other opioids, initial encounter: Secondary | ICD-10-CM

## 2015-03-20 DIAGNOSIS — F419 Anxiety disorder, unspecified: Secondary | ICD-10-CM

## 2015-03-20 DIAGNOSIS — F329 Major depressive disorder, single episode, unspecified: Secondary | ICD-10-CM

## 2015-03-20 DIAGNOSIS — K5909 Other constipation: Secondary | ICD-10-CM

## 2015-03-20 DIAGNOSIS — D62 Acute posthemorrhagic anemia: Secondary | ICD-10-CM

## 2015-03-20 DIAGNOSIS — K219 Gastro-esophageal reflux disease without esophagitis: Secondary | ICD-10-CM

## 2015-03-20 NOTE — Progress Notes (Signed)
Patient ID: [name], female   DOB: 1965/09/02, 49 y.o.   MRN: [id number]      DATE:  03/20/15 MRN:  [id number]  BIRTHDAY: [birth year]  Facility:  Nursing Home Location:  [place] Room Number: 908-P  LEVEL OF CARE:  SNF [phone])  Contact Information    Name [place] Brother [phone]  [phone]   Norval Gable [phone]  [phone]      Chief Complaint  Patient presents with  . Discharge Note    Left hip dislocation S/P closed reduction, depression, anemia, anxiety, GERD, constipation, lung cancer and protein calorie malnutrition     HISTORY OF PRESENT ILLNESS:  This is a 49 year old female who is for discharge home with Home health PT, OT, Nursing, CNA and Social worker. DME:   16" X 18" standard wheelchair, brake extensions and cushion. She has been readmitted to [facility] on 03/05/15 from [facility]. She had a recent left hip hemiarthroplasty for a femoral neck fracture. She was discharged to [facility]  On 8/27 for rehabilitation. 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 She fell out of bed and sustained a left hip dislocation of hemiarthroplasty. She had close reduction of left hip dislocation on 9/14.  Patient was admitted to this facility for short-term rehabilitation after the patient's recent hospitalization.  Patient has completed SNF rehabilitation and therapy has cleared the patient for discharge.   PAST MEDICAL HISTORY:  Past Medical History  Diagnosis Date  . Cancer     left bronchioles  . Radiation 11/24/14-12/05/14    Left ribs 30 Gy in 10 fractions     CURRENT MEDICATIONS: Reviewed  Patient's Medications  New Prescriptions   No medications on file  Previous Medications   ACETAMINOPHEN (TYLENOL) 325 MG TABLET    Take 2 tablets (650 mg total) by mouth every 6 (six) hours as needed for mild pain (or Fever >/= 101).   DULOXETINE (CYMBALTA) 30 MG CAPSULE    Take 30 mg by mouth daily.    ENOXAPARIN (LOVENOX) 80 MG/0.8ML INJECTION    ADMINISTER 0.8 ML UNDER THE SKIN DAILY   FENTANYL (DURAGESIC - DOSED MCG/HR) 100 MCG/HR    Place 1 patch (100 mcg total) onto the skin every 3 (three) days.   FERROUS SULFATE 325 (65 FE) MG EC TABLET    Take 1 tablet (325 mg total) by mouth 2 (two) times daily with a meal.   HYDROMORPHONE (DILAUDID) 2 MG TABLET    Take 2 mg by mouth every 4 (four) hours as needed for severe pain. 1-2 tabs PO Q 3-4 hours PRN   IPRATROPIUM (ATROVENT) 0.02 % NEBULIZER SOLUTION    Take 2.5 mLs (0.5 mg total) by nebulization 3 (three) times daily.   LIDOCAINE (LIDODERM) 5 %    1 patch. To left ant chest wall, 12 hours on/12 hours off, Remove & Discard patch within 12 hours or as directed by MD   LORAZEPAM (ATIVAN) 1 MG TABLET    Take 1 tablet (1 mg total) by mouth every 6 (six) hours as needed for anxiety.   MORPHINE (MS CONTIN) 60 MG 12 HR TABLET    Take 60 mg by mouth every 8 (eight) hours.   OMEPRAZOLE (PRILOSEC) 20 MG CAPSULE    Take 1 capsule (20 mg total) by mouth 2 (two) times daily before a meal.   ONDANSETRON (ZOFRAN) 8 MG TABLET  TAKE 1 TABLET(8 MG) BY MOUTH EVERY 8 HOURS AS NEEDED FOR NAUSEA OR VOMITING   POLYETHYLENE GLYCOL (MIRALAX / GLYCOLAX) PACKET    Take 17 g by mouth daily.   SENNA-DOCUSATE (SENOKOT-S) 8.6-50 MG PER TABLET    Take 1 tablet by mouth at bedtime.  Modified Medications   No medications on file  Discontinued Medications   MORPHINE (MS CONTIN) 30 MG 12 HR TABLET    Take 1 tablet (30 mg total) by mouth every 8 (eight) hours.   MORPHINE (MSIR) 15 MG TABLET    Take 2 tablets (30 mg total) by mouth every 4 (four) hours as needed for severe pain.   POLYETHYLENE GLYCOL (MIRALAX / GLYCOLAX) PACKET    daily. Give 6-8 oz Three times daily for constipation, if BM are more frequent change to BID     Allergies  Allergen Reactions  . Penicillins Rash    On MAR unable to answer  questions     REVIEW OF SYSTEMS:  GENERAL: no change in appetite, no fatigue, no fever, chills or weakness EYES: Denies change in vision, dry eyes, eye pain, itching or discharge EARS: Denies change in hearing, ringing in ears, or earache NOSE: Denies nasal congestion or epistaxis MOUTH and THROAT: Denies oral discomfort, gingival pain or bleeding, pain from teeth or hoarseness   RESPIRATORY: no cough, SOB, DOE, wheezing, hemoptysis CARDIAC: no chest pain, edema or palpitations GI: no abdominal pain, diarrhea, constipation, heart burn, nausea or vomiting GU: Denies dysuria, frequency, hematuria, incontinence, or discharge PSYCHIATRIC:  No report of hallucinations, insomnia, paranoia, or agitation   PHYSICAL EXAMINATION  GENERAL APPEARANCE: Well nourished. In no acute distress.  SKIN:   left hip wound is dry, no erythema HEAD: Normal in size and contour. No evidence of trauma EYES: Lids open and close normally. No blepharitis, entropion or ectropion. PERRL. Conjunctivae are clear and sclerae are white. Lenses are without opacity EARS: Pinnae are normal. Patient hears normal voice tunes of the examiner MOUTH and THROAT: Lips are without lesions. Oral mucosa is moist and without lesions. Tongue is normal in shape, size, and color and without lesions NECK: supple, trachea midline, no neck masses, no thyroid tenderness, no thyromegaly LYMPHATICS: no LAN in the neck, no supraclavicular LAN RESPIRATORY: breathing is even & unlabored, BS CTAB CARDIAC: RRR, no murmur,no extra heart sounds, no edema GI: abdomen soft, normal BS, no masses, no tenderness, no hepatomegaly, no splenomegaly EXTREMITIES:  Able to move 4 extremities; LLE immobilizer PSYCHIATRIC: Alert and oriented X 3. Affect and behavior are appropriate  LABS/RADIOLOGY: Labs reviewed: 03/09/15  WBC 10.3 hemoglobin 9.9 hematocrit 31.4 MCV 79.9 platelet 722 sodium 131 potassium 4.8 glucose 80 BUN 9 creatinine 0.36 calcium  8.1 Basic Metabolic Panel:  Recent Labs  09/16/14 0100  09/19/14 0500  02/11/15 0443  02/13/15 0549 02/14/15 0456 02/24/15 1053 03/02/15 1825  NA 131*  < > 132*  < > 130*  < > 127* 128* 130* 131*  K 2.9*  < > 3.7  < > 3.7  < > 3.5 3.6 3.8 3.1*  CL 93*  < > 94*  < > 101  < > 96* 96*  --  98*  CO2 31  < > 32  < > 27  < > '27 25 23 26  '$ GLUCOSE 93  < > 138*  < > 126*  < > 98 76 120 106*  BUN 8  < > 6  < > 6  < > 6 6 5.4* 15  CREATININE 0.43*  < > 0.40*  < > 0.47  < > 0.37* 0.36* 0.6 0.50  CALCIUM 7.9*  < > 8.2*  < > 8.1*  < > 8.5* 8.6* 9.8 8.1*  MG 1.4*  --  1.6  --  1.5*  --   --   --   --   --   PHOS  --   --  2.3  --   --   --   --   --   --   --   < > = values in this interval not displayed. Liver Function Tests:  Recent Labs  02/09/15 1057 02/10/15 0325 02/24/15 1053  AST 7 9* 12  ALT <6 <5* <6  ALKPHOS 150 137* 165*  BILITOT 0.41 0.5 0.67  PROT 5.5* 5.6* 6.0*  ALBUMIN 2.2* 2.4* 2.6*    Recent Labs  09/10/14 1915  LIPASE 13   CBC:  Recent Labs  02/09/15 1613  02/13/15 0549 02/24/15 1053 03/02/15 1825  WBC 10.6*  < > 9.9 12.6* 10.3  NEUTROABS 9.2*  --   --  11.0* 8.4*  HGB 10.7*  < > 10.0* 12.0 11.0*  HCT 33.6*  < > 30.9* 36.8 34.2*  MCV 77.2*  < > 76.3* 75.9* 77.7*  PLT 564*  < > 414* 975* 632*  < > = values in this interval not displayed.  Cardiac Enzymes:  Recent Labs  09/13/14 1712 09/13/14 2259 09/14/14 0330  TROPONINI <0.03 <0.03 <0.03   CBG:  Recent Labs  10/22/14 1419  GLUCAP 85     Dg Knee 1-2 Views Left  03/02/2015   CLINICAL DATA:  Left thigh pain after fall at nursing home 4 days ago.  EXAM: LEFT KNEE - 1-2 VIEW  COMPARISON:  None.  FINDINGS: There is no evidence of fracture, dislocation, or joint effusion. There is no evidence of arthropathy or other focal bone abnormality. Soft tissues are unremarkable.  IMPRESSION: Negative.   Electronically Signed   By: Marin Olp M.D.   On: 03/02/2015 20:15   Ct Head Wo  Contrast  03/02/2015   CLINICAL DATA:  Fall on Friday. Dislocation of left hip replacement. Possible head injury at the time of fall. History of lung cancer.  EXAM: CT HEAD WITHOUT CONTRAST  TECHNIQUE: Contiguous axial images were obtained from the base of the skull through the vertex without intravenous contrast.  COMPARISON:  10/22/2014  FINDINGS: The brainstem, cerebellum, cerebral peduncles, thalamus, basal ganglia, basilar cisterns, and ventricular system appear within normal limits. No intracranial hemorrhage, mass lesion, or acute CVA.  IMPRESSION: 1. No significant intracranial abnormality is identified. No current evidence of intracranial metastatic disease on today's noncontrast CT head.   Electronically Signed   By: Van Clines M.D.   On: 03/02/2015 19:56   Dg Pelvis Portable  03/03/2015   CLINICAL DATA:  Post close reduction of a dislocated left hip prosthesis.  EXAM: PORTABLE PELVIS 1-2 VIEWS  COMPARISON:  Immediate postoperative AP pelvis x-rays 02/10/2015.  FINDINGS: Anatomic alignment of the bipolar left hip arthroplasty in the AP projection. When compared to the immediate postoperative examination, the acetabular cup has rotated into a more horizontal orientation. No acute fractures. Contralateral right hip joint intact.  IMPRESSION: Anatomic alignment of the left hip prosthesis in the AP projection post reduction. Interval rotation of the acetabular cup into a more horizontal orientation since the immediate postoperative exam 02/10/2015.   Electronically Signed   By: Evangeline Dakin M.D.   On: 03/03/2015  19:10   Dg Hip Operative Unilat With Pelvis Left  03/03/2015   CLINICAL DATA:  Left hip replacement.  EXAM: OPERATIVE LEFT HIP (WITH PELVIS IF PERFORMED) 2 VIEWS  TECHNIQUE: Fluoroscopic spot image(s) were submitted for interpretation post-operatively.  COMPARISON:  03/02/2015.  FINDINGS: Left hip replacement with good anatomic alignment. Hardware intact. No acute abnormality.   IMPRESSION: Left hip replacement with good anatomic alignment . Hardware intact .   Electronically Signed   By: Marcello Moores  Register   On: 03/03/2015 17:24   Dg Hip Unilat With Pelvis Min 4 Views Left  03/02/2015   CLINICAL DATA:  49 year old female with hip pain  EXAM: DG HIP (WITH OR WITHOUT PELVIS) 4+V LEFT  COMPARISON:  Radiograph dated 02/10/2015  FINDINGS: There is a total left hip arthroplasty. There is complete dislocation of the left hip with proximal migration of the left femoral arthroplasty above the acetabulum. No definite acute fracture identified.  IMPRESSION: Dislocated left hip arthroplasty with proximal migration.   Electronically Signed   By: Anner Crete M.D.   On: 03/02/2015 19:26    ASSESSMENT/PLAN:  Left hip dislocation  - S/P closed reduction of left hip dislocation - for home health PT, OT, nursing, CNA and social worker; continue MS Contin 60 mg Q 8 hours;  fentanyl 100 g/hour topically every 3 days, Lidoderm 5% 1 patch transdermally daily,Dilaudid 2 mg 1-2 tabs by mouth every 3- 4 hours when necessary and diclofenac 50 mg EC 1 tab by mouth twice a day for pain; Lovenox 80 mg subcutaneous daily for DVT prophylaxis  Depression -  mood is stable; continue Cymbalta 30 mg 1 capsule by mouth daily  Anemia, acute blood loss - hemoglobin 9.9; continue ferrous sulfate 325 mg 1 tab by mouth twice a day  Anxiety - continue Ativan 1 mg 1 tab by mouth every 6 hours when necessary  GERD - continue Prilosec 20 mg 1 capsule by mouth twice a day  Constipation - continue MiraLAX 17 mg by mouth 3 times a day and senna S 1 tab by mouth daily at bedtime  Lung cancer - end-stage; for palliative consult with [facility]  Protein calorie malnutrition, severe - albumin 2.4; continue supplementation       I have filled out patient's discharge paperwork and written prescriptions.  Patient will receive home health PT, OT, SW, Nursing and CNA.  DME provided:  16" X 18" standard wheelchair, brake  extensions and cushion  Total discharge time: Greater than 30 minutes  Discharge time involved coordination of the discharge process with social worker, nursing staff and therapy department. Medical justification for home health services/DME verified.   [facility], NP [employer] [phone]

## 2015-03-25 ENCOUNTER — Other Ambulatory Visit: Payer: Self-pay | Admitting: Adult Health

## 2015-03-26 ENCOUNTER — Emergency Department (HOSPITAL_COMMUNITY): Payer: 59 | Admitting: Certified Registered"

## 2015-03-26 ENCOUNTER — Encounter (HOSPITAL_COMMUNITY): Payer: Self-pay | Admitting: Emergency Medicine

## 2015-03-26 ENCOUNTER — Emergency Department (HOSPITAL_COMMUNITY): Payer: 59

## 2015-03-26 ENCOUNTER — Ambulatory Visit (HOSPITAL_COMMUNITY)
Admission: EM | Admit: 2015-03-26 | Discharge: 2015-03-26 | Disposition: A | Discharge status: Home / Self Care | Payer: 59 | Attending: Emergency Medicine | Admitting: Emergency Medicine

## 2015-03-26 ENCOUNTER — Encounter (HOSPITAL_COMMUNITY)
Admission: EM | Disposition: A | Discharge status: Home / Self Care | Payer: Self-pay | Source: Home / Self Care | Attending: Emergency Medicine

## 2015-03-26 DIAGNOSIS — Y838 Other surgical procedures as the cause of abnormal reaction of the patient, or of later complication, without mention of misadventure at the time of the procedure: Secondary | ICD-10-CM | POA: Diagnosis not present

## 2015-03-26 DIAGNOSIS — C3490 Malignant neoplasm of unspecified part of unspecified bronchus or lung: Secondary | ICD-10-CM | POA: Diagnosis not present

## 2015-03-26 DIAGNOSIS — Z79891 Long term (current) use of opiate analgesic: Secondary | ICD-10-CM | POA: Diagnosis not present

## 2015-03-26 DIAGNOSIS — Z87891 Personal history of nicotine dependence: Secondary | ICD-10-CM | POA: Diagnosis not present

## 2015-03-26 DIAGNOSIS — Z96642 Presence of left artificial hip joint: Secondary | ICD-10-CM | POA: Insufficient documentation

## 2015-03-26 DIAGNOSIS — T84021A Dislocation of internal left hip prosthesis, initial encounter: Secondary | ICD-10-CM | POA: Diagnosis not present

## 2015-03-26 DIAGNOSIS — Z7901 Long term (current) use of anticoagulants: Secondary | ICD-10-CM | POA: Insufficient documentation

## 2015-03-26 DIAGNOSIS — S73005S Unspecified dislocation of left hip, sequela: Secondary | ICD-10-CM

## 2015-03-26 DIAGNOSIS — Z79899 Other long term (current) drug therapy: Secondary | ICD-10-CM | POA: Diagnosis not present

## 2015-03-26 DIAGNOSIS — T148XXA Other injury of unspecified body region, initial encounter: Secondary | ICD-10-CM

## 2015-03-26 DIAGNOSIS — Z880 Allergy status to penicillin: Secondary | ICD-10-CM | POA: Diagnosis not present

## 2015-03-26 DIAGNOSIS — T148 Other injury of unspecified body region: Secondary | ICD-10-CM | POA: Diagnosis present

## 2015-03-26 HISTORY — PX: HIP CLOSED REDUCTION: SHX983

## 2015-03-26 LAB — BASIC METABOLIC PANEL
ANION GAP: 8 (ref 5–15)
BUN: 13 mg/dL (ref 6–20)
CHLORIDE: 97 mmol/L — AB (ref 101–111)
CO2: 23 mmol/L (ref 22–32)
Calcium: 8.5 mg/dL — ABNORMAL LOW (ref 8.9–10.3)
Creatinine, Ser: 0.58 mg/dL (ref 0.44–1.00)
GFR calc Af Amer: >60 mL/min (ref >60)
GFR calc non Af Amer: >60 mL/min (ref >60)
GLUCOSE: 109 mg/dL — AB (ref 65–99)
POTASSIUM: 3.7 mmol/L (ref 3.5–5.1)
Sodium: 128 mmol/L — ABNORMAL LOW (ref 135–145)

## 2015-03-26 LAB — CBC WITH DIFFERENTIAL/PLATELET
BASOS ABS: 0 10*3/uL (ref 0.0–0.1)
Basophils Relative: 0 %
Eosinophils Absolute: 0.2 10*3/uL (ref 0.0–0.7)
Eosinophils Relative: 2 %
HEMATOCRIT: 36.2 % (ref 36.0–46.0)
HEMOGLOBIN: 11.7 g/dL — AB (ref 12.0–15.0)
LYMPHS PCT: 4 %
Lymphs Abs: 0.5 10*3/uL — ABNORMAL LOW (ref 0.7–4.0)
MCH: 25.3 pg — ABNORMAL LOW (ref 26.0–34.0)
MCHC: 32.3 g/dL (ref 30.0–36.0)
MCV: 78.4 fL (ref 78.0–100.0)
MONO ABS: 0.9 10*3/uL (ref 0.1–1.0)
Monocytes Relative: 8 %
NEUTROS ABS: 10.2 10*3/uL — AB (ref 1.7–7.7)
NEUTROS PCT: 86 %
Platelets: 643 10*3/uL — ABNORMAL HIGH (ref 150–400)
RBC: 4.62 MIL/uL (ref 3.87–5.11)
RDW: 18.4 % — AB (ref 11.5–15.5)
WBC: 11.8 10*3/uL — ABNORMAL HIGH (ref 4.0–10.5)

## 2015-03-26 SURGERY — CLOSED REDUCTION, HIP
Anesthesia: General | Site: Hip | Laterality: Left

## 2015-03-26 MED ORDER — SUCCINYLCHOLINE CHLORIDE 20 MG/ML IJ SOLN
INTRAMUSCULAR | Status: DC | PRN
  Administered 2015-03-26: 100 mg via INTRAVENOUS
  Administered 2015-03-26: 40 mg via INTRAVENOUS
  Administered 2015-03-26: 60 mg via INTRAVENOUS

## 2015-03-26 MED ORDER — LACTATED RINGERS IV SOLN
INTRAVENOUS | Status: DC | PRN
  Administered 2015-03-26: 03:00:00 via INTRAVENOUS

## 2015-03-26 MED ORDER — PHENYLEPHRINE HCL 10 MG/ML IJ SOLN
INTRAMUSCULAR | Status: DC | PRN
  Administered 2015-03-26: 80 ug via INTRAVENOUS
  Administered 2015-03-26: 40 ug via INTRAVENOUS

## 2015-03-26 MED ORDER — SUFENTANIL CITRATE 50 MCG/ML IV SOLN
INTRAVENOUS | Status: AC
  Filled 2015-03-26: qty 1

## 2015-03-26 MED ORDER — HYDROMORPHONE HCL 1 MG/ML IJ SOLN
1.0000 mg | Freq: Once | INTRAMUSCULAR | Status: AC
  Administered 2015-03-26: 1 mg via INTRAVENOUS
  Filled 2015-03-26: qty 1

## 2015-03-26 MED ORDER — MIDAZOLAM HCL 2 MG/2ML IJ SOLN
INTRAMUSCULAR | Status: AC
  Filled 2015-03-26: qty 4

## 2015-03-26 MED ORDER — SODIUM CHLORIDE 0.9 % IV BOLUS (SEPSIS)
1000.0000 mL | Freq: Once | INTRAVENOUS | Status: AC
  Administered 2015-03-26: 1000 mL via INTRAVENOUS

## 2015-03-26 MED ORDER — KETAMINE HCL 10 MG/ML IJ SOLN
2.0000 mg/kg | Freq: Once | INTRAMUSCULAR | Status: DC
  Filled 2015-03-26: qty 9.3

## 2015-03-26 MED ORDER — PROPOFOL 10 MG/ML IV BOLUS
INTRAVENOUS | Status: AC
  Filled 2015-03-26: qty 20

## 2015-03-26 MED ORDER — LIDOCAINE HCL (CARDIAC) 20 MG/ML IV SOLN
INTRAVENOUS | Status: AC
Start: 2015-03-26 — End: 2015-03-26
  Filled 2015-03-26: qty 5

## 2015-03-26 MED ORDER — SUCCINYLCHOLINE CHLORIDE 20 MG/ML IJ SOLN
INTRAMUSCULAR | Status: AC
  Filled 2015-03-26: qty 1

## 2015-03-26 MED ORDER — PROPOFOL 10 MG/ML IV BOLUS
INTRAVENOUS | Status: DC | PRN
  Administered 2015-03-26: 50 mg via INTRAVENOUS
  Administered 2015-03-26: 100 mg via INTRAVENOUS
  Administered 2015-03-26: 50 mg via INTRAVENOUS

## 2015-03-26 MED ORDER — MIDAZOLAM HCL 2 MG/2ML IJ SOLN
INTRAMUSCULAR | Status: DC | PRN
  Administered 2015-03-26: 2 mg via INTRAVENOUS

## 2015-03-26 SURGICAL SUPPLY — 2 items
BNDG COHESIVE 6X5 TAN STRL LF (GAUZE/BANDAGES/DRESSINGS) ×3 IMPLANT
IMMOBILIZER KNEE 22 (SOFTGOODS) ×3 IMPLANT

## 2015-03-26 NOTE — Brief Op Note (Signed)
03/26/2015  4:09 AM  PATIENT:  [name]  49 y.o. female  PRE-OPERATIVE DIAGNOSIS:  Dislocated left hip  POST-OPERATIVE DIAGNOSIS:  Dislocated left hip  PROCEDURE:  Procedure(s): CLOSED REDUCTION HIP (Left)  SURGEON:  Surgeon(s) and Role:    * Marybelle Killings, MD - Primary  PHYSICIAN ASSISTANT:   ASSISTANTS: none   ANESTHESIA:   general  EBL:     BLOOD ADMINISTERED:none  DRAINS: none   LOCAL MEDICATIONS USED:  NONE  SPECIMEN:  No Specimen  DISPOSITION OF SPECIMEN:  N/A  COUNTS:  YES  TOURNIQUET:  * No tourniquets in log *  DICTATION: .Other Dictation: Dictation Number [id number]  PLAN OF CARE: Discharge to home after PACU  PATIENT DISPOSITION:  PACU - hemodynamically stable.   Delay start of Pharmacological VTE agent (>24hrs) due to surgical blood loss or risk of bleeding: not applicable

## 2015-03-26 NOTE — Anesthesia Preprocedure Evaluation (Addendum)
Anesthesia Evaluation  Patient identified by MRN, date of birth, ID band Patient awake    Reviewed: Allergy & Precautions, NPO status , Patient's Chart, lab work & pertinent test results  History of Anesthesia Complications Negative for: history of anesthetic complications  Airway Mallampati: II  TM Distance: >3 FB Neck ROM: Full    Dental  (+) Dental Advisory Given, Poor Dentition   Pulmonary former smoker,  Lung cancer with extension to chest wall: XRT   breath sounds clear to auscultation       Cardiovascular negative cardio ROS   Rhythm:Regular Rate:Normal  3/16 ECHO: EF 60%, valves OK   Neuro/Psych    GI/Hepatic negative GI ROS, Elevated LFTs   Endo/Other  negative endocrine ROS  Renal/GU negative Renal ROS     Musculoskeletal Dislocated THR   Abdominal   Peds  Hematology negative hematology ROS (+)   Anesthesia Other Findings   Reproductive/Obstetrics                             Anesthesia Physical  Anesthesia Plan  ASA: IV  Anesthesia Plan: General   Post-op Pain Management:    Induction: Intravenous  Airway Management Planned: Oral ETT  Additional Equipment:   Intra-op Plan:   Post-operative Plan: Extubation in OR  Informed Consent: I have reviewed the patients History and Physical, chart, labs and discussed the procedure including the risks, benefits and alternatives for the proposed anesthesia with the patient or authorized representative who has indicated his/her understanding and acceptance.   Dental advisory given  Plan Discussed with: CRNA and Surgeon  Anesthesia Plan Comments: (Plan routine monitors, GETA)        Anesthesia Quick Evaluation

## 2015-03-26 NOTE — Discharge Instructions (Signed)
Keep knee immobilizer on except to bath leg. Keep it centered on the knee .

## 2015-03-26 NOTE — Op Note (Signed)
NAME:  [name], [name] NO.:  [id number]  MEDICAL RECORD NO.:  [id number]  LOCATION:  [facility]                         FACILITY:  [facility]  PHYSICIAN:  Mark C. Lorin Mercy, M.D.    DATE OF BIRTH:  08-05-1965  DATE OF PROCEDURE:  03/26/2015 DATE OF DISCHARGE:                              OPERATIVE REPORT   PREOPERATIVE DIAGNOSIS:  Left hip hemiarthroplasty dislocation.  POSTOPERATIVE DIAGNOSIS:  Left hip hemiarthroplasty dislocation.  PROCEDURE:  Closed reduction under general anesthesia, left hip hemiarthroplasty.  SURGEON:  Mark C. Lorin Mercy, M.D.  ANESTHESIA:  General.  HISTORY:  This is a 49 year old female with a lung cancer undergoing radiation.  She is on multiple pain medications including fentanyl, Percocet, and morphine.  She was in bed and woke up with a dislocated hip.  She dislocated her hip 1 week ago and attempts in the ER were unsuccessful and required general anesthesia.  DESCRIPTION OF PROCEDURE:  After induction of general anesthesia, attempted reduction with the patient on a stretcher after succinylcholine was given to the patient with the hip at 90:90 and a counter pressure applied to the pelvis was unsuccessful.  Longitudinal traction was tried and was unsuccessful.  The patient was then placed on the Hana table and with the re-dose of succinylcholine, the hip was distracted using the table and then externally rotated, popped in, and C- arm showed that it was reduced.  The patient was then placed in a knee immobilizer.  LMA tube was removed, and she was sent to the recovery room, will be discharged home not admitted.     Mark C. Lorin Mercy, M.D.     MCY/MEDQ  D:  03/26/2015  T:  03/26/2015  Job:  [id number]

## 2015-03-26 NOTE — Discharge Summary (Signed)
  Pt underwent closed reduction of left bipolar hemiarthroplasty under general anesthesia and discharged home. Never admitted.

## 2015-03-26 NOTE — H&P (Signed)
[name] is an 49 y.o. female.   Chief Complaint:  HPI: 49 year old female with aggressive lung cancer undergoing radiation was in bed and woke up with the dislocated the left hip hemiarthroplasty. Original procedure was 02/10/15 by Dr. Ninfa Linden. She dislocated 03/03/2015 and underwent reduction under general anesthesia with some difficulty. X-ray in the emergency room demonstrated a posterior dislocation of her left bipolar hemiarthroplasty.  Past Medical History  Diagnosis Date  . Cancer ([place])     left bronchioles  . Radiation 11/24/14-12/05/14    Left ribs 30 Gy in 10 fractions    Past Surgical History  Procedure Laterality Date  . Esophagogastroduodenoscopy N/A 09/14/2014    Procedure: ESOPHAGOGASTRODUODENOSCOPY (EGD);  Surgeon: Teena Irani, MD;  Location: [facility];  Service: Endoscopy;  Laterality: N/A;  . Total hip arthroplasty Left 02/10/2015    Procedure:   LEFT HIP BIPOLAR HEMI ARTHROPLASTY;  Surgeon: Mcarthur Rossetti, MD;  Location: [facility];  Service: Orthopedics;  Laterality: Left;  . Hip closed reduction Left 03/03/2015    Procedure: CLOSED MANIPULATION HIP;  Surgeon: Mcarthur Rossetti, MD;  Location: [facility];  Service: Orthopedics;  Laterality: Left;  closed reduction left hip with intraprocedural incision repair and dressing    Family History  Problem Relation Age of Onset  . Cancer Mother   . Heart failure Father    Social History:  reports that she quit smoking about 6 months ago. She does not have any smokeless tobacco history on file. She reports that she does not drink alcohol or use illicit drugs.  Allergies:  Allergies  Allergen Reactions  . Penicillins Rash    On MAR unable to answer questions     (Not in a hospital admission)  Results for orders placed or performed during the hospital encounter of 03/26/15 (from the past 48 hour(s))  CBC with Differential/Platelet     Status: Abnormal   Collection Time: 03/26/15  1:53 AM  Result Value  Ref Range   WBC 11.8 (H) 4.0 - 10.5 K/uL   RBC 4.62 3.87 - 5.11 MIL/uL   Hemoglobin 11.7 (L) 12.0 - 15.0 g/dL   HCT 36.2 36.0 - 46.0 %   MCV 78.4 78.0 - 100.0 fL   MCH 25.3 (L) 26.0 - 34.0 pg   MCHC 32.3 30.0 - 36.0 g/dL   RDW 18.4 (H) 11.5 - 15.5 %   Platelets 643 (H) 150 - 400 K/uL   Neutrophils Relative % 86 %   Neutro Abs 10.2 (H) 1.7 - 7.7 K/uL   Lymphocytes Relative 4 %   Lymphs Abs 0.5 (L) 0.7 - 4.0 K/uL   Monocytes Relative 8 %   Monocytes Absolute 0.9 0.1 - 1.0 K/uL   Eosinophils Relative 2 %   Eosinophils Absolute 0.2 0.0 - 0.7 K/uL   Basophils Relative 0 %   Basophils Absolute 0.0 0.0 - 0.1 K/uL  Basic metabolic panel     Status: Abnormal   Collection Time: 03/26/15  1:53 AM  Result Value Ref Range   Sodium 128 (L) 135 - 145 mmol/L   Potassium 3.7 3.5 - 5.1 mmol/L   Chloride 97 (L) 101 - 111 mmol/L   CO2 23 22 - 32 mmol/L   Glucose, Bld 109 (H) 65 - 99 mg/dL   BUN 13 6 - 20 mg/dL   Creatinine, Ser 0.58 0.44 - 1.00 mg/dL   Calcium 8.5 (L) 8.9 - 10.3 mg/dL   GFR calc non Af Amer >60 >60 mL/min   GFR  calc Af Amer >60 >60 mL/min    Comment: (NOTE) The eGFR has been calculated using the CKD EPI equation. This calculation has not been validated in all clinical situations. eGFR's persistently <60 mL/min signify possible Chronic Kidney Disease.    Anion gap 8 5 - 15   Dg Hip Unilat With Pelvis 2-3 Views Left  03/26/2015   CLINICAL DATA:  Head physical rehab for hip today. May have dislocated hip back at home.  EXAM: DG HIP (WITH OR WITHOUT PELVIS) 2-3V LEFT  COMPARISON:  None.  FINDINGS: There is superior dislocation of the bipolar left hip hemiarthroplasty. No fracture is evident.  IMPRESSION: Dislocation of the left hip prosthesis.   Electronically Signed   By: Andreas Newport M.D.   On: 03/26/2015 02:39    Review of Systems  Constitutional: Positive for weight loss and malaise/fatigue. Negative for fever and chills.  HENT: Negative.   Eyes: Negative.    Gastrointestinal:       Prev endoscopy dr Amedeo Plenty  Skin: Negative.   Neurological: Positive for weakness.    Blood pressure 96/58, pulse 93, temperature 98.7 F (37.1 C), temperature source Oral, resp. rate 18, height $RemoveBe'5\' 7"'MvDXoKysj$  (1.702 m), weight 46.267 kg (102 lb), SpO2 100 %. Physical Exam  Constitutional: She is oriented to person, place, and time.  Thin elderly cachectic, pleasant cooperative  HENT:  Head: Normocephalic and atraumatic.  Neck: Normal range of motion.  Cardiovascular: Normal rate.   Respiratory: Effort normal.  GI: Soft.  Musculoskeletal:  Left lower extremity short and internally rotated   Neurological: She is alert and oriented to person, place, and time.  Skin: Skin is warm and dry.  Psychiatric: She has a normal mood and affect. Her behavior is normal. Thought content normal.     Assessment/Plan Left bipolar hip dislocation. She is on Lovenox daily injections. She also is on a Duragesic patch and also takes morphine. She also uses oxycodone 15 mg. This patient's high risk due to primary lung tumor, previous hip dehiscence with the attempted close reduction. With patient being on Lovenox will attempt closed reduction only and if it is unsuccessful then she can the be admitted and undergo open procedure with the Dr. Ninfa Linden at a later time. This was discussed with patient in detail. Previous attempted reduction under conscious sedation the emergency room was unsuccessful. Last procedure patient had be placed on the [facility]  table we will have this available.  Cayton Cuevas C 03/26/2015, 3:03 AM

## 2015-03-26 NOTE — Anesthesia Procedure Notes (Signed)
Procedure Name: LMA Insertion Date/Time: 03/26/2015 3:56 AM Performed by: Claris Che Pre-anesthesia Checklist: Patient identified, Emergency Drugs available, Suction available, Patient being monitored and Timeout performed Patient Re-evaluated:Patient Re-evaluated prior to inductionOxygen Delivery Method: Circle system utilized Preoxygenation: Pre-oxygenation with 100% oxygen Intubation Type: IV induction Ventilation: Mask ventilation without difficulty LMA: LMA inserted LMA Size: 4.0 Number of attempts: 1 Placement Confirmation: positive ETCO2 and breath sounds checked- equal and bilateral Dental Injury: Teeth and Oropharynx as per pre-operative assessment

## 2015-03-26 NOTE — ED Provider Notes (Signed)
CSN: [id number]   Arrival date & time 03/26/15 0132  History  By signing my name below, I, Altamease Oiler, attest that this documentation has been prepared under the direction and in the presence of Everlene Balls, MD. Electronically Signed: Altamease Oiler, ED Scribe. 03/26/2015. 2:24 AM.  Chief Complaint  Patient presents with  . Hip Pain    HPI The history is provided by the patient. No language interpreter was used.   Brought in by EMS, [name] is a 49 y.o. female with PMH of metastatic non-small cell lung cancer, squamous cell carcinoma s/p chemoradiation who presents to the Emergency Department complaining of constant and severe left hip pain with sudden onset tonight. The pt was sleeping when her cat jumped on her bed waking her. When she woke up she noticed that the pain was present and that she was unable to move the left hip. Associated symptoms include left hip deformity. Pt states that over the last month she has had 2 left hip dislocations and 1 partial replacement by Dr. Ninfa Linden. She was discharged from rehab around noon today after treatment for a dislocation.   Past Medical History  Diagnosis Date  . Cancer ([place])     left bronchioles  . Radiation 11/24/14-12/05/14    Left ribs 30 Gy in 10 fractions    Past Surgical History  Procedure Laterality Date  . Esophagogastroduodenoscopy N/A 09/14/2014    Procedure: ESOPHAGOGASTRODUODENOSCOPY (EGD);  Surgeon: Teena Irani, MD;  Location: [facility];  Service: Endoscopy;  Laterality: N/A;  . Total hip arthroplasty Left 02/10/2015    Procedure:   LEFT HIP BIPOLAR HEMI ARTHROPLASTY;  Surgeon: Mcarthur Rossetti, MD;  Location: [facility];  Service: Orthopedics;  Laterality: Left;  . Hip closed reduction Left 03/03/2015    Procedure: CLOSED MANIPULATION HIP;  Surgeon: Mcarthur Rossetti, MD;  Location: [place];  Service: Orthopedics;  Laterality: Left;  closed reduction left hip with intraprocedural incision repair and  dressing    Family History  Problem Relation Age of Onset  . Cancer Mother   . Heart failure Father     Social History  Substance Use Topics  . Smoking status: Former Smoker    Quit date: 09/07/2014  . Smokeless tobacco: None  . Alcohol Use: No     Review of Systems 10 Systems reviewed and all are negative for acute change except as noted in the HPI. Home Medications   Prior to Admission medications   Medication Sig Start Date End Date Taking? Authorizing Provider  acetaminophen (TYLENOL) 325 MG tablet Take 2 tablets (650 mg total) by mouth every 6 (six) hours as needed for mild pain (or Fever >/= 101). 09/30/14   Bonnielee Haff, MD  DULoxetine (CYMBALTA) 30 MG capsule Take 30 mg by mouth daily.  12/05/14   Historical Provider, MD  enoxaparin (LOVENOX) 80 MG/0.8ML injection ADMINISTER 0.8 ML UNDER THE SKIN DAILY 01/26/15   Maryanna Shape, NP  fentaNYL (DURAGESIC - DOSED MCG/HR) 100 MCG/HR Place 1 patch (100 mcg total) onto the skin every 3 (three) days. 03/05/15   Mcarthur Rossetti, MD  ferrous sulfate 325 (65 FE) MG EC tablet Take 1 tablet (325 mg total) by mouth 2 (two) times daily with a meal. 01/26/15   Maryanna Shape, NP  HYDROmorphone (DILAUDID) 2 MG tablet Take 2 mg by mouth every 4 (four) hours as needed for severe pain. 1-2 tabs PO Q 3-4 hours PRN    Historical Provider, MD  ipratropium (ATROVENT)  0.02 % nebulizer solution Take 2.5 mLs (0.5 mg total) by nebulization 3 (three) times daily. 11/18/14   Maryanna Shape, NP  lidocaine (LIDODERM) 5 % 1 patch. To left ant chest wall, 12 hours on/12 hours off, Remove & Discard patch within 12 hours or as directed by MD    Historical Provider, MD  LORazepam (ATIVAN) 1 MG tablet Take 1 tablet (1 mg total) by mouth every 6 (six) hours as needed for anxiety. 02/24/15   Estill Dooms, MD  morphine (MS CONTIN) 60 MG 12 hr tablet Take 60 mg by mouth every 8 (eight) hours.    Historical Provider, MD  omeprazole (PRILOSEC) 20 MG capsule Take  1 capsule (20 mg total) by mouth 2 (two) times daily before a meal. 09/30/14   Bonnielee Haff, MD  ondansetron (ZOFRAN) 8 MG tablet TAKE 1 TABLET(8 MG) BY MOUTH EVERY 8 HOURS AS NEEDED FOR NAUSEA OR VOMITING 11/26/14   Maryanna Shape, NP  polyethylene glycol (MIRALAX / GLYCOLAX) packet Take 17 g by mouth daily.    Historical Provider, MD  senna-docusate (SENOKOT-S) 8.6-50 MG per tablet Take 1 tablet by mouth at bedtime. 09/30/14   Bonnielee Haff, MD    Allergies  Penicillins  Triage Vitals: BP 96/58 mmHg  Pulse 93  Temp(Src) 98.7 F (37.1 C) (Oral)  Resp 18  Ht '5\' 7"'$  (1.702 m)  Wt 102 lb (46.267 kg)  BMI 15.97 kg/m2  SpO2 100%  Physical Exam  Constitutional: She is oriented to person, place, and time. She appears well-developed and well-nourished. She appears distressed.  HENT:  Head: Normocephalic and atraumatic.  Nose: Nose normal.  Mouth/Throat: Oropharynx is clear and moist. No oropharyngeal exudate.  Eyes: Conjunctivae and EOM are normal. Pupils are equal, round, and reactive to light. No scleral icterus.  Neck: Normal range of motion. Neck supple. No JVD present. No tracheal deviation present. No thyromegaly present.  Cardiovascular: Normal rate, regular rhythm and normal heart sounds.  Exam reveals no gallop and no friction rub.   No murmur heard. Pulmonary/Chest: Effort normal and breath sounds normal. No respiratory distress. She has no wheezes. She exhibits no tenderness.  Abdominal: Soft. Bowel sounds are normal. She exhibits no distension and no mass. There is no tenderness. There is no rebound and no guarding.  Musculoskeletal:  Obvious deformity to left hip with normal pulses and sensation distally  Lymphadenopathy:    She has no cervical adenopathy.  Neurological: She is alert and oriented to person, place, and time. No cranial nerve deficit. She exhibits normal muscle tone.  Skin: Skin is warm and dry. No rash noted. No erythema. No pallor.  Nursing note and vitals  reviewed.   ED Course  Procedures   DIAGNOSTIC STUDIES: Oxygen Saturation is 100% on RA, normal by my interpretation.    COORDINATION OF CARE: 1:37 AM Discussed treatment plan which includes left hip/pelvis XR, lab work, and pain management with pt at bedside and pt agreed to plan.  2:21 AM-Consult complete with Dr. Lorin Mercy (Orthopedic Surgery). Patient case explained and discussed. Agrees to admit patient for further evaluation and treatment. Call ended at 2:23 AM   Labs Reviewed  CBC WITH DIFFERENTIAL/PLATELET  BASIC METABOLIC PANEL    Imaging Review No results found.  I personally reviewed and evaluated these images and lab results as a part of my medical decision-making.   MDM   Final diagnoses:  None   Patient presents to the ED for hip pain and has an obvious  deformity concerning for dislocation.  She states they attempted 4x in the ED and she had to go to the OR for reduction.  She is requesting I speak with Dr. Ninfa Linden prior to any attempts.  He has been paged.  Patient given dilaudid for pain control.  Xray reveals dislocation.  I spoke with Dr. Lorin Mercy who is on call, he states he will come in to reduce the joint in the OR.   I, Aryanna Shaver, personally performed the services described in this documentation. All medical record entries made by the scribe were at my direction and in my presence.  I have reviewed the chart and discharge instructions and agree that the record reflects my personal performance and is accurate and complete. Marlen Mollica.  03/26/2015. 2:16 AM.     Everlene Balls, MD 03/26/15 [phone]

## 2015-03-26 NOTE — Transfer of Care (Signed)
Immediate Anesthesia Transfer of Care Note  Patient: [name]  Procedure(s) Performed: Procedure(s): CLOSED REDUCTION HIP (Left)  Patient Location: PACU  Anesthesia Type:General  Level of Consciousness: sedated and patient cooperative  Airway & Oxygen Therapy: Patient Spontanous Breathing and Patient connected to nasal cannula oxygen  Post-op Assessment: Report given to RN and Post -op Vital signs reviewed and stable  Post vital signs: Reviewed and stable  Last Vitals:  Filed Vitals:   03/26/15 0430  BP: 92/49  Pulse: 85  Temp:   Resp: 14    Complications: No apparent anesthesia complications

## 2015-03-26 NOTE — ED Notes (Signed)
Pt presents with [facility] for obvious hip dislocation; pt reports cat jumped on bed and woke pt up; pt reports discharge from rehab yesterday at noon for same (and partial hip replacement); pt also has hx of lung cancer; pt wearing 120mg fentanyl patch placed yesterday; pt given 254m fentanyl intranasally by EMS enroute; pt CAOx4 at this time; VSS

## 2015-03-27 ENCOUNTER — Encounter (HOSPITAL_COMMUNITY): Payer: Self-pay | Admitting: Orthopaedic Surgery

## 2015-03-29 NOTE — Anesthesia Postprocedure Evaluation (Signed)
Anesthesia Post Note  Patient: [name]  Procedure(s) Performed: Procedure(s) (LRB): CLOSED REDUCTION HIP (Left)  Anesthesia type: General  Patient location: PACU  Post pain: Pain level controlled  Post assessment: Post-op Vital signs reviewed  Last Vitals: BP 72/46 mmHg  Pulse 88  Temp(Src) 37 C (Oral)  Resp 22  Ht '5\' 7"'$  (1.702 m)  Wt 102 lb (46.267 kg)  BMI 15.97 kg/m2  SpO2 98%  Post vital signs: Reviewed  Level of consciousness: sedated  Complications: No apparent anesthesia complications

## 2015-03-30 ENCOUNTER — Inpatient Hospital Stay (HOSPITAL_COMMUNITY): Payer: 59

## 2015-03-30 ENCOUNTER — Inpatient Hospital Stay (HOSPITAL_COMMUNITY)
Admission: EM | Admit: 2015-03-30 | Discharge: 2015-04-04 | DRG: 467 | Disposition: A | Discharge status: Home Health | Payer: 59 | Attending: Orthopaedic Surgery | Admitting: Orthopaedic Surgery

## 2015-03-30 ENCOUNTER — Encounter (HOSPITAL_COMMUNITY): Payer: Self-pay | Admitting: Emergency Medicine

## 2015-03-30 ENCOUNTER — Emergency Department (HOSPITAL_COMMUNITY): Payer: 59

## 2015-03-30 DIAGNOSIS — T84029A Dislocation of unspecified internal joint prosthesis, initial encounter: Secondary | ICD-10-CM

## 2015-03-30 DIAGNOSIS — Z880 Allergy status to penicillin: Secondary | ICD-10-CM

## 2015-03-30 DIAGNOSIS — Z79899 Other long term (current) drug therapy: Secondary | ICD-10-CM

## 2015-03-30 DIAGNOSIS — Z87891 Personal history of nicotine dependence: Secondary | ICD-10-CM

## 2015-03-30 DIAGNOSIS — T84029S Dislocation of unspecified internal joint prosthesis, sequela: Secondary | ICD-10-CM

## 2015-03-30 DIAGNOSIS — Y838 Other surgical procedures as the cause of abnormal reaction of the patient, or of later complication, without mention of misadventure at the time of the procedure: Secondary | ICD-10-CM | POA: Diagnosis present

## 2015-03-30 DIAGNOSIS — C7989 Secondary malignant neoplasm of other specified sites: Secondary | ICD-10-CM | POA: Diagnosis present

## 2015-03-30 DIAGNOSIS — Z96649 Presence of unspecified artificial hip joint: Secondary | ICD-10-CM

## 2015-03-30 DIAGNOSIS — S32409A Unspecified fracture of unspecified acetabulum, initial encounter for closed fracture: Secondary | ICD-10-CM

## 2015-03-30 DIAGNOSIS — S73005A Unspecified dislocation of left hip, initial encounter: Secondary | ICD-10-CM

## 2015-03-30 DIAGNOSIS — R64 Cachexia: Secondary | ICD-10-CM | POA: Diagnosis present

## 2015-03-30 DIAGNOSIS — D62 Acute posthemorrhagic anemia: Secondary | ICD-10-CM | POA: Diagnosis not present

## 2015-03-30 DIAGNOSIS — Z419 Encounter for procedure for purposes other than remedying health state, unspecified: Secondary | ICD-10-CM

## 2015-03-30 DIAGNOSIS — R0602 Shortness of breath: Secondary | ICD-10-CM

## 2015-03-30 DIAGNOSIS — S73005S Unspecified dislocation of left hip, sequela: Secondary | ICD-10-CM

## 2015-03-30 DIAGNOSIS — C3490 Malignant neoplasm of unspecified part of unspecified bronchus or lung: Secondary | ICD-10-CM | POA: Diagnosis present

## 2015-03-30 DIAGNOSIS — Z79891 Long term (current) use of opiate analgesic: Secondary | ICD-10-CM | POA: Diagnosis not present

## 2015-03-30 DIAGNOSIS — R1310 Dysphagia, unspecified: Secondary | ICD-10-CM

## 2015-03-30 DIAGNOSIS — T84021A Dislocation of internal left hip prosthesis, initial encounter: Principal | ICD-10-CM | POA: Diagnosis present

## 2015-03-30 DIAGNOSIS — Z923 Personal history of irradiation: Secondary | ICD-10-CM | POA: Diagnosis not present

## 2015-03-30 DIAGNOSIS — Z96642 Presence of left artificial hip joint: Secondary | ICD-10-CM

## 2015-03-30 DIAGNOSIS — Z809 Family history of malignant neoplasm, unspecified: Secondary | ICD-10-CM | POA: Diagnosis not present

## 2015-03-30 LAB — CBC
HEMATOCRIT: 32.5 % — AB (ref 36.0–46.0)
HEMOGLOBIN: 10.4 g/dL — AB (ref 12.0–15.0)
MCH: 25.7 pg — AB (ref 26.0–34.0)
MCHC: 32 g/dL (ref 30.0–36.0)
MCV: 80.2 fL (ref 78.0–100.0)
Platelets: 608 10*3/uL — ABNORMAL HIGH (ref 150–400)
RBC: 4.05 MIL/uL (ref 3.87–5.11)
RDW: 19.2 % — ABNORMAL HIGH (ref 11.5–15.5)
WBC: 9.4 10*3/uL (ref 4.0–10.5)

## 2015-03-30 LAB — BASIC METABOLIC PANEL
ANION GAP: 8 (ref 5–15)
BUN: 9 mg/dL (ref 6–20)
CALCIUM: 8.5 mg/dL — AB (ref 8.9–10.3)
CO2: 24 mmol/L (ref 22–32)
Chloride: 100 mmol/L — ABNORMAL LOW (ref 101–111)
Creatinine, Ser: 0.51 mg/dL (ref 0.44–1.00)
GFR calc Af Amer: >60 mL/min (ref >60)
GLUCOSE: 79 mg/dL (ref 65–99)
Potassium: 3.9 mmol/L (ref 3.5–5.1)
Sodium: 132 mmol/L — ABNORMAL LOW (ref 135–145)

## 2015-03-30 MED ORDER — FENTANYL 100 MCG/HR TD PT72
100.0000 ug | MEDICATED_PATCH | TRANSDERMAL | Status: DC
  Administered 2015-03-30 – 2015-04-02 (×2): 100 ug via TRANSDERMAL
  Filled 2015-03-30 (×3): qty 1
  Filled 2015-03-30: qty 4

## 2015-03-30 MED ORDER — HYDROMORPHONE HCL 1 MG/ML IJ SOLN
1.0000 mg | Freq: Once | INTRAMUSCULAR | Status: AC
  Administered 2015-03-30: 1 mg via INTRAVENOUS
  Filled 2015-03-30: qty 1

## 2015-03-30 MED ORDER — SODIUM CHLORIDE 0.9 % IV BOLUS (SEPSIS)
1000.0000 mL | Freq: Once | INTRAVENOUS | Status: AC
  Administered 2015-03-30: 1000 mL via INTRAVENOUS

## 2015-03-30 MED ORDER — MORPHINE SULFATE ER 60 MG PO TBCR
60.0000 mg | EXTENDED_RELEASE_TABLET | Freq: Three times a day (TID) | ORAL | Status: DC
  Administered 2015-03-30 – 2015-04-04 (×15): 60 mg via ORAL
  Filled 2015-03-30 (×3): qty 1
  Filled 2015-03-30: qty 2
  Filled 2015-03-30 (×11): qty 1

## 2015-03-30 MED ORDER — IPRATROPIUM BROMIDE 0.02 % IN SOLN
0.5000 mg | Freq: Three times a day (TID) | RESPIRATORY_TRACT | Status: DC
  Administered 2015-03-30 – 2015-04-04 (×10): 0.5 mg via RESPIRATORY_TRACT
  Filled 2015-03-30 (×13): qty 2.5

## 2015-03-30 MED ORDER — MORPHINE SULFATE (PF) 2 MG/ML IV SOLN
4.0000 mg | INTRAVENOUS | Status: DC | PRN
  Administered 2015-03-30 – 2015-04-04 (×17): 4 mg via INTRAVENOUS
  Filled 2015-03-30 (×19): qty 2

## 2015-03-30 MED ORDER — LORAZEPAM 1 MG PO TABS
1.0000 mg | ORAL_TABLET | Freq: Four times a day (QID) | ORAL | Status: DC | PRN
  Administered 2015-03-30 – 2015-04-03 (×3): 1 mg via ORAL
  Filled 2015-03-30 (×3): qty 1

## 2015-03-30 MED ORDER — PROPOFOL 10 MG/ML IV BOLUS
0.5000 mg/kg | Freq: Once | INTRAVENOUS | Status: AC
  Administered 2015-03-31: 50 mg via INTRAVENOUS

## 2015-03-30 MED ORDER — SENNOSIDES-DOCUSATE SODIUM 8.6-50 MG PO TABS
1.0000 | ORAL_TABLET | Freq: Every day | ORAL | Status: DC
  Administered 2015-03-30 – 2015-04-03 (×5): 1 via ORAL
  Filled 2015-03-30 (×6): qty 1

## 2015-03-30 MED ORDER — POLYETHYLENE GLYCOL 3350 17 G PO PACK
17.0000 g | PACK | Freq: Every day | ORAL | Status: DC | PRN
  Administered 2015-04-03: 17 g via ORAL
  Filled 2015-03-30: qty 1

## 2015-03-30 MED ORDER — DULOXETINE HCL 30 MG PO CPEP
30.0000 mg | ORAL_CAPSULE | Freq: Every day | ORAL | Status: DC
  Administered 2015-03-30 – 2015-04-04 (×5): 30 mg via ORAL
  Filled 2015-03-30 (×5): qty 1

## 2015-03-30 MED ORDER — HYDROMORPHONE HCL 2 MG PO TABS
2.0000 mg | ORAL_TABLET | ORAL | Status: DC | PRN
  Administered 2015-03-30 – 2015-04-03 (×12): 2 mg via ORAL
  Filled 2015-03-30 (×13): qty 1

## 2015-03-30 MED ORDER — ONDANSETRON HCL 4 MG PO TABS
8.0000 mg | ORAL_TABLET | Freq: Three times a day (TID) | ORAL | Status: DC | PRN
Start: 2015-03-30 — End: 2015-04-04

## 2015-03-30 MED ORDER — PANTOPRAZOLE SODIUM 40 MG PO TBEC
40.0000 mg | DELAYED_RELEASE_TABLET | Freq: Every day | ORAL | Status: DC
  Administered 2015-03-30 – 2015-04-04 (×5): 40 mg via ORAL
  Filled 2015-03-30 (×5): qty 1

## 2015-03-30 MED ORDER — ACETAMINOPHEN 325 MG PO TABS: 650.0000 mg | ORAL_TABLET | Freq: Four times a day (QID) | ORAL | Status: DC | PRN

## 2015-03-30 NOTE — ED Notes (Signed)
Report called  

## 2015-03-30 NOTE — ED Provider Notes (Signed)
CSN: [id number]     Arrival date & time 03/30/15  4174 History   First MD Initiated Contact with Patient 03/30/15 [phone]     Chief Complaint  Patient presents with  . 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  She had a hip dislocation 4 days ago and was reduced in the operating room.  She initially had her arthroplasty at the end of August 2016 after a fracture.  Patient has a history of metastatic cancer.  Patient reports she awoke from sleep with pain in her left hip and obvious shortening of her left lower extremity.  She has been wearing her knee immobilizer in bed.   Past Medical History  Diagnosis Date  . Cancer ([place])     left bronchioles  . Radiation 11/24/14-12/05/14    Left ribs 30 Gy in 10 fractions   Past Surgical History  Procedure Laterality Date  . Esophagogastroduodenoscopy N/A 09/14/2014    Procedure: ESOPHAGOGASTRODUODENOSCOPY (EGD);  Surgeon: Teena Irani, MD;  Location: [facility];  Service: Endoscopy;  Laterality: N/A;  . Total hip arthroplasty Left 02/10/2015    Procedure:   LEFT HIP BIPOLAR HEMI ARTHROPLASTY;  Surgeon: Mcarthur Rossetti, MD;  Location: [facility];  Service: Orthopedics;  Laterality: Left;  . Hip closed reduction Left 03/03/2015    Procedure: CLOSED MANIPULATION HIP;  Surgeon: Mcarthur Rossetti, MD;  Location: [place];  Service: Orthopedics;  Laterality: Left;  closed reduction left hip with intraprocedural incision repair and dressing  . Hip closed reduction Left 03/26/2015    Procedure: CLOSED REDUCTION HIP;  Surgeon: Marybelle Killings, MD;  Location: [place];  Service: Orthopedics;  Laterality: Left;   Family History  Problem Relation Age of Onset  . Cancer Mother   . Heart failure Father    Social History  Substance Use Topics  . Smoking status: Former Smoker    Quit date: 09/07/2014  . Smokeless  tobacco: None  . Alcohol Use: No   OB History    No data available     Review of Systems  All other systems reviewed and are negative.     Allergies  Penicillins  Home Medications   Prior to Admission medications   Medication Sig Start Date End Date Taking? Authorizing Provider  acetaminophen (TYLENOL) 325 MG tablet Take 2 tablets (650 mg total) by mouth every 6 (six) hours as needed for mild pain (or Fever >/= 101). 09/30/14  Yes Bonnielee Haff, MD  diclofenac (VOLTAREN) 50 MG EC tablet Take 1 tablet by mouth 2 (two) times daily. 03/25/15  Yes Historical Provider, MD  DULoxetine (CYMBALTA) 30 MG capsule Take 30 mg by mouth daily.  12/05/14  Yes Historical Provider, MD  enoxaparin (LOVENOX) 80 MG/0.8ML injection ADMINISTER 0.8 ML UNDER THE SKIN DAILY 01/26/15  Yes Maryanna Shape, NP  fentaNYL (DURAGESIC - DOSED MCG/HR) 100 MCG/HR Place 1 patch (100 mcg total) onto the skin every 3 (three) days. 03/05/15  Yes Mcarthur Rossetti, MD  ferrous sulfate 325 (65 FE) MG EC tablet Take 1 tablet (325 mg total) by mouth 2 (two) times daily with a meal. 01/26/15  Yes Maryanna Shape, NP  HYDROmorphone (DILAUDID) 2 MG tablet Take 2 mg by mouth every 4 (four) hours as needed for severe pain. 1-2 tabs PO Q 3-4 hours PRN   Yes Historical Provider,  MD  ipratropium (ATROVENT) 0.02 % nebulizer solution Take 2.5 mLs (0.5 mg total) by nebulization 3 (three) times daily. 11/18/14  Yes Maryanna Shape, NP  lidocaine (LIDODERM) 5 % 1 patch. To left ant chest wall, 12 hours on/12 hours off, Remove & Discard patch within 12 hours or as directed by MD   Yes Historical Provider, MD  LORazepam (ATIVAN) 1 MG tablet Take 1 tablet (1 mg total) by mouth every 6 (six) hours as needed for anxiety. 02/24/15  Yes Estill Dooms, MD  morphine (MS CONTIN) 30 MG 12 hr tablet Take 2 tablets by mouth every 8 (eight) hours. 0600, 1400, 2200 01/26/15  Yes Historical Provider, MD  omeprazole (PRILOSEC) 20 MG capsule Take 1 capsule (20  mg total) by mouth 2 (two) times daily before a meal. 09/30/14  Yes Bonnielee Haff, MD  ondansetron (ZOFRAN) 8 MG tablet TAKE 1 TABLET(8 MG) BY MOUTH EVERY 8 HOURS AS NEEDED FOR NAUSEA OR VOMITING 11/26/14  Yes Maryanna Shape, NP  polyethylene glycol (MIRALAX / GLYCOLAX) packet Take 17 g by mouth daily as needed for moderate constipation.    Yes Historical Provider, MD  senna-docusate (SENOKOT-S) 8.6-50 MG per tablet Take 1 tablet by mouth at bedtime. Patient taking differently: Take 1 tablet by mouth at bedtime as needed for mild constipation.  09/30/14  Yes Bonnielee Haff, MD  morphine (MS CONTIN) 60 MG 12 hr tablet Take 60 mg by mouth every 8 (eight) hours.    Historical Provider, MD   BP 113/73 mmHg  Pulse 79  Temp(Src) 98.3 F (36.8 C) (Oral)  Resp 18  SpO2 100% Physical Exam  Constitutional: She is oriented to person, place, and time. She appears well-developed and well-nourished. No distress.  HENT:  Head: Normocephalic and atraumatic.  Eyes: EOM are normal.  Neck: Normal range of motion.  Cardiovascular: Normal rate, regular rhythm and normal heart sounds.   Pulmonary/Chest: Effort normal and breath sounds normal.  Abdominal: Soft. She exhibits no distension. There is no tenderness.  Musculoskeletal:  Pain with range of motion of left hip.  Obvious shortening of left lower extremity as compared to right.  Wiggles toes on left.  Normal sensation left foot.  Normal pulses in left foot.  Neurological: She is alert and oriented to person, place, and time.  Skin: Skin is warm and dry.  Psychiatric: She has a normal mood and affect. Judgment normal.  Nursing note and vitals reviewed.   ED Course  Procedures (including critical care time) Labs Review Labs Reviewed  CBC - Abnormal; Notable for the following:    Hemoglobin 10.4 (*)    HCT 32.5 (*)    MCH 25.7 (*)    RDW 19.2 (*)    Platelets 608 (*)    All other components within normal limits  BASIC METABOLIC PANEL - Abnormal;  Notable for the following:    Sodium 132 (*)    Chloride 100 (*)    Calcium 8.5 (*)    All other components within normal limits    Imaging Review Dg Hip Unilat With Pelvis 2-3 Views Left  03/30/2015   CLINICAL DATA:  Suspected dislocation of the prosthetic left hip  EXAM: DG HIP (WITH OR WITHOUT PELVIS) 2-3V LEFT  COMPARISON:  Post reduction film of March 26, 2015  FINDINGS: The prosthetic left femoral head lies superior to the acetabulum consistent with acute re- dislocation. The native bone exhibits no acute abnormality.  IMPRESSION: The patient has sustained superior dislocation of  the prosthetic left hip.   Electronically Signed   By: David  Martinique M.D.   On: 03/30/2015 09:17   I have personally reviewed and evaluated these images and lab results as part of my medical decision-making.   EKG Interpretation None      MDM   Final diagnoses:  Hip dislocation, left, initial encounter [facility])    I spoke with her orthopedic surgeon Dr. Ninfa Linden who plans on some sort of revision tomorrow as this is becoming a recurrent issue.  Patient will be admitted to the hospital.  Temporary admission orders placed under Dr. Ninfa Linden.  Dr. Ninfa Linden will see the patient and the patient's family at lunch time.  He requests the patient be started on her home medications in the hospital.  He states the patient can eat today as he plans to operate tomorrow.  Nothing by mouth after midnight.  Patient family updated as to plan.    Jola Schmidt, MD 03/30/15 [phone]

## 2015-03-30 NOTE — Progress Notes (Signed)
[facility]  Patient Status: Active (receiving services up to time of hospitalization)  [facility] is providing the following services: RN, PT, OT, MSW and HHA  If patient discharges after hours, please call [phone].   Consepcion Hearing 03/30/2015, 11:22 AM

## 2015-03-30 NOTE — ED Notes (Signed)
Patient transported to X-ray 

## 2015-03-30 NOTE — H&P (Signed)
[name] is an 49 y.o. female.   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  A left hip hemiarthroplasty was performed thru an anterior approach and she did well for the first month.  I saw her in follow-up at that point and the left hip felt stable and her left leg was actually a little longer, which can help with stability.  One month post-op, she fell hard out of bed and sustained a posterior dislocation.  This was reduced in the OR.  Since then, she has experience 2 more posterior dislocations.  She is now being admitted for revision surgery and to assess her pelvis for a possible acetabula fracture and to assess her pelvis further.  Past Medical History  Diagnosis Date  . Cancer ([place])     left bronchioles  . Radiation 11/24/14-12/05/14    Left ribs 30 Gy in 10 fractions    Past Surgical History  Procedure Laterality Date  . Esophagogastroduodenoscopy N/A 09/14/2014    Procedure: ESOPHAGOGASTRODUODENOSCOPY (EGD);  Surgeon: Teena Irani, MD;  Location: [facility];  Service: Endoscopy;  Laterality: N/A;  . Total hip arthroplasty Left 02/10/2015    Procedure:   LEFT HIP BIPOLAR HEMI ARTHROPLASTY;  Surgeon: Mcarthur Rossetti, MD;  Location: [facility];  Service: Orthopedics;  Laterality: Left;  . Hip closed reduction Left 03/03/2015    Procedure: CLOSED MANIPULATION HIP;  Surgeon: Mcarthur Rossetti, MD;  Location: [facility];  Service: Orthopedics;  Laterality: Left;  closed reduction left hip with intraprocedural incision repair and dressing  . Hip closed reduction Left 03/26/2015    Procedure: CLOSED REDUCTION HIP;  Surgeon: Marybelle Killings, MD;  Location: [facility];  Service: Orthopedics;  Laterality: Left;    Family History  Problem Relation Age of Onset  . Cancer Mother   . Heart failure Father    Social History:   reports that she quit smoking about 6 months ago. She does not have any smokeless tobacco history on file. She reports that she does not drink alcohol or use illicit drugs.  Allergies:  Allergies  Allergen Reactions  . Penicillins Rash    Has patient had a PCN reaction causing immediate rash, facial/tongue/throat swelling, SOB or lightheadedness with hypotension: no Has patient had a PCN reaction causing severe rash involving mucus membranes or skin necrosis: unknown Has patient had a PCN reaction that required hospitalization unknown Has patient had a PCN reaction occurring within the last 10 years: no If all of the above answers are "NO", then may proceed with Cephalosporin use.     Medications Prior to Admission  Medication Sig Dispense Refill  . acetaminophen (TYLENOL) 325 MG tablet Take 2 tablets (650 mg total) by mouth every 6 (six) hours as needed for mild pain (or Fever >/= 101).    Marland Kitchen diclofenac (VOLTAREN) 50 MG EC tablet Take 1 tablet by mouth 2 (two) times daily.    . DULoxetine (CYMBALTA) 30 MG capsule Take 30 mg by mouth daily.     Marland Kitchen enoxaparin (LOVENOX) 80 MG/0.8ML injection ADMINISTER 0.8 ML UNDER THE SKIN DAILY 24 mL 0  . fentaNYL (DURAGESIC - DOSED MCG/HR) 100 MCG/HR Place 1 patch (100 mcg total) onto the skin every 3 (three) days. 5 patch 0  . ferrous sulfate 325 (65 FE) MG EC tablet Take  1 tablet (325 mg total) by mouth 2 (two) times daily with a meal. 60 tablet 1  . HYDROmorphone (DILAUDID) 2 MG tablet Take 2 mg by mouth every 4 (four) hours as needed for severe pain. 1-2 tabs PO Q 3-4 hours PRN    . ipratropium (ATROVENT) 0.02 % nebulizer solution Take 2.5 mLs (0.5 mg total) by nebulization 3 (three) times daily. 75 mL 0  . lidocaine (LIDODERM) 5 % 1 patch. To left ant chest wall, 12 hours on/12 hours off, Remove & Discard patch within 12 hours or as directed by MD    . LORazepam (ATIVAN) 1 MG tablet Take 1 tablet (1 mg total) by mouth every 6 (six) hours as needed for  anxiety. 120 tablet 5  . morphine (MS CONTIN) 30 MG 12 hr tablet Take 2 tablets by mouth every 8 (eight) hours. 0600, 1400, 2200  0  . omeprazole (PRILOSEC) 20 MG capsule Take 1 capsule (20 mg total) by mouth 2 (two) times daily before a meal. 60 capsule 1  . ondansetron (ZOFRAN) 8 MG tablet TAKE 1 TABLET(8 MG) BY MOUTH EVERY 8 HOURS AS NEEDED FOR NAUSEA OR VOMITING 20 tablet 0  . polyethylene glycol (MIRALAX / GLYCOLAX) packet Take 17 g by mouth daily as needed for moderate constipation.     . senna-docusate (SENOKOT-S) 8.6-50 MG per tablet Take 1 tablet by mouth at bedtime. (Patient taking differently: Take 1 tablet by mouth at bedtime as needed for mild constipation. ) 30 tablet 2  . morphine (MS CONTIN) 60 MG 12 hr tablet Take 60 mg by mouth every 8 (eight) hours.      Results for orders placed or performed during the hospital encounter of 03/30/15 (from the past 48 hour(s))  CBC     Status: Abnormal   Collection Time: 03/30/15  8:40 AM  Result Value Ref Range   WBC 9.4 4.0 - 10.5 K/uL   RBC 4.05 3.87 - 5.11 MIL/uL   Hemoglobin 10.4 (L) 12.0 - 15.0 g/dL   HCT 32.5 (L) 36.0 - 46.0 %   MCV 80.2 78.0 - 100.0 fL   MCH 25.7 (L) 26.0 - 34.0 pg   MCHC 32.0 30.0 - 36.0 g/dL   RDW 19.2 (H) 11.5 - 15.5 %   Platelets 608 (H) 150 - 400 K/uL  Basic metabolic panel     Status: Abnormal   Collection Time: 03/30/15  8:40 AM  Result Value Ref Range   Sodium 132 (L) 135 - 145 mmol/L   Potassium 3.9 3.5 - 5.1 mmol/L   Chloride 100 (L) 101 - 111 mmol/L   CO2 24 22 - 32 mmol/L   Glucose, Bld 79 65 - 99 mg/dL   BUN 9 6 - 20 mg/dL   Creatinine, Ser 0.51 0.44 - 1.00 mg/dL   Calcium 8.5 (L) 8.9 - 10.3 mg/dL   GFR calc non Af Amer >60 >60 mL/min   GFR calc Af Amer >60 >60 mL/min    Comment: (NOTE) The eGFR has been calculated using the CKD EPI equation. This calculation has not been validated in all clinical situations. eGFR's persistently <60 mL/min signify possible Chronic Kidney Disease.     Anion gap 8 5 - 15   Dg Hip Unilat With Pelvis 2-3 Views Left  03/30/2015   CLINICAL DATA:  Suspected dislocation of the prosthetic left hip  EXAM: DG HIP (WITH OR WITHOUT PELVIS) 2-3V LEFT  COMPARISON:  Post reduction film of March 26, 2015  FINDINGS: The  prosthetic left femoral head lies superior to the acetabulum consistent with acute re- dislocation. The native bone exhibits no acute abnormality.  IMPRESSION: The patient has sustained superior dislocation of the prosthetic left hip.   Electronically Signed   By: David  Martinique M.D.   On: 03/30/2015 09:17    Review of Systems  Constitutional: Positive for malaise/fatigue.    Blood pressure 112/75, pulse 77, temperature 98.1 F (36.7 C), temperature source Oral, resp. rate 16, SpO2 100 %. Physical Exam  Constitutional: She is oriented to person, place, and time. She appears cachectic.  HENT:  Head: Normocephalic and atraumatic.  Eyes: Pupils are equal, round, and reactive to light.  Neck: Normal range of motion.  Cardiovascular: Normal rate and regular rhythm.   Respiratory: Effort normal and breath sounds normal.  GI: Soft. Bowel sounds are normal.  Musculoskeletal:       Left hip: She exhibits tenderness and deformity.  Neurological: She is alert and oriented to person, place, and time.  Skin: Skin is warm and dry.  Psychiatric: She has a normal mood and affect.     Assessment/Plan Recurrent dislocation of left hip hemiarthroplasty 1)  I have talked with her and her brother in length.  She understands fully her predicament.  I believe she is so cachectic that her soft tissues are not supporting the hip hemiarthroplasty.  I did leave her long at the time of her original surgery and performed this through an anterior approach.  Her first dislocation occurred a month out from surgery and happened when she fell hard out of bed.  The dislocations have been posterior and certainly there can be an issue with her acetabulum.  I will plan  on revision surgery tomorrow to a total hip and will obtain a CT scan of her left hip to evaluate her acetabulum in the interim.  Abree Romick Y 03/30/2015, 11:55 AM

## 2015-03-30 NOTE — Care Management (Signed)
Utilization review completed. Silvina Hackleman, RN Case Manager [phone]. 

## 2015-03-30 NOTE — ED Notes (Signed)
Pt here from home with left hip dislocation , pt has history of same states that it happened in her sleep ,

## 2015-03-31 ENCOUNTER — Inpatient Hospital Stay (HOSPITAL_COMMUNITY): Payer: 59

## 2015-03-31 ENCOUNTER — Encounter (HOSPITAL_COMMUNITY)
Admission: EM | Disposition: A | Discharge status: Home Health | Payer: Self-pay | Source: Home / Self Care | Attending: Orthopaedic Surgery

## 2015-03-31 ENCOUNTER — Inpatient Hospital Stay (HOSPITAL_COMMUNITY): Payer: 59 | Admitting: Anesthesiology

## 2015-03-31 HISTORY — PX: TOTAL HIP ARTHROPLASTY: SHX124

## 2015-03-31 LAB — BLOOD GAS, ARTERIAL
ACID-BASE DEFICIT: 3.2 mmol/L — AB (ref 0.0–2.0)
Bicarbonate: 20.8 mEq/L (ref 20.0–24.0)
Drawn by: 313061
O2 Content: 10 L/min
O2 SAT: 99.4 %
PATIENT TEMPERATURE: 98.6
TCO2: 21.8 mmol/L (ref 0–100)
pCO2 arterial: 34.2 mmHg — ABNORMAL LOW (ref 35.0–45.0)
pH, Arterial: 7.401 (ref 7.350–7.450)
pO2, Arterial: 214 mmHg — ABNORMAL HIGH (ref 80.0–100.0)

## 2015-03-31 LAB — SURGICAL PCR SCREEN
MRSA, PCR: NEGATIVE
STAPHYLOCOCCUS AUREUS: POSITIVE — AB

## 2015-03-31 SURGERY — ARTHROPLASTY, HIP, TOTAL, ANTERIOR APPROACH
Anesthesia: General | Site: Hip | Laterality: Left

## 2015-03-31 MED ORDER — SODIUM CHLORIDE 0.9 % IV SOLN
INTRAVENOUS | Status: DC
  Administered 2015-04-01: 04:00:00 via INTRAVENOUS

## 2015-03-31 MED ORDER — MIDAZOLAM HCL 2 MG/2ML IJ SOLN
INTRAMUSCULAR | Status: AC
  Filled 2015-03-31: qty 4

## 2015-03-31 MED ORDER — ALBUTEROL SULFATE (2.5 MG/3ML) 0.083% IN NEBU
INHALATION_SOLUTION | RESPIRATORY_TRACT | Status: AC
  Administered 2015-03-31: 2.5 mg
  Filled 2015-03-31: qty 3

## 2015-03-31 MED ORDER — SODIUM CHLORIDE 0.9 % IR SOLN
Status: DC | PRN
  Administered 2015-03-31: 3000 mL

## 2015-03-31 MED ORDER — PROMETHAZINE HCL 25 MG/ML IJ SOLN: 6.2500 mg | INTRAMUSCULAR | Status: DC | PRN

## 2015-03-31 MED ORDER — MIDAZOLAM HCL 5 MG/5ML IJ SOLN
INTRAMUSCULAR | Status: DC | PRN
  Administered 2015-03-31 (×2): 1 mg via INTRAVENOUS

## 2015-03-31 MED ORDER — ACETAMINOPHEN 10 MG/ML IV SOLN
INTRAVENOUS | Status: AC
  Filled 2015-03-31: qty 100

## 2015-03-31 MED ORDER — MUPIROCIN 2 % EX OINT
TOPICAL_OINTMENT | Freq: Two times a day (BID) | CUTANEOUS | Status: DC
  Administered 2015-03-31 – 2015-04-04 (×9): via NASAL
  Filled 2015-03-31: qty 22

## 2015-03-31 MED ORDER — SODIUM CHLORIDE 0.9 % IV SOLN
10.0000 mg | INTRAVENOUS | Status: DC | PRN
  Administered 2015-03-31: 15 ug/min via INTRAVENOUS

## 2015-03-31 MED ORDER — NALOXONE HCL 0.4 MG/ML IJ SOLN: 0.4000 mg | INTRAMUSCULAR | Status: DC | PRN

## 2015-03-31 MED ORDER — FENTANYL CITRATE (PF) 100 MCG/2ML IJ SOLN
INTRAMUSCULAR | Status: DC | PRN
  Administered 2015-03-31 (×5): 50 ug via INTRAVENOUS

## 2015-03-31 MED ORDER — ROCURONIUM BROMIDE 100 MG/10ML IV SOLN
INTRAVENOUS | Status: DC | PRN
  Administered 2015-03-31: 50 mg via INTRAVENOUS

## 2015-03-31 MED ORDER — PROPOFOL 10 MG/ML IV BOLUS
INTRAVENOUS | Status: AC
  Filled 2015-03-31: qty 20

## 2015-03-31 MED ORDER — HYDROMORPHONE HCL 1 MG/ML IJ SOLN
INTRAMUSCULAR | Status: AC
  Filled 2015-03-31: qty 1

## 2015-03-31 MED ORDER — ACETAMINOPHEN 10 MG/ML IV SOLN
1000.0000 mg | Freq: Once | INTRAVENOUS | Status: AC
  Administered 2015-03-31: 1000 mg via INTRAVENOUS

## 2015-03-31 MED ORDER — NEOSTIGMINE METHYLSULFATE 10 MG/10ML IV SOLN
INTRAVENOUS | Status: DC | PRN
  Administered 2015-03-31: 5 mg via INTRAVENOUS

## 2015-03-31 MED ORDER — PHENYLEPHRINE HCL 10 MG/ML IJ SOLN
INTRAMUSCULAR | Status: DC | PRN
  Administered 2015-03-31: 80 ug via INTRAVENOUS
  Administered 2015-03-31: 40 ug via INTRAVENOUS

## 2015-03-31 MED ORDER — FENTANYL CITRATE (PF) 250 MCG/5ML IJ SOLN
INTRAMUSCULAR | Status: AC
  Filled 2015-03-31: qty 5

## 2015-03-31 MED ORDER — NALOXONE HCL 0.4 MG/ML IJ SOLN
INTRAMUSCULAR | Status: AC
  Administered 2015-03-31: 0.4 mg
  Filled 2015-03-31: qty 1

## 2015-03-31 MED ORDER — LIDOCAINE HCL (CARDIAC) 20 MG/ML IV SOLN
INTRAVENOUS | Status: DC | PRN
  Administered 2015-03-31: 40 mg via INTRAVENOUS

## 2015-03-31 MED ORDER — ONDANSETRON HCL 4 MG/2ML IJ SOLN
INTRAMUSCULAR | Status: DC | PRN
  Administered 2015-03-31: 4 mg via INTRAVENOUS

## 2015-03-31 MED ORDER — SODIUM CHLORIDE 0.9 % IV SOLN: INTRAVENOUS | Status: DC

## 2015-03-31 MED ORDER — CLINDAMYCIN PHOSPHATE 900 MG/50ML IV SOLN
900.0000 mg | INTRAVENOUS | Status: AC
  Administered 2015-03-31: 900 mg via INTRAVENOUS
  Filled 2015-03-31: qty 50

## 2015-03-31 MED ORDER — LACTATED RINGERS IV SOLN
INTRAVENOUS | Status: DC
  Administered 2015-03-31 (×2): via INTRAVENOUS

## 2015-03-31 MED ORDER — 0.9 % SODIUM CHLORIDE (POUR BTL) OPTIME
TOPICAL | Status: DC | PRN
  Administered 2015-03-31: 1000 mL

## 2015-03-31 MED ORDER — GLYCOPYRROLATE 0.2 MG/ML IJ SOLN
INTRAMUSCULAR | Status: DC | PRN
  Administered 2015-03-31: 0.6 mg via INTRAVENOUS

## 2015-03-31 MED ORDER — HYDROMORPHONE HCL 1 MG/ML IJ SOLN
0.2500 mg | INTRAMUSCULAR | Status: DC | PRN
  Administered 2015-03-31 (×2): 0.5 mg via INTRAVENOUS
  Administered 2015-03-31 (×2): 0.25 mg via INTRAVENOUS

## 2015-03-31 MED ORDER — MEPERIDINE HCL 25 MG/ML IJ SOLN: 6.2500 mg | INTRAMUSCULAR | Status: DC | PRN

## 2015-03-31 MED ORDER — ALBUMIN HUMAN 5 % IV SOLN
INTRAVENOUS | Status: DC | PRN
  Administered 2015-03-31: 19:00:00 via INTRAVENOUS

## 2015-03-31 SURGICAL SUPPLY — 53 items
BENZOIN TINCTURE PRP APPL 2/3 (GAUZE/BANDAGES/DRESSINGS) ×3 IMPLANT
BLADE SAW SGTL 18X1.27X75 (BLADE) ×2 IMPLANT
BLADE SAW SGTL 18X1.27X75MM (BLADE) ×1
BLADE SURG ROTATE 9660 (MISCELLANEOUS) IMPLANT
CELLS DAT CNTRL 66122 CELL SVR (MISCELLANEOUS) ×1 IMPLANT
CLOSURE WOUND 1/2 X4 (GAUZE/BANDAGES/DRESSINGS) ×2
COVER SURGICAL LIGHT HANDLE (MISCELLANEOUS) ×3 IMPLANT
DRAPE C-ARM 42X72 X-RAY (DRAPES) ×3 IMPLANT
DRAPE STERI IOBAN 125X83 (DRAPES) ×3 IMPLANT
DRAPE U-SHAPE 47X51 STRL (DRAPES) ×9 IMPLANT
DRSG AQUACEL AG ADV 3.5X10 (GAUZE/BANDAGES/DRESSINGS) ×3 IMPLANT
DURAPREP 26ML APPLICATOR (WOUND CARE) ×3 IMPLANT
ELECT BLADE 4.0 EZ CLEAN MEGAD (MISCELLANEOUS) ×3
ELECT BLADE 6.5 EXT (BLADE) IMPLANT
ELECT REM PT RETURN 9FT ADLT (ELECTROSURGICAL) ×3
ELECTRODE BLDE 4.0 EZ CLN MEGD (MISCELLANEOUS) ×1 IMPLANT
ELECTRODE REM PT RTRN 9FT ADLT (ELECTROSURGICAL) ×1 IMPLANT
FACESHIELD WRAPAROUND (MASK) ×6 IMPLANT
GAUZE XEROFORM 5X9 LF (GAUZE/BANDAGES/DRESSINGS) ×3 IMPLANT
GLOVE BIOGEL PI IND STRL 8 (GLOVE) ×2 IMPLANT
GLOVE BIOGEL PI INDICATOR 8 (GLOVE) ×4
GLOVE ECLIPSE 8.0 STRL XLNG CF (GLOVE) ×3 IMPLANT
GLOVE ORTHO TXT STRL SZ7.5 (GLOVE) ×6 IMPLANT
GOWN STRL REUS W/ TWL LRG LVL3 (GOWN DISPOSABLE) ×2 IMPLANT
GOWN STRL REUS W/ TWL XL LVL3 (GOWN DISPOSABLE) ×2 IMPLANT
GOWN STRL REUS W/TWL LRG LVL3 (GOWN DISPOSABLE) ×4
GOWN STRL REUS W/TWL XL LVL3 (GOWN DISPOSABLE) ×4
HANDPIECE INTERPULSE COAX TIP (DISPOSABLE) ×2
HEAD FEM STD 32X+5 STRL (Hips) ×3 IMPLANT
HIP CUP/LINER CAPT DEPUY (Capitated) ×3 IMPLANT
KIT BASIN OR (CUSTOM PROCEDURE TRAY) ×3 IMPLANT
KIT ROOM TURNOVER OR (KITS) ×3 IMPLANT
MANIFOLD NEPTUNE II (INSTRUMENTS) ×3 IMPLANT
NS IRRIG 1000ML POUR BTL (IV SOLUTION) ×3 IMPLANT
PACK TOTAL JOINT (CUSTOM PROCEDURE TRAY) ×3 IMPLANT
PACK UNIVERSAL I (CUSTOM PROCEDURE TRAY) ×3 IMPLANT
PAD ARMBOARD 7.5X6 YLW CONV (MISCELLANEOUS) ×3 IMPLANT
RTRCTR WOUND ALEXIS 18CM MED (MISCELLANEOUS) ×3
SET HNDPC FAN SPRY TIP SCT (DISPOSABLE) ×1 IMPLANT
STAPLER VISISTAT 35W (STAPLE) IMPLANT
STRIP CLOSURE SKIN 1/2X4 (GAUZE/BANDAGES/DRESSINGS) ×4 IMPLANT
SUT ETHIBOND NAB CT1 #1 30IN (SUTURE) ×3 IMPLANT
SUT MNCRL AB 4-0 PS2 18 (SUTURE) IMPLANT
SUT VIC AB 0 CT1 27 (SUTURE) ×2
SUT VIC AB 0 CT1 27XBRD ANBCTR (SUTURE) ×1 IMPLANT
SUT VIC AB 1 CT1 27 (SUTURE) ×2
SUT VIC AB 1 CT1 27XBRD ANBCTR (SUTURE) ×1 IMPLANT
SUT VIC AB 2-0 CT1 27 (SUTURE) ×2
SUT VIC AB 2-0 CT1 TAPERPNT 27 (SUTURE) ×1 IMPLANT
TOWEL OR 17X24 6PK STRL BLUE (TOWEL DISPOSABLE) ×3 IMPLANT
TOWEL OR 17X26 10 PK STRL BLUE (TOWEL DISPOSABLE) ×3 IMPLANT
TRAY FOLEY CATH 16FRSI W/METER (SET/KITS/TRAYS/PACK) IMPLANT
WATER STERILE IRR 1000ML POUR (IV SOLUTION) IMPLANT

## 2015-03-31 NOTE — Brief Op Note (Signed)
03/30/2015 - 03/31/2015  7:36 PM  PATIENT:  Edmonia Lynch  49 y.o. female  PRE-OPERATIVE DIAGNOSIS:  Recurrent dislocation left hip hemiarthroplasty  POST-OPERATIVE DIAGNOSIS:  Recurrent dislocation left hip hemiarthroplasty  PROCEDURE:  Procedure(s): LEFT HIP REVISION OF HEMIARTHROPLASTY TO TOTAL HIP ARTHROPLASTY ANTERIOR APPROACH (Left)  SURGEON:  Surgeon(s) and Role:    * Mcarthur Rossetti, MD - Primary  PHYSICIAN ASSISTANT: Benita Stabile, PA-C  ANESTHESIA:   general  EBL:  Total I/O In: 250 [IV Piggyback:250] Out: 350 [Urine:200; Blood:150]  BLOOD ADMINISTERED:none  DICTATION: .Other Dictation: Dictation Number [id number]  PLAN OF CARE: Admit to inpatient   PATIENT DISPOSITION:  PACU - hemodynamically stable.   Delay start of Pharmacological VTE agent (>24hrs) due to surgical blood loss or risk of bleeding: no

## 2015-03-31 NOTE — Anesthesia Postprocedure Evaluation (Signed)
  Anesthesia Post-op Note  Patient: [name]  Procedure(s) Performed: Procedure(s) (LRB): LEFT HIP REVISION OF HEMIARTHROPLASTY TO TOTAL HIP ARTHROPLASTY ANTERIOR APPROACH (Left)  Patient Location: PACU  Anesthesia Type: General  Level of Consciousness: awake and alert   Airway and Oxygen Therapy: Patient Spontanous Breathing, nasal cannula in place  Post-op Pain: mild  Post-op Assessment: Post-op Vital signs reviewed, Patient's Cardiovascular Status Stable, Respiratory Function Stable, Patent Airway and No signs of Nausea or vomiting  Last Vitals:  Filed Vitals:   03/31/15 2100  BP: 122/72  Pulse: 81  Temp:   Resp: 19    Post-op Vital Signs: stable   Complications: Patient with brief period where she expressed difficulty breathing, vital signs were stable, IV hydromorphone had just been given so we gave 65mg naloxone IV x1 with improvement in her respiratory effort. CXR performed and was baseline, ABG sent with PaO2 >200 on nasal cannula, PaCO2 34 with pH of 7.4. Patient actively warmed as she expressed being cold. Albuterol nebulizer treatment given, Incentive spirometer enforced and patient repositioned. I personally watched her for over 20 minutes with adequate respirations, no complaints and baseline vital signs. She should be restarted on her scheduled oral pain medication for lasting pain control, have her brother (primary care giver) aid with nursing in deep respirations with a flutter valve and have her position optimized at >45 degrees upright to help treat post op atelectasis. She is stable for discharge currently.

## 2015-03-31 NOTE — Anesthesia Procedure Notes (Addendum)
Procedure Name: Intubation Date/Time: 03/31/2015 5:58 PM Performed by: Clearnce Sorrel Pre-anesthesia Checklist: Patient identified, Timeout performed, Emergency Drugs available, Suction available and Patient being monitored Patient Re-evaluated:Patient Re-evaluated prior to inductionOxygen Delivery Method: Circle system utilized Preoxygenation: Pre-oxygenation with 100% oxygen Intubation Type: IV induction and Cricoid Pressure applied Ventilation: Mask ventilation without difficulty Laryngoscope Size: Mac and 3 Grade View: Grade I Tube type: Oral Tube size: 7.5 mm Number of attempts: 1 Airway Equipment and Method: Stylet Placement Confirmation: ETT inserted through vocal cords under direct vision,  breath sounds checked- equal and bilateral and positive ETCO2 Secured at: 21 cm Tube secured with: Tape Dental Injury: Teeth and Oropharynx as per pre-operative assessment  Comments: Induction , Intubation per Darlen Round , CRNA

## 2015-03-31 NOTE — Progress Notes (Signed)
Patient ID: [name], female   DOB: 1966/02/06, 49 y.o.   MRN: [id number] [name] understands full that we will proceed to the OR late today for revision of her left hip hemiarthroplasty to a total hip due to recurrent instability.

## 2015-03-31 NOTE — Anesthesia Preprocedure Evaluation (Addendum)
Anesthesia Evaluation  Patient identified by MRN, date of birth, ID band Patient awake    Reviewed: Allergy & Precautions, NPO status , Patient's Chart, lab work & pertinent test results  History of Anesthesia Complications Negative for: history of anesthetic complications  Airway Mallampati: II  TM Distance: >3 FB Neck ROM: Full    Dental  (+) Dental Advisory Given, Poor Dentition   Pulmonary former smoker,  Lung cancer with extension to chest wall: XRT   breath sounds clear to auscultation       Cardiovascular negative cardio ROS   Rhythm:Regular Rate:Normal  3/16 ECHO: EF 60%, valves OK   Neuro/Psych negative neurological ROS  negative psych ROS   GI/Hepatic negative GI ROS, Elevated LFTs   Endo/Other  Cachexia  Renal/GU negative Renal ROS     Musculoskeletal Dislocated THR   Abdominal   Peds  Hematology  (+) Blood dyscrasia, anemia ,   Anesthesia Other Findings   Reproductive/Obstetrics                           Anesthesia Physical Anesthesia Plan  ASA: III  Anesthesia Plan: General   Post-op Pain Management:    Induction: Intravenous  Airway Management Planned: Oral ETT  Additional Equipment:   Intra-op Plan:   Post-operative Plan: Extubation in OR and Possible Post-op intubation/ventilation  Informed Consent: I have reviewed the patients History and Physical, chart, labs and discussed the procedure including the risks, benefits and alternatives for the proposed anesthesia with the patient or authorized representative who has indicated his/her understanding and acceptance.   Dental advisory given  Plan Discussed with: CRNA  Anesthesia Plan Comments: (Risks/benefits of general anesthesia discussed with patient including risk of damage to teeth, lips, gum, and tongue, nausea/vomiting, allergic reactions to medications, and the possibility of heart attack, stroke and  death.  All patient questions answered.  Patient wishes to proceed.)        Anesthesia Quick Evaluation

## 2015-03-31 NOTE — Progress Notes (Signed)
Given albuterol nebulizer treatment, and pt cooperating with incentive spirometer.  RT has been to bedside to draw ABG.  MD still at bedside wants to continue current treatment and practice good pulmonary toilet.  Pt CXRAY same as baseline.  Will continue to monitor and notify for further changes.

## 2015-03-31 NOTE — Transfer of Care (Signed)
Immediate Anesthesia Transfer of Care Note  Patient: [name]  Procedure(s) Performed: Procedure(s): LEFT HIP REVISION OF HEMIARTHROPLASTY TO TOTAL HIP ARTHROPLASTY ANTERIOR APPROACH (Left)  Patient Location: PACU  Anesthesia Type:General  Level of Consciousness: sedated, patient cooperative and responds to stimulation  Airway & Oxygen Therapy: Patient Spontanous Breathing and Patient connected to nasal cannula oxygen  Post-op Assessment: Report given to RN, Post -op Vital signs reviewed and stable, Patient moving all extremities and Patient moving all extremities X 4  Post vital signs: Reviewed and stable  Last Vitals:  Filed Vitals:   03/31/15 1633  BP: 119/75  Pulse: 85  Temp: 36.8 C  Resp: 18    Complications: No apparent anesthesia complications

## 2015-03-31 NOTE — Progress Notes (Signed)
Pt quickly desat to 60-70's.  Pt placed on simple mask to 15L.  Breath sounds assessed pt moving little to no air.  Bag valve mask ready and inflated.  Pt complaining that she can't breath.  MD to bedside.  Narcan given IV. MD to order chest XRay and ABG.  Will continue to monitor.

## 2015-04-01 ENCOUNTER — Encounter (HOSPITAL_COMMUNITY): Payer: Self-pay | Admitting: Orthopaedic Surgery

## 2015-04-01 LAB — CBC
HCT: 24.5 % — ABNORMAL LOW (ref 36.0–46.0)
Hemoglobin: 8 g/dL — ABNORMAL LOW (ref 12.0–15.0)
MCH: 26.3 pg (ref 26.0–34.0)
MCHC: 32.7 g/dL (ref 30.0–36.0)
MCV: 80.6 fL (ref 78.0–100.0)
PLATELETS: 618 10*3/uL — AB (ref 150–400)
RBC: 3.04 MIL/uL — AB (ref 3.87–5.11)
RDW: 19.8 % — ABNORMAL HIGH (ref 11.5–15.5)
WBC: 12.4 10*3/uL — AB (ref 4.0–10.5)

## 2015-04-01 LAB — PREPARE RBC (CROSSMATCH)

## 2015-04-01 LAB — ABO/RH: ABO/RH(D): O POS

## 2015-04-01 MED ORDER — ENOXAPARIN SODIUM 80 MG/0.8ML ~~LOC~~ SOLN
1.5000 mg/kg | SUBCUTANEOUS | Status: DC
  Administered 2015-04-01 – 2015-04-04 (×4): 70 mg via SUBCUTANEOUS
  Filled 2015-04-01 (×5): qty 0.8

## 2015-04-01 MED ORDER — SODIUM CHLORIDE 0.9 % IV SOLN: Freq: Once | INTRAVENOUS | Status: DC

## 2015-04-01 NOTE — Clinical Documentation Improvement (Signed)
Orthopedic  Abnormal Lab/Test Results:  03/30/15: Na= 132  Possible Clinical Conditions associated with below indicators  Hyponatremia  Other Condition  Cannot Clinically Determine   Treatment Provided: IVF's   Please exercise your independent, professional judgment when responding. A specific answer is not anticipated or expected.   Thank You,  Rolm Gala, RN, [place] [phone]

## 2015-04-01 NOTE — Evaluation (Signed)
Physical Therapy Evaluation Patient Details Name: [name] MRN: [id number] DOB: 1966-04-18 Today's Date: 04/01/2015   History of Present Illness  49 y.o. female s/p LEFT HIP REVISION OF HEMIARTHROPLASTY TO TOTAL HIP ARTHROPLASTY ANTERIOR APPROACH. 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  Clinical Impression  Patient is s/p above surgery presenting with functional limitations due to the deficits listed below (see PT Problem List). Good strength with bed mobility and transfer. Unable to tolerated ambulating at this time due to pain. Min assist to min guard with mobility. Eager to return home and be able to walk without assistance. States she was making very good progress from rehab and was planning to begin HHPT prior to this admission. Patient will benefit from skilled PT to increase their independence and safety with mobility to allow discharge to the venue listed below.       Follow Up Recommendations Home health PT;Supervision for mobility/OOB    Equipment Recommendations  None recommended by PT    Recommendations for Other Services OT consult     Precautions / Restrictions Precautions Precautions: Other (comment) (None listed) Precaution Comments: Previously posterior-hip precautions. None listed this admission. Restrictions Weight Bearing Restrictions: Yes LLE Weight Bearing: Weight bearing as tolerated      Mobility  Bed Mobility Overal bed mobility: Needs Assistance Bed Mobility: Supine to Sit;Sit to Supine     Supine to sit: Min assist Sit to supine: Min guard   General bed mobility comments: Min assist for LLE support out of the bed, and to pull through PTs hand. No physical assist needed to return to supine. Cues for technique and safety.  Transfers Overall transfer level: Needs assistance Equipment used: Rolling walker (2 wheeled) Transfers: Sit to/from Stand Sit to Stand: Min assist         General transfer comment: Light  assist for boost to stand from bed. VC for hand placement. Fair weight bearing through LLE although very guarded. Unable to tolerated steps at this time due to increased pain.  Ambulation/Gait                Stairs            Wheelchair Mobility    Modified Rankin (Stroke Patients Only)       Balance Overall balance assessment: Needs assistance Sitting-balance support: No upper extremity supported;Feet supported Sitting balance-Leahy Scale: Fair     Standing balance support: Bilateral upper extremity supported Standing balance-Leahy Scale: Poor                               Pertinent Vitals/Pain Pain Assessment: 0-10 Pain Score: 7  Pain Location: left hip and thigh Pain Descriptors / Indicators: Aching Pain Intervention(s): Monitored during session;Repositioned;Patient requesting pain meds-RN notified    Home Living Family/patient expects to be discharged to:: Private residence Living Arrangements: Other relatives (brother) Available Help at Discharge: Family;Personal care attendant;Available 24 hours/day Type of Home: House Home Access: Stairs to enter Entrance Stairs-Rails: Right Entrance Stairs-Number of Steps: 3 Home Layout: Two level;Bed/bath upstairs Home Equipment: Bedside commode;Grab bars - toilet;Hand held shower head;Walker - 2 wheels;Tub bench Additional Comments: Left SNF and has returned home. Was waiting for HHPT to begin.    Prior Function Level of Independence: Needs assistance   Gait / Transfers Assistance Needed: Ambulating with RW and some assist. Able to transfer on own  ADL's / Homemaking Assistance Needed: Needs assist for bath/dress  Comments: States her brother pulls wheelchair up 3 steps without difficulty, in order to enter house.     Hand Dominance   Dominant Hand: Right    Extremity/Trunk Assessment   Upper Extremity Assessment: Defer to OT evaluation           Lower Extremity Assessment: LLE  deficits/detail   LLE Deficits / Details: decreased strength and ROM as expected post op     Communication   Communication: No difficulties  Cognition Arousal/Alertness: Awake/alert Behavior During Therapy: WFL for tasks assessed/performed Overall Cognitive Status: Within Functional Limits for tasks assessed                      General Comments      Exercises        Assessment/Plan    PT Assessment Patient needs continued PT services  PT Diagnosis Difficulty walking;Generalized weakness;Acute pain   PT Problem List Decreased strength;Decreased range of motion;Decreased activity tolerance;Decreased balance;Decreased mobility;Decreased knowledge of use of DME;Pain  PT Treatment Interventions DME instruction;Gait training;Functional mobility training;Therapeutic activities;Therapeutic exercise;Balance training;Neuromuscular re-education;Patient/family education;Modalities   PT Goals (Current goals can be found in the Care Plan section) Acute Rehab PT Goals Patient Stated Goal: Walk by myself PT Goal Formulation: With patient Time For Goal Achievement: 04/08/15 Potential to Achieve Goals: Good    Frequency 7X/week   Barriers to discharge        Co-evaluation               End of Session   Activity Tolerance: Patient limited by pain Patient left: in bed;with call bell/phone within reach Nurse Communication: Mobility status         Time: 7494-4967 PT Time Calculation (min) (ACUTE ONLY): 22 min   Charges:   PT Evaluation $Initial PT Evaluation Tier I: 1 Procedure     PT G CodesEllouise Newer 04/01/2015, 1:28 PM  Elayne Snare, [place]

## 2015-04-01 NOTE — Progress Notes (Addendum)
Called MD office to notify him that pt hemoglobin on 04/01/15 is 8.0. He rounded before lab work resulted. Pt is alert and oriented, VSS, urine output is slow and will continue to monitor. No new orders at this time. Awaiting for MD to return page

## 2015-04-01 NOTE — Care Management (Signed)
Utilization review completed. Azra Abrell, RN Case Manager [phone]. 

## 2015-04-01 NOTE — Progress Notes (Signed)
Subjective: 1 Day Post-Op Procedure(s) (LRB): LEFT HIP REVISION OF HEMIARTHROPLASTY TO TOTAL HIP ARTHROPLASTY ANTERIOR APPROACH (Left) Patient reports pain as moderate.  Labs not back yet.  Objective: Vital signs in last 24 hours: Temp:  [97.8 F (36.6 C)-98.4 F (36.9 C)] 97.9 F (36.6 C) (10/12 0500) Pulse Rate:  [56-110] 94 (10/12 0500) Resp:  [14-29] 16 (10/12 0500) BP: (80-147)/(39-89) 92/51 mmHg (10/12 0500) SpO2:  [76 %-100 %] 100 % (10/12 0500)  Intake/Output from previous day: 10/11 0701 - 10/12 0700 In: 1700 [I.V.:1450; IV Piggyback:250] Out: 1500 [Urine:1150; Blood:350] Intake/Output this shift:     Recent Labs  03/30/15 0840  HGB 10.4*    Recent Labs  03/30/15 0840  WBC 9.4  RBC 4.05  HCT 32.5*  PLT 608*    Recent Labs  03/30/15 0840  NA 132*  K 3.9  CL 100*  CO2 24  BUN 9  CREATININE 0.51  GLUCOSE 79  CALCIUM 8.5*   No results for input(s): LABPT, INR in the last 72 hours.  Sensation intact distally Intact pulses distally Dorsiflexion/Plantar flexion intact Incision: dressing C/D/I  Assessment/Plan: 1 Day Post-Op Procedure(s) (LRB): LEFT HIP REVISION OF HEMIARTHROPLASTY TO TOTAL HIP ARTHROPLASTY ANTERIOR APPROACH (Left) Up with therapy  Discharge to home by the end of the week.  Travoris Bushey Y 04/01/2015, 7:14 AM

## 2015-04-01 NOTE — Op Note (Signed)
[name], [name] NO.:  [id number]  MEDICAL RECORD NO.:  [id number]  LOCATION:  5N15C                        FACILITY:  [facility]  PHYSICIAN:  Lind Guest. 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  PROCEDURE:  Conversion of left hip hemiarthroplasty to left total hip arthroplasty.  IMPLANTS:  DePuy Sector Gription acetabular component size 48 with a single screw, size 32 +4 polyethylene liner, size 32 +5 metal hip ball.  SURGEON:  Lind Guest. Ninfa Linden, MD.  ASSISTANT:  Erskine Emery, PA-C.  ANESTHESIA:  General.  ANTIBIOTICS:  900 mg of IV clindamycin.  BLOOD LOSS:  350 mL.  COMPLICATIONS:  None.  INDICATIONS:  [name] is a 49 year old well known to me.  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  In August, she sustained a femoral neck fracture.  We performed a left hip hemiarthroplasty through direct anterior approach to stabilize her hip and gave her some quality of life.  At the time of surgery, I felt that she was stable.  I actually had her leg length long and it was difficult to reduce her hip.  I saw her regularly in postop and about 4 weeks postoperative, she sustained a hard mechanical fall out of bed and dislocated the hip.  I took her to the operating room, and performed a closed reduction under general anesthesia reducing this into the pelvis. It was quite difficult to reduce in the pelvis but we got it reduced once again and felt incredibly stable.  Since then, she has dislocated again.  She is very cachectic individual and near the end of her life and with almost essentially no muscle tone.  I feel that she is coming out posteriorly  and not anteriorly even though we did anterior approach. Certainly, I believe there was disruption of the posterior tissues.  I obtained a preoperative CT scan and did not find any fracture of the pelvis or the acetabulum.  At this point, due to recurrent dislocations, I recommended a conversion of her hemiarthroplasty to a total hip arthroplasty, with being able to do this under direct fluoroscopy through direct anterior approach will be able to assess her stability in the OR.  This has been well understood by her and she does wish to proceed with surgery.  PROCEDURE DESCRIPTION:  After informed consent was obtained, appropriate left hip was marked, she was brought to the operating room.  General anesthesia was obtained while she was on a stretcher and traction boots were placed on both of her feet.  Next, I placed her supine on the Hana fracture table and assessed her hips under fluoroscopy.  Again, her hip was already dislocated so it took a lot of adduction of her left hip and with traction we were able to finally reduce the hip which was certainly quite difficult to do.  Under fluoroscopy, I assessed the hip and I had a significant amount of trouble trying to even dislocate  it.  We then prepped her left hip with DuraPrep and sterile drapes.  Time-out was called.  She was identified as the correct patient, correct left hip.  I then made an incision through her previous incision anteriorly and dissected down to the hip capsule and we could see this disrupted from her dislocation and I removed sutures around the hip capsule.  We then attempted to dislocate her hemiarthroplasty and it was incredibly difficult to do so.  We were finally able to get it dislocated which again makes me question how she is coming out which I believe is probably more posterior.  We were able to knock the bipolar femoral head off and assessed the stem.  The femoral component was well seated. There was no  motion to it at all.  I assessed the greater trochanter and it is intact as well.  We then decided to proceed with a total hip arthroplasty.  We cleaned the acetabular debris including remnants of the acetabular labrum.  I then reamed under direct fluoroscopy from a size 42 up to a size 48 watching how we placed the reamer under fluoroscopy as well to obtain our anteversion and abduction and depth. Once we reduced so, I placed the real size 48 DePuy Sector Gription acetabular component in a single screw and it was stable.  We placed the real 32 +4 liner to give her a little bit more offset and tightness.  We then trialed a 32 +1 hip ball and brought the leg back over and up and reduced it in the pelvis.  I put her through internal rotation to 80 degrees, external rotation to 105 degrees and could not dislocate her. I brought the leg back down about 45 degrees of extension and did the same maneuver but could not dislocate her.  I assessed it under fluoroscopy and the hip appeared stable.  Her leg lengths showed she was just slightly long on that side, so I decided to go with even longer hip ball to make her even tighter and increased her offset more.  We dislocated the hip and removed the trial hip ball.  There was a 32 + 1 and we put the real 32 +5 metal hip ball.  We then reduced this in the pelvis which was hard with traction and internal rotation, but we were able to reduce it with a nice sunk and it was incredibly tight.  I put her through all extremes of internal and external rotation with no Shuck.  The hip felt stable and we were confident that hopefully we have secured and stabilized the hip.  We then irrigated the soft tissue with normal saline solution using pulsatile lavage.  I was able to once again close the joint capsule with interrupted #1 Ethibond suture followed by a running #1 Vicryl in the tensor fascia, 2-0 Vicryl in subcutaneous tissue, interrupted staples on the skin.   Xeroform and Aquacel dressing was applied.  She was taken off the Hana table, awakened, extubated, and taken to recovery room in stable condition.  All final counts were correct.  There were no complications noted.  Of note, Benita Stabile, PA-C assisted during the entire case.  His assistance was crucial in facilitating all aspects of this case.     Lind Guest. Ninfa Linden, M.D.     CYB/MEDQ  D:  03/31/2015  T:  04/01/2015  Job:  [id number]

## 2015-04-01 NOTE — Clinical Documentation Improvement (Signed)
Orthopedic  Abnormal Results:  BMI= 15.97; "Cachectic" per medical record.  Possible Clinical Conditions associated with above indicators  Malnutrition- Severe (3rd degree)  Malnutrition- Moderate (2nd degree)  Malnutrition- Mild (1st degree)  Other Condition  Cannot Clinically Determine  Please exercise your independent, professional judgment when responding. A specific answer is not anticipated or expected.   Thank You,  Rolm Gala, RN, [place] [phone]

## 2015-04-01 NOTE — Care Management Note (Signed)
Case Management Note  Patient Details  Name: [name] MRN: [id number] Date of Birth: December 30, 1965  Subjective/Objective:  49 yr old female s/p   left hip revision to a left total hip arthroplasty, anterior approach..                 Action/Plan:  Case manager spoke with patient and family concerning home health and DME needs. It is stated that she is active with [facility]. Case manager contacted Jeannetta Nap, [facility] Care Liaison to inform her of patient's discharge needs. Ms. Heuer has walker, rollator, wheelchair lift chair and all possible DME . Has family support at discharge.   Expected Discharge Date:    04/02/15              Expected Discharge Plan:  [place]  In-House Referral:  NA  Discharge planning Services  CM Consult  Post Acute Care Choice:  Home Health, Resumption of Svcs/PTA Provider Choice offered to:  Patient  DME Arranged:  N/A DME Agency:     HH Arranged:  RN, PT, OT, Nurse's Aide, Social Work [employer] Agency:  [facility]  Status of Service:  In process, will continue to follow  Medicare Important Message Given:    Date Medicare IM Given:    Medicare IM give by:    Date Additional Medicare IM Given:    Additional Medicare Important Message give by:     If discussed at [place] of Stay Meetings, dates discussed:    Additional Comments:  Ninfa Meeker, RN 04/01/2015, 3:19 PM

## 2015-04-02 ENCOUNTER — Inpatient Hospital Stay (HOSPITAL_COMMUNITY): Payer: 59

## 2015-04-02 LAB — CBC
HEMATOCRIT: 32.3 % — AB (ref 36.0–46.0)
Hemoglobin: 10.7 g/dL — ABNORMAL LOW (ref 12.0–15.0)
MCH: 27.1 pg (ref 26.0–34.0)
MCHC: 33.1 g/dL (ref 30.0–36.0)
MCV: 81.8 fL (ref 78.0–100.0)
PLATELETS: 491 10*3/uL — AB (ref 150–400)
RBC: 3.95 MIL/uL (ref 3.87–5.11)
RDW: 17.9 % — AB (ref 11.5–15.5)
WBC: 11 10*3/uL — AB (ref 4.0–10.5)

## 2015-04-02 LAB — TYPE AND SCREEN
ABO/RH(D): O POS
Antibody Screen: NEGATIVE
UNIT DIVISION: 0
Unit division: 0

## 2015-04-02 NOTE — Plan of Care (Signed)
Problem: Phase II Progression Outcomes Goal: Pain controlled Outcome: Progressing Pt has been resting through the night, had some complaints of pain. Received meds as ordered with + effects. Goal: Foley discontinued Outcome: Completed/Met Date Met:  04/02/15 Day shift  Problem: Phase III Progression Outcomes Goal: IV changed to normal saline lock Outcome: Progressing TKO

## 2015-04-02 NOTE — Progress Notes (Signed)
Physical Therapy Treatment Patient Details Name: [name] MRN: [id number] DOB: May 12, 1966 Today's Date: 04/02/2015    History of Present Illness 49 y.o. female s/p LEFT HIP REVISION OF HEMIARTHROPLASTY TO TOTAL HIP ARTHROPLASTY ANTERIOR APPROACH. 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    PT Comments    POD # 2 pt in bed curled in fetal position.  Assisted OOB to attempt amb.  Increased c/o upper ABD/lower frontal chest pain to 9/10 inhibited further progression.  RN called for pain meds.  Positioned in recliner and performed some AAROM TE's. Pt plans to return to home with her brother.  When asked about steps in the house, pt stated she will use the "chair left".    Follow Up Recommendations  Home health PT;Supervision for mobility/OOB (plans to return home with brother as prior)     Equipment Recommendations       Recommendations for Other Services       Precautions / Restrictions Precautions Precaution Comments: Previously posterior-hip precautions. None listed this admission. Restrictions Weight Bearing Restrictions: No LLE Weight Bearing: Weight bearing as tolerated    Mobility  Bed Mobility Overal bed mobility: Needs Assistance Bed Mobility: Supine to Sit     Supine to sit: Min assist     General bed mobility comments: Min assist for LLE support out of the bed with increased time and use of bed pad to complete scooting  Transfers Overall transfer level: Needs assistance Equipment used: Rolling walker (2 wheeled)   Sit to Stand: Min assist         General transfer comment: 50% VC's on proper hand placement and to avoid hip flex >90 degrees.  Increased c/o ABD pain.   Ambulation/Gait Ambulation/Gait assistance: Min assist Ambulation Distance (Feet): 8 Feet Assistive device: Rolling walker (2 wheeled) Gait Pattern/deviations: Step-to pattern;Decreased stance time - left;Trunk flexed Gait velocity: decreased   General Gait  Details: limited anb distance due to increased Abd/chest pain (cancer)  recliner brought to pt.    Stairs            Wheelchair Mobility    Modified Rankin (Stroke Patients Only)       Balance                                    Cognition Arousal/Alertness: Awake/alert Behavior During Therapy: WFL for tasks assessed/performed Overall Cognitive Status: Within Functional Limits for tasks assessed                      Exercises  10 reps B LE AP 10 reps B LE knee presses 10 reps L LE AAROM HS    General Comments        Pertinent Vitals/Pain Pain Assessment: 0-10 Pain Score: 9  Pain Location: upper ABD "cancer" Pain Descriptors / Indicators: Cramping;Grimacing;Stabbing Pain Intervention(s): Monitored during session;Repositioned;Limited activity within patient's tolerance    Home Living                      Prior Function            PT Goals (current goals can now be found in the care plan section) Progress towards PT goals: Progressing toward goals    Frequency  7X/week    PT Plan      Co-evaluation             End  of Session Equipment Utilized During Treatment: Gait belt Activity Tolerance: Patient limited by pain Patient left: in chair;with call bell/phone within reach     Time: 1105-1130 PT Time Calculation (min) (ACUTE ONLY): 25 min  Charges:  $Gait Training: 8-22 mins $Therapeutic Activity: 8-22 mins                    G Codes:      Nathanial Rancher 04-03-15, 1:21 PM

## 2015-04-02 NOTE — Progress Notes (Signed)
Subjective: 2 Days Post-Op Procedure(s) (LRB): LEFT HIP REVISION OF HEMIARTHROPLASTY TO TOTAL HIP ARTHROPLASTY ANTERIOR APPROACH (Left) Patient reports pain as moderate.    Objective: Vital signs in last 24 hours: Temp:  [97.9 F (36.6 C)-99.5 F (37.5 C)] 98.5 F (36.9 C) (10/13 0711) Pulse Rate:  [84-93] 84 (10/13 0711) Resp:  [15-18] 16 (10/13 0711) BP: (93-111)/(56-72) 110/72 mmHg (10/13 0711) SpO2:  [99 %-100 %] 99 % (10/13 0711)  Intake/Output from previous day: 10/12 0701 - 10/13 0700 In: 540 [P.O.:540] Out: 575 [Urine:575] Intake/Output this shift:     Recent Labs  03/30/15 0840 04/01/15 0611 04/02/15 0554  HGB 10.4* 8.0* 10.7*    Recent Labs  04/01/15 0611 04/02/15 0554  WBC 12.4* 11.0*  RBC 3.04* 3.95  HCT 24.5* 32.3*  PLT 618* 491*    Recent Labs  03/30/15 0840  NA 132*  K 3.9  CL 100*  CO2 24  BUN 9  CREATININE 0.51  GLUCOSE 79  CALCIUM 8.5*   No results for input(s): LABPT, INR in the last 72 hours.   Left lower extremity: Dorsiflexion/Plantar flexion intact Incision: dressing C/D/I Compartment soft  Assessment/Plan: 2 Days Post-Op Procedure(s) (LRB): LEFT HIP REVISION OF HEMIARTHROPLASTY TO TOTAL HIP ARTHROPLASTY ANTERIOR APPROACH (Left) Up with therapy  HgB improved s/p transfusion. Plan discharge home later this week.  [name], [place] 04/02/2015, 8:21 AM

## 2015-04-02 NOTE — Clinical Documentation Improvement (Signed)
Orthopedic  Abnormal Lab/Test Results:  03/30/15: H/H= 10.4/32.5; 04/01/15: H/H= 8.0/24.5  Possible Clinical Conditions associated with below indicators  Acute Blood Loss Anemia  Other Condition  Cannot Clinically Determine   Supporting Information: 03/30/15 EBL during surgery = 350 cc  Treatment Provided: 1 unit PRBC's given; 04/02/15: H/H increased to 10.7/32.3   Please exercise your independent, professional judgment when responding. A specific answer is not anticipated or expected.   Thank You,  Rolm Gala, RN, [place] [place] [phone]

## 2015-04-02 NOTE — Consult Note (Signed)
Reason for Consult: Difficulty swallowing Referring Physician: Mcarthur Rossetti, *  [name] is an 49 y.o. female.  HPI: End-stage lung cancer, in the hospital because of a problem with her hip reconstruction. She was going to call our office couple weeks ago but because of the hip she never got the chance. She has had trouble for couple of months with difficulty swallowing, regurgitation of liquids, weakened voice. She has been losing weight because of the difficulty swallowing. She had pneumonia back in March.  Past Medical History  Diagnosis Date  . Cancer ([place])     left bronchioles  . Radiation 11/24/14-12/05/14    Left ribs 30 Gy in 10 fractions    Past Surgical History  Procedure Laterality Date  . Esophagogastroduodenoscopy N/A 09/14/2014    Procedure: ESOPHAGOGASTRODUODENOSCOPY (EGD);  Surgeon: Teena Irani, MD;  Location: [facility];  Service: Endoscopy;  Laterality: N/A;  . Total hip arthroplasty Left 02/10/2015    Procedure:   LEFT HIP BIPOLAR HEMI ARTHROPLASTY;  Surgeon: Mcarthur Rossetti, MD;  Location: [facility];  Service: Orthopedics;  Laterality: Left;  . Hip closed reduction Left 03/03/2015    Procedure: CLOSED MANIPULATION HIP;  Surgeon: Mcarthur Rossetti, MD;  Location: [facility];  Service: Orthopedics;  Laterality: Left;  closed reduction left hip with intraprocedural incision repair and dressing  . Hip closed reduction Left 03/26/2015    Procedure: CLOSED REDUCTION HIP;  Surgeon: Marybelle Killings, MD;  Location: [facility];  Service: Orthopedics;  Laterality: Left;  . Total hip arthroplasty Left 03/31/2015    Procedure: LEFT HIP REVISION OF HEMIARTHROPLASTY TO TOTAL HIP ARTHROPLASTY ANTERIOR APPROACH;  Surgeon: Mcarthur Rossetti, MD;  Location: [facility];  Service: Orthopedics;  Laterality: Left;    Family History  Problem Relation Age of Onset  . Cancer Mother   . Heart failure Father     Social History:  reports that she quit smoking about 6 months  ago. She does not have any smokeless tobacco history on file. She reports that she does not drink alcohol or use illicit drugs.  Allergies:  Allergies  Allergen Reactions  . Penicillins Rash    Has patient had a PCN reaction causing immediate rash, facial/tongue/throat swelling, SOB or lightheadedness with hypotension: no Has patient had a PCN reaction causing severe rash involving mucus membranes or skin necrosis: unknown Has patient had a PCN reaction that required hospitalization unknown Has patient had a PCN reaction occurring within the last 10 years: no If all of the above answers are "NO", then may proceed with Cephalosporin use.     Medications: Reviewed  Results for orders placed or performed during the hospital encounter of 03/30/15 (from the past 48 hour(s))  Blood gas, arterial     Status: Abnormal   Collection Time: 03/31/15  9:02 PM  Result Value Ref Range   O2 Content 10.0 L/min   Delivery systems NASAL CANNULA    pH, Arterial 7.401 7.350 - 7.450   pCO2 arterial 34.2 (L) 35.0 - 45.0 mmHg   pO2, Arterial 214 (H) 80.0 - 100.0 mmHg   Bicarbonate 20.8 20.0 - 24.0 mEq/L   TCO2 21.8 0 - 100 mmol/L   Acid-base deficit 3.2 (H) 0.0 - 2.0 mmol/L   O2 Saturation 99.4 %   Patient temperature 98.6    Collection site LEFT RADIAL    Drawn by [id number]    Sample type ARTERIAL DRAW    Allens test (pass/fail) PASS PASS  CBC  Status: Abnormal   Collection Time: 04/01/15  6:11 AM  Result Value Ref Range   WBC 12.4 (H) 4.0 - 10.5 K/uL    Comment: REPEATED TO VERIFY   RBC 3.04 (L) 3.87 - 5.11 MIL/uL   Hemoglobin 8.0 (L) 12.0 - 15.0 g/dL    Comment: REPEATED TO VERIFY   HCT 24.5 (L) 36.0 - 46.0 %   MCV 80.6 78.0 - 100.0 fL    Comment: CONSISTENT WITH PREVIOUS RESULT   MCH 26.3 26.0 - 34.0 pg   MCHC 32.7 30.0 - 36.0 g/dL   RDW 19.8 (H) 11.5 - 15.5 %    Comment: CONSISTENT WITH PREVIOUS RESULT   Platelets 618 (H) 150 - 400 K/uL    Comment: REPEATED TO VERIFY CONSISTENT WITH  PREVIOUS RESULT   Type and screen [place]     Status: None   Collection Time: 04/01/15 10:46 AM  Result Value Ref Range   ABO/RH(D) O POS    Antibody Screen NEG    Sample Expiration 04/04/2015    Unit Number [id number]    Blood Component Type RBC LR PHER2    Unit division 00    Status of Unit ISSUED,FINAL    Transfusion Status OK TO TRANSFUSE    Crossmatch Result Compatible    Unit Number [id number]    Blood Component Type RBC LR PHER2    Unit division 00    Status of Unit ISSUED,FINAL    Transfusion Status OK TO TRANSFUSE    Crossmatch Result Compatible   Prepare RBC     Status: None   Collection Time: 04/01/15 10:46 AM  Result Value Ref Range   Order Confirmation ORDER PROCESSED BY BLOOD BANK   ABO/Rh     Status: None   Collection Time: 04/01/15 10:46 AM  Result Value Ref Range   ABO/RH(D) O POS   CBC     Status: Abnormal   Collection Time: 04/02/15  5:54 AM  Result Value Ref Range   WBC 11.0 (H) 4.0 - 10.5 K/uL   RBC 3.95 3.87 - 5.11 MIL/uL   Hemoglobin 10.7 (L) 12.0 - 15.0 g/dL    Comment: POST TRANSFUSION SPECIMEN   HCT 32.3 (L) 36.0 - 46.0 %   MCV 81.8 78.0 - 100.0 fL   MCH 27.1 26.0 - 34.0 pg   MCHC 33.1 30.0 - 36.0 g/dL   RDW 17.9 (H) 11.5 - 15.5 %   Platelets 491 (H) 150 - 400 K/uL    Dg Pelvis Portable  03/31/2015  CLINICAL DATA:  Left total hip arthroplasty revision. EXAM: PORTABLE PELVIS 1-2 VIEWS COMPARISON:  Prior studies FINDINGS: Left total hip arthroplasty identified. No complicating features are noted. Postoperative changes are present. IMPRESSION: Left total hip arthroplasty without complicating features. Electronically Signed   By: Margarette Canada M.D.   On: 03/31/2015 20:23   Dg Chest Port 1 View  03/31/2015  CLINICAL DATA:  Increase shortness of Breath, history of stage IV lung carcinoma EXAM: PORTABLE CHEST - 1 VIEW COMPARISON:  02/09/2015 FINDINGS: There is persistent opacification of the left hemi thorax. The right lung  is well aerated. Nodular density is noted overlying the right lung base likely representing a nipple shadow. Destruction of the ninth left rib posteriorly is noted which has progressed in the interval from the prior exam. The lateral aspect of the left eighth rib is also destroyed but this is stable from the prior exam. 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  No definitive acute abnormality is seen. Stable opacification of the left hemi thorax. Electronically Signed   By: Inez Catalina M.D.   On: 03/31/2015 20:52   Dg Hip Operative Unilat With Pelvis Left  03/31/2015  CLINICAL DATA:  Revision of left hip arthroplasty EXAM: OPERATIVE left HIP (WITH PELVIS IF PERFORMED) 2 VIEWS TECHNIQUE: Fluoroscopic spot image(s) were submitted for interpretation post-operatively. COMPARISON:  03/30/2015 FINDINGS: There is been revision of the left hip prosthesis. An acetabular component and femoral component are now seen with fixation screw in the superior aspect of the acetabulum. No acute bony abnormality is noted. IMPRESSION: Status post left hip prosthesis revision Electronically Signed   By: Inez Catalina M.D.   On: 03/31/2015 19:30    OOI:[id number] except as listed in admit H&P  Blood pressure 110/72, pulse 84, temperature 98.5 F (36.9 C), temperature source Oral, resp. rate 16, SpO2 100 %.  PHYSICAL EXAM: Overall appearance:  Cachectic woman, in no distress. Voice is weak and slightly breathy. Head:  Normocephalic, atraumatic. Ears: External auditory canals are clear; tympanic membranes are intact in the middle ears are free of any effusion. Nose: External nose is healthy in appearance. Internal nasal exam free of any lesions or obstruction. Oral Cavity:  There are no mucosal lesions or masses identified. Mucous membranes are dry. Oral Pharynx/Hypopharynx/Larynx: no signs of any mucosal lesions or masses identified.  Larynx/Hypopharynx: Indirect exam is limited due to  excessive gag reflex. No obvious neoplasm identified. Unable to visualize the mobility of the cords. Neuro:  No other identifiable neurologic deficits. Neck: No palpable neck masses.  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 Recommend barium swallow to make sure there is no additional pathology in the esophagus. She may be a significant aspiration risk and might need a gastrostomy feeding tube. I will discuss this further after the barium swallow.  [name] 04/02/2015, 12:22 PM

## 2015-04-02 NOTE — Progress Notes (Signed)
Patient ID: [name], female   DOB: 1966/01/13, 49 y.o.   MRN: [id number] Doing well overall. Has been up with therapy.  Left hip more stable.  Responded well with transfusion.  Likely discharge to home tomorrow.

## 2015-04-03 NOTE — Progress Notes (Signed)
RN noticed that pt was lethargic, would arouse but would drift back off to sleep. Would respond verbally to questions, but minimally compared to previous day RN was with pt. Had not received pain medication since 8:22 am. VSS stable, RR 18, BP 111/77. When brother arrived shortly after, immediately asked if pt had received Ativan. Night shift RN gave 1 mg PO Ativan at 6:21 am, as well as last night. Brother said he had stopped giving her ativan at home because it made her like "a vegetable" and she would sleep for most of the day. Will make MD aware. Pt is supposed to d/c today via EMS. RN will continue to monitor.   Raquel James  04/03/2015 1:57 PM

## 2015-04-03 NOTE — Progress Notes (Signed)
After discussion with pt's brother and Dr. Ninfa Linden, pt will be staying overnight and d/c tomorrow after effects of Ativan are worn off. Will still have EMS take pt home to brothers house. CM and SW aware of plan. Ativan will not be given again this admission.

## 2015-04-03 NOTE — Progress Notes (Signed)
Patient ID: [name], female   DOB: 1965/09/12, 49 y.o.   MRN: [id number] No acute changes from Ortho standpoint.  Left hip stable.  I appreciate Dr. Constance Holster coming by to see her as a consult.  Will discharge her to home this afternoon and can follow-up as an outpatient with me and Dr. Constance Holster.

## 2015-04-03 NOTE — Discharge Summary (Signed)
Patient ID: [name] MRN: [id number] DOB/AGE: 1966/04/17 49 y.o.  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 Cancer ([place])     left bronchioles  . Radiation 11/24/14-12/05/14    Left ribs 30 Gy in 10 fractions    Surgeries: Procedure(s): LEFT HIP REVISION OF HEMIARTHROPLASTY TO TOTAL HIP ARTHROPLASTY ANTERIOR APPROACH on 03/30/2015 - 03/31/2015   Consultants: Treatment Team:  Izora Gala, MD  Discharged Condition: Improved  Hospital Course: [name] is an 49 y.o. female who was admitted 03/30/2015 for operative treatment ofRecurrent dislocation of hip joint prosthesis ([place]). Patient has severe unremitting pain that affects sleep, daily activities, and work/hobbies. After pre-op clearance the patient was taken to the operating room on 03/30/2015 - 03/31/2015 and underwent  Procedure(s): LEFT HIP REVISION OF HEMIARTHROPLASTY TO [place].    Patient was given perioperative antibiotics: Anti-infectives    Start     Dose/Rate Route Frequency Ordered Stop   03/31/15 1815  clindamycin (CLEOCIN) IVPB 900 mg     900 mg 100 mL/hr over 30 Minutes Intravenous STAT 03/31/15 1813 03/31/15 1845       Patient was given sequential compression devices, early ambulation, and chemoprophylaxis to prevent DVT.  Patient benefited maximally from hospital stay and there were no complications.    She did have a consultation by Dr. Constance Holster, ENT Specialist, due to swallowing difficulties and a barium swallow was ordered.  Recent vital signs: Patient Vitals for the past 24 hrs:  BP Temp Temp src Pulse Resp SpO2  04/03/15 0557 110/75 mmHg 98.6 F (37 C) - 84 16 97 %  04/02/15 2032 124/79 mmHg 98 F (36.7 C) - 85 16 100  %  04/02/15 1300 106/74 mmHg 97.3 F (36.3 C) Oral 81 16 100 %  04/02/15 0823 - - - - - 100 %  04/02/15 0711 110/72 mmHg 98.5 F (36.9 C) Oral 84 16 99 %     Recent laboratory studies:  Recent Labs  04/01/15 0611 04/02/15 0554  WBC 12.4* 11.0*  HGB 8.0* 10.7*  HCT 24.5* 32.3*  PLT 618* 491*     Discharge Medications:     Medication List    TAKE these medications        acetaminophen 325 MG tablet  Commonly known as:  TYLENOL  Take 2 tablets (650 mg total) by mouth every 6 (six) hours as needed for mild pain (or Fever >/= 101).     diclofenac 50 MG EC tablet  Commonly known as:  VOLTAREN  Take 1 tablet by mouth 2 (two) times daily.     DULoxetine 30 MG capsule  Commonly known as:  CYMBALTA  Take 30 mg by mouth daily.     enoxaparin 80 MG/0.8ML injection  Commonly known as:  LOVENOX  ADMINISTER 0.8 ML UNDER THE SKIN DAILY     fentaNYL 100 MCG/HR  Commonly known as:  DURAGESIC - dosed mcg/hr  Place 1 patch (100 mcg total) onto the skin every 3 (three) days.     ferrous sulfate 325 (65 FE) MG EC tablet  Take 1 tablet (325 mg total) by mouth 2 (two) times daily with a meal.     HYDROmorphone 2 MG tablet  Commonly known as:  DILAUDID  Take 2 mg  by mouth every 4 (four) hours as needed for severe pain. 1-2 tabs PO Q 3-4 hours PRN     ipratropium 0.02 % nebulizer solution  Commonly known as:  ATROVENT  Take 2.5 mLs (0.5 mg total) by nebulization 3 (three) times daily.     LIDODERM 5 %  Generic drug:  lidocaine  1 patch. To left ant chest wall, 12 hours on/12 hours off, Remove & Discard patch within 12 hours or as directed by MD     LORazepam 1 MG tablet  Commonly known as:  ATIVAN  Take 1 tablet (1 mg total) by mouth every 6 (six) hours as needed for anxiety.     morphine 60 MG 12 hr tablet  Commonly known as:  MS CONTIN  Take 60 mg by mouth every 8 (eight) hours.     morphine 30 MG 12 hr tablet  Commonly known as:  MS CONTIN  Take 2 tablets by mouth  every 8 (eight) hours. 0600, 1400, 2200     omeprazole 20 MG capsule  Commonly known as:  PRILOSEC  Take 1 capsule (20 mg total) by mouth 2 (two) times daily before a meal.     ondansetron 8 MG tablet  Commonly known as:  ZOFRAN  TAKE 1 TABLET(8 MG) BY MOUTH EVERY 8 HOURS AS NEEDED FOR NAUSEA OR VOMITING     polyethylene glycol packet  Commonly known as:  MIRALAX / GLYCOLAX  Take 17 g by mouth daily as needed for moderate constipation.     senna-docusate 8.6-50 MG tablet  Commonly known as:  Senokot-S  Take 1 tablet by mouth at bedtime.        Diagnostic Studies: Ct Pelvis Wo Contrast  03/30/2015  CLINICAL DATA:  Evaluate for left acetabulum fracture. EXAM: CT PELVIS WITHOUT CONTRAST TECHNIQUE: Multidetector CT imaging of the pelvis was performed following the standard protocol without intravenous contrast. COMPARISON:  None. FINDINGS: The femoral head component of the left hip hardware is dislocated superiorly and posteriorly to the acetabulum. Dystrophic calcifications seen just posterior and medial to the femoral head component appear chronic, presumably related to previous femoral head/neck fracture or subsequent myositis ossificans. There is no acute fracture within the left acetabulum or within the surrounding structures of the left hemipelvis. Sclerotic changes within the left inferior pubic ramus are suggestive of a previous healed fracture. No fracture identified within the sacrum or right hemipelvis. No evidence of an acute fracture within the proximal left femur at the level of the prosthesis Small amount of free fluid is present within the pelvis. Soft tissues of the pelvis are otherwise unremarkable. Ill-defined edema is present within the subcutaneous soft tissues adjacent to the left hip without circumscribed hematoma. IMPRESSION: 1. Femoral head component of the left hip replacement hardware is displaced superiorly and posteriorly relative to the left acetabulum. 2. No acute  acetabular fracture seen. 3. Several dystrophic-appearing calcifications adjacent to the left acetabulum and proximal left femur appear chronic and are presumed to be related to previous femur fracture and/or subsequent myositis ossificans. 4. Associated ill-defined edema within the subcutaneous soft tissues lateral to the left hip. No circumscribed fluid collection or hematoma. 5. Small amount of free fluid within the lower pelvis, most likely physiologic in nature. Electronically Signed   By: Franki Cabot M.D.   On: 03/30/2015 16:22   Dg Esophagus  04/02/2015  CLINICAL DATA:  Dysphasia.  History of stage IV lung cancer. EXAM: ESOPHOGRAM/BARIUM SWALLOW TECHNIQUE: Single contrast examination was performed using  thin barium. FLUOROSCOPY TIME:  Radiation Exposure Index (as provided by the fluoroscopic device): If the device does not provide the exposure index: Fluoroscopy Time:  1 minutes and 6 seconds Number of Acquired Images: COMPARISON:  Chest CT to 01/07/2015 FINDINGS: There is a long segment of irregular narrowing of the mid thoracic esophagus. This is estimated at 5 cm in length. This could be due to radiation effect. Circumferential tumor is possible but unlikely given the recent CT scans. Endoscopic evaluation may be helpful for further evaluation. The distal esophagus is unremarkable. IMPRESSION: Long segment of irregular narrowing of the midesophagus centered at the level of the carina. This is most likely radiation change. Tumor cannot be excluded but is felt to be less likely given prior CT scans. Endoscopic evaluation may be helpful. Electronically Signed   By: Marijo Sanes M.D.   On: 04/02/2015 16:39   Dg Pelvis Portable  03/31/2015  CLINICAL DATA:  Left total hip arthroplasty revision. EXAM: PORTABLE PELVIS 1-2 VIEWS COMPARISON:  Prior studies FINDINGS: Left total hip arthroplasty identified. No complicating features are noted. Postoperative changes are present. IMPRESSION: Left total hip  arthroplasty without complicating features. Electronically Signed   By: Margarette Canada M.D.   On: 03/31/2015 20:23   Dg Chest Port 1 View  03/31/2015  CLINICAL DATA:  Increase shortness of Breath, history of stage IV lung carcinoma EXAM: PORTABLE CHEST - 1 VIEW COMPARISON:  02/09/2015 FINDINGS: There is persistent opacification of the left hemi thorax. The right lung is well aerated. Nodular density is noted overlying the right lung base likely representing a nipple shadow. Destruction of the ninth left rib posteriorly is noted which has progressed in the interval from the prior exam. The lateral aspect of the left eighth rib is also destroyed but this is stable from the prior exam. 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 No definitive acute abnormality is seen. Stable opacification of the left hemi thorax. Electronically Signed   By: Inez Catalina M.D.   On: 03/31/2015 20:52   Dg Hip Port Unilat With Pelvis 1v Left  03/26/2015  CLINICAL DATA:  Reduction of left femoral head prosthesis dislocation. Initial encounter. EXAM: DG HIP (WITH OR WITHOUT PELVIS) 1V PORT LEFT COMPARISON:  Left hip radiographs performed earlier today at 2:01 a.m. FINDINGS: A single fluoroscopic C-arm image demonstrates successful reduction of the patient's left femoral head prosthesis dislocation. There is no definite evidence of fracture. The distal aspect of the prosthesis is not imaged on this study. IMPRESSION: Successful reduction of left femoral head prosthesis dislocation. Electronically Signed   By: Garald Balding M.D.   On: 03/26/2015 04:22   Dg Hip Operative Unilat With Pelvis Left  03/31/2015  CLINICAL DATA:  Revision of left hip arthroplasty EXAM: OPERATIVE left HIP (WITH PELVIS IF PERFORMED) 2 VIEWS TECHNIQUE: Fluoroscopic spot image(s) were submitted for interpretation post-operatively. COMPARISON:  03/30/2015 FINDINGS: There is been revision of the left hip prosthesis. An acetabular  component and femoral component are now seen with fixation screw in the superior aspect of the acetabulum. No acute bony abnormality is noted. IMPRESSION: Status post left hip prosthesis revision Electronically Signed   By: Inez Catalina M.D.   On: 03/31/2015 19:30   Dg Hip Unilat With Pelvis 2-3 Views Left  03/30/2015  CLINICAL DATA:  Suspected dislocation of the prosthetic left hip EXAM: DG HIP (WITH OR WITHOUT PELVIS) 2-3V LEFT COMPARISON:  Post reduction film of March 26, 2015 FINDINGS: The prosthetic left femoral  head lies superior to the acetabulum consistent with acute re- dislocation. The native bone exhibits no acute abnormality. IMPRESSION: The patient has sustained superior dislocation of the prosthetic left hip. Electronically Signed   By: David  Martinique M.D.   On: 03/30/2015 09:17   Dg Hip Unilat With Pelvis 2-3 Views Left  03/26/2015  CLINICAL DATA:  Head physical rehab for hip today. May have dislocated hip back at home. EXAM: DG HIP (WITH OR WITHOUT PELVIS) 2-3V LEFT COMPARISON:  None. FINDINGS: There is superior dislocation of the bipolar left hip hemiarthroplasty. No fracture is evident. IMPRESSION: Dislocation of the left hip prosthesis. Electronically Signed   By: Andreas Newport M.D.   On: 03/26/2015 02:39    Disposition: 01-Home or Self Care      Discharge Instructions    Discharge patient    Complete by:  As directed            Follow-up Information    Follow up with [facility].   Why:  Someone from [facility] will contact you concerning start date and time for home health.   Contact information:   [address] [place] [address] [phone]       Follow up with Mcarthur Rossetti, MD. Schedule an appointment as soon as possible for a visit in 2 weeks.   Specialty:  Orthopedic Surgery   Contact information:   [place] [address] [phone]       Follow up with Izora Gala, MD. Schedule an  appointment as soon as possible for a visit in 1 week.   Specialty:  Otolaryngology   Contact information:   [address] [place] [address] [phone]        Signed: Mcarthur Rossetti 04/03/2015, 6:57 AM

## 2015-04-03 NOTE — Discharge Instructions (Signed)
Can be up with therapy as tolerated. Can leave your current dressing in place and get it wet as needed.

## 2015-04-03 NOTE — Progress Notes (Signed)
Physical Therapy Treatment Patient Details Name: [name] MRN: [id number] DOB: 14-Dec-1965 Today's Date: 04/03/2015    History of Present Illness 49 y.o. female s/p LEFT HIP REVISION OF HEMIARTHROPLASTY TO TOTAL HIP ARTHROPLASTY ANTERIOR APPROACH. 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    PT Comments    Pt very limited this am d/t reports of pain; pt cries tears throughout, she has been premedicated;  Pt reports she has 3 steps to get into her house but that her brother (who is her caregiver) will carry her up the steps; she has a chair lift for the steps going up to second level; she will likely need extensive assist for car transfers today--her brother is not present for PT this am but she reports he can assist as needed;  Follow Up Recommendations  Supervision/Assistance - 24 hour;Home health PT     Equipment Recommendations  None recommended by PT    Recommendations for Other Services       Precautions / Restrictions Precautions Precautions: Fall Restrictions Weight Bearing Restrictions: No LLE Weight Bearing: Weight bearing as tolerated    Mobility  Bed Mobility Overal bed mobility: Needs Assistance Bed Mobility: Supine to Sit;Sit to Supine     Supine to sit: Min assist;Mod assist Sit to supine: Mod assist   General bed mobility comments: assist for trunk and LEs, pt does not attempt to self assist very much; she has been medicated but cries reporting severe pain throughout session; pt only wants to return to bed, not willing to get to recliner or  even sit EOB for repiratory tx  Transfers                 General transfer comment: pt declines  Ambulation/Gait                 Stairs            Wheelchair Mobility    Modified Rankin (Stroke Patients Only)       Balance                                    Cognition Arousal/Alertness: Lethargic (arouses easily) Behavior During Therapy:  Flat affect Overall Cognitive Status: No family/caregiver present to determine baseline cognitive functioning (pt verbalizes very little, cries d/t pain throughout)                      Exercises      General Comments        Pertinent Vitals/Pain Pain Assessment: Faces Faces Pain Scale: Hurts even more Pain Location: lower abd and L hip Pain Descriptors / Indicators: Constant Pain Intervention(s): Limited activity within patient's tolerance;Monitored during session;Repositioned;Premedicated before session    Home Living                      Prior Function            PT Goals (current goals can now be found in the care plan section) Acute Rehab PT Goals Patient Stated Goal: does not state PT Goal Formulation: With patient Time For Goal Achievement: 04/08/15 Potential to Achieve Goals: Fair Progress towards PT goals: Not progressing toward goals - comment (see note, pain limiting)    Frequency  7X/week    PT Plan Current plan remains appropriate    Co-evaluation  End of Session Equipment Utilized During Treatment: Gait belt Activity Tolerance: Patient limited by pain Patient left: in bed;with call bell/phone within reach     Time: 0912-0930 PT Time Calculation (min) (ACUTE ONLY): 18 min  Charges:  $Therapeutic Activity: 8-22 mins                    G Codes:      Thresia Ramanathan 04-23-2015, 9:34 AM

## 2015-04-03 NOTE — Plan of Care (Signed)
Problem: Consults Goal: Diagnosis- Total Joint Replacement Outcome: Completed/Met Date Met:  04/03/15 Revision Total Hip left

## 2015-04-03 NOTE — Progress Notes (Signed)
Patient ID: [name], female   DOB: Oct 20, 1965, 49 y.o.   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 There was no aspiration and liquid passed nicely.   She is asleep on rounds and I was not able to awaken her.   Recommend she focus her diet on protein shakes and attempt to increase her caloric intake. I will se her back in the office when she is able to come in.

## 2015-04-04 NOTE — Progress Notes (Signed)
Patient ID: [name], female   DOB: 1965-07-07, 48 y.o.   MRN: [id number] More alert this am.  Can be discharged to home today.  Vitals stable and responded well to her transfusion earlier this week that was done to treat acute blood loss anemia.

## 2015-04-04 NOTE — Progress Notes (Signed)
Assisted nursing student to obtain medications from the [facility]. She will administer to the patient while her nursing instructor observes/ supervises.

## 2015-04-04 NOTE — Clinical Social Work Note (Signed)
Clinical Social Worker has arranged transportation via [place] for patient to return home with brother, Herbie [place]. CSW confirmed address with pt's brother.   Bedside RN notified.   Clinical Social Worker will sign off for now as social work intervention is no longer needed. Please consult [place] again if new need arises.  Glendon Axe, MSW, LCSWA [phone] 04/04/2015 12:08 PM

## 2015-04-04 NOTE — Progress Notes (Signed)
PTAR arrived to to transport to her brother's house. Provided MS 4 mg IV for pain management for transport. IV access removed.

## 2015-04-04 NOTE — Progress Notes (Signed)
PT Cancellation Note  Patient Details Name: [name] MRN: [id number] DOB: January 25, 1966   Cancelled Treatment:    Reason Eval/Treat Not Completed: Patient declined, anxious about getting home today.  RN made aware.  PT will continue to follow acutely until d/c confirmed.  Thanks,   Barbarann Ehlers. Damita Eppard, PT, DPT [phone]   04/04/2015, 11:35 AM

## 2015-04-04 NOTE — Progress Notes (Signed)
Confirmed by phone with patient's brother that he is home to receive patient from [facility].

## 2015-04-08 ENCOUNTER — Encounter (HOSPITAL_COMMUNITY): Payer: Self-pay | Admitting: Orthopaedic Surgery

## 2015-04-11 ENCOUNTER — Other Ambulatory Visit: Payer: Self-pay | Admitting: Adult Health

## 2015-04-16 ENCOUNTER — Other Ambulatory Visit: Payer: Self-pay | Admitting: Adult Health

## 2015-04-24 ENCOUNTER — Other Ambulatory Visit: Payer: Self-pay

## 2015-04-24 ENCOUNTER — Emergency Department (HOSPITAL_COMMUNITY): Payer: 59

## 2015-04-24 ENCOUNTER — Inpatient Hospital Stay (HOSPITAL_COMMUNITY)
Admission: EM | Admit: 2015-04-24 | Discharge: 2015-05-21 | DRG: 871 | Disposition: E | Payer: 59 | Attending: Internal Medicine | Admitting: Internal Medicine

## 2015-04-24 ENCOUNTER — Encounter (HOSPITAL_COMMUNITY): Payer: Self-pay | Admitting: Emergency Medicine

## 2015-04-24 ENCOUNTER — Other Ambulatory Visit (HOSPITAL_COMMUNITY)
Admission: RE | Admit: 2015-04-24 | Discharge: 2015-04-24 | Disposition: A | Discharge status: Home / Self Care | Payer: 59 | Source: Other Acute Inpatient Hospital | Attending: Family Medicine | Admitting: Family Medicine

## 2015-04-24 DIAGNOSIS — E876 Hypokalemia: Secondary | ICD-10-CM | POA: Diagnosis present

## 2015-04-24 DIAGNOSIS — R6521 Severe sepsis with septic shock: Secondary | ICD-10-CM | POA: Diagnosis present

## 2015-04-24 DIAGNOSIS — I82211 Chronic embolism and thrombosis of superior vena cava: Secondary | ICD-10-CM | POA: Diagnosis present

## 2015-04-24 DIAGNOSIS — J189 Pneumonia, unspecified organism: Secondary | ICD-10-CM | POA: Diagnosis present

## 2015-04-24 DIAGNOSIS — I82210 Acute embolism and thrombosis of superior vena cava: Secondary | ICD-10-CM | POA: Diagnosis present

## 2015-04-24 DIAGNOSIS — C3492 Malignant neoplasm of unspecified part of left bronchus or lung: Secondary | ICD-10-CM | POA: Diagnosis present

## 2015-04-24 DIAGNOSIS — C801 Malignant (primary) neoplasm, unspecified: Secondary | ICD-10-CM

## 2015-04-24 DIAGNOSIS — E43 Unspecified severe protein-calorie malnutrition: Secondary | ICD-10-CM | POA: Diagnosis present

## 2015-04-24 DIAGNOSIS — C3490 Malignant neoplasm of unspecified part of unspecified bronchus or lung: Secondary | ICD-10-CM | POA: Diagnosis present

## 2015-04-24 DIAGNOSIS — I313 Pericardial effusion (noninflammatory): Secondary | ICD-10-CM

## 2015-04-24 DIAGNOSIS — Z681 Body mass index (BMI) 19 or less, adult: Secondary | ICD-10-CM

## 2015-04-24 DIAGNOSIS — Z809 Family history of malignant neoplasm, unspecified: Secondary | ICD-10-CM | POA: Diagnosis not present

## 2015-04-24 DIAGNOSIS — R627 Adult failure to thrive: Secondary | ICD-10-CM | POA: Diagnosis present

## 2015-04-24 DIAGNOSIS — Z87891 Personal history of nicotine dependence: Secondary | ICD-10-CM | POA: Diagnosis not present

## 2015-04-24 DIAGNOSIS — Z515 Encounter for palliative care: Secondary | ICD-10-CM | POA: Diagnosis present

## 2015-04-24 DIAGNOSIS — Z79899 Other long term (current) drug therapy: Secondary | ICD-10-CM

## 2015-04-24 DIAGNOSIS — N390 Urinary tract infection, site not specified: Secondary | ICD-10-CM | POA: Diagnosis present

## 2015-04-24 DIAGNOSIS — A419 Sepsis, unspecified organism: Secondary | ICD-10-CM | POA: Diagnosis not present

## 2015-04-24 DIAGNOSIS — R4182 Altered mental status, unspecified: Secondary | ICD-10-CM | POA: Diagnosis present

## 2015-04-24 DIAGNOSIS — J449 Chronic obstructive pulmonary disease, unspecified: Secondary | ICD-10-CM | POA: Diagnosis present

## 2015-04-24 DIAGNOSIS — Y95 Nosocomial condition: Secondary | ICD-10-CM | POA: Diagnosis present

## 2015-04-24 DIAGNOSIS — J440 Chronic obstructive pulmonary disease with acute lower respiratory infection: Secondary | ICD-10-CM | POA: Diagnosis present

## 2015-04-24 DIAGNOSIS — J9601 Acute respiratory failure with hypoxia: Secondary | ICD-10-CM | POA: Diagnosis present

## 2015-04-24 DIAGNOSIS — Z8249 Family history of ischemic heart disease and other diseases of the circulatory system: Secondary | ICD-10-CM

## 2015-04-24 DIAGNOSIS — Z7189 Other specified counseling: Secondary | ICD-10-CM

## 2015-04-24 DIAGNOSIS — C799 Secondary malignant neoplasm of unspecified site: Secondary | ICD-10-CM | POA: Diagnosis present

## 2015-04-24 DIAGNOSIS — Z66 Do not resuscitate: Secondary | ICD-10-CM | POA: Diagnosis present

## 2015-04-24 DIAGNOSIS — E871 Hypo-osmolality and hyponatremia: Secondary | ICD-10-CM | POA: Diagnosis present

## 2015-04-24 DIAGNOSIS — Z96642 Presence of left artificial hip joint: Secondary | ICD-10-CM | POA: Diagnosis present

## 2015-04-24 DIAGNOSIS — I3131 Malignant pericardial effusion in diseases classified elsewhere: Secondary | ICD-10-CM

## 2015-04-24 DIAGNOSIS — J9600 Acute respiratory failure, unspecified whether with hypoxia or hypercapnia: Secondary | ICD-10-CM | POA: Diagnosis present

## 2015-04-24 LAB — CBC WITH DIFFERENTIAL/PLATELET
BASOS PCT: 0 %
Basophils Absolute: 0 10*3/uL (ref 0.0–0.1)
Basophils Absolute: 0 10*3/uL (ref 0.0–0.1)
Basophils Relative: 0 %
EOS ABS: 0 10*3/uL (ref 0.0–0.7)
Eosinophils Absolute: 0 10*3/uL (ref 0.0–0.7)
Eosinophils Relative: 0 %
Eosinophils Relative: 0 %
HCT: 36.7 % (ref 36.0–46.0)
HEMATOCRIT: 31.8 % — AB (ref 36.0–46.0)
HEMOGLOBIN: 10.6 g/dL — AB (ref 12.0–15.0)
Hemoglobin: 12.2 g/dL (ref 12.0–15.0)
LYMPHS ABS: 0.6 10*3/uL — AB (ref 0.7–4.0)
LYMPHS PCT: 3 %
Lymphocytes Relative: 2 %
Lymphs Abs: 0.5 10*3/uL — ABNORMAL LOW (ref 0.7–4.0)
MCH: 27.7 pg (ref 26.0–34.0)
MCH: 27.5 pg (ref 26.0–34.0)
MCHC: 33.3 g/dL (ref 30.0–36.0)
MCHC: 33.2 g/dL (ref 30.0–36.0)
MCV: 83.2 fL (ref 78.0–100.0)
MCV: 82.8 fL (ref 78.0–100.0)
MONO ABS: 0.6 10*3/uL (ref 0.1–1.0)
Monocytes Absolute: 0.5 10*3/uL (ref 0.1–1.0)
Monocytes Relative: 3 %
Monocytes Relative: 2 %
NEUTROS ABS: 17.5 10*3/uL — AB (ref 1.7–7.7)
Neutro Abs: 25.8 10*3/uL — ABNORMAL HIGH (ref 1.7–7.7)
Neutrophils Relative %: 94 %
Neutrophils Relative %: 96 %
Platelets: 483 10*3/uL — ABNORMAL HIGH (ref 150–400)
Platelets: 529 10*3/uL — ABNORMAL HIGH (ref 150–400)
RBC: 3.82 MIL/uL — ABNORMAL LOW (ref 3.87–5.11)
RBC: 4.43 MIL/uL (ref 3.87–5.11)
RDW: 18.7 % — ABNORMAL HIGH (ref 11.5–15.5)
RDW: 18.6 % — ABNORMAL HIGH (ref 11.5–15.5)
WBC Morphology: INCREASED
WBC: 18.7 10*3/uL — ABNORMAL HIGH (ref 4.0–10.5)
WBC: 26.8 10*3/uL — ABNORMAL HIGH (ref 4.0–10.5)

## 2015-04-24 LAB — COMPREHENSIVE METABOLIC PANEL
ALK PHOS: 257 U/L — AB (ref 38–126)
ALT: 8 U/L — AB (ref 14–54)
ALT: 9 U/L — ABNORMAL LOW (ref 14–54)
AST: 18 U/L (ref 15–41)
AST: 18 U/L (ref 15–41)
Albumin: 1.6 g/dL — ABNORMAL LOW (ref 3.5–5.0)
Albumin: 1.8 g/dL — ABNORMAL LOW (ref 3.5–5.0)
Alkaline Phosphatase: 269 U/L — ABNORMAL HIGH (ref 38–126)
Anion gap: 8 (ref 5–15)
Anion gap: 10 (ref 5–15)
BILIRUBIN TOTAL: 1.2 mg/dL (ref 0.3–1.2)
BUN: 19 mg/dL (ref 6–20)
BUN: 20 mg/dL (ref 6–20)
CALCIUM: 7.7 mg/dL — AB (ref 8.9–10.3)
CO2: 29 mmol/L (ref 22–32)
CO2: 24 mmol/L (ref 22–32)
CREATININE: 0.64 mg/dL (ref 0.44–1.00)
Calcium: 7.7 mg/dL — ABNORMAL LOW (ref 8.9–10.3)
Chloride: 100 mmol/L — ABNORMAL LOW (ref 101–111)
Chloride: 99 mmol/L — ABNORMAL LOW (ref 101–111)
Creatinine, Ser: 0.84 mg/dL (ref 0.44–1.00)
GFR calc Af Amer: >60 mL/min (ref >60)
GFR calc non Af Amer: >60 mL/min (ref >60)
Glucose, Bld: 88 mg/dL (ref 65–99)
Glucose, Bld: 107 mg/dL — ABNORMAL HIGH (ref 65–99)
Potassium: 3.1 mmol/L — ABNORMAL LOW (ref 3.5–5.1)
Potassium: 3.3 mmol/L — ABNORMAL LOW (ref 3.5–5.1)
SODIUM: 137 mmol/L (ref 135–145)
Sodium: 133 mmol/L — ABNORMAL LOW (ref 135–145)
Total Bilirubin: 1 mg/dL (ref 0.3–1.2)
Total Protein: 4.3 g/dL — ABNORMAL LOW (ref 6.5–8.1)
Total Protein: 4.7 g/dL — ABNORMAL LOW (ref 6.5–8.1)

## 2015-04-24 LAB — BLOOD GAS, ARTERIAL
Acid-base deficit: 4 mmol/L — ABNORMAL HIGH (ref 0.0–2.0)
Bicarbonate: 19.5 mEq/L — ABNORMAL LOW (ref 20.0–24.0)
Delivery systems: POSITIVE
Drawn by: 235321
Expiratory PAP: 5
FIO2: 1
Inspiratory PAP: 12
Mode: POSITIVE
O2 Saturation: 99.2 %
Patient temperature: 98.6
TCO2: 17.8 mmol/L (ref 0–100)
pCO2 arterial: 31.9 mmHg — ABNORMAL LOW (ref 35.0–45.0)
pH, Arterial: 7.402 (ref 7.350–7.450)
pO2, Arterial: 305 mmHg — ABNORMAL HIGH (ref 80.0–100.0)

## 2015-04-24 LAB — URINALYSIS, ROUTINE W REFLEX MICROSCOPIC
GLUCOSE, UA: NEGATIVE mg/dL
Nitrite: NEGATIVE
PH: 7.5 (ref 5.0–8.0)
PROTEIN: 100 mg/dL — AB
Specific Gravity, Urine: 1.02 (ref 1.005–1.030)
Urobilinogen, UA: 2 mg/dL — ABNORMAL HIGH (ref 0.0–1.0)

## 2015-04-24 LAB — URINE MICROSCOPIC-ADD ON

## 2015-04-24 LAB — TROPONIN I: Troponin I: 0.05 ng/mL — ABNORMAL HIGH (ref <0.031)

## 2015-04-24 LAB — LACTIC ACID, PLASMA
Lactic Acid, Venous: 3.6 mmol/L (ref 0.5–2.0)
Lactic Acid, Venous: 1.7 mmol/L (ref 0.5–2.0)

## 2015-04-24 LAB — CBG MONITORING, ED: Glucose-Capillary: 96 mg/dL (ref 65–99)

## 2015-04-24 MED ORDER — DIAZEPAM 5 MG/ML IJ SOLN
2.5000 mg | Freq: Once | INTRAMUSCULAR | Status: AC
  Administered 2015-04-24: 2.5 mg via INTRAVENOUS
  Filled 2015-04-24: qty 2

## 2015-04-24 MED ORDER — SODIUM CHLORIDE 0.9 % IV BOLUS (SEPSIS)
500.0000 mL | Freq: Once | INTRAVENOUS | Status: AC
  Administered 2015-04-24: 500 mL via INTRAVENOUS

## 2015-04-24 MED ORDER — VANCOMYCIN HCL IN DEXTROSE 750-5 MG/150ML-% IV SOLN
750.0000 mg | Freq: Once | INTRAVENOUS | Status: AC
  Administered 2015-04-24: 750 mg via INTRAVENOUS
  Filled 2015-04-24: qty 150

## 2015-04-24 MED ORDER — IPRATROPIUM-ALBUTEROL 0.5-2.5 (3) MG/3ML IN SOLN
3.0000 mL | Freq: Once | RESPIRATORY_TRACT | Status: AC
  Administered 2015-04-24: 3 mL via RESPIRATORY_TRACT
  Filled 2015-04-24: qty 3

## 2015-04-24 MED ORDER — DEXTROSE 5 % IV SOLN
2.0000 g | Freq: Once | INTRAVENOUS | Status: AC
  Administered 2015-04-24: 2 g via INTRAVENOUS
  Filled 2015-04-24: qty 2

## 2015-04-24 MED ORDER — MORPHINE SULFATE (PF) 4 MG/ML IV SOLN
4.0000 mg | Freq: Once | INTRAVENOUS | Status: AC
  Administered 2015-04-24: 4 mg via INTRAVENOUS
  Filled 2015-04-24: qty 1

## 2015-04-24 MED ORDER — SODIUM CHLORIDE 0.9 % IV SOLN
INTRAVENOUS | Status: DC
  Administered 2015-04-24: 22:00:00 via INTRAVENOUS

## 2015-04-24 MED ORDER — SODIUM CHLORIDE 0.9 % IV BOLUS (SEPSIS)
1000.0000 mL | Freq: Once | INTRAVENOUS | Status: AC
  Administered 2015-04-24: 1000 mL via INTRAVENOUS

## 2015-04-24 MED ORDER — IOHEXOL 350 MG/ML SOLN
80.0000 mL | Freq: Once | INTRAVENOUS | Status: AC | PRN
  Administered 2015-04-24: 80 mL via INTRAVENOUS

## 2015-04-24 MED ORDER — VANCOMYCIN HCL 500 MG IV SOLR
500.0000 mg | Freq: Two times a day (BID) | INTRAVENOUS | Status: DC
  Filled 2015-04-24: qty 500

## 2015-04-24 NOTE — H&P (Signed)
[facility] History and Physical  [name] HUT:[id number] DOB: 1965/12/02 DOA: 05/05/2015  Referring physician: Virgel Manifold, MD PCP: No primary care provider on file.   Chief Complaint: Altered mental status.  HPI: [name] is a 49 y.o. female with a past medical history of metastatic lung cancer (not on chemotherapy), IVC thrombosis who was brought to the emergency department via EMS due to change in mental status. Per her brother [name], she is usually able to talk with relatives, get out of bed and sit up, but today she was upstairs in the house and tested him requesting his help. He went upstairs and found her struggling to breathe. He called EMS, while his wife noticed that she had a faint pulse, but was not breathing. She gave her some rescue ventilations and when EMS arrived she had to be Ambu bagged. She also received a DuoNeb treatment and on route to the hospital.  In the ER, the patient was started on BiPAP ventilation, administer diazepam and morphine. She is currently in no acute distress, responds to touch and opens eyes, but does not answer questions.   Review of Systems:  Unable to review due to the patient's mental status.  Past Medical History  Diagnosis Date  . Cancer ([place])     left bronchioles  . Radiation 11/24/14-12/05/14    Left ribs 30 Gy in 10 fractions   Past Surgical History  Procedure Laterality Date  . Esophagogastroduodenoscopy N/A 09/14/2014    Procedure: ESOPHAGOGASTRODUODENOSCOPY (EGD);  Surgeon: Teena Irani, MD;  Location: [facility];  Service: Endoscopy;  Laterality: N/A;  . Total hip arthroplasty Left 02/10/2015    Procedure:   LEFT HIP BIPOLAR HEMI ARTHROPLASTY;  Surgeon: Mcarthur Rossetti, MD;  Location: [facility];  Service: Orthopedics;  Laterality: Left;  . Hip closed reduction Left 03/03/2015    Procedure: CLOSED MANIPULATION HIP;  Surgeon: Mcarthur Rossetti, MD;  Location: [facility];  Service: Orthopedics;   Laterality: Left;  closed reduction left hip with intraprocedural incision repair and dressing  . Hip closed reduction Left 03/26/2015    Procedure: CLOSED REDUCTION HIP;  Surgeon: Marybelle Killings, MD;  Location: [facility];  Service: Orthopedics;  Laterality: Left;  . Total hip arthroplasty Left 03/31/2015    Procedure: LEFT HIP REVISION OF HEMIARTHROPLASTY TO TOTAL HIP ARTHROPLASTY ANTERIOR APPROACH;  Surgeon: Mcarthur Rossetti, MD;  Location: [facility];  Service: Orthopedics;  Laterality: Left;   Social History:  reports that she quit smoking about 7 months ago. She does not have any smokeless tobacco history on file. She reports that she does not drink alcohol or use illicit drugs.  Allergies  Allergen Reactions  . Penicillins Rash    Has patient had a PCN reaction causing immediate rash, facial/tongue/throat swelling, SOB or lightheadedness with hypotension: no Has patient had a PCN reaction causing severe rash involving mucus membranes or skin necrosis: unknown Has patient had a PCN reaction that required hospitalization unknown Has patient had a PCN reaction occurring within the last 10 years: no If all of the above answers are "NO", then may proceed with Cephalosporin use.     Family History  Problem Relation Age of Onset  . Cancer Mother   . Heart failure Father     Prior to Admission medications   Medication Sig Start Date End Date Taking? Authorizing Provider  acetaminophen (TYLENOL) 325 MG tablet Take 2 tablets (650 mg total) by mouth every 6 (six) hours as needed for mild  pain (or Fever >/= 101). 09/30/14  Yes Bonnielee Haff, MD  ciprofloxacin (CIPRO) 500 MG tablet Take 500 mg by mouth 2 (two) times daily. For 10 days 11-4 to 11-14 05/11/2015  Yes Historical Provider, MD  diclofenac (VOLTAREN) 50 MG EC tablet Take 1 tablet by mouth 2 (two) times daily. 03/25/15  Yes Historical Provider, MD  DULoxetine (CYMBALTA) 30 MG capsule Take 30 mg by mouth daily.  12/05/14  Yes Historical Provider,  MD  enoxaparin (LOVENOX) 80 MG/0.8ML injection ADMINISTER 0.8 ML UNDER THE SKIN DAILY 01/26/15  Yes Maryanna Shape, NP  fentaNYL (DURAGESIC - DOSED MCG/HR) 100 MCG/HR Place 1 patch (100 mcg total) onto the skin every 3 (three) days. 03/05/15  Yes Mcarthur Rossetti, MD  ferrous sulfate 325 (65 FE) MG EC tablet Take 1 tablet (325 mg total) by mouth 2 (two) times daily with a meal. 01/26/15  Yes Maryanna Shape, NP  ipratropium (ATROVENT) 0.02 % nebulizer solution Take 2.5 mLs (0.5 mg total) by nebulization 3 (three) times daily. 11/18/14  Yes Maryanna Shape, NP  LORazepam (ATIVAN) 1 MG tablet Take 1 tablet (1 mg total) by mouth every 6 (six) hours as needed for anxiety. 02/24/15  Yes Estill Dooms, MD  morphine (MS CONTIN) 60 MG 12 hr tablet Take 60 mg by mouth every 8 (eight) hours.   Yes Historical Provider, MD  morphine (MSIR) 15 MG tablet Take 30 mg by mouth every 4 (four) hours as needed for severe pain.  04/10/15  Yes Historical Provider, MD  omeprazole (PRILOSEC) 20 MG capsule Take 1 capsule (20 mg total) by mouth 2 (two) times daily before a meal. 09/30/14  Yes Bonnielee Haff, MD  ondansetron (ZOFRAN) 8 MG tablet TAKE 1 TABLET(8 MG) BY MOUTH EVERY 8 HOURS AS NEEDED FOR NAUSEA OR VOMITING 11/26/14  Yes Maryanna Shape, NP  polyethylene glycol (MIRALAX / GLYCOLAX) packet Take 17 g by mouth daily as needed for moderate constipation.    Yes Historical Provider, MD  senna-docusate (SENOKOT-S) 8.6-50 MG per tablet Take 1 tablet by mouth at bedtime. Patient taking differently: Take 1 tablet by mouth at bedtime as needed for mild constipation.  09/30/14  Yes Bonnielee Haff, MD   Physical Exam: Filed Vitals:   05/04/2015 2002 05/08/2015 2100 04/25/2015 2200 05/17/2015 2230  BP:  '87/59 83/49 79/50 '$  Pulse:  103 100 91  Temp:      TempSrc:      Resp:      Weight: 46.267 kg (102 lb)     SpO2:  100% 100% 100%    Wt Readings from Last 3 Encounters:  04/29/2015 46.267 kg (102 lb)  03/26/15 46.267 kg (102 lb)   03/20/15 46.267 kg (102 lb)    General:  Positive cachexia and obtundation. Eyes: PERRL, normal lids, irises & conjunctiva ENT: BiPAP mask in place. Neck: no LAD, masses or thyromegaly Cardiovascular: RRR, no m/r/g. No LE edema. Telemetry: SR, no arrhythmias  Respiratory: No breath sounds on the left, positive rhonchi on the right. Abdomen: soft, ntnd Skin: no rash or induration seen on limited exam Musculoskeletal: grossly normal tone BUE/BLE Psychiatric: Obtunded. Neurologic: Responds to touch and opens eyes briefly, but does not respond questions.           Labs on Admission:  Basic Metabolic Panel:  Recent Labs Lab 05/06/2015 1200 04/29/2015 1954  NA 137 133*  K 3.1* 3.3*  CL 100* 99*  CO2 29 24  GLUCOSE 88 107*  BUN  19 20  CREATININE 0.64 0.84  CALCIUM 7.7* 7.7*   Liver Function Tests:  Recent Labs Lab 05/17/2015 1200 05/10/2015 1954  AST 18 18  ALT 8* 9*  ALKPHOS 257* 269*  BILITOT 1.2 1.0  PROT 4.3* 4.7*  ALBUMIN 1.6* 1.8*   No results for input(s): LIPASE, AMYLASE in the last 168 hours. No results for input(s): AMMONIA in the last 168 hours. CBC:  Recent Labs Lab 05/15/2015 1200 05/02/2015 1954  WBC 18.7* 26.8*  NEUTROABS 17.5* 25.8*  HGB 10.6* 12.2  HCT 31.8* 36.7  MCV 83.2 82.8  PLT 483* 529*   Cardiac Enzymes:  Recent Labs Lab 05/17/2015 1954  TROPONINI 0.05*    BNP (last 3 results)  Recent Labs  09/22/14 0405  BNP 301.5*    ProBNP (last 3 results) No results for input(s): PROBNP in the last 8760 hours.  CBG:  Recent Labs Lab 04/25/2015 1921  GLUCAP 96    Radiological Exams on Admission: Ct Head Wo Contrast  05/06/2015  CLINICAL DATA:  Unresponsive and hypoventilatory. EXAM: CT HEAD WITHOUT CONTRAST TECHNIQUE: Contiguous axial images were obtained from the base of the skull through the vertex without intravenous contrast. COMPARISON:  03/02/2015 FINDINGS: There is no intracranial hemorrhage, mass or evidence of acute infarction.  There is no extra-axial fluid collection. Gray matter and white matter appear normal. Cerebral volume is normal for age. Brainstem and posterior fossa are unremarkable. The CSF spaces appear normal. The bony structures are intact. The visible portions of the paranasal sinuses are clear. IMPRESSION: Normal brain Electronically Signed   By: Andreas Newport M.D.   On: 05/09/2015 21:09   Ct Angio Chest Pe W/cm &/or Wo Cm  05/20/2015  CLINICAL DATA:  49 year old female with history of lung cancer found unresponsive. EXAM: CT ANGIOGRAPHY CHEST WITH CONTRAST TECHNIQUE: Multidetector CT imaging of the chest was performed using the standard protocol during bolus administration of intravenous contrast. Multiplanar CT image reconstructions and MIPs were obtained to evaluate the vascular anatomy. CONTRAST:  30m OMNIPAQUE IOHEXOL 350 MG/ML SOLN COMPARISON:  CT of the chest, abdomen and pelvis 01/07/2015. FINDINGS: Mediastinum/Lymph Nodes: There are no filling defects within the pulmonary arterial tree to suggest pulmonary embolism. Heart size is normal. Moderate to large pericardial effusion. Extensive left hilar and mediastinal lymphadenopathy causing marked narrowing of the left main pulmonary artery, complete occlusion of the left inferior pulmonary vein, and near complete occlusion of the left superior pulmonary vein. There is a filling defect in the left atrium (image 51 of series 5) adjacent to the left superior pulmonary vein, which may represent direct cardiac invasion, or may simply represent some mural associated thrombus. Bulky lymphoid tissue in the mediastinum measures up to 2.9 cm in short axis, and appears to completely occlude the left mainstem bronchus on today's examination. Patulous fluid-filled esophagus. No axillary lymphadenopathy. Lungs/Pleura: Complete atelectasis and consolidation of the left lung with CT angiogram sign throughout the left lung. In the medial aspect of the upper left hemithorax  there is a relatively well-defined 3.2 x 2.7 cm mass occupying lesion which splays vessels around it, which either represents a lesion in the left upper lobe, or a pleural-based lesion. Multiple other pleural-based lesions are again noted, including lesions invading the chest wall, as demonstrated by a 5.7 x 3.1 cm lesion on image 68 of series 5 which is grossly invading portions of the ninth and tenth ribs posteriorly. This lesion in particular appears more extensive than the prior study, and is again associated  with a pathologic fracture. Several other left pleural lesions with chest wall invasion are also noted (see below). Small left pleural effusion. New moderate right-sided pleural effusion with some associated passive atelectasis in the dependent right lower lobe. Patchy ground-glass attenuation and septal thickening in the right lung has an appearance most suggestive of infection/inflammation. 12 x 10 mm spiculated nodule in the inferior aspect of the right lower lobe (image 79 of series 12), increased from 6 x 11 mm on prior study 01/07/2015. New 2.0 x 1.3 cm ground-glass attenuation nodule in the medial aspect of the right middle lobe is favored to be infectious/inflammatory. Upper Abdomen: Low attenuation throughout the visualized hepatic parenchyma, compatible with severe hepatic steatosis. Musculoskeletal/Soft Tissues: As discussed above, there are multiple pleural-based lesions, many of which demonstrate direct chest wall invasion, with involvement of the posterior left sixth rib, anterior left seventh and eighth ribs, posterior left ninth through twelfth ribs. Review of the MIP images confirms the above findings. IMPRESSION: 1. No evidence of pulmonary embolism. 2. Progression of disease with increased size and number of numerous pleural based lesions throughout the left hemithorax with increasing chest wall invasion, and worsening left hilar and mediastinal adenopathy, including complete occlusion of  the left inferior pulmonary vein and near complete occlusion of the left superior pulmonary vein. There is also evidence of direct cardiac invasion into the left atrium (versus bland thrombus), as above. Enlarging moderate to large pericardial effusion suggests malignant pericardial involvement, moderate right pleural effusion and there is a new with enlarging nodule in the right lower lobe as well. 3. Complete atelectasis and consolidation in the left lung, related to complete obstruction of the left mainstem bronchus. 4. Additional incidental findings, as above. Electronically Signed   By: Vinnie Langton M.D.   On: 05/15/2015 21:24   Dg Chest Portable 1 View  05/06/2015  CLINICAL DATA:  49 year old with current history of left lung cancer, found unresponsive earlier this evening. EXAM: PORTABLE CHEST 1 VIEW COMPARISON:  03/31/2015 and earlier, including CT chest 01/07/2015. FINDINGS: Occlusion of the left mainstem bronchus with complete atelectasis of the left lung and mediastinal shift to the left, unchanged when compared to the 03/31/2015 examination. Since that time, the patient has developed patchy airspace opacities in the base of the remaining right lung. This is superimposed upon changes of COPD and emphysema. IMPRESSION: 1. Acute bronchopneumonia involving the base of the right lung. 2. Occlusion of the left mainstem bronchus with complete atelectasis of the left lung and mediastinal shift to the left, unchanged dating back to at least July, 2016 chest CT. Electronically Signed   By: Evangeline Dakin M.D.   On: 05/15/2015 19:46    EKG: Independently reviewed. Vent. rate 126 BPM PR interval 209 ms QRS duration 113 ms QT/QTc 308/446 ms P-R-T axes 79 21 -73 Sinus tachycardia Baseline wander.  Echocardiogram: 09/12/2014 ------------------------------------------------------------------- History:  PMH: Lung carcinoma,  metastasis.  ------------------------------------------------------------------- Study Conclusions  - Left ventricle: LVEF is grossly normal at approximately 60% Diffiuclt to fully evaluate regional wall motion as endocardium is difficult to see well. The cavity size was normal. Wall thickness was normal. - Pulmonary arteries: PA peak pressure: 34 mm Hg (S).   Assessment/Plan Principal Problem:   Acute respiratory failure (HCC) This is due to complete atelectasis and consolidation in the left lung, related to complete obstruction of the left mainstem bronchus.  Continue BiPAP ventilation and bronchodilators. Continue IV antibiotic therapy. At this point, her prognosis is poor and her family members  understand this.  Active Problems:    Altered mental status Due to acuity of condition and advanced metastatic lung cancer. Continue current treatment and monitor for any mental status changes.     Thrombosis of superior vena cava Continue daily Lovenox subcutaneously.    Squamous cell lung cancer (HCC) Not on chemotherapy at this time. Continue palliative use of opioid analgesics and benzodiazepines as needed.    DNR (do not resuscitate) discussion This was discussed again with family members and they confirmed her DO NOT RESUSCITATE status. Her brother and sister-in-law are her power of attorney.    Hypokalemia Continue replacement and monitor potassium level.    Hyponatremia Continue replacement and monitor sodium level.      Code Status: DO NOT RESUSCITATE/DO NOT INTUBATE DVT Prophylaxis: On therapeutic dose Lovenox 80 mg SQ every day. Family CommunicationJerald Conway [phone]  [phone]   [name]  sister-in-law  [phone]  [phone]   Disposition Plan: Admit to a stepdown to continue BiPAP ventilation and IV antibiotics.  Time spent: Over 90 minutes were spent during the process of this admission, including examining the  patient, interviewing her relatives, reviewing the chart, starting admission orders and coordinating care with the rest of the staff.  Reubin Milan [facility] Pager [phone].

## 2015-04-24 NOTE — Progress Notes (Signed)
ANTIBIOTIC CONSULT NOTE - INITIAL  Pharmacy Consult for Vancomycin Indication: pneumonia  Allergies  Allergen Reactions  . Penicillins Rash    Has patient had a PCN reaction causing immediate rash, facial/tongue/throat swelling, SOB or lightheadedness with hypotension: no Has patient had a PCN reaction causing severe rash involving mucus membranes or skin necrosis: unknown Has patient had a PCN reaction that required hospitalization unknown Has patient had a PCN reaction occurring within the last 10 years: no If all of the above answers are "NO", then may proceed with Cephalosporin use.     Patient Measurements: Weight: 102 lb (46.267 kg)   Vital Signs: Temp: 98.8 F (37.1 C) (11/04 1901) Temp Source: Rectal (11/04 1901) BP: 105/61 mmHg (11/04 1901) Pulse Rate: 96 (11/04 1844) Intake/Output from previous day:   Intake/Output from this shift:    Labs:  Recent Labs  05/01/2015 1200  WBC 18.7*  HGB 10.6*  PLT 483*  CREATININE 0.64   Estimated Creatinine Clearance: 63.5 mL/min (by C-G formula based on Cr of 0.64). No results for input(s): VANCOTROUGH, VANCOPEAK, VANCORANDOM, GENTTROUGH, GENTPEAK, GENTRANDOM, TOBRATROUGH, TOBRAPEAK, TOBRARND, AMIKACINPEAK, AMIKACINTROU, AMIKACIN in the last 72 hours.   Microbiology: Recent Results (from the past 720 hour(s))  Surgical pcr screen     Status: Abnormal   Collection Time: 03/31/15  6:07 AM  Result Value Ref Range Status   MRSA, PCR NEGATIVE NEGATIVE Final   Staphylococcus aureus POSITIVE (A) NEGATIVE Final    Comment:        The Xpert SA Assay (FDA approved for NASAL specimens in patients over 48 years of age), is one component of a comprehensive surveillance program.  Test performance has been validated by [facility] for patients greater than or equal to 48 year old. It is not intended to diagnose infection nor to guide or monitor treatment.     Medical History: Past Medical History  Diagnosis Date  .  Cancer ([place])     left bronchioles  . Radiation 11/24/14-12/05/14    Left ribs 30 Gy in 10 fractions     Assessment: 49 yoF with h/o lung cancer admitted last month for operative treatment of recurrent dislocation of hip join prosthesis presents via EMS after being found unresponsive with 4 breaths per minute, confused.  CXR shows acute bronchopneumonia involving base of right lung.  Aztreonam 2g x 1 and Vancomycin per pharmacy ordered.  - WBC high - SCr 0.64, CrCl~78 ml/min  Goal of Therapy:  Vancomycin trough level 15-20 mcg/ml  Doses adjusted per renal function Eradication of infection  Plan:  1.  Vancomycin 750 mg IV x 1 then 500 mg IV q12h. 2.  F/u orders for continued aztreonam.  Hershal Coria 05/07/2015,8:09 PM

## 2015-04-24 NOTE — ED Notes (Signed)
Lactic Acid 3.6, reported to Dr. Wilson Singer and [facility]

## 2015-04-24 NOTE — ED Provider Notes (Signed)
CSN: [id number]     Arrival date & time 05/12/2015  1840 History   First MD Initiated Contact with Patient 05/03/2015 1857     Chief Complaint  Patient presents with  . Altered Mental Status     (Consider location/radiation/quality/duration/timing/severity/associated sxs/prior Treatment) HPI  49 year old female brought in by EMS in respiratory distress. A text message to her family from upstairs to the home that she was not feeling well. Family went to check on her shortly later and found her in respiratory distress. Apparently patient had to be bagged for EMS and she was given a DuoNeb. Last seen normally sometime earlier in the day. Patient has a past history of advanced, metastatic lung cancer and is currently not getting any ongoing treatment for it. Her brother reports that at baseline she can carry on a conversation and her current condition is a markedly change. Currently she is in respiratory distress and seems very uncomfortable. She is not responding to my questions will make eye contact. No reported trauma. Family reports that she was seen by PCP earlier today and diagnosed with urinary tract infection. She received 1 dose of antibiotics.   Past Medical History  Diagnosis Date  . Cancer ([place])     left bronchioles  . Radiation 11/24/14-12/05/14    Left ribs 30 Gy in 10 fractions   Past Surgical History  Procedure Laterality Date  . Esophagogastroduodenoscopy N/A 09/14/2014    Procedure: ESOPHAGOGASTRODUODENOSCOPY (EGD);  Surgeon: Teena Irani, MD;  Location: [facility];  Service: Endoscopy;  Laterality: N/A;  . Total hip arthroplasty Left 02/10/2015    Procedure:   LEFT HIP BIPOLAR HEMI ARTHROPLASTY;  Surgeon: Mcarthur Rossetti, MD;  Location: [facility];  Service: Orthopedics;  Laterality: Left;  . Hip closed reduction Left 03/03/2015    Procedure: CLOSED MANIPULATION HIP;  Surgeon: Mcarthur Rossetti, MD;  Location: [place];  Service: Orthopedics;  Laterality: Left;  closed reduction  left hip with intraprocedural incision repair and dressing  . Hip closed reduction Left 03/26/2015    Procedure: CLOSED REDUCTION HIP;  Surgeon: Marybelle Killings, MD;  Location: [place];  Service: Orthopedics;  Laterality: Left;  . Total hip arthroplasty Left 03/31/2015    Procedure: LEFT HIP REVISION OF HEMIARTHROPLASTY TO TOTAL HIP ARTHROPLASTY ANTERIOR APPROACH;  Surgeon: Mcarthur Rossetti, MD;  Location: [place];  Service: Orthopedics;  Laterality: Left;   Family History  Problem Relation Age of Onset  . Cancer Mother   . Heart failure Father    Social History  Substance Use Topics  . Smoking status: Former Smoker    Quit date: 09/07/2014  . Smokeless tobacco: None  . Alcohol Use: No   OB History    No data available     Review of Systems  Level 5 caveat because pt is encephalopathic.   Allergies  Penicillins  Home Medications   Prior to Admission medications   Medication Sig Start Date End Date Taking? Authorizing Provider  acetaminophen (TYLENOL) 325 MG tablet Take 2 tablets (650 mg total) by mouth every 6 (six) hours as needed for mild pain (or Fever >/= 101). 09/30/14   Bonnielee Haff, MD  diclofenac (VOLTAREN) 50 MG EC tablet Take 1 tablet by mouth 2 (two) times daily. 03/25/15   Historical Provider, MD  DULoxetine (CYMBALTA) 30 MG capsule Take 30 mg by mouth daily.  12/05/14   Historical Provider, MD  enoxaparin (LOVENOX) 80 MG/0.8ML injection ADMINISTER 0.8 ML UNDER THE SKIN DAILY 01/26/15   Erasmo Downer  R Curcio, NP  fentaNYL (DURAGESIC - DOSED MCG/HR) 100 MCG/HR Place 1 patch (100 mcg total) onto the skin every 3 (three) days. 03/05/15   Mcarthur Rossetti, MD  ferrous sulfate 325 (65 FE) MG EC tablet Take 1 tablet (325 mg total) by mouth 2 (two) times daily with a meal. 01/26/15   Maryanna Shape, NP  HYDROmorphone (DILAUDID) 2 MG tablet Take 2 mg by mouth every 4 (four) hours as needed for severe pain. 1-2 tabs PO Q 3-4 hours PRN    Historical Provider, MD  ipratropium  (ATROVENT) 0.02 % nebulizer solution Take 2.5 mLs (0.5 mg total) by nebulization 3 (three) times daily. 11/18/14   Maryanna Shape, NP  lidocaine (LIDODERM) 5 % 1 patch. To left ant chest wall, 12 hours on/12 hours off, Remove & Discard patch within 12 hours or as directed by MD    Historical Provider, MD  LORazepam (ATIVAN) 1 MG tablet Take 1 tablet (1 mg total) by mouth every 6 (six) hours as needed for anxiety. 02/24/15   Estill Dooms, MD  morphine (MS CONTIN) 30 MG 12 hr tablet Take 2 tablets by mouth every 8 (eight) hours. 0600, 1400, 2200 01/26/15   Historical Provider, MD  morphine (MS CONTIN) 60 MG 12 hr tablet Take 60 mg by mouth every 8 (eight) hours.    Historical Provider, MD  omeprazole (PRILOSEC) 20 MG capsule Take 1 capsule (20 mg total) by mouth 2 (two) times daily before a meal. 09/30/14   Bonnielee Haff, MD  ondansetron (ZOFRAN) 8 MG tablet TAKE 1 TABLET(8 MG) BY MOUTH EVERY 8 HOURS AS NEEDED FOR NAUSEA OR VOMITING 11/26/14   Maryanna Shape, NP  polyethylene glycol (MIRALAX / GLYCOLAX) packet Take 17 g by mouth daily as needed for moderate constipation.     Historical Provider, MD  senna-docusate (SENOKOT-S) 8.6-50 MG per tablet Take 1 tablet by mouth at bedtime. Patient taking differently: Take 1 tablet by mouth at bedtime as needed for mild constipation.  09/30/14   Bonnielee Haff, MD   BP 87/59 mmHg  Pulse 103  Temp(Src) 98.8 F (37.1 C) (Rectal)  Resp 24  Wt 102 lb (46.267 kg)  SpO2 100% Physical Exam  Constitutional: She appears distressed.  Frail/chronically ill appearing. rocking around in bed.   HENT:  Head: Normocephalic and atraumatic.  Eyes: Conjunctivae are normal. Right eye exhibits no discharge. Left eye exhibits no discharge.  Neck: Neck supple.  Cardiovascular: Regular rhythm and normal heart sounds.  Exam reveals no gallop and no friction rub.   No murmur heard. Pulmonary/Chest: Breath sounds normal. She is in respiratory distress.  R sided rhonchi.  diminished L.   Abdominal: Soft. She exhibits no distension. There is no tenderness.  Musculoskeletal:  L hip surgical site appearing to be healing w/o complication  Neurological:  Opens eye when spoken to. Makes eye contact. Not following commands. Moving all extremities.   Skin: Skin is warm and dry.  Nursing note and vitals reviewed.   ED Course  Procedures (including critical care time) Labs Review Labs Reviewed  BLOOD GAS, ARTERIAL - Abnormal; Notable for the following:    pCO2 arterial 31.9 (*)    pO2, Arterial 305 (*)    Bicarbonate 19.5 (*)    Acid-base deficit 4.0 (*)    All other components within normal limits  CBC WITH DIFFERENTIAL/PLATELET - Abnormal; Notable for the following:    WBC 26.8 (*)    RDW 18.6 (*)  Platelets 529 (*)    Neutro Abs 25.8 (*)    Lymphs Abs 0.5 (*)    All other components within normal limits  COMPREHENSIVE METABOLIC PANEL - Abnormal; Notable for the following:    Sodium 133 (*)    Potassium 3.3 (*)    Chloride 99 (*)    Glucose, Bld 107 (*)    Calcium 7.7 (*)    Total Protein 4.7 (*)    Albumin 1.8 (*)    ALT 9 (*)    Alkaline Phosphatase 269 (*)    All other components within normal limits  TROPONIN I - Abnormal; Notable for the following:    Troponin I 0.05 (*)    All other components within normal limits  LACTIC ACID, PLASMA - Abnormal; Notable for the following:    Lactic Acid, Venous 3.6 (*)    All other components within normal limits  URINE CULTURE  URINALYSIS, ROUTINE W REFLEX MICROSCOPIC (NOT AT [facility])  LACTIC ACID, PLASMA  CBG MONITORING, ED    Imaging Review Ct Head Wo Contrast  05/04/2015  CLINICAL DATA:  Unresponsive and hypoventilatory. EXAM: CT HEAD WITHOUT CONTRAST TECHNIQUE: Contiguous axial images were obtained from the base of the skull through the vertex without intravenous contrast. COMPARISON:  03/02/2015 FINDINGS: There is no intracranial hemorrhage, mass or evidence of acute infarction. There is no  extra-axial fluid collection. Gray matter and white matter appear normal. Cerebral volume is normal for age. Brainstem and posterior fossa are unremarkable. The CSF spaces appear normal. The bony structures are intact. The visible portions of the paranasal sinuses are clear. IMPRESSION: Normal brain Electronically Signed   By: Andreas Newport M.D.   On: 05/04/2015 21:09   Dg Chest Portable 1 View  05/14/2015  CLINICAL DATA:  49 year old with current history of left lung cancer, found unresponsive earlier this evening. EXAM: PORTABLE CHEST 1 VIEW COMPARISON:  03/31/2015 and earlier, including CT chest 01/07/2015. FINDINGS: Occlusion of the left mainstem bronchus with complete atelectasis of the left lung and mediastinal shift to the left, unchanged when compared to the 03/31/2015 examination. Since that time, the patient has developed patchy airspace opacities in the base of the remaining right lung. This is superimposed upon changes of COPD and emphysema. IMPRESSION: 1. Acute bronchopneumonia involving the base of the right lung. 2. Occlusion of the left mainstem bronchus with complete atelectasis of the left lung and mediastinal shift to the left, unchanged dating back to at least July, 2016 chest CT. Electronically Signed   By: Evangeline Dakin M.D.   On: 05/07/2015 19:46   I have personally reviewed and evaluated these images and lab results as part of my medical decision-making.   EKG Interpretation None      MDM   Final diagnoses:  Septic shock ([place])  Malignant pericardial effusion (HCC)  HCAP (healthcare-associated pneumonia)  Metastatic cancer ([place])    48yF altered mental status. No aeration of L lung. R sided pneumonia. Good sized pericardial effusion. Is hypotensive. Tamponade versus septic shock. Pt DNR/DNI. Discussed possible cardiac invasion by tumor and likely pericardial involvement with family. Understand that if left untreated that this will likely worsen and be terminal event.  Simple pericardiocentesis likely only temporizing measure as fluid will almost certainly reaccumulate. They do not wish to pursue any more aggressive/invasive procedures and I'm not sure she would even be a candidate. Will continue abx and IVF. No escalation of care beyond current therapies per pt/family wishes. Admission. Palliative care consultation.  Virgel Manifold, MD 05/10/2015 2202

## 2015-04-24 NOTE — ED Notes (Signed)
Patient with hx of lung cancer found unresponsive with 4 breaths per minute. Per EMS pt normal sits up and converses normally, pt texted family to request help, pt currently writhing in bed, confused, mute, shows no signs of cognition. Able to track. Last known well 1 hour ago. 1 duoneb administered in field.

## 2015-04-25 DIAGNOSIS — J9601 Acute respiratory failure with hypoxia: Secondary | ICD-10-CM

## 2015-04-25 DIAGNOSIS — C3490 Malignant neoplasm of unspecified part of unspecified bronchus or lung: Secondary | ICD-10-CM

## 2015-04-25 DIAGNOSIS — E871 Hypo-osmolality and hyponatremia: Secondary | ICD-10-CM

## 2015-04-25 DIAGNOSIS — Z7189 Other specified counseling: Secondary | ICD-10-CM

## 2015-04-25 LAB — URINALYSIS, ROUTINE W REFLEX MICROSCOPIC
Glucose, UA: NEGATIVE mg/dL
Ketones, ur: NEGATIVE mg/dL
Nitrite: NEGATIVE
Protein, ur: 100 mg/dL — AB
Specific Gravity, Urine: >1.046 — ABNORMAL HIGH (ref 1.005–1.030)
Urobilinogen, UA: 1 mg/dL (ref 0.0–1.0)
pH: 7.5 (ref 5.0–8.0)

## 2015-04-25 LAB — URINE MICROSCOPIC-ADD ON

## 2015-04-25 LAB — MRSA PCR SCREENING: MRSA BY PCR: NEGATIVE

## 2015-04-25 MED ORDER — CETYLPYRIDINIUM CHLORIDE 0.05 % MT LIQD
7.0000 mL | Freq: Two times a day (BID) | OROMUCOSAL | Status: DC
  Administered 2015-04-25: 7 mL via OROMUCOSAL

## 2015-04-25 MED ORDER — SODIUM CHLORIDE 0.9 % IV SOLN
1.0000 mg/h | INTRAVENOUS | Status: DC
  Administered 2015-04-25: 1 mg/h via INTRAVENOUS
  Filled 2015-04-25 (×2): qty 10

## 2015-04-25 MED ORDER — POTASSIUM CHLORIDE IN NACL 40-0.9 MEQ/L-% IV SOLN
INTRAVENOUS | Status: DC
  Administered 2015-04-25: 75 mL/h via INTRAVENOUS
  Filled 2015-04-25 (×3): qty 1000

## 2015-04-25 MED ORDER — DEXTROSE 5 % IV SOLN
1.0000 g | Freq: Three times a day (TID) | INTRAVENOUS | Status: DC
  Administered 2015-04-25: 1 g via INTRAVENOUS
  Filled 2015-04-25 (×2): qty 1

## 2015-04-25 MED ORDER — FENTANYL 100 MCG/HR TD PT72
100.0000 ug | MEDICATED_PATCH | TRANSDERMAL | Status: DC
  Administered 2015-04-25: 100 ug via TRANSDERMAL
  Filled 2015-04-25: qty 1

## 2015-04-25 MED ORDER — CHLORHEXIDINE GLUCONATE 0.12 % MT SOLN
15.0000 mL | Freq: Two times a day (BID) | OROMUCOSAL | Status: DC
  Administered 2015-04-25 (×3): 15 mL via OROMUCOSAL
  Filled 2015-04-25: qty 15

## 2015-04-25 MED ORDER — IPRATROPIUM-ALBUTEROL 0.5-2.5 (3) MG/3ML IN SOLN
3.0000 mL | Freq: Four times a day (QID) | RESPIRATORY_TRACT | Status: DC
  Administered 2015-04-25 – 2015-04-26 (×5): 3 mL via RESPIRATORY_TRACT
  Filled 2015-04-25 (×6): qty 3

## 2015-04-25 MED ORDER — DIAZEPAM 5 MG/ML IJ SOLN: 2.5000 mg | INTRAMUSCULAR | Status: DC | PRN

## 2015-04-25 MED ORDER — ENOXAPARIN SODIUM 80 MG/0.8ML ~~LOC~~ SOLN
80.0000 mg | SUBCUTANEOUS | Status: DC
  Administered 2015-04-25: 80 mg via SUBCUTANEOUS
  Filled 2015-04-25 (×3): qty 0.8

## 2015-04-25 MED ORDER — MORPHINE SULFATE (PF) 2 MG/ML IV SOLN
2.0000 mg | INTRAVENOUS | Status: DC | PRN
  Administered 2015-04-25: 2 mg via INTRAVENOUS
  Administered 2015-04-25: 1 mg via INTRAVENOUS
  Administered 2015-04-25: 2 mg via INTRAVENOUS
  Filled 2015-04-25 (×2): qty 1

## 2015-04-25 MED ORDER — LORAZEPAM 2 MG/ML IJ SOLN
1.0000 mg | INTRAMUSCULAR | Status: DC | PRN
  Administered 2015-04-25: 1 mg via INTRAVENOUS
  Filled 2015-04-25: qty 1

## 2015-04-25 NOTE — Progress Notes (Signed)
Transported to room 1227 without incident.

## 2015-04-25 NOTE — Progress Notes (Signed)
[facility] PROGRESS NOTE  [name] CNO:[id number] DOB: [birth year] DOA: 05/08/2015 PCP: No primary care provider on file.  Assessment/Plan: 1. ACute hypoxic resp failure 2. Progressive Lung CA-metastatic  3. Severe Malnutrition 4. Adult failure to thrive 5. COPD -Hospice recommended by Dr.Mohamed in September -She is dying, called and d/w brother -Stop Abx, Tx out of ICU, Morphine gtt [facility] demise  Code Status: DNR Family Communication: called and dw Brother Disposition Plan: Transfer to Palliative u    HPI/Subjective: Complains of pain an dyspnea  Objective: Filed Vitals:   04/25/15 1000  BP: 57/35  Pulse:   Temp:   Resp: 21    Intake/Output Summary (Last 24 hours) at 04/25/15 1115 Last data filed at 04/25/15 1015  Gross per 24 hour  Intake 892.88 ml  Output    250 ml  Net 642.88 ml   Filed Weights   05/16/2015 2002 04/25/15 0025  Weight: 46.267 kg (102 lb) 45.1 kg (99 lb 6.8 oz)    Exam:   General:  Awake, cachectic, in mod distress  Cardiovascular: S1S2/RRR  Respiratory: Poor air movement on L  Abdomen: Soft, NT, BS present  Musculoskeletal: no edema c/c   Data Reviewed: Basic Metabolic Panel:  Recent Labs Lab 05/15/2015 1200 05/05/2015 1954  NA 137 133*  K 3.1* 3.3*  CL 100* 99*  CO2 29 24  GLUCOSE 88 107*  BUN 19 20  CREATININE 0.64 0.84  CALCIUM 7.7* 7.7*   Liver Function Tests:  Recent Labs Lab 05/12/2015 1200 05/15/2015 1954  AST 18 18  ALT 8* 9*  ALKPHOS 257* 269*  BILITOT 1.2 1.0  PROT 4.3* 4.7*  ALBUMIN 1.6* 1.8*   No results for input(s): LIPASE, AMYLASE in the last 168 hours. No results for input(s): AMMONIA in the last 168 hours. CBC:  Recent Labs Lab 05/16/2015 1200 05/02/2015 1954  WBC 18.7* 26.8*  NEUTROABS 17.5* 25.8*  HGB 10.6* 12.2  HCT 31.8* 36.7  MCV 83.2 82.8  PLT 483* 529*   Cardiac Enzymes:  Recent Labs Lab 05/01/2015 1954  TROPONINI 0.05*   BNP (last 3 results)  Recent  Labs  09/22/14 0405  BNP 301.5*    ProBNP (last 3 results) No results for input(s): PROBNP in the last 8760 hours.  CBG:  Recent Labs Lab 05/18/2015 1921  GLUCAP 96    Recent Results (from the past 240 hour(s))  Culture, blood (routine x 2) Call MD if unable to obtain prior to antibiotics being given     Status: None (Preliminary result)   Collection Time: 04/25/15 12:40 AM  Result Value Ref Range Status   Specimen Description   Final    BLOOD LEFT HAND Performed at [facility] [place]  Final   Culture PENDING  Incomplete   Report Status PENDING  Incomplete  Culture, blood (routine x 2) Call MD if unable to obtain prior to antibiotics being given     Status: None (Preliminary result)   Collection Time: 04/25/15 12:45 AM  Result Value Ref Range Status   Specimen Description   Final    BLOOD RIGHT ARM Performed at [facility]    Special Requests IN PEDIATRIC BOTTLE [facility]  Final   Culture PENDING  Incomplete   Report Status PENDING  Incomplete  MRSA PCR Screening     Status: None   Collection Time: 04/25/15  1:07 AM  Result Value Ref Range Status   MRSA by PCR  NEGATIVE NEGATIVE Final    Comment:        The GeneXpert MRSA Assay (FDA approved for NASAL specimens only), is one component of a comprehensive MRSA colonization surveillance program. It is not intended to diagnose MRSA infection nor to guide or monitor treatment for MRSA infections.      Studies: Ct Head Wo Contrast  05/10/2015  CLINICAL DATA:  Unresponsive and hypoventilatory. EXAM: CT HEAD WITHOUT CONTRAST TECHNIQUE: Contiguous axial images were obtained from the base of the skull through the vertex without intravenous contrast. COMPARISON:  03/02/2015 FINDINGS: There is no intracranial hemorrhage, mass or evidence of acute infarction. There is no extra-axial fluid collection. Gray matter and white matter appear normal. Cerebral volume is normal for age.  Brainstem and posterior fossa are unremarkable. The CSF spaces appear normal. The bony structures are intact. The visible portions of the paranasal sinuses are clear. IMPRESSION: Normal brain Electronically Signed   By: Andreas Newport M.D.   On: 04/23/2015 21:09   Ct Angio Chest Pe W/cm &/or Wo Cm  05/11/2015  CLINICAL DATA:  49 year old female with history of lung cancer found unresponsive. EXAM: CT ANGIOGRAPHY CHEST WITH CONTRAST TECHNIQUE: Multidetector CT imaging of the chest was performed using the standard protocol during bolus administration of intravenous contrast. Multiplanar CT image reconstructions and MIPs were obtained to evaluate the vascular anatomy. CONTRAST:  44m OMNIPAQUE IOHEXOL 350 MG/ML SOLN COMPARISON:  CT of the chest, abdomen and pelvis 01/07/2015. FINDINGS: Mediastinum/Lymph Nodes: There are no filling defects within the pulmonary arterial tree to suggest pulmonary embolism. Heart size is normal. Moderate to large pericardial effusion. Extensive left hilar and mediastinal lymphadenopathy causing marked narrowing of the left main pulmonary artery, complete occlusion of the left inferior pulmonary vein, and near complete occlusion of the left superior pulmonary vein. There is a filling defect in the left atrium (image 51 of series 5) adjacent to the left superior pulmonary vein, which may represent direct cardiac invasion, or may simply represent some mural associated thrombus. Bulky lymphoid tissue in the mediastinum measures up to 2.9 cm in short axis, and appears to completely occlude the left mainstem bronchus on today's examination. Patulous fluid-filled esophagus. No axillary lymphadenopathy. Lungs/Pleura: Complete atelectasis and consolidation of the left lung with CT angiogram sign throughout the left lung. In the medial aspect of the upper left hemithorax there is a relatively well-defined 3.2 x 2.7 cm mass occupying lesion which splays vessels around it, which either  represents a lesion in the left upper lobe, or a pleural-based lesion. Multiple other pleural-based lesions are again noted, including lesions invading the chest wall, as demonstrated by a 5.7 x 3.1 cm lesion on image 68 of series 5 which is grossly invading portions of the ninth and tenth ribs posteriorly. This lesion in particular appears more extensive than the prior study, and is again associated with a pathologic fracture. Several other left pleural lesions with chest wall invasion are also noted (see below). Small left pleural effusion. New moderate right-sided pleural effusion with some associated passive atelectasis in the dependent right lower lobe. Patchy ground-glass attenuation and septal thickening in the right lung has an appearance most suggestive of infection/inflammation. 12 x 10 mm spiculated nodule in the inferior aspect of the right lower lobe (image 79 of series 12), increased from 6 x 11 mm on prior study 01/07/2015. New 2.0 x 1.3 cm ground-glass attenuation nodule in the medial aspect of the right middle lobe is favored to be infectious/inflammatory. Upper Abdomen: Low  attenuation throughout the visualized hepatic parenchyma, compatible with severe hepatic steatosis. Musculoskeletal/Soft Tissues: As discussed above, there are multiple pleural-based lesions, many of which demonstrate direct chest wall invasion, with involvement of the posterior left sixth rib, anterior left seventh and eighth ribs, posterior left ninth through twelfth ribs. Review of the MIP images confirms the above findings. IMPRESSION: 1. No evidence of pulmonary embolism. 2. Progression of disease with increased size and number of numerous pleural based lesions throughout the left hemithorax with increasing chest wall invasion, and worsening left hilar and mediastinal adenopathy, including complete occlusion of the left inferior pulmonary vein and near complete occlusion of the left superior pulmonary vein. There is also  evidence of direct cardiac invasion into the left atrium (versus bland thrombus), as above. Enlarging moderate to large pericardial effusion suggests malignant pericardial involvement, moderate right pleural effusion and there is a new with enlarging nodule in the right lower lobe as well. 3. Complete atelectasis and consolidation in the left lung, related to complete obstruction of the left mainstem bronchus. 4. Additional incidental findings, as above. Electronically Signed   By: Vinnie Langton M.D.   On: 05/11/2015 21:24   Dg Chest Portable 1 View  05/20/2015  CLINICAL DATA:  49 year old with current history of left lung cancer, found unresponsive earlier this evening. EXAM: PORTABLE CHEST 1 VIEW COMPARISON:  03/31/2015 and earlier, including CT chest 01/07/2015. FINDINGS: Occlusion of the left mainstem bronchus with complete atelectasis of the left lung and mediastinal shift to the left, unchanged when compared to the 03/31/2015 examination. Since that time, the patient has developed patchy airspace opacities in the base of the remaining right lung. This is superimposed upon changes of COPD and emphysema. IMPRESSION: 1. Acute bronchopneumonia involving the base of the right lung. 2. Occlusion of the left mainstem bronchus with complete atelectasis of the left lung and mediastinal shift to the left, unchanged dating back to at least July, 2016 chest CT. Electronically Signed   By: Evangeline Dakin M.D.   On: 04/21/2015 19:46    Scheduled Meds: . antiseptic oral rinse  7 mL Mouth Rinse q12n4p  . chlorhexidine  15 mL Mouth Rinse BID  . fentaNYL  100 mcg Transdermal Q72H  . ipratropium-albuterol  3 mL Nebulization Q6H   Continuous Infusions: . 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

## 2015-04-25 NOTE — Progress Notes (Signed)
Pt currently not on BIPAP.

## 2015-04-25 NOTE — Progress Notes (Signed)
Met pt briefly; she appeared too weak to verbally respond. She shook her head to respond and had no need for support at the time. Please pg if support is needed. [place]

## 2015-04-26 MED ORDER — SCOPOLAMINE 1 MG/3DAYS TD PT72: 1.0000 | MEDICATED_PATCH | TRANSDERMAL | Status: DC

## 2015-04-27 LAB — URINE CULTURE: Culture: >=100000

## 2015-04-28 LAB — CULTURE, BLOOD (ROUTINE X 2)

## 2015-04-28 LAB — URINE CULTURE: Culture: >=100000

## 2015-05-21 NOTE — Progress Notes (Signed)
Morphine gtt wasted 95 ml, Aldean Baker witnessed.

## 2015-05-21 NOTE — Progress Notes (Signed)
Pt's brother and his wife at bedside. Pt having periods of apnea and is unresponsive. At 0813 pt expired, no heart beat or respirations noted. Second nurse [address] Dr, Broadus John notified and [facility]. [place] donor notified. No other family is expected to come. [facility] to be used. Morphine gtt discontinued,95 ml wasted with Aldean Baker RN.

## 2015-05-21 NOTE — Discharge Summary (Addendum)
Death Summary  [name] RFX:[id number] DOB: Apr 30, 1966 DOA: May 16, 2015  PCP: No primary care provider on file.  Admit date: 2015/05/16 Date of Death: 2015/05/18  Final Diagnoses:  Principal Problem:   Acute respiratory failure ([place]) Active Problems:   Squamous cell lung cancer ([place])   Thrombosis of superior vena cava (HCC)   DNR (do not resuscitate) discussion   Hypokalemia   Hyponatremia   Altered mental status   UTI Shock-likely septic Health care associated pneumonia   History of present illness:  Chief Complaint: Altered mental status.  HPI: [name] is a 49 y.o. female with a past medical history of metastatic lung cancer (not on chemotherapy), IVC thrombosis who was brought to the emergency department via EMS due to change in mental status. Per her brother [name], she is usually able to talk with relatives, get out of bed and sit up, but today she was upstairs in the house and tested him requesting his help. He went upstairs and found her struggling to breathe. He called EMS, while his wife noticed that she had a faint pulse, but was not breathing. She gave her some rescue ventilations and when EMS arrived she had to be Ambu bagged. She also received a DuoNeb treatment and on route to the hospital.  Hospital Course:  1. ACute hypoxic resp failure 2. Progressive Lung CA-metastatic  3. Severe Malnutrition 4. Adult failure to thrive 5. COPD 6. UTI 7. Shock-likely septic 8. Health care associated pneumonia Due to poor prognosis, hospice was recommended by Primary oncologist, after discussion with her brother we decided to focus on comfort only and she was started on morphine gtt and then expired on 2023/05/18  Time: 55mn  Signed:  Bronwen Pendergraft  [facility] 05/12/2015, 4:58 PM

## 2015-05-21 DEATH — deceased

## 2015-10-11 IMAGING — CT CT PELVIS W/O CM
2 of 3 series · 17 of 46 positions shown, 19 images · non-contrast
Comparison: None.

CLINICAL DATA: Evaluate for left acetabulum fracture.

EXAM:
CT PELVIS WITHOUT CONTRAST
TECHNIQUE: Multidetector CT imaging of the pelvis was performed following the
standard protocol without intravenous contrast.

[Series 2: pelvis w/o 5.0 (person_name) · axial · non-contrast · 0.67mm/px · z∈[+648,+918]mm · 14 of 63 slices shown, 16 images]
[im 5/63  soft-tissue]
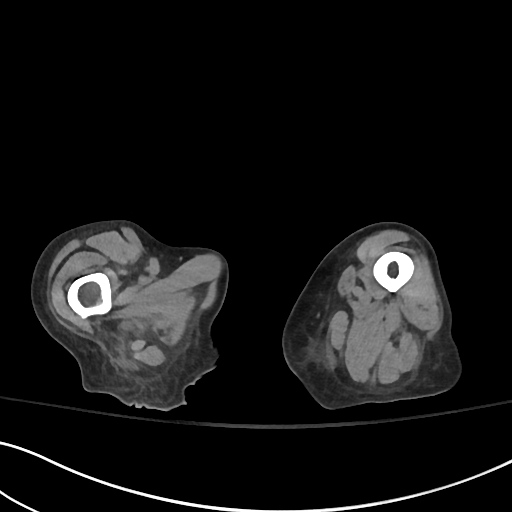
[im 5/63  bone]
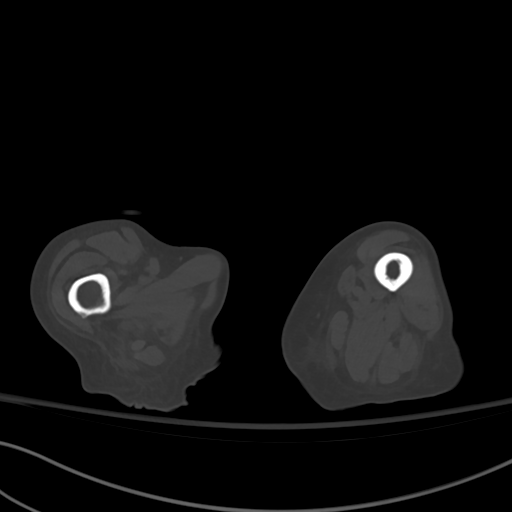
[im 9/63  soft-tissue]
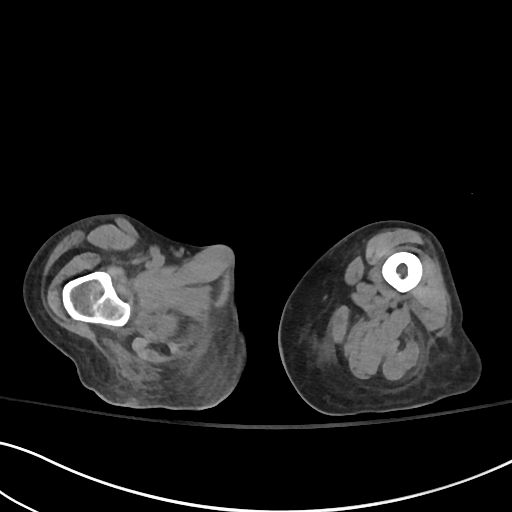
[im 13/63  soft-tissue]
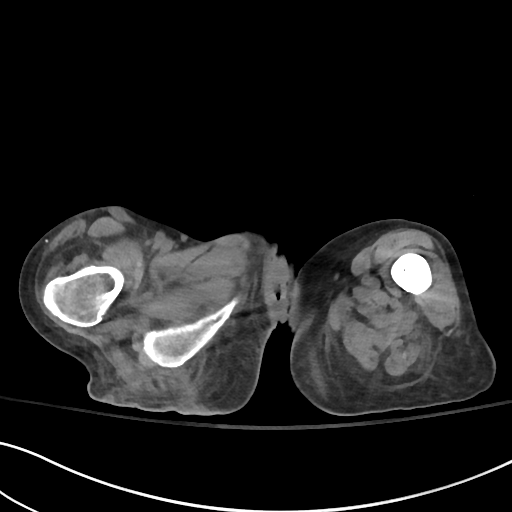
[im 17/63  soft-tissue]
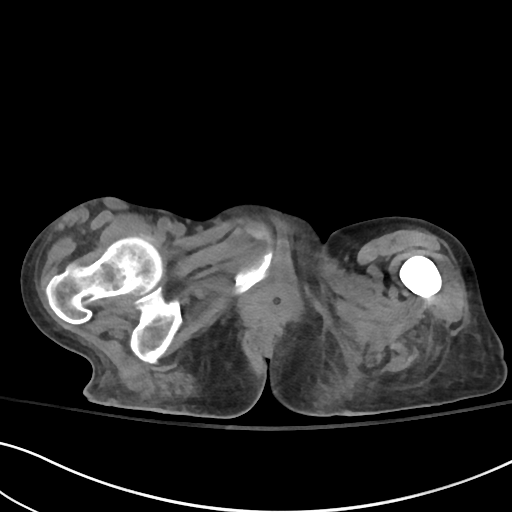
[im 21/63  soft-tissue]
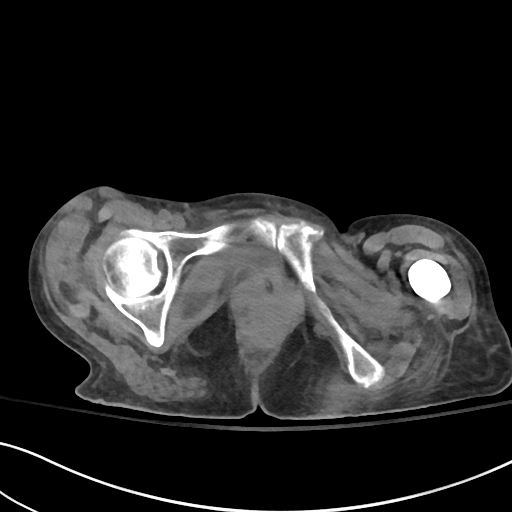
[im 25/63  soft-tissue]
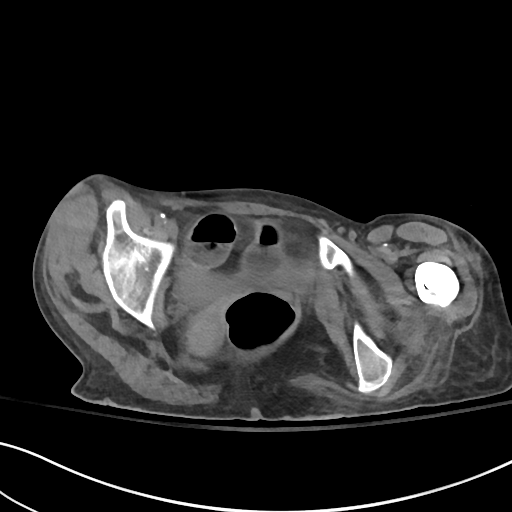
[im 29/63  soft-tissue]
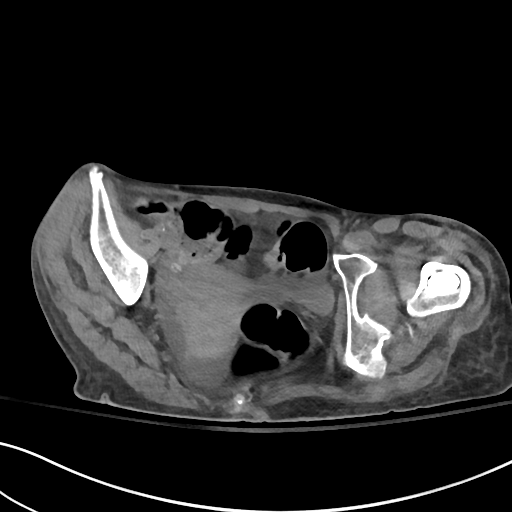
[im 35/63  soft-tissue]
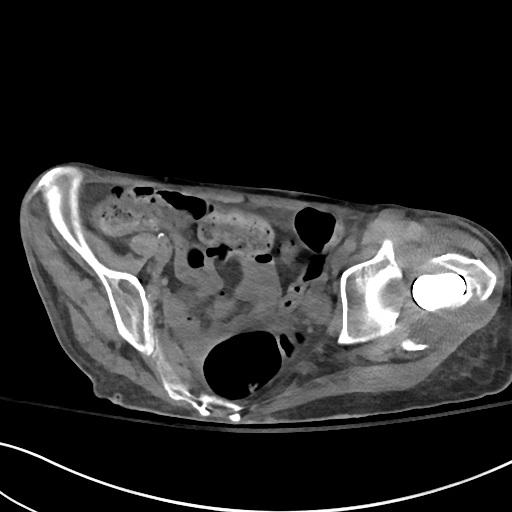
[im 39/63  soft-tissue]
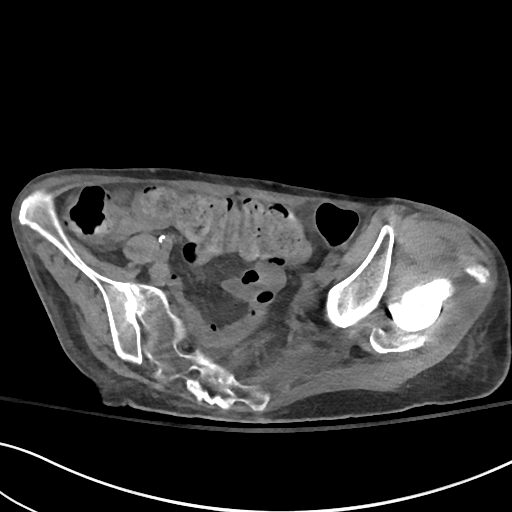
[im 39/63  bone]
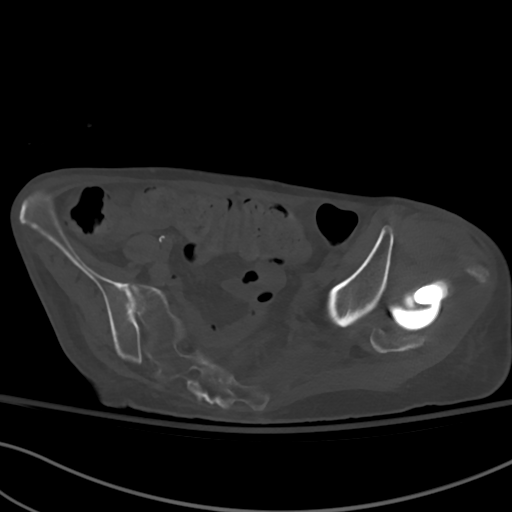
[im 43/63  soft-tissue]
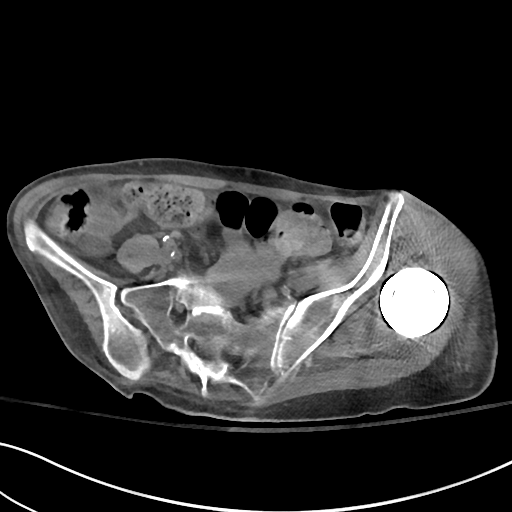
[im 47/63  soft-tissue]
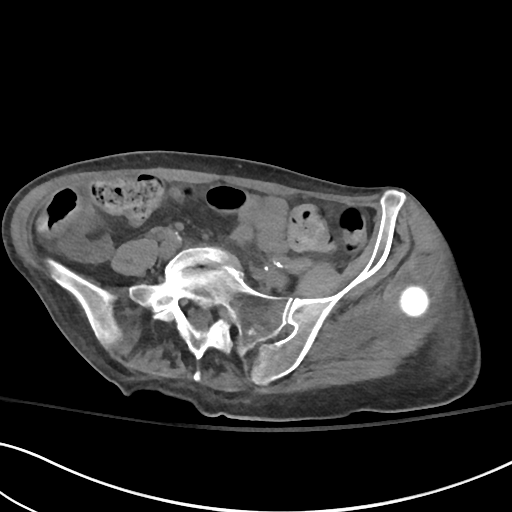
[im 51/63  soft-tissue]
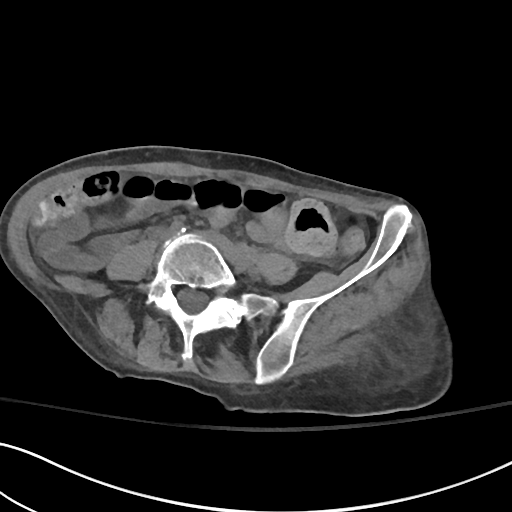
[im 55/63  soft-tissue]
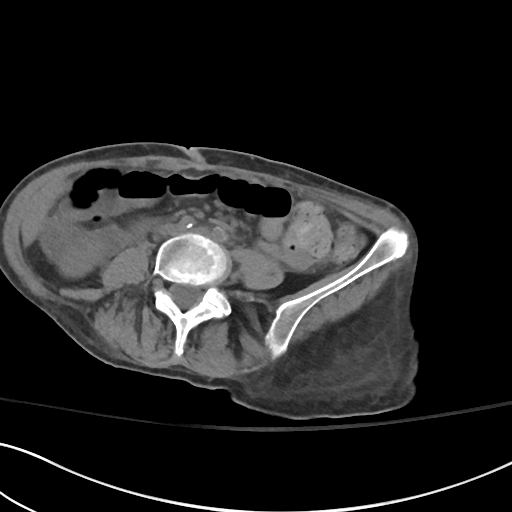
[im 59/63  soft-tissue]
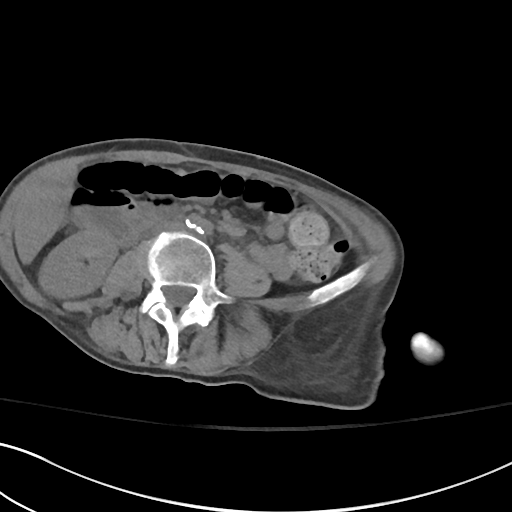

[Series 4: pelvis w/o 2.0 cor · coronal · non-contrast · 0.72mm/px · 3 of 108 slices shown]
[im 36/108  soft-tissue]
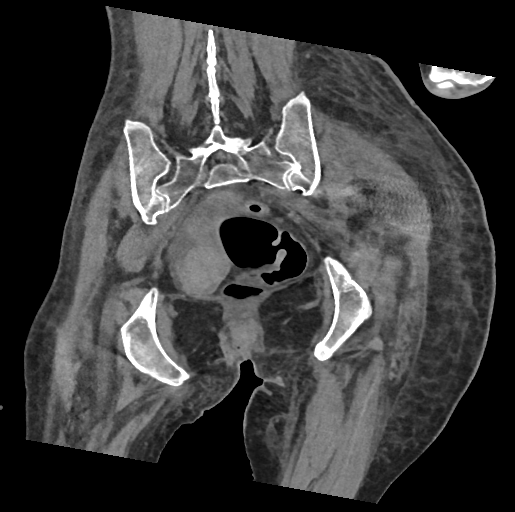
[im 48/108  soft-tissue]
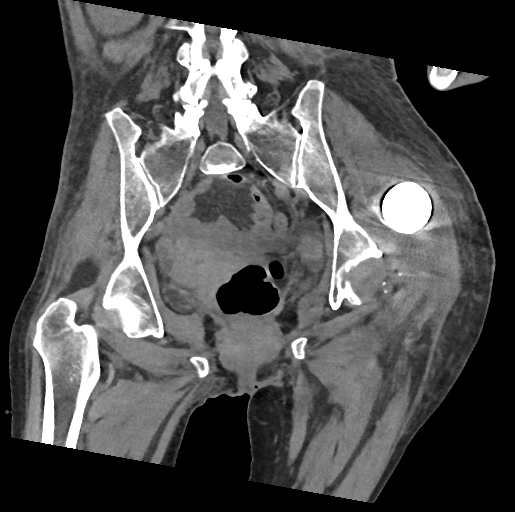
[im 60/108  soft-tissue]
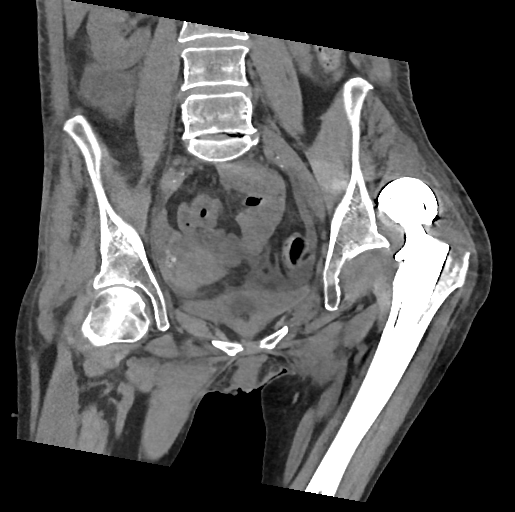

[17 of 46 positions shown; findings below may reference images not displayed]

FINDINGS: The femoral head component of the left hip hardware is dislocated
superiorly and posteriorly to the acetabulum. Dystrophic
calcifications seen just posterior and medial to the femoral head
component appear chronic, presumably related to previous femoral
head/neck fracture or subsequent myositis ossificans.

There is no acute fracture within the left acetabulum or within the
surrounding structures of the left hemipelvis. Sclerotic changes
within the left inferior pubic ramus are suggestive of a previous
healed fracture. No fracture identified within the sacrum or right
hemipelvis. No evidence of an acute fracture within the proximal
left femur at the level of the prosthesis

Small amount of free fluid is present within the pelvis. Soft
tissues of the pelvis are otherwise unremarkable. Ill-defined edema
is present within the subcutaneous soft tissues adjacent to the left
hip without circumscribed hematoma.
IMPRESSION: 1. Femoral head component of the left hip replacement hardware is
displaced superiorly and posteriorly relative to the left
acetabulum.
2. No acute acetabular fracture seen.
3. Several dystrophic-appearing calcifications adjacent to the left
acetabulum and proximal left femur appear chronic and are presumed
to be related to previous femur fracture and/or subsequent myositis
ossificans.
4. Associated ill-defined edema within the subcutaneous soft tissues
lateral to the left hip. No circumscribed fluid collection or
hematoma.
5. Small amount of free fluid within the lower pelvis, most likely
physiologic in nature.

## 2015-10-12 IMAGING — RF DG HIP (WITH PELVIS) OPERATIVE*L*
1 series · 2 of 2 positions shown · non-contrast
Comparison: 03/30/2015

CLINICAL DATA: Revision of left hip arthroplasty

EXAM:
OPERATIVE left HIP (WITH PELVIS IF PERFORMED) 2 VIEWS
TECHNIQUE: Fluoroscopic spot image(s) were submitted for interpretation
post-operatively.

[Series 1: run · 2 of 2 slices shown]
[im 1/2]
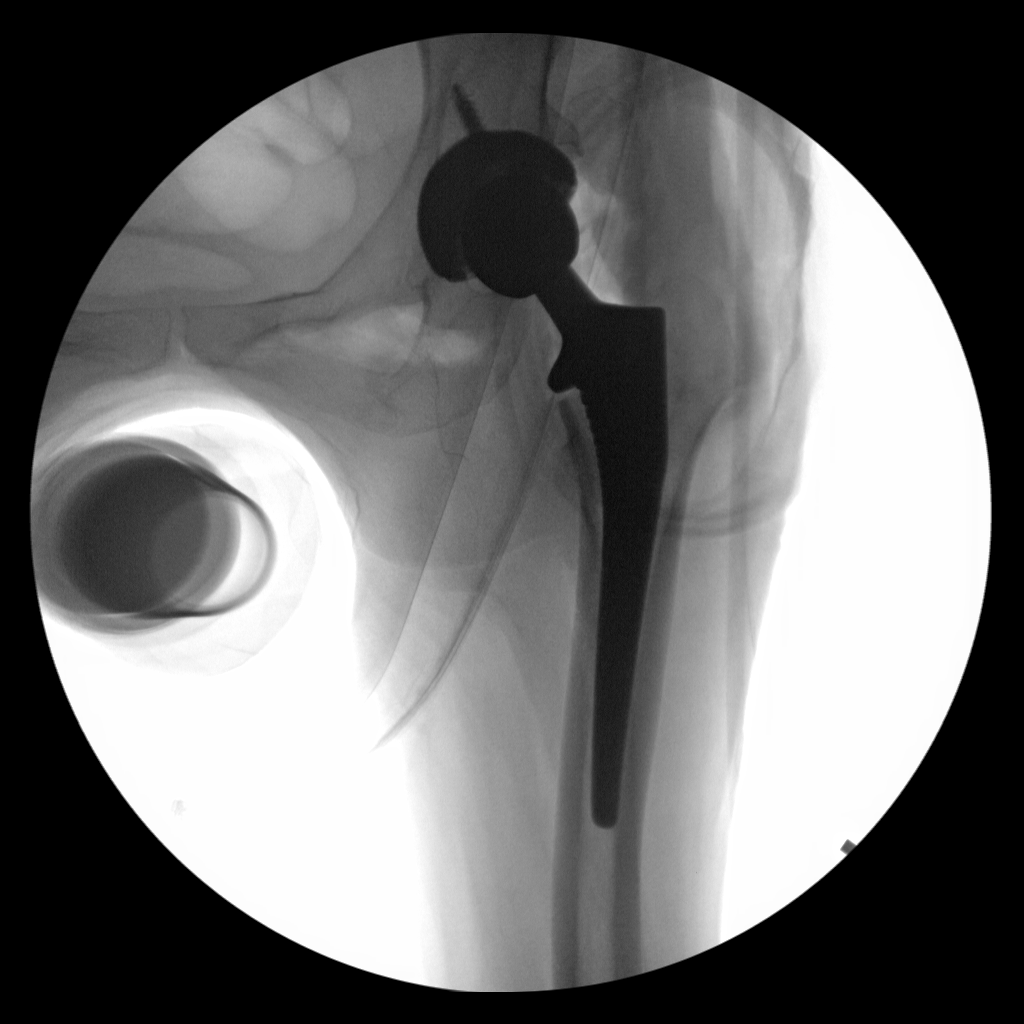
[im 2/2]
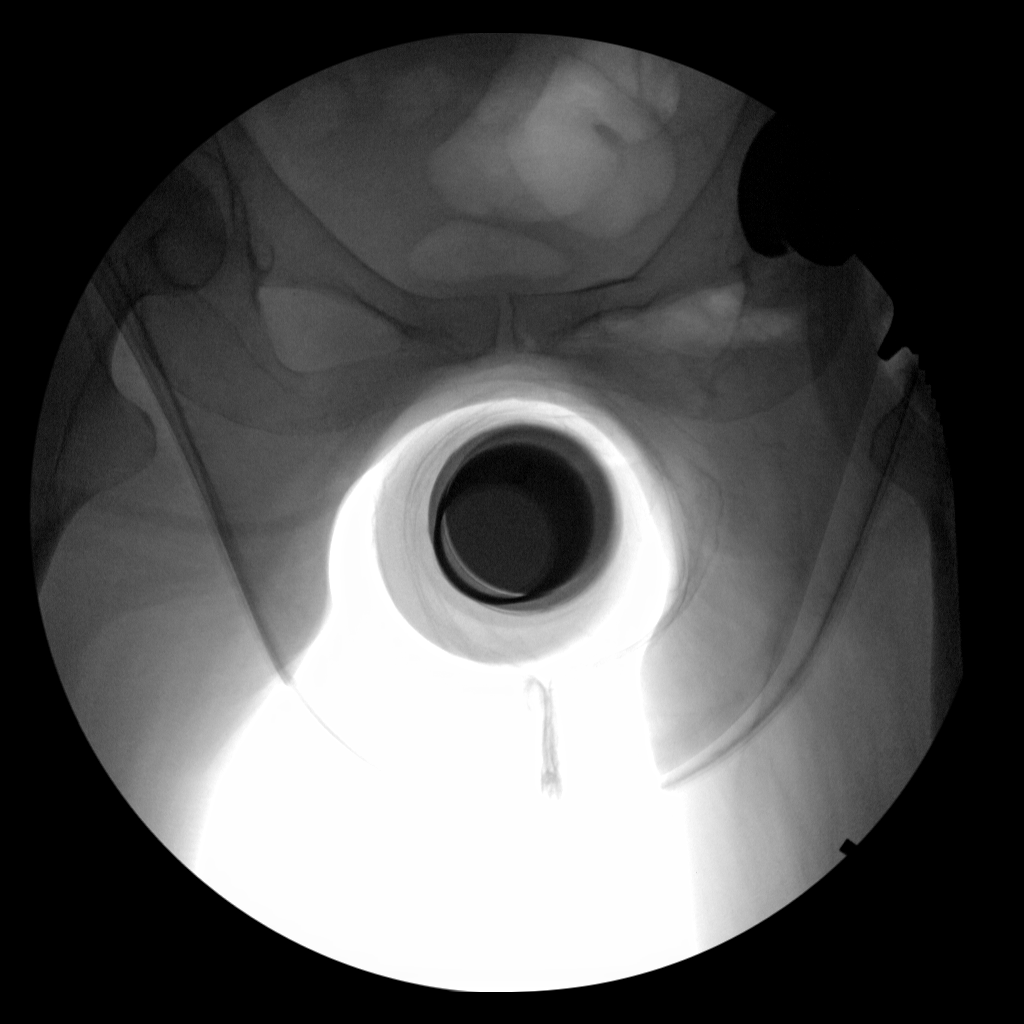

[2 of 2 positions shown; findings below may reference images not displayed]

FINDINGS: There is been revision of the left hip prosthesis. An acetabular
component and femoral component are now seen with fixation screw in
the superior aspect of the acetabulum. No acute bony abnormality is
noted.
IMPRESSION: Status post left hip prosthesis revision

## 2015-10-12 IMAGING — CR DG PORTABLE PELVIS
1 series · 1 of 1 positions shown · non-contrast
Comparison: Prior studies

CLINICAL DATA: Left total hip arthroplasty revision.

EXAM:
PORTABLE PELVIS 1-2 VIEWS

[AP]
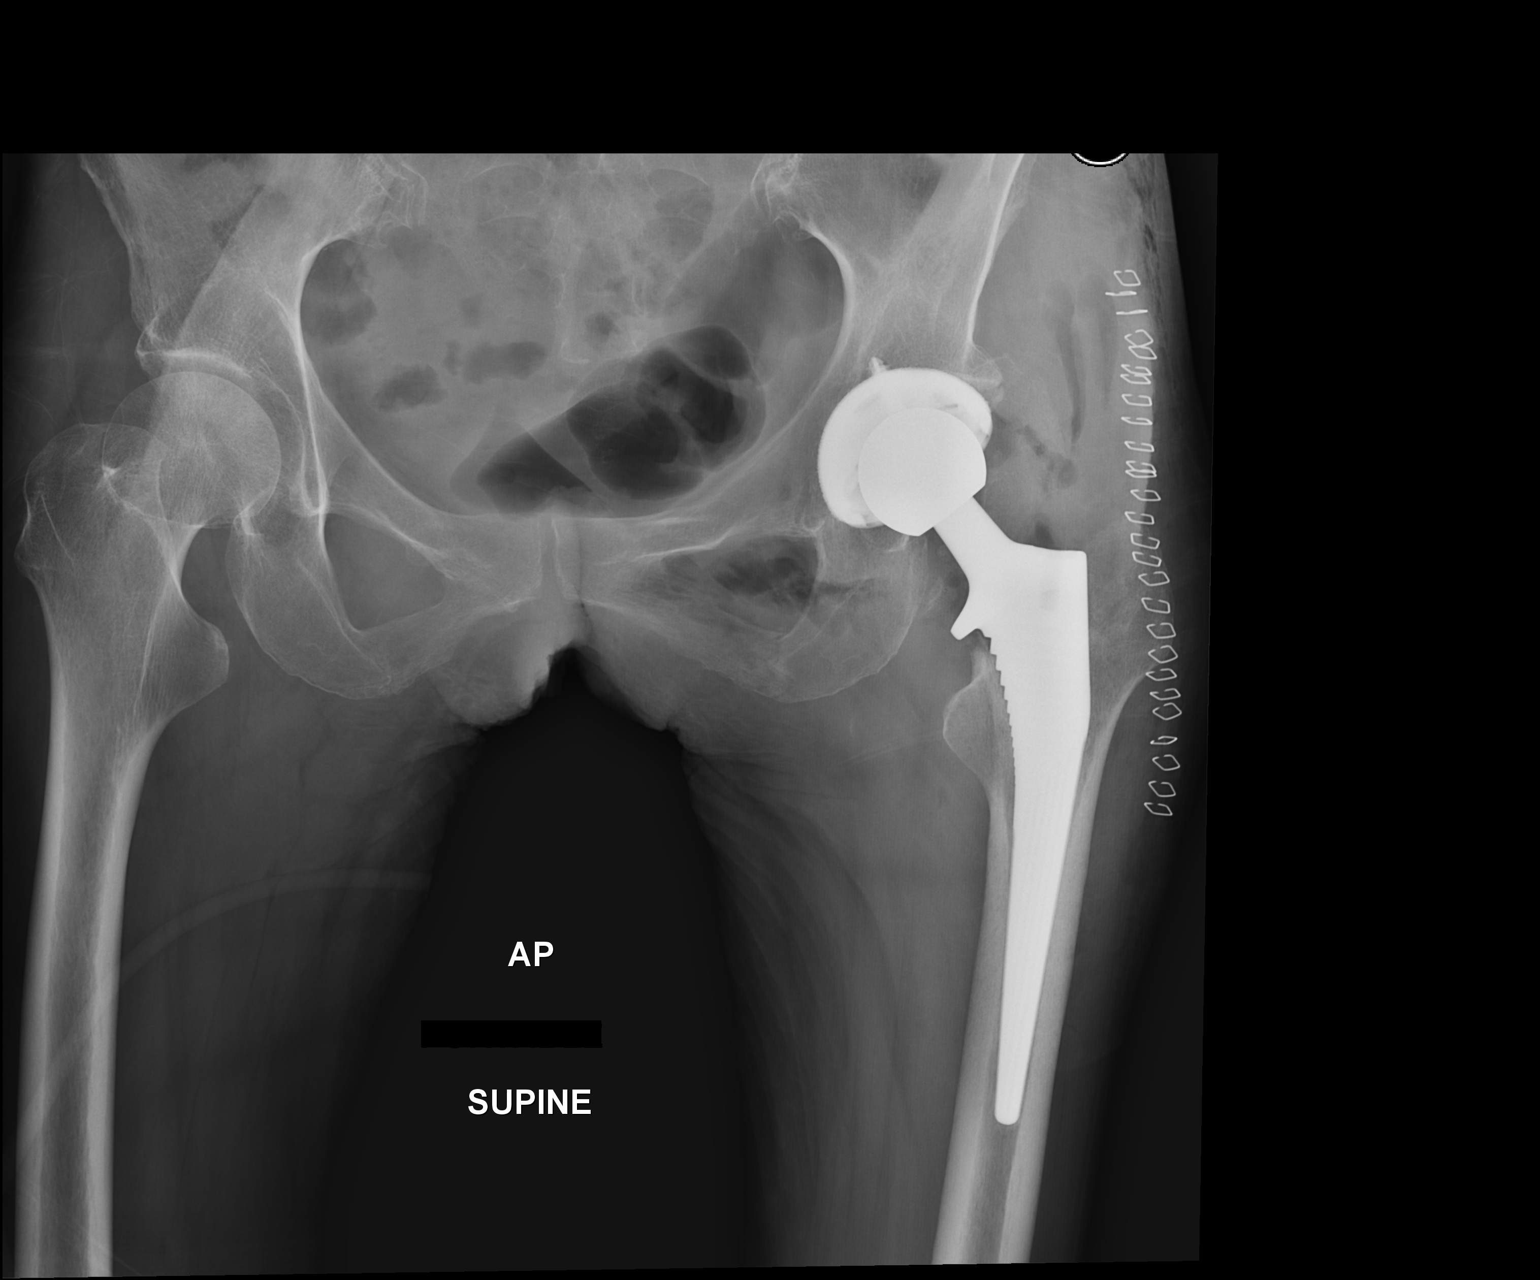

[1 of 1 positions shown; findings below may reference images not displayed]

FINDINGS: Left total hip arthroplasty identified.

No complicating features are noted.

Postoperative changes are present.
IMPRESSION: Left total hip arthroplasty without complicating features.

## 2015-10-12 IMAGING — DX DG CHEST 1V PORT
1 series · 1 of 1 positions shown · non-contrast
Comparison: 02/09/2015

CLINICAL DATA: Increase shortness of Breath, history of stage IV
lung carcinoma

EXAM:
PORTABLE CHEST - 1 VIEW

[chest ap]
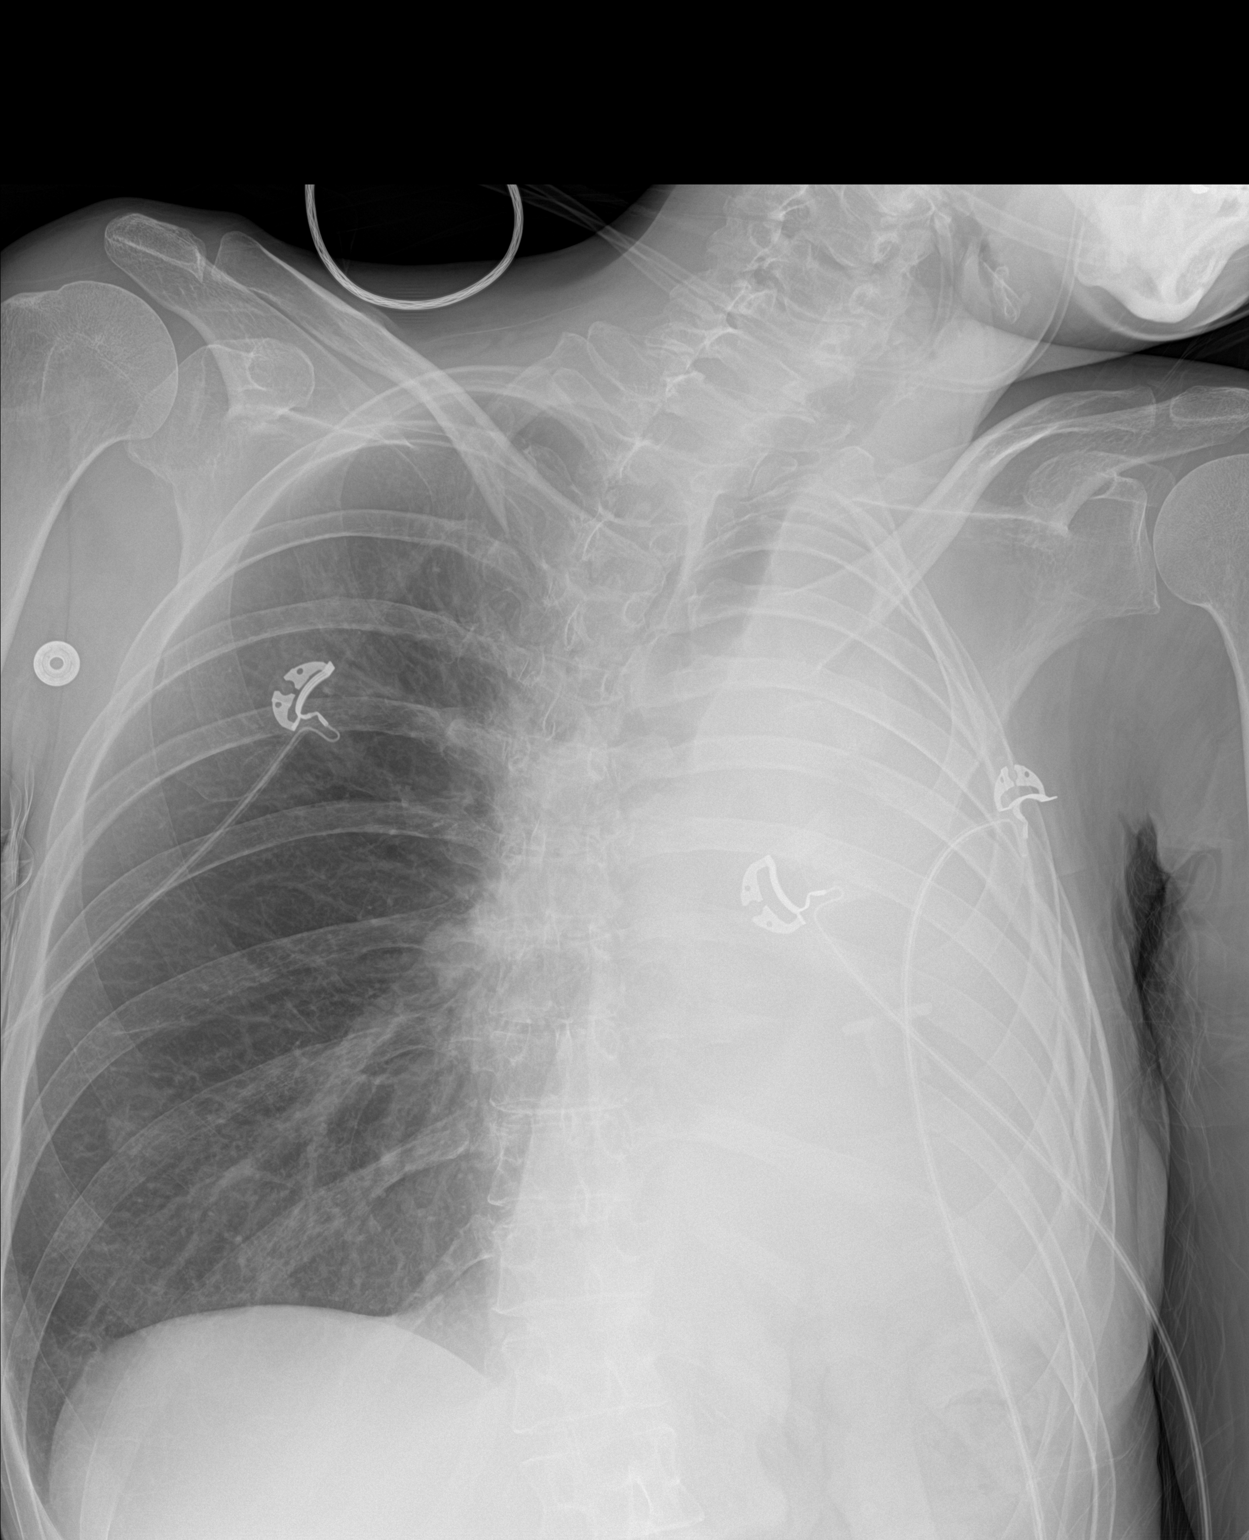

[1 of 1 positions shown; findings below may reference images not displayed]

FINDINGS: There is persistent opacification of the left hemi thorax. The right
lung is well aerated. Nodular density is noted overlying the right
lung base likely representing a nipple shadow. Destruction of the
ninth left rib posteriorly is noted which has progressed in the
interval from the prior exam. The lateral aspect of the left eighth
rib is also destroyed but this is stable from the prior exam.
IMPRESSION: Changes consistent with metastatic disease in the bony structures.

No definitive acute abnormality is seen.

Stable opacification of the left hemi thorax.

## 2015-12-08 ENCOUNTER — Other Ambulatory Visit: Payer: Self-pay | Admitting: Nurse Practitioner
